# Patient Record
Sex: Male | Born: 1945 | Race: White | Hispanic: No | Marital: Single | Smoking: Former smoker
Health system: Southern US, Community
[De-identification: ages and names within clinical notes are randomized; demographics above are authoritative.]

## PROBLEM LIST (undated history)

## (undated) DIAGNOSIS — A498 Other bacterial infections of unspecified site: Secondary | ICD-10-CM

## (undated) DIAGNOSIS — I5030 Unspecified diastolic (congestive) heart failure: Secondary | ICD-10-CM

## (undated) DIAGNOSIS — F32A Depression, unspecified: Secondary | ICD-10-CM

## (undated) DIAGNOSIS — E785 Hyperlipidemia, unspecified: Secondary | ICD-10-CM

## (undated) DIAGNOSIS — E118 Type 2 diabetes mellitus with unspecified complications: Secondary | ICD-10-CM

## (undated) DIAGNOSIS — G4730 Sleep apnea, unspecified: Secondary | ICD-10-CM

## (undated) DIAGNOSIS — I5020 Unspecified systolic (congestive) heart failure: Secondary | ICD-10-CM

## (undated) DIAGNOSIS — R609 Edema, unspecified: Secondary | ICD-10-CM

## (undated) DIAGNOSIS — K2970 Gastritis, unspecified, without bleeding: Secondary | ICD-10-CM

## (undated) DIAGNOSIS — I2510 Atherosclerotic heart disease of native coronary artery without angina pectoris: Secondary | ICD-10-CM

## (undated) DIAGNOSIS — M4722 Other spondylosis with radiculopathy, cervical region: Secondary | ICD-10-CM

## (undated) DIAGNOSIS — F988 Other specified behavioral and emotional disorders with onset usually occurring in childhood and adolescence: Secondary | ICD-10-CM

## (undated) DIAGNOSIS — I951 Orthostatic hypotension: Secondary | ICD-10-CM

## (undated) DIAGNOSIS — K649 Unspecified hemorrhoids: Secondary | ICD-10-CM

## (undated) DIAGNOSIS — R911 Solitary pulmonary nodule: Secondary | ICD-10-CM

## (undated) DIAGNOSIS — R6882 Decreased libido: Secondary | ICD-10-CM

## (undated) DIAGNOSIS — G950 Syringomyelia and syringobulbia: Secondary | ICD-10-CM

## (undated) DIAGNOSIS — I10 Essential (primary) hypertension: Secondary | ICD-10-CM

## (undated) DIAGNOSIS — R000 Tachycardia, unspecified: Secondary | ICD-10-CM

## (undated) DIAGNOSIS — R600 Localized edema: Secondary | ICD-10-CM

## (undated) DIAGNOSIS — K529 Noninfective gastroenteritis and colitis, unspecified: Secondary | ICD-10-CM

## (undated) DIAGNOSIS — I452 Bifascicular block: Secondary | ICD-10-CM

## (undated) DIAGNOSIS — IMO0002 Reserved for concepts with insufficient information to code with codable children: Secondary | ICD-10-CM

## (undated) DIAGNOSIS — E1165 Type 2 diabetes mellitus with hyperglycemia: Secondary | ICD-10-CM

## (undated) DIAGNOSIS — F329 Major depressive disorder, single episode, unspecified: Secondary | ICD-10-CM

## (undated) DIAGNOSIS — G9520 Unspecified cord compression: Secondary | ICD-10-CM

## (undated) HISTORY — DX: Decreased libido: R68.82

## (undated) HISTORY — DX: Unspecified hemorrhoids: K64.9

## (undated) HISTORY — DX: Unspecified cord compression: G95.20

## (undated) HISTORY — DX: Noninfective gastroenteritis and colitis, unspecified: K52.9

## (undated) HISTORY — DX: Hyperlipidemia, unspecified: E78.5

## (undated) HISTORY — DX: Essential (primary) hypertension: I10

## (undated) HISTORY — DX: Gastritis, unspecified, without bleeding: K29.70

## (undated) HISTORY — DX: Other specified behavioral and emotional disorders with onset usually occurring in childhood and adolescence: F98.8

## (undated) HISTORY — DX: Tachycardia, unspecified: R00.0

## (undated) HISTORY — DX: Depression, unspecified: F32.A

## (undated) HISTORY — PX: CARDIAC CATHETERIZATION: SHX172

## (undated) HISTORY — DX: Major depressive disorder, single episode, unspecified: F32.9

## (undated) HISTORY — DX: Orthostatic hypotension: I95.1

## (undated) HISTORY — DX: Syringomyelia and syringobulbia: G95.0

## (undated) HISTORY — DX: Bifascicular block: I45.2

## (undated) HISTORY — DX: Other bacterial infections of unspecified site: A49.8

## (undated) HISTORY — DX: Other spondylosis with radiculopathy, cervical region: M47.22

---

## 1998-12-08 ENCOUNTER — Emergency Department (HOSPITAL_COMMUNITY): Admission: EM | Admit: 1998-12-08 | Discharge: 1998-12-08 | Payer: Self-pay | Admitting: Emergency Medicine

## 1998-12-09 ENCOUNTER — Encounter: Payer: Self-pay | Admitting: Emergency Medicine

## 2001-04-25 ENCOUNTER — Emergency Department (HOSPITAL_COMMUNITY): Admission: EM | Admit: 2001-04-25 | Discharge: 2001-04-26 | Payer: Self-pay | Admitting: Emergency Medicine

## 2001-04-27 ENCOUNTER — Emergency Department (HOSPITAL_COMMUNITY): Admission: EM | Admit: 2001-04-27 | Discharge: 2001-04-27 | Payer: Self-pay

## 2001-04-29 ENCOUNTER — Emergency Department (HOSPITAL_COMMUNITY): Admission: EM | Admit: 2001-04-29 | Discharge: 2001-04-29 | Payer: Self-pay | Admitting: Emergency Medicine

## 2001-05-03 ENCOUNTER — Emergency Department (HOSPITAL_COMMUNITY): Admission: EM | Admit: 2001-05-03 | Discharge: 2001-05-03 | Payer: Self-pay | Admitting: Emergency Medicine

## 2001-08-04 ENCOUNTER — Encounter: Payer: Self-pay | Admitting: Emergency Medicine

## 2001-08-04 ENCOUNTER — Emergency Department (HOSPITAL_COMMUNITY): Admission: EM | Admit: 2001-08-04 | Discharge: 2001-08-04 | Payer: Self-pay | Admitting: Emergency Medicine

## 2001-10-14 ENCOUNTER — Encounter: Payer: Self-pay | Admitting: Family Medicine

## 2001-10-14 ENCOUNTER — Encounter: Admission: RE | Admit: 2001-10-14 | Discharge: 2001-10-14 | Payer: Self-pay | Admitting: Family Medicine

## 2001-12-05 ENCOUNTER — Encounter: Admission: RE | Admit: 2001-12-05 | Discharge: 2001-12-05 | Payer: Self-pay | Admitting: Family Medicine

## 2001-12-05 ENCOUNTER — Encounter: Payer: Self-pay | Admitting: Family Medicine

## 2002-03-27 ENCOUNTER — Emergency Department (HOSPITAL_COMMUNITY): Admission: EM | Admit: 2002-03-27 | Discharge: 2002-03-27 | Payer: Self-pay | Admitting: *Deleted

## 2002-04-20 ENCOUNTER — Emergency Department (HOSPITAL_COMMUNITY): Admission: EM | Admit: 2002-04-20 | Discharge: 2002-04-20 | Payer: Self-pay | Admitting: Emergency Medicine

## 2002-11-08 ENCOUNTER — Emergency Department (HOSPITAL_COMMUNITY): Admission: EM | Admit: 2002-11-08 | Discharge: 2002-11-08 | Payer: Self-pay | Admitting: Emergency Medicine

## 2002-12-08 ENCOUNTER — Encounter: Admission: RE | Admit: 2002-12-08 | Discharge: 2002-12-08 | Payer: Self-pay | Admitting: Internal Medicine

## 2003-01-11 ENCOUNTER — Encounter: Admission: RE | Admit: 2003-01-11 | Discharge: 2003-01-11 | Payer: Self-pay | Admitting: Internal Medicine

## 2003-10-29 ENCOUNTER — Emergency Department (HOSPITAL_COMMUNITY): Admission: EM | Admit: 2003-10-29 | Discharge: 2003-10-29 | Payer: Self-pay | Admitting: Emergency Medicine

## 2003-11-07 ENCOUNTER — Ambulatory Visit (HOSPITAL_COMMUNITY): Admission: RE | Admit: 2003-11-07 | Discharge: 2003-11-07 | Payer: Self-pay | Admitting: Internal Medicine

## 2003-11-07 ENCOUNTER — Encounter: Admission: RE | Admit: 2003-11-07 | Discharge: 2003-11-07 | Payer: Self-pay | Admitting: Internal Medicine

## 2003-11-12 ENCOUNTER — Encounter: Admission: RE | Admit: 2003-11-12 | Discharge: 2003-11-12 | Payer: Self-pay | Admitting: Internal Medicine

## 2003-11-29 ENCOUNTER — Encounter: Admission: RE | Admit: 2003-11-29 | Discharge: 2003-11-29 | Payer: Self-pay | Admitting: Internal Medicine

## 2003-12-13 ENCOUNTER — Encounter: Admission: RE | Admit: 2003-12-13 | Discharge: 2003-12-13 | Payer: Self-pay | Admitting: Internal Medicine

## 2004-01-15 ENCOUNTER — Encounter: Admission: RE | Admit: 2004-01-15 | Discharge: 2004-01-15 | Payer: Self-pay | Admitting: Internal Medicine

## 2004-01-29 ENCOUNTER — Ambulatory Visit (HOSPITAL_COMMUNITY): Admission: RE | Admit: 2004-01-29 | Discharge: 2004-01-29 | Payer: Self-pay | Admitting: Internal Medicine

## 2004-01-30 ENCOUNTER — Ambulatory Visit (HOSPITAL_COMMUNITY): Admission: RE | Admit: 2004-01-30 | Discharge: 2004-01-30 | Payer: Self-pay | Admitting: Internal Medicine

## 2004-02-27 ENCOUNTER — Ambulatory Visit: Payer: Self-pay | Admitting: Internal Medicine

## 2004-02-28 LAB — FECAL OCCULT BLOOD, GUAIAC: Fecal Occult Blood: NEGATIVE

## 2004-03-06 ENCOUNTER — Ambulatory Visit: Payer: Self-pay | Admitting: Internal Medicine

## 2004-03-12 ENCOUNTER — Ambulatory Visit: Payer: Self-pay | Admitting: Internal Medicine

## 2004-03-14 ENCOUNTER — Ambulatory Visit (HOSPITAL_COMMUNITY): Admission: RE | Admit: 2004-03-14 | Discharge: 2004-03-14 | Payer: Self-pay | Admitting: Internal Medicine

## 2004-03-26 ENCOUNTER — Ambulatory Visit (HOSPITAL_COMMUNITY): Admission: RE | Admit: 2004-03-26 | Discharge: 2004-03-26 | Payer: Self-pay | Admitting: Internal Medicine

## 2004-04-10 ENCOUNTER — Encounter: Admission: RE | Admit: 2004-04-10 | Discharge: 2004-04-23 | Payer: Self-pay | Admitting: Internal Medicine

## 2004-04-11 ENCOUNTER — Ambulatory Visit: Payer: Self-pay | Admitting: Internal Medicine

## 2004-04-12 ENCOUNTER — Ambulatory Visit (HOSPITAL_COMMUNITY): Admission: RE | Admit: 2004-04-12 | Discharge: 2004-04-12 | Payer: Self-pay | Admitting: Internal Medicine

## 2004-04-18 ENCOUNTER — Encounter: Admission: RE | Admit: 2004-04-18 | Discharge: 2004-04-18 | Payer: Self-pay | Admitting: Internal Medicine

## 2004-05-06 ENCOUNTER — Ambulatory Visit: Payer: Self-pay | Admitting: Internal Medicine

## 2004-05-11 ENCOUNTER — Encounter: Admission: RE | Admit: 2004-05-11 | Discharge: 2004-05-11 | Payer: Self-pay | Admitting: Internal Medicine

## 2004-06-08 DIAGNOSIS — R000 Tachycardia, unspecified: Secondary | ICD-10-CM

## 2004-06-08 HISTORY — DX: Tachycardia, unspecified: R00.0

## 2004-06-16 ENCOUNTER — Ambulatory Visit: Payer: Self-pay | Admitting: Internal Medicine

## 2004-06-24 ENCOUNTER — Ambulatory Visit: Payer: Self-pay | Admitting: Internal Medicine

## 2004-08-20 ENCOUNTER — Ambulatory Visit: Payer: Self-pay | Admitting: Internal Medicine

## 2004-10-02 ENCOUNTER — Ambulatory Visit: Payer: Self-pay | Admitting: Internal Medicine

## 2004-10-14 ENCOUNTER — Ambulatory Visit (HOSPITAL_COMMUNITY): Admission: RE | Admit: 2004-10-14 | Discharge: 2004-10-14 | Payer: Self-pay | Admitting: Internal Medicine

## 2004-10-14 ENCOUNTER — Encounter (INDEPENDENT_AMBULATORY_CARE_PROVIDER_SITE_OTHER): Payer: Self-pay | Admitting: Cardiology

## 2004-10-16 ENCOUNTER — Ambulatory Visit: Payer: Self-pay | Admitting: Internal Medicine

## 2004-10-24 ENCOUNTER — Ambulatory Visit: Payer: Self-pay | Admitting: Cardiovascular Disease

## 2004-10-28 ENCOUNTER — Ambulatory Visit: Payer: Self-pay | Admitting: Cardiovascular Disease

## 2004-11-17 ENCOUNTER — Ambulatory Visit: Payer: Self-pay | Admitting: Cardiovascular Disease

## 2004-11-17 ENCOUNTER — Inpatient Hospital Stay (HOSPITAL_BASED_OUTPATIENT_CLINIC_OR_DEPARTMENT_OTHER): Admission: RE | Admit: 2004-11-17 | Discharge: 2004-11-17 | Payer: Self-pay | Admitting: Cardiovascular Disease

## 2004-12-08 ENCOUNTER — Ambulatory Visit: Payer: Self-pay

## 2005-01-21 ENCOUNTER — Ambulatory Visit: Payer: Self-pay | Admitting: Internal Medicine

## 2005-02-28 ENCOUNTER — Encounter (INDEPENDENT_AMBULATORY_CARE_PROVIDER_SITE_OTHER): Payer: Self-pay | Admitting: Internal Medicine

## 2005-03-03 ENCOUNTER — Ambulatory Visit: Payer: Self-pay | Admitting: Internal Medicine

## 2005-09-07 ENCOUNTER — Encounter (INDEPENDENT_AMBULATORY_CARE_PROVIDER_SITE_OTHER): Payer: Self-pay | Admitting: Internal Medicine

## 2005-09-07 ENCOUNTER — Ambulatory Visit: Payer: Self-pay | Admitting: Hospitalist

## 2005-10-15 ENCOUNTER — Ambulatory Visit: Payer: Self-pay | Admitting: Internal Medicine

## 2005-11-04 ENCOUNTER — Ambulatory Visit: Payer: Self-pay | Admitting: Internal Medicine

## 2005-12-24 ENCOUNTER — Ambulatory Visit (HOSPITAL_COMMUNITY): Admission: RE | Admit: 2005-12-24 | Discharge: 2005-12-24 | Payer: Self-pay | Admitting: Internal Medicine

## 2005-12-24 ENCOUNTER — Ambulatory Visit: Payer: Self-pay | Admitting: Internal Medicine

## 2006-03-01 ENCOUNTER — Ambulatory Visit: Payer: Self-pay | Admitting: Internal Medicine

## 2006-04-20 DIAGNOSIS — F329 Major depressive disorder, single episode, unspecified: Secondary | ICD-10-CM

## 2006-04-20 DIAGNOSIS — E785 Hyperlipidemia, unspecified: Secondary | ICD-10-CM

## 2006-04-20 DIAGNOSIS — E1165 Type 2 diabetes mellitus with hyperglycemia: Secondary | ICD-10-CM

## 2006-04-20 DIAGNOSIS — I10 Essential (primary) hypertension: Secondary | ICD-10-CM | POA: Insufficient documentation

## 2006-06-09 DIAGNOSIS — M5412 Radiculopathy, cervical region: Secondary | ICD-10-CM | POA: Insufficient documentation

## 2006-06-09 DIAGNOSIS — I452 Bifascicular block: Secondary | ICD-10-CM | POA: Insufficient documentation

## 2006-10-06 ENCOUNTER — Telehealth: Payer: Self-pay | Admitting: *Deleted

## 2006-10-06 ENCOUNTER — Encounter (INDEPENDENT_AMBULATORY_CARE_PROVIDER_SITE_OTHER): Payer: Self-pay | Admitting: Internal Medicine

## 2006-10-06 ENCOUNTER — Telehealth (INDEPENDENT_AMBULATORY_CARE_PROVIDER_SITE_OTHER): Payer: Self-pay | Admitting: *Deleted

## 2006-10-07 ENCOUNTER — Telehealth (INDEPENDENT_AMBULATORY_CARE_PROVIDER_SITE_OTHER): Payer: Self-pay | Admitting: *Deleted

## 2006-10-08 ENCOUNTER — Encounter (INDEPENDENT_AMBULATORY_CARE_PROVIDER_SITE_OTHER): Payer: Self-pay | Admitting: Internal Medicine

## 2006-10-08 ENCOUNTER — Ambulatory Visit: Payer: Self-pay | Admitting: *Deleted

## 2006-10-11 ENCOUNTER — Encounter (INDEPENDENT_AMBULATORY_CARE_PROVIDER_SITE_OTHER): Payer: Self-pay | Admitting: Internal Medicine

## 2006-10-14 DIAGNOSIS — R17 Unspecified jaundice: Secondary | ICD-10-CM | POA: Insufficient documentation

## 2006-10-14 LAB — CONVERTED CEMR LAB
ALT: 30 units/L (ref 0–53)
AST: 27 units/L (ref 0–37)
Albumin: 4.2 g/dL (ref 3.5–5.2)
Alkaline Phosphatase: 80 units/L (ref 39–117)
BUN: 16 mg/dL (ref 6–23)
Bilirubin, Direct: 0.3 mg/dL (ref 0.0–0.3)
CO2: 26 meq/L (ref 19–32)
Calcium: 9.7 mg/dL (ref 8.4–10.5)
Chloride: 101 meq/L (ref 96–112)
Cholesterol: 207 mg/dL — ABNORMAL HIGH (ref 0–200)
Creatinine, Ser: 0.82 mg/dL (ref 0.40–1.50)
Glucose, Bld: 245 mg/dL — ABNORMAL HIGH (ref 70–99)
HDL: 35 mg/dL — ABNORMAL LOW (ref >39)
Indirect Bilirubin: 1.7 mg/dL — ABNORMAL HIGH (ref 0.0–0.9)
LDL Cholesterol: 100 mg/dL — ABNORMAL HIGH (ref 0–99)
Potassium: 4.6 meq/L (ref 3.5–5.3)
Sodium: 140 meq/L (ref 135–145)
Total Bilirubin: 2 mg/dL — ABNORMAL HIGH (ref 0.3–1.2)
Total CHOL/HDL Ratio: 5.9
Total Protein: 6.9 g/dL (ref 6.0–8.3)
Triglycerides: 362 mg/dL — ABNORMAL HIGH (ref <150)
VLDL: 72 mg/dL — ABNORMAL HIGH (ref 0–40)

## 2006-10-22 ENCOUNTER — Ambulatory Visit: Payer: Self-pay | Admitting: Hospitalist

## 2006-10-25 ENCOUNTER — Telehealth (INDEPENDENT_AMBULATORY_CARE_PROVIDER_SITE_OTHER): Payer: Self-pay | Admitting: Internal Medicine

## 2006-11-03 ENCOUNTER — Telehealth: Payer: Self-pay | Admitting: *Deleted

## 2006-11-26 ENCOUNTER — Ambulatory Visit: Payer: Self-pay | Admitting: Internal Medicine

## 2006-11-26 ENCOUNTER — Encounter (INDEPENDENT_AMBULATORY_CARE_PROVIDER_SITE_OTHER): Payer: Self-pay | Admitting: Internal Medicine

## 2006-11-26 DIAGNOSIS — R6882 Decreased libido: Secondary | ICD-10-CM

## 2006-11-26 LAB — CONVERTED CEMR LAB
Blood Glucose, Fingerstick: 364
FSH: 5 milliintl units/mL (ref 1.4–18.1)
Hgb A1c MFr Bld: 10.4 %
LH: 8.5 milliintl units/mL (ref 1.5–9.3)
Testosterone: 354.55 ng/dL (ref 350–890)

## 2006-11-29 ENCOUNTER — Telehealth: Payer: Self-pay | Admitting: *Deleted

## 2006-12-20 ENCOUNTER — Ambulatory Visit: Payer: Self-pay | Admitting: *Deleted

## 2006-12-20 LAB — CONVERTED CEMR LAB: Blood Glucose, Fingerstick: 365

## 2007-02-15 ENCOUNTER — Ambulatory Visit: Payer: Self-pay | Admitting: Internal Medicine

## 2007-02-15 ENCOUNTER — Encounter (INDEPENDENT_AMBULATORY_CARE_PROVIDER_SITE_OTHER): Payer: Self-pay | Admitting: *Deleted

## 2007-02-15 LAB — CONVERTED CEMR LAB
Blood Glucose, Fingerstick: 367
Hgb A1c MFr Bld: 11.3 %

## 2007-02-16 LAB — CONVERTED CEMR LAB
BUN: 16 mg/dL (ref 6–23)
CO2: 24 meq/L (ref 19–32)
Calcium: 9.7 mg/dL (ref 8.4–10.5)
Chloride: 99 meq/L (ref 96–112)
Creatinine, Ser: 0.89 mg/dL (ref 0.40–1.50)
Creatinine, Urine: 166 mg/dL
Glucose, Bld: 322 mg/dL — ABNORMAL HIGH (ref 70–99)
Microalb Creat Ratio: 37.2 mg/g — ABNORMAL HIGH (ref 0.0–30.0)
Microalb, Ur: 6.17 mg/dL — ABNORMAL HIGH (ref 0.00–1.89)
Potassium: 4.5 meq/L (ref 3.5–5.3)
Sodium: 137 meq/L (ref 135–145)

## 2007-03-08 ENCOUNTER — Encounter (INDEPENDENT_AMBULATORY_CARE_PROVIDER_SITE_OTHER): Payer: Self-pay | Admitting: *Deleted

## 2007-03-08 ENCOUNTER — Ambulatory Visit: Payer: Self-pay | Admitting: Hospitalist

## 2007-03-08 LAB — CONVERTED CEMR LAB: Blood Glucose, Fingerstick: 294

## 2007-03-23 ENCOUNTER — Telehealth: Payer: Self-pay | Admitting: Internal Medicine

## 2007-03-30 ENCOUNTER — Telehealth (INDEPENDENT_AMBULATORY_CARE_PROVIDER_SITE_OTHER): Payer: Self-pay | Admitting: *Deleted

## 2007-06-23 ENCOUNTER — Ambulatory Visit: Payer: Self-pay | Admitting: Internal Medicine

## 2007-06-23 ENCOUNTER — Encounter (INDEPENDENT_AMBULATORY_CARE_PROVIDER_SITE_OTHER): Payer: Self-pay | Admitting: Internal Medicine

## 2007-06-23 LAB — CONVERTED CEMR LAB: Hgb A1c MFr Bld: 10.5 %

## 2007-06-27 ENCOUNTER — Telehealth: Payer: Self-pay | Admitting: *Deleted

## 2007-06-28 LAB — CONVERTED CEMR LAB
ALT: 32 units/L (ref 0–53)
AST: 21 units/L (ref 0–37)
Albumin: 4.3 g/dL (ref 3.5–5.2)
Alkaline Phosphatase: 95 units/L (ref 39–117)
BUN: 12 mg/dL (ref 6–23)
Basophils Absolute: 0 10*3/uL (ref 0.0–0.1)
Basophils Relative: 0 % (ref 0–1)
CO2: 27 meq/L (ref 19–32)
Calcium: 9.7 mg/dL (ref 8.4–10.5)
Chloride: 99 meq/L (ref 96–112)
Creatinine, Ser: 0.88 mg/dL (ref 0.40–1.50)
Eosinophils Absolute: 0.2 10*3/uL (ref 0.0–0.7)
Eosinophils Relative: 3 % (ref 0–5)
Glucose, Bld: 355 mg/dL — ABNORMAL HIGH (ref 70–99)
HCT: 49.7 % (ref 39.0–52.0)
Hemoglobin: 17.5 g/dL — ABNORMAL HIGH (ref 13.0–17.0)
Lymphocytes Relative: 30 % (ref 12–46)
Lymphs Abs: 2.1 10*3/uL (ref 0.7–4.0)
MCHC: 35.2 g/dL (ref 30.0–36.0)
MCV: 85.1 fL (ref 78.0–100.0)
Monocytes Absolute: 0.7 10*3/uL (ref 0.1–1.0)
Monocytes Relative: 10 % (ref 3–12)
Neutro Abs: 3.9 10*3/uL (ref 1.7–7.7)
Neutrophils Relative %: 57 % (ref 43–77)
PSA: 1.21 ng/mL (ref 0.10–4.00)
Platelets: 209 10*3/uL (ref 150–400)
Potassium: 4.8 meq/L (ref 3.5–5.3)
RBC: 5.84 M/uL — ABNORMAL HIGH (ref 4.22–5.81)
RDW: 13.7 % (ref 11.5–15.5)
Sodium: 137 meq/L (ref 135–145)
Total Bilirubin: 1.5 mg/dL — ABNORMAL HIGH (ref 0.3–1.2)
Total Protein: 7.1 g/dL (ref 6.0–8.3)
WBC: 6.9 10*3/uL (ref 4.0–10.5)

## 2007-07-06 ENCOUNTER — Telehealth (INDEPENDENT_AMBULATORY_CARE_PROVIDER_SITE_OTHER): Payer: Self-pay | Admitting: Internal Medicine

## 2007-09-12 ENCOUNTER — Telehealth: Payer: Self-pay | Admitting: *Deleted

## 2007-12-06 ENCOUNTER — Telehealth: Payer: Self-pay | Admitting: *Deleted

## 2008-01-27 ENCOUNTER — Encounter (INDEPENDENT_AMBULATORY_CARE_PROVIDER_SITE_OTHER): Payer: Self-pay | Admitting: Internal Medicine

## 2008-01-27 ENCOUNTER — Ambulatory Visit: Payer: Self-pay | Admitting: Internal Medicine

## 2008-01-27 LAB — CONVERTED CEMR LAB
Blood Glucose, Fingerstick: 412
Hgb A1c MFr Bld: 11.1 %

## 2008-02-06 ENCOUNTER — Telehealth (INDEPENDENT_AMBULATORY_CARE_PROVIDER_SITE_OTHER): Payer: Self-pay | Admitting: Internal Medicine

## 2008-02-07 ENCOUNTER — Telehealth (INDEPENDENT_AMBULATORY_CARE_PROVIDER_SITE_OTHER): Payer: Self-pay | Admitting: Internal Medicine

## 2008-02-08 ENCOUNTER — Ambulatory Visit: Payer: Self-pay | Admitting: Internal Medicine

## 2008-02-21 ENCOUNTER — Telehealth (INDEPENDENT_AMBULATORY_CARE_PROVIDER_SITE_OTHER): Payer: Self-pay | Admitting: Internal Medicine

## 2008-03-28 ENCOUNTER — Ambulatory Visit: Payer: Self-pay | Admitting: Internal Medicine

## 2008-03-28 ENCOUNTER — Encounter (INDEPENDENT_AMBULATORY_CARE_PROVIDER_SITE_OTHER): Payer: Self-pay | Admitting: Internal Medicine

## 2008-03-28 DIAGNOSIS — F988 Other specified behavioral and emotional disorders with onset usually occurring in childhood and adolescence: Secondary | ICD-10-CM | POA: Insufficient documentation

## 2008-03-28 LAB — CONVERTED CEMR LAB: Hgb A1c MFr Bld: 9.9 %

## 2008-03-30 LAB — CONVERTED CEMR LAB
ALT: 25 units/L (ref 0–53)
AST: 18 units/L (ref 0–37)
Albumin: 4 g/dL (ref 3.5–5.2)
Alkaline Phosphatase: 72 units/L (ref 39–117)
BUN: 15 mg/dL (ref 6–23)
CO2: 24 meq/L (ref 19–32)
Calcium: 9.1 mg/dL (ref 8.4–10.5)
Chloride: 104 meq/L (ref 96–112)
Creatinine, Ser: 0.74 mg/dL (ref 0.40–1.50)
Glucose, Bld: 254 mg/dL — ABNORMAL HIGH (ref 70–99)
HCT: 45.2 % (ref 39.0–52.0)
Hemoglobin: 15.8 g/dL (ref 13.0–17.0)
MCHC: 35 g/dL (ref 30.0–36.0)
MCV: 85.1 fL (ref 78.0–100.0)
Platelets: 188 10*3/uL (ref 150–400)
Potassium: 4.1 meq/L (ref 3.5–5.3)
RBC: 5.31 M/uL (ref 4.22–5.81)
RDW: 13.5 % (ref 11.5–15.5)
Sodium: 138 meq/L (ref 135–145)
Total Bilirubin: 1.5 mg/dL — ABNORMAL HIGH (ref 0.3–1.2)
Total Protein: 6.4 g/dL (ref 6.0–8.3)
Vit D, 1,25-Dihydroxy: 20 — ABNORMAL LOW (ref 30–89)
WBC: 6.7 10*3/uL (ref 4.0–10.5)

## 2008-04-09 ENCOUNTER — Encounter (INDEPENDENT_AMBULATORY_CARE_PROVIDER_SITE_OTHER): Payer: Self-pay | Admitting: Internal Medicine

## 2008-08-08 ENCOUNTER — Ambulatory Visit: Payer: Self-pay | Admitting: Internal Medicine

## 2008-08-10 ENCOUNTER — Encounter (INDEPENDENT_AMBULATORY_CARE_PROVIDER_SITE_OTHER): Payer: Self-pay | Admitting: Internal Medicine

## 2008-08-10 ENCOUNTER — Ambulatory Visit: Payer: Self-pay | Admitting: Internal Medicine

## 2008-08-10 DIAGNOSIS — J029 Acute pharyngitis, unspecified: Secondary | ICD-10-CM

## 2008-08-10 LAB — CONVERTED CEMR LAB
BUN: 16 mg/dL (ref 6–23)
CO2: 25 meq/L (ref 19–32)
Calcium: 9.7 mg/dL (ref 8.4–10.5)
Chloride: 103 meq/L (ref 96–112)
Creatinine, Ser: 0.74 mg/dL (ref 0.40–1.50)
Glucose, Bld: 297 mg/dL — ABNORMAL HIGH (ref 70–99)
Potassium: 4.6 meq/L (ref 3.5–5.3)
Sodium: 142 meq/L (ref 135–145)
Streptococcus, Group A Screen (Direct): NEGATIVE

## 2008-09-28 ENCOUNTER — Telehealth: Payer: Self-pay | Admitting: *Deleted

## 2008-10-03 ENCOUNTER — Encounter (INDEPENDENT_AMBULATORY_CARE_PROVIDER_SITE_OTHER): Payer: Self-pay | Admitting: Internal Medicine

## 2008-10-03 LAB — HM DIABETES EYE EXAM

## 2009-05-13 ENCOUNTER — Telehealth (INDEPENDENT_AMBULATORY_CARE_PROVIDER_SITE_OTHER): Payer: Self-pay | Admitting: *Deleted

## 2009-06-21 DIAGNOSIS — I951 Orthostatic hypotension: Secondary | ICD-10-CM

## 2009-06-21 DIAGNOSIS — I498 Other specified cardiac arrhythmias: Secondary | ICD-10-CM | POA: Insufficient documentation

## 2009-06-26 ENCOUNTER — Ambulatory Visit: Payer: Self-pay | Admitting: Internal Medicine

## 2009-07-09 ENCOUNTER — Ambulatory Visit: Payer: Self-pay | Admitting: Internal Medicine

## 2009-07-09 ENCOUNTER — Encounter: Payer: Self-pay | Admitting: Internal Medicine

## 2009-07-09 ENCOUNTER — Ambulatory Visit: Payer: Self-pay

## 2009-07-09 ENCOUNTER — Ambulatory Visit (HOSPITAL_COMMUNITY): Admission: RE | Admit: 2009-07-09 | Discharge: 2009-07-09 | Payer: Self-pay | Admitting: Internal Medicine

## 2009-07-11 LAB — CONVERTED CEMR LAB
Basophils Absolute: 0 10*3/uL (ref 0.0–0.1)
Basophils Relative: 0.7 % (ref 0.0–3.0)
Cholesterol: 190 mg/dL (ref 0–200)
Direct LDL: 96.5 mg/dL
Eosinophils Absolute: 0.2 10*3/uL (ref 0.0–0.7)
Eosinophils Relative: 4.8 % (ref 0.0–5.0)
HCT: 47.4 % (ref 39.0–52.0)
HDL: 39.7 mg/dL (ref >39.00)
Hemoglobin: 16.2 g/dL (ref 13.0–17.0)
Hgb A1c MFr Bld: 10.2 % — ABNORMAL HIGH (ref 4.6–6.5)
Lymphocytes Relative: 26.8 % (ref 12.0–46.0)
Lymphs Abs: 1.4 10*3/uL (ref 0.7–4.0)
MCHC: 34.2 g/dL (ref 30.0–36.0)
MCV: 87.3 fL (ref 78.0–100.0)
Monocytes Absolute: 0.4 10*3/uL (ref 0.1–1.0)
Monocytes Relative: 8.5 % (ref 3.0–12.0)
Neutro Abs: 3.2 10*3/uL (ref 1.4–7.7)
Neutrophils Relative %: 59.2 % (ref 43.0–77.0)
Platelets: 169 10*3/uL (ref 150.0–400.0)
RBC: 5.44 M/uL (ref 4.22–5.81)
RDW: 12.7 % (ref 11.5–14.6)
TSH: 1.35 microintl units/mL (ref 0.35–5.50)
Total CHOL/HDL Ratio: 5
Triglycerides: 355 mg/dL — ABNORMAL HIGH (ref 0.0–149.0)
VLDL: 71 mg/dL — ABNORMAL HIGH (ref 0.0–40.0)
WBC: 5.2 10*3/uL (ref 4.5–10.5)

## 2009-08-22 ENCOUNTER — Telehealth (INDEPENDENT_AMBULATORY_CARE_PROVIDER_SITE_OTHER): Payer: Self-pay | Admitting: *Deleted

## 2009-09-18 ENCOUNTER — Telehealth: Payer: Self-pay | Admitting: Internal Medicine

## 2009-09-23 ENCOUNTER — Ambulatory Visit: Payer: Self-pay | Admitting: Internal Medicine

## 2009-09-23 LAB — CONVERTED CEMR LAB

## 2009-10-29 ENCOUNTER — Ambulatory Visit: Payer: Self-pay | Admitting: Infectious Disease

## 2009-10-29 DIAGNOSIS — G4700 Insomnia, unspecified: Secondary | ICD-10-CM

## 2009-10-29 LAB — CONVERTED CEMR LAB
ALT: 17 U/L
AST: 16 U/L
Albumin: 4.1 g/dL
Alkaline Phosphatase: 75 U/L
BUN: 19 mg/dL
Bilirubin, Direct: 0.2 mg/dL
Blood Glucose, Fingerstick: 434
CO2: 25 meq/L
Calcium: 10.3 mg/dL
Chloride: 97 meq/L
Creatinine, Ser: 1.02 mg/dL
Creatinine, Urine: 76.9 mg/dL
Glucose, Bld: 353 mg/dL — ABNORMAL HIGH
Hgb A1c MFr Bld: 10.4 %
Indirect Bilirubin: 1.8 mg/dL — ABNORMAL HIGH
Microalb Creat Ratio: 105.2 mg/g — ABNORMAL HIGH
Microalb, Ur: 8.09 mg/dL — ABNORMAL HIGH
PSA: 1.66 ng/mL
Potassium: 4.5 meq/L
Sodium: 136 meq/L
Total Bilirubin: 2 mg/dL — ABNORMAL HIGH
Total Protein: 6.7 g/dL

## 2009-10-31 ENCOUNTER — Telehealth: Payer: Self-pay | Admitting: Internal Medicine

## 2009-11-27 ENCOUNTER — Ambulatory Visit: Payer: Self-pay | Admitting: Internal Medicine

## 2009-11-27 LAB — CONVERTED CEMR LAB: Blood Glucose, Fingerstick: 455

## 2009-12-03 ENCOUNTER — Telehealth: Payer: Self-pay | Admitting: Internal Medicine

## 2009-12-19 ENCOUNTER — Telehealth: Payer: Self-pay | Admitting: Internal Medicine

## 2009-12-23 ENCOUNTER — Telehealth: Payer: Self-pay | Admitting: Internal Medicine

## 2009-12-23 ENCOUNTER — Ambulatory Visit: Payer: Self-pay | Admitting: Internal Medicine

## 2009-12-23 LAB — CONVERTED CEMR LAB: Blood Glucose, Fingerstick: 409

## 2009-12-24 ENCOUNTER — Telehealth (INDEPENDENT_AMBULATORY_CARE_PROVIDER_SITE_OTHER): Payer: Self-pay | Admitting: *Deleted

## 2009-12-26 ENCOUNTER — Telehealth (INDEPENDENT_AMBULATORY_CARE_PROVIDER_SITE_OTHER): Payer: Self-pay | Admitting: *Deleted

## 2009-12-26 ENCOUNTER — Telehealth: Payer: Self-pay | Admitting: Internal Medicine

## 2009-12-30 ENCOUNTER — Ambulatory Visit: Payer: Self-pay | Admitting: Internal Medicine

## 2010-01-06 ENCOUNTER — Encounter: Payer: Self-pay | Admitting: Internal Medicine

## 2010-01-10 ENCOUNTER — Telehealth: Payer: Self-pay | Admitting: Internal Medicine

## 2010-01-15 ENCOUNTER — Encounter (INDEPENDENT_AMBULATORY_CARE_PROVIDER_SITE_OTHER): Payer: Self-pay | Admitting: *Deleted

## 2010-01-15 ENCOUNTER — Ambulatory Visit: Payer: Self-pay | Admitting: Internal Medicine

## 2010-01-22 ENCOUNTER — Telehealth (INDEPENDENT_AMBULATORY_CARE_PROVIDER_SITE_OTHER): Payer: Self-pay | Admitting: *Deleted

## 2010-02-04 ENCOUNTER — Telehealth (INDEPENDENT_AMBULATORY_CARE_PROVIDER_SITE_OTHER): Payer: Self-pay | Admitting: *Deleted

## 2010-02-11 ENCOUNTER — Telehealth: Payer: Self-pay | Admitting: Internal Medicine

## 2010-02-11 DIAGNOSIS — Z87891 Personal history of nicotine dependence: Secondary | ICD-10-CM

## 2010-03-26 ENCOUNTER — Encounter: Payer: Self-pay | Admitting: Internal Medicine

## 2010-03-26 ENCOUNTER — Ambulatory Visit (HOSPITAL_COMMUNITY)
Admission: RE | Admit: 2010-03-26 | Discharge: 2010-03-26 | Payer: Self-pay | Source: Home / Self Care | Admitting: Internal Medicine

## 2010-04-09 ENCOUNTER — Ambulatory Visit: Payer: Self-pay | Admitting: Critical Care Medicine

## 2010-04-09 ENCOUNTER — Encounter: Payer: Self-pay | Admitting: Critical Care Medicine

## 2010-04-28 ENCOUNTER — Ambulatory Visit: Payer: Self-pay | Admitting: Internal Medicine

## 2010-04-28 ENCOUNTER — Telehealth (INDEPENDENT_AMBULATORY_CARE_PROVIDER_SITE_OTHER): Payer: Self-pay | Admitting: *Deleted

## 2010-04-28 LAB — CONVERTED CEMR LAB: Hgb A1c MFr Bld: 10.3 %

## 2010-05-27 ENCOUNTER — Encounter: Payer: Self-pay | Admitting: Internal Medicine

## 2010-05-27 ENCOUNTER — Emergency Department (HOSPITAL_COMMUNITY)
Admission: EM | Admit: 2010-05-27 | Discharge: 2010-05-27 | Payer: Self-pay | Source: Home / Self Care | Admitting: Emergency Medicine

## 2010-05-28 ENCOUNTER — Telehealth: Payer: Self-pay | Admitting: Internal Medicine

## 2010-05-29 ENCOUNTER — Observation Stay (HOSPITAL_COMMUNITY)
Admission: EM | Admit: 2010-05-29 | Discharge: 2010-05-30 | Payer: Self-pay | Source: Home / Self Care | Attending: Internal Medicine | Admitting: Internal Medicine

## 2010-05-29 ENCOUNTER — Telehealth: Payer: Self-pay | Admitting: *Deleted

## 2010-05-29 ENCOUNTER — Encounter: Payer: Self-pay | Admitting: Internal Medicine

## 2010-05-30 ENCOUNTER — Encounter: Payer: Self-pay | Admitting: Internal Medicine

## 2010-05-30 DIAGNOSIS — G7000 Myasthenia gravis without (acute) exacerbation: Secondary | ICD-10-CM

## 2010-06-05 ENCOUNTER — Ambulatory Visit: Admission: RE | Admit: 2010-06-05 | Discharge: 2010-06-05 | Payer: Self-pay | Source: Home / Self Care

## 2010-06-05 ENCOUNTER — Encounter: Payer: Self-pay | Admitting: Internal Medicine

## 2010-06-05 ENCOUNTER — Ambulatory Visit: Admit: 2010-06-05 | Payer: Self-pay

## 2010-06-05 LAB — CONVERTED CEMR LAB
CO2: 25 meq/L (ref 19–32)
Calcium: 9.9 mg/dL (ref 8.4–10.5)
Chloride: 101 meq/L (ref 96–112)
Glucose, Bld: 278 mg/dL — ABNORMAL HIGH (ref 70–99)
Sodium: 139 meq/L (ref 135–145)
Total Bilirubin: 1.6 mg/dL — ABNORMAL HIGH (ref 0.3–1.2)
Total Protein: 6.8 g/dL (ref 6.0–8.3)

## 2010-06-05 LAB — HM DIABETES FOOT EXAM

## 2010-06-12 ENCOUNTER — Encounter: Payer: Self-pay | Admitting: Internal Medicine

## 2010-06-12 ENCOUNTER — Ambulatory Visit
Admission: RE | Admit: 2010-06-12 | Discharge: 2010-06-12 | Payer: Self-pay | Source: Home / Self Care | Attending: Internal Medicine | Admitting: Internal Medicine

## 2010-06-13 ENCOUNTER — Telehealth: Payer: Self-pay | Admitting: Internal Medicine

## 2010-06-16 ENCOUNTER — Telehealth (INDEPENDENT_AMBULATORY_CARE_PROVIDER_SITE_OTHER): Payer: Self-pay | Admitting: *Deleted

## 2010-06-18 ENCOUNTER — Ambulatory Visit
Admission: RE | Admit: 2010-06-18 | Discharge: 2010-06-18 | Payer: Self-pay | Source: Home / Self Care | Attending: Critical Care Medicine | Admitting: Critical Care Medicine

## 2010-06-18 DIAGNOSIS — J449 Chronic obstructive pulmonary disease, unspecified: Secondary | ICD-10-CM | POA: Insufficient documentation

## 2010-06-18 DIAGNOSIS — J4489 Other specified chronic obstructive pulmonary disease: Secondary | ICD-10-CM | POA: Insufficient documentation

## 2010-06-18 DIAGNOSIS — J984 Other disorders of lung: Secondary | ICD-10-CM | POA: Insufficient documentation

## 2010-06-20 ENCOUNTER — Ambulatory Visit
Admission: RE | Admit: 2010-06-20 | Discharge: 2010-06-20 | Payer: Self-pay | Source: Home / Self Care | Attending: Cardiothoracic Surgery | Admitting: Cardiothoracic Surgery

## 2010-06-28 ENCOUNTER — Encounter: Payer: Self-pay | Admitting: Internal Medicine

## 2010-06-29 ENCOUNTER — Encounter: Payer: Self-pay | Admitting: Internal Medicine

## 2010-07-08 NOTE — Progress Notes (Signed)
Summary: Testing  Phone Note Call from Patient   Caller: Patient Call For: Danny Maffucci MD Summary of Call: Call from [facility].  Pt said that he is having problems getting blood from his fingers with the present sysytem that he has.  Pt would like to do testing on his forearm instead.   Pt is testing twice daily.  Pt has an appointment this afternoon with D. Victory Dakin.  Will need teaching on how to get a specimen.  Pt tried at the store to get a glucose with the machine -1 Touch Ultra.  Wants to try something that he will be able to use his arm instead of his fingers.  Return call to pharmacy they will hold the prescriptin for the new device and pt plans to come to meet with Roosvelt Maser this PM. Angelina Ok RN  December 23, 2009 11:59 AM  Initial call taken by: Angelina Ok RN,  December 23, 2009 11:53 AM  Follow-up for Phone Call        Agree with plan. Follow-up by: Julaine Fusi  DO,  December 23, 2009 1:22 PM

## 2010-07-08 NOTE — Progress Notes (Signed)
Summary: wants [facility] as dr.  Phone Note Call from Patient Call back at Home Phone [phone]   Caller: Patient Call For: wright Reason for Call: Talk to Nurse Summary of Call: this is [name]'s son and wants to be your patient.  He doesn't have any pulmonary problems but the doctor he saw at [facility] refused to let him have a cxr when he had a physical.  He wants to be your patient, in case he needs you.  [facility] has come back and offered him the cxr, but he prefers to deal with you instead.  I told him I would send you a message for him, but that you were a pulmonary doctor and he had no pulmonary problems.  Also he says he smoked for 30 years before stopping 20 years ago. Initial call taken by: Eugene Gavia,  January 22, 2010 11:28 AM  Follow-up for Phone Call        Spoke with pt-understands that PW will not see for Primary issues but if pt is having pulmonary issues then he can call and schedule an appt with PW. Reynaldo Minium CMA  January 22, 2010 11:46 AM

## 2010-07-08 NOTE — Progress Notes (Signed)
Summary: medchange/gp  Phone Note Refill Request Message from:  Fax from Pharmacy on December 26, 2009 12:25 PM  Refills Requested: Medication #1:  LANCETS ULTRA THIN 30G  MISC use to check blood sugar up to 1x a day (250.00). Insurance will not pay fort Generic Lancets;  pt. would like Freestyle Lancets Qty #100.   Method Requested: Electronic Initial call taken by: Chinita Pester RN,  December 26, 2009 12:25 PM  Follow-up for Phone Call        Refill approved Follow-up by: Darnelle Maffucci MD,  December 30, 2009 11:57 AM    New/Updated Medications: FREESTYLE LANCETS  MISC (LANCETS) as instructed Prescriptions: FREESTYLE LANCETS  MISC (LANCETS) as instructed  #100 x 6   Entered and Authorized by:   Darnelle Maffucci MD   Signed by:   Darnelle Maffucci MD on 12/30/2009   Method used:   Electronically to        [facility] # [address] (retail)       [address]       [place], [place]  [address]       Ph: [phone]       Fax: [phone]   RxID:   [id number]

## 2010-07-08 NOTE — Letter (Signed)
Summary: Generic Letter  [facility]  [address]   [place], [place] [address]   Phone: [phone]  Fax: [phone]    03/26/2010  [name] [address] [place], [place]  [address]  Dear Mr. Lepak,   I have reviewed your chest X ray results. Other than some mild peribronchial thickening that is likely chronic, it was normal.     Sincerely,      Blanch Media MD

## 2010-07-08 NOTE — Assessment & Plan Note (Signed)
Summary: rov/amber  Medications Added CIALIS 5 MG TABS (TADALAFIL) take one pill as needed for planned sexual function within the next 12 hours/HOLD METFORMIN HCL 1000 MG TABS (METFORMIN HCL) take one tablet two times a day CARDIZEM CD 120 MG XR24H-CAP (DILTIAZEM HCL COATED BEADS) once daily      Allergies Added: NKDA  Primary Provider:  Dr. Joaquin Courts   CC:  rov/ pt reports that his heart is fine.  However pt got new medication to control rate.  Then for the ED he had a problem with Cialis.  He now has insurance.Marland Kitchen  History of Present Illness:   [name] is seen in followup for sinus tachycardia and orthostatic intolerance in the context of diabetes and hypertension;  He also has chest pain with an angiogram in 2006 which demonstrated normal coronary arteries .  he previously had a normal TSH.  We used low-dose beta blockade which had a beneficial impact on slowing his heart rate. He has stopped this because of erectile dysfunction.  He has seen a primary physician who has given him cialis  He denies chest pain or dyspnea on exertion or edema.      Current Medications (verified): 1)  Glipizide 10 Mg Tabs (Glipizide) .... Take One Tablet By Mouth Two Times A Day For Diabetes 2)  Benazepril Hcl 40 Mg Tabs (Benazepril Hcl) .... Take 1 Tablet By Mouth Once A Day For Blood Pressure 3)  Mega Multi Men  Tbcr (Multiple Vitamins-Minerals) .... Take 1 Tablet By Mouth Once A Day 4)  Cialis 5 Mg Tabs (Tadalafil) .... Take One Pill As Needed For Planned Sexual Function Within The Next 12 Hours/hold 5)  Metformin Hcl 1000 Mg Tabs (Metformin Hcl) .... 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 Prefers to see only male physicians. Widowed   Vital Signs:  Patient profile:   65 year old male Height:      67 inches Weight:      205 pounds BMI:     32.22 Pulse rate:   110 / minute Pulse rhythm:   regular BP sitting:   126 / 80  (right arm) Cuff size:   regular  Vitals Entered By: Judithe Modest CMA (June 26, 2009 9:52 AM)  Physical Exam  General:  The patient was alert and oriented in no acute distress. HEENT Normal.  Neck veins were flat, carotids were brisk.  Lungs were clear.  Heart sounds were regular but raqpid without murmurs or gallops. pmi NON displaced Abdomen was soft with active bowel sounds. There is no clubbing cyanosis or edema. Skin Warm and dry    Impression & Recommendations:  Problem # 1:  SINUS TACHYCARDIA (ICD-427.89) Assessment Improved we will try cardiazem and check an echo looking for lv dysfunction  His updated medication list for this problem includes:    Benazepril Hcl 40 Mg  Tabs (Benazepril hcl) .Marland Kitchen... Take 1 tablet by mouth once a day for blood pressure    Cardizem Cd 120 Mg Xr24h-cap (Diltiazem hcl coated beads) ..... Once daily  Orders: Echocardiogram (Echo)  Problem # 2:  HYPERTENSION (ICD-401.9)  continue current meds His updated medication list for this problem includes:    Benazepril Hcl 40 Mg Tabs (Benazepril hcl) .Marland Kitchen...  Take 1 tablet by mouth once a day for blood pressure    Cardizem Cd 120 Mg Xr24h-cap (Diltiazem hcl coated beads) ..... Once daily  His updated medication list for this problem includes:    Benazepril Hcl 40 Mg Tabs (Benazepril hcl) .Marland Kitchen... Take 1 tablet by mouth once a day for blood pressure    Cardizem Cd 120 Mg Xr24h-cap (Diltiazem hcl coated beads) ..... 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 Take 1 tablet by mouth once a day for blood pressure    Cardizem Cd 120 Mg Xr24h-cap (Diltiazem hcl coated beads) ..... Once daily  His updated medication list for this problem includes:    Benazepril Hcl 40 Mg Tabs (Benazepril hcl) .Marland Kitchen... Take 1 tablet by mouth once a day for blood pressure    Cardizem Cd 120 Mg Xr24h-cap (Diltiazem hcl coated beads) ..... Once daily  Other Orders: EKG w/ Interpretation (93000) Holter (Holter)  Patient Instructions: 1)  Your physician recommends that you return for lab WHEN YOU HAVE YOUR [place]: CBC, TSH, FASTING LIPIDS, HGB A1-C 2)  Your physician has requested that you have an echocardiogram.  Echocardiography is a painless test that uses sound waves to create images of your heart. It provides your doctor with information about the size and shape of your heart and how well your heart's chambers and valves are working.  This procedure takes approximately one hour. There are no restrictions for this procedure. 3)  Your physician has recommended that you wear a holter monitor IN 2 WEEKS.  Holter monitors are medical devices that record the heart's electrical activity. Doctors most often use these monitors to diagnose arrhythmias. Arrhythmias are problems with the speed or rhythm of the heartbeat. The monitor is a small, portable device. You can wear one while you do your normal daily activities.  This is usually used to diagnose what is causing palpitations/syncope (passing out). 4)  Your physician has recommended you make the following change in your medication: START CARDIZEM 120MG  DAILY Prescriptions: CARDIZEM CD 120 MG XR24H-CAP (DILTIAZEM HCL COATED BEADS) once daily  #30 x 11   Entered by:   Duncan Dull, RN, BSN   Authorized by:   Nathen May, MD, [facility]   Signed by:   Duncan Dull, RN, BSN on 06/26/2009   Method used:   Electronically to        [facility] # [address] (retail)       [address]       [place], [place]  [address]       Ph: [phone]       Fax: [phone]   RxID:   [id number]

## 2010-07-08 NOTE — Progress Notes (Signed)
Summary: diabetes testing supply prescriptions/dmr  Phone Note Outgoing Call   Call placed by: Jamison Neighbor RD,CDE,  December 23, 2009 4:41 PM Summary of Call: needs new prescription for new meter- he did not like the one touch for alternative site testing.    Prescriptions: LANCETS ULTRA THIN 30G  MISC (LANCETS) use to check blood sugar up to 1x a day (250.00)  #100 x 11   Entered by:   Jamison Neighbor RD,CDE   Authorized by:   Darnelle Maffucci MD   Signed by:   Darnelle Maffucci MD on 12/23/2009   Method used:   Electronically to        [facility] # [id number] (retail)       [address]       [place], [place]  [address]       Ph: [phone]       Fax: [phone]   RxID:   [id number] FREESTYLE LITE TEST  STRP (GLUCOSE BLOOD) use to test blood sugar up to 3x a day for the next week, then decrease to up to 1x/day (250.00)  #100 x 11   Entered by:   Jamison Neighbor RD,CDE   Authorized by:   Darnelle Maffucci MD   Signed by:   Darnelle Maffucci MD on 12/23/2009   Method used:   Electronically to        [facility] # [id number] (retail)       [address]       [place], [place]  [address]       Ph: [phone]       Fax: [phone]   RxID:   [id number]

## 2010-07-08 NOTE — Letter (Signed)
Summary: TWO WEEK GLUCOSE   TWO WEEK GLUCOSE   Imported By: Margie Billet 02/05/2010 15:49:47  _____________________________________________________________________  External Attachment:    Type:   Image     Comment:   External Document

## 2010-07-08 NOTE — Progress Notes (Signed)
Summary: PREVENTIVE COLONOSCOPY  Phone Note Outgoing Call   Summary of Call: Patient was recently ref/to Dr. Donavan Burnet office for a Colonoscopy screening.  He has some Insurance questions pending with this office.  Not sure waht is going on.  Can not find any info on his last colonoscopy. Patient should be eligible for medicare starting next month.  Please update any information regarding his last colonoscopy screening. Initial call taken by: Shon Hough,  April 28, 2010 9:21 AM

## 2010-07-08 NOTE — Progress Notes (Signed)
  Phone Note Outgoing Call   Call placed by: Blanch Media MD,  January 10, 2010 9:47 AM Call placed to: Patient Summary of Call: I tried to call patient 01/09/10 at about 4PM and got answering machine - left message. Called 01/10/10 at 9:45 AM and got machine - left message. Will cont to try. Tried 1PM - got answering machine. Didn't leave another message.     Appended Document:   I meet with Mr Mohrmann after his appt with Lupita Leash. He expressed concern over current provider and didn't feel the arrangement was a good fit. He requested a male physician and a "supervisor". He no longer desires a CXR. He does however wish for a longer refill on his zolpidem. I talked to [facility] and will ask Dr Meredith Pel if he will take on Mr Arp - Dr Meredith Pel will review his chart and see if the change is appropriate.  Clinical Lists Changes  Medications: Rx of ZOLPIDEM TARTRATE 10 MG TABS (ZOLPIDEM TARTRATE) Take 1 tablet by mouth once a day at bedtime as needed for insomnia.;  #30 x 5;  Signed;  Entered by: Blanch Media MD;  Authorized by: Blanch Media MD;  Method used: Telephoned to [facility] # [address] [place], [place]  [address], Ph: [phone], Fax: [phone]    Prescriptions: ZOLPIDEM TARTRATE 10 MG TABS (ZOLPIDEM TARTRATE) Take 1 tablet by mouth once a day at bedtime as needed for insomnia.  #30 x 5   Entered and Authorized by:   Blanch Media MD   Signed by:   Blanch Media MD on 01/15/2010   Method used:   Telephoned to ...       [facility] # [address]* (retail)       [address]       [place], [place]  [address]       Ph: [phone]       Fax: [phone]   RxID:   [id number]   [facility] Merrie Roof RN  January 16, 2010 10:23 AM

## 2010-07-08 NOTE — Progress Notes (Signed)
Summary: diabetes support/dmr  Phone Note Outgoing Call   Call placed by: Jamison Neighbor RD,CDE,  December 26, 2009 2:21 PM Summary of Call: called to follow up on self monitoring- patient reports he is doing well with alternatice site self monitoring and is changing his diet as a result: CBGs 2 hours after eating meals: day 1: [phone] Day 2: 301.425 (says he  learned he cannot eat 2 english muffins), 273/259 has not tested  today or taken any meds. He says he is writing down everything he eats. we discussed identifying how a food will affect blood sugar by readings labels for carbs. WE also discussed impact of physical activity on lowering CBgs.  Patient verbalized understanding.

## 2010-07-08 NOTE — Assessment & Plan Note (Signed)
Summary: Pulmonary Consultation   Copy to:  self referral Primary Provider/Referring Provider:  [facility]  CC:  Pulmonary Consutl.  History of Present Illness: Pulmonary Consultation 65 yo WM referred for abn CXR.       This is a 65 year old male who presents with an abnormal radiology finding.  The patient complains of cough, but denies history of diagnosed COPD, shortness of breath, chest tightness, chest pain worse with breathing and coughing, wheezing, mucous production, nocturnal awakening, exercise induced symptoms, and congestion.    This pt has a hx of mild throat clearing and 30ys of smoking and quit smoking  1993.  Pt desired a CXR per PCP and was obtained .This revealed peribronchial thickening and  pt referred to pulm for eval. Pt denies any dyspnea or chest pain.  There is not any mucus or excess wheeze.  The pt has no specific pulm symptoms at this time.  Pt denies any significant sore throat, nasal congestion or excess secretions, fever, chills, sweats, unintended weight loss, pleurtic or exertional chest pain, orthopnea PND, or leg swelling Pt denies any increase in rescue therapy over baseline, denies waking up needing it or having any early am or nocturnal exacerbations of coughing/wheezing/or dyspnea. Pt also denies any obvious fluctuation in symptoms wiht weather or environmental change or other alleviating or aggravating factors  Spirometry obtained today and is normal.  Preventive Screening-Counseling & Management  Alcohol-Tobacco     Year Started: 1962     Year Quit: 1993     Pack years: 80  Current Medications (verified): 1)  Glipizide 10 Mg Tabs (Glipizide) .... Take One Tablet By Mouth Two Times A Day For Diabetes 2)  Benazepril Hcl 40 Mg Tabs (Benazepril Hcl) .... Take 1 Tablet By Mouth Once A Day For Blood Pressure 3)  Mega Multi Men  Tbcr (Multiple Vitamins-Minerals) .... Take 1 Tablet By Mouth Once A Day 4)  Cialis 5 Mg Tabs (Tadalafil) .... Take One  Pill As Needed For Planned Sexual Function Within The Next 12 Hours/hold 5)  Metformin Hcl 1000 Mg Tabs (Metformin Hcl) .... Take One Tablet Two Times A Day 6)  Cardizem Cd 120 Mg Xr24h-Cap (Diltiazem Hcl Coated Beads) .... Once Daily 7)  Zolpidem Tartrate 10 Mg Tabs (Zolpidem Tartrate) .... Take 1 Tablet By Mouth Once A Day At Bedtime For Insomnia. 8)  Freestyle Lite Test  Strp (Glucose Blood) .... Use To Test Blood Sugar Up To 3x A Day For The Next Week, Then Decrease To Up To 1x/day (250.00) 9)  Freestyle Lancets  Misc (Lancets) .... As Instructed 10)  Pen Needles 31g X 6 Mm Misc (Insulin Pen Needle) .... Use As Instructed 11)  Saw Palmetto .... Two Times A Day  Allergies (verified): No Known Drug Allergies  Past History:  Past medical, surgical, family and social histories (including risk factors) reviewed, and no changes noted (except as noted below).  Past Medical History: Reviewed history from 06/21/2009 and no changes required. 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 mother - hx of breast ca father deceased from beck's  sarcoid  Social History: Reviewed history from 06/21/2009 and no changes required. Former Smoker.  Quit in 1993.  2 - 2 1/2 x 30 yrs Has a single man's lifestyle and will remind anyone of it when  asked. Prefers to see only male physicians. 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  The patient denies shortness of breath with activity, shortness of breath at rest, productive cough, non-productive cough, coughing up blood, chest pain, irregular heartbeats, indigestion, loss of appetite, weight change, abdominal pain, tooth/dental problems, headaches, sneezing, itching, ear ache, anxiety, depression, hand/feet swelling, rash, change in color of mucus, and fever.        See HPI for Pulmonary, Cardiac, General, and ENT review of systems.  Vital Signs:  Patient profile:   65 year old male Height:      67 inches Weight:      205 pounds BMI:     32.22 O2 Sat:      97 % on Room air Temp:     98.1 degrees F oral Pulse rate:   106 / minute BP sitting:   180 / 92  (left arm) Cuff size:   large  Vitals Entered By: Gweneth Dimitri RN (April 09, 2010 10:16 AM)  Nutrition Counseling: Patient's BMI is greater than 25 and therefore counseled on weight management options.  O2 Flow:  Room air  Physical Exam  Additional Exam:  Gen: Pleasant, well-nourished, in no distress,  normal affect ENT: No lesions,  mouth clear,  oropharynx clear, no postnasal drip Neck: No JVD, no TMG, no carotid bruits Lungs: No use of accessory muscles, no dullness to percussion, clear without rales or rhonchi Cardiovascular: RRR, heart sounds normal, no murmur or gallops, no peripheral edema Abdomen: soft and NT, no HSM,  BS normal Musculoskeletal: No deformities, no cyanosis or clubbing Neuro: alert, non focal Skin: Warm, no lesions or rashes    CXR  Procedure date:  03/26/2010  Findings:      IMPRESSION: Mild peribronchial thickening - likely chronic.  Impression & Recommendations:  Problem # 1:  ABNORMAL CHEST XRAY  (ICD-793.1) Assessment Unchanged Chest xray demonstrates very mild peribronchial thickening but the patient is asymptomatic.  I suspect this is from smoking history but no specific treatment is indicated  plan  no further pulmonary f/u needed spirometry was normal no medication changes needed Orders: New Patient Level V ([address])  Medications Added to Medication List This Visit: 1)  Zolpidem Tartrate 10 Mg Tabs (Zolpidem tartrate) .... Take 1 tablet by mouth once a day at bedtime for insomnia. 2)  Saw Palmetto  .... Two times a day  Complete Medication List: 1)  Glipizide 10 Mg Tabs (Glipizide) .... Take one tablet by mouth two times a day for diabetes 2)  Benazepril Hcl 40 Mg Tabs (Benazepril hcl) .... Take 1 tablet by mouth once a day for blood pressure 3)  Mega Multi Men Tbcr (Multiple vitamins-minerals) .... Take 1 tablet by mouth once a day 4)  Cialis 5 Mg Tabs (Tadalafil) .... Take one pill as needed for planned sexual function within the next 12 hours/hold 5)  Metformin Hcl 1000 Mg Tabs (Metformin hcl) .... Take one tablet two times a day 6)  Cardizem Cd 120 Mg Xr24h-cap (Diltiazem hcl coated beads) .... Once daily 7)  Zolpidem Tartrate 10 Mg  Tabs (Zolpidem tartrate) .... Take 1 tablet by mouth once a day at bedtime for insomnia. 8)  Freestyle Lite Test Strp (Glucose blood) .... Use to test blood sugar up to 3x a day for the next week, then decrease to up to 1x/day (250.00) 9)  Freestyle Lancets Misc (Lancets) .... As instructed 10)  Pen Needles 31g X 6 Mm Misc (Insulin pen needle) .... Use as instructed 11)  Saw Palmetto  .... Two times a day  Other Orders: Spirometry w/Graph (94[address])  Patient Instructions: 1)  No change in medications recommended 2)  Return to pulmonary as needed

## 2010-07-08 NOTE — Assessment & Plan Note (Signed)
Summary: DM TEACHING/DS  Is Patient Diabetic? Yes Did you bring your meter with you today? No CBG Result 409 CBG Device ID Freestyle meter on arm Comments patient ate on way to clinic pastrami sandwich and oatmeal raisin cookie with diet soda   Allergies: No Known Drug Allergies   Complete Medication List: 1)  Glipizide 10 Mg Tabs (Glipizide) .... Take one tablet by mouth two times a day for diabetes 2)  Benazepril Hcl 40 Mg Tabs (Benazepril hcl) .... Take 1 tablet by mouth once a day for blood pressure 3)  Mega Multi Men Tbcr (Multiple vitamins-minerals) .... Take 1 tablet by mouth once a day 4)  Cialis 5 Mg Tabs (Tadalafil) .... Take one pill as needed for planned sexual function within the next 12 hours/hold 5)  Metformin Hcl 1000 Mg Tabs (Metformin hcl) .... Take one tablet two times a day 6)  Cardizem Cd 120 Mg Xr24h-cap (Diltiazem hcl coated beads) .... Once daily 7)  Zolpidem Tartrate 10 Mg Tabs (Zolpidem tartrate) .... Take 1 tablet by mouth once a day at bedtime as needed for insomnia. 8)  Freestyle Lite Test Strp (Glucose blood) .... Use to test blood sugar up to 3x a day for the next week, then decrease to up to 1x/day (250.00) 9)  Lancets Ultra Thin 30g Misc (Lancets) .... 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, retail of Support: need to explore this with patien Special needs or Barriers: patient says he has ADHD, has trouble focusing and assimilating too much information  Potential Barriers  Economic/Supplies  Cognitive  Coping Skills  Diabetes Medications:  Comments: Patient inssists on self testing on  arm- did not like One Touch Ultra 2 meter    Monitoring Name of Meter  Freestyle meter on arm     Estimated /Usual Carb Intake Breakfast # of Carbs/Grams oatmeal with raisins & apples Midmorning # of Carbs/Grams uses a mineralrich dietary supplement once daily with 100 mg magnesium, zinc selenium, very high level of Vitmain B12, and a few other vitamins & minerals Lunch # of Carbs/Grams pastrami sandwich with oatmeal cooke Midafternoon # of Carbs/Grams was having "coke freezes", packs of oreas nad sugar babies before stoppping sugar recently Bedtime # of Carbs/Grams regular cheerwine and ice cream or cookies  Activity Limitations  Inadequate physical activity Diabetes Disease Process  Discussed today Define diabetes in simple terms: No knowledge   State own type of diabetes: Demonstrates competency Medications State name-action-dose-duration-side effects-and time to take medication: No knowledge   State appropriate timing of food related to medication: Needs review/assistance Nutritional Management Identify what foods most often affect blood glucose: Needs review/assistance    Verbalize importance of controlling food portions: No knowledge   State importance of spacing and not omitting meals and snacks: No knowledge   State changes planned for home meals/snacks: No knowledge    Monitoring State purpose and frequency of monitoring BG-ketones-HgbA1C  : No knowledge   Perform glucose monitoring/ketone testing and record results correctly: No knowledge    State target blood glucose and HgbA1C goals: No knowledge    Complications State the causes-signs and symptoms and prevention of Hyperglycemia: No knowledge   Explain  proper treatment of hyperglycemia: No knowledge   State the causes- signs and symptoms and prevention of hypoglycemia: No knowledge   Explain proper treatment of hypoglycemia: No knowledge    Exercise States importance of exercise: No knowledge   States  effect of exercise on blood glucose: No knowledge    Psychosocial Adjustment Identify two methods to cope with these feelings: Coping SkillsDiabetes Management Education Done: 12/16/2009    BEHAVIORAL GOALS INITIAL Monitoring blood glucose levels daily: bring meter to visits to be downloaded   Specific goal set today: no formal goals set except to bring meter to all visits      Patient educated about action of insulin in discussion of what is diabetes- he is very against using it- says he feels he can lower his blood sugar by limiting carbohydrates nad exercise- was not ready for label reading and we did not complete conversation about how to determine what foods and what portions of foods will increase blood sugar- i believe patient will want to use self monitoirng to learn this, Diabetes Self Management Support: clinic staff and CDE Follow-up:as needed per patient recommned 2-4 weeks with CBGs

## 2010-07-08 NOTE — Assessment & Plan Note (Signed)
Summary: EST-CPE/CH   Vital Signs:  Patient profile:   65 year old male Height:      67 inches (170.18 cm) Weight:      195.4 pounds (88.82 kg) BMI:     30.71 Temp:     98.5 degrees F oral Pulse rate:   120 / minute BP sitting:   149 / 92  (right arm)  Vitals Entered By: Chinita Pester RN (Oct 29, 2009 2:39 PM) CC: Check-up. Is Patient Diabetic? Yes Did you bring your meter with you today? No Pain Assessment Patient in pain? no      Nutritional Status BMI of > 30 = obese CBG Result 434  Have you ever been in a relationship where you felt threatened, hurt or afraid?No   Does patient need assistance? Functional Status Self care Ambulation Normal   Primary Care Provider:  Dr. 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   1) DM - patients A1c is > 10, patient refused to address this issue, after further discussion he said he will think of insulin usage on next visit.   2) Insomnia- Patient stated that he has taken ambien in the past which has worked for him, and would like a script  Patient also said he didnt take his cardizem or antigyperglycemic.   Patient currently denies SOB, Denies CP, Denies fever, chills, nausea, vomiting, diarrhea, constipation and otherwise doing well and denies any other  complaints.     Depression History:      The patient denies a depressed mood most of the day and a diminished interest in his usual daily activities.  The patient denies significant weight loss, significant weight gain, insomnia, hypersomnia, psychomotor agitation, psychomotor retardation, fatigue (loss of energy), feelings of worthlessness (guilt), impaired concentration (indecisiveness), and recurrent thoughts of death or suicide.         Preventive Screening-Counseling & Management  Alcohol-Tobacco     Alcohol drinks/day: rarely     Smoking Status: quit     Year Quit: 1993  Caffeine-Diet-Exercise     Does Patient Exercise: no  Current Medications (verified): 1)  Glipizide 10 Mg Tabs (Glipizide) .... Take One Tablet By Mouth Two Times A Day For Diabetes 2)  Benazepril Hcl 40 Mg Tabs (Benazepril Hcl) .... Take 1 Tablet By Mouth Once A Day For Blood Pressure 3)  Mega Multi Men  Tbcr (Multiple Vitamins-Minerals) .... Take 1 Tablet By Mouth Once A Day 4)  Cialis 5 Mg Tabs (Tadalafil) .... Take One Pill As Needed For Planned Sexual Function Within The Next 12 Hours/hold 5)  Metformin Hcl 1000 Mg Tabs (Metformin Hcl) .... Take One Tablet Two Times A Day 6)  Cardizem Cd 120 Mg Xr24h-Cap (Diltiazem Hcl Coated Beads) .... Once Daily 7)  Ambien Cr 6.25 Mg Cr-Tabs (Zolpidem Tartrate) .... Take 1 Tablet By Mouth Once A Day  Allergies (verified): No Known Drug Allergies  Social History: Does Patient Exercise:  no  Review of Systems       Per HPI   Physical  Exam  General:  The patient was alert and oriented in no acute distress. HEENT Normal.  Neck veins were flat, carotids were brisk.  Lungs were clear.  Heart sounds were regular but raqpid without murmurs or gallops. pmi NON displaced Abdomen was soft with active bowel sounds. There is no clubbing cyanosis or edema. Skin Warm and dry    Impression & Recommendations:  Problem # 1:  DM (ICD-250.00) Assessment Comment Only DM -  patients last A1c is > 10, today will recheck. after further discussion he said he will think of insulin usage on next visit.  will also check miroalbumin/cr ratio. on next visit may start lantus pen starting with 15-15 units at bedtime.   His updated medication list for this problem includes:    Glipizide 10 Mg Tabs (Glipizide) .[name]... Take one tablet by mouth two times a day for diabetes    Benazepril Hcl 40 Mg Tabs (Benazepril hcl) .[name]... Take 1 tablet by mouth once a day for blood pressure    Metformin Hcl 1000 Mg Tabs (Metformin hcl) .[name]... Take one tablet two times a day  Orders: T- Capillary Blood Glucose (82948) T-Hgb A1C (in-house) (16109UE)  Problem # 2:  HYPERTENSION (ICD-401.9) Assessment: Comment Only Patient also said he didnt take his cardizem there fore BP is elevated, will recheck on next visit. no changes made today.  His updated medication list for this problem includes:    Benazepril Hcl 40 Mg Tabs (Benazepril hcl) .[name]... Take 1 tablet by mouth once a day for blood pressure    Cardizem Cd 120 Mg Xr24h-cap (Diltiazem hcl coated beads) ..... Once daily  Orders: T-Basic Metabolic Panel [phone])  Problem # 3:  SINUS TACHYCARDIA (ICD-427.89) Assessment: Comment Only  Patient said he didnt take his cardizem therefore BP and HR are elevated, will recheck on next visit. no changes made today.  Labs Reviewed: Na: 142 (08/10/2008)   K+: 4.6 (08/10/2008)   CL: 103 (08/10/2008)   HCO3: 25 (08/10/2008) Ca: 9.7 (08/10/2008)   TSH: 1.35 (07/09/2009)   HCO3: 25 (08/10/2008)  Problem # 4:  HYPERLIPIDEMIA (ICD-272.4) Assessment: Comment Only will recheck FLP today.   Labs Reviewed: SGOT: 18 (03/28/2008)   SGPT: 25 (03/28/2008)   HDL:39.70 (07/09/2009), 35 (10/11/2006)  LDL:100 (10/11/2006)  Chol:190 (07/09/2009), 207 (10/11/2006)  Trig:355.0 (07/09/2009), 362 (10/11/2006)  Problem # 5:  INSOMNIA (ICD-780.52) Assessment: Comment Only Insomnia- Patient stated that he has  taken ambien in the past which has worked for him, and would like a script  His updated medication list for this problem includes:    Ambien Cr 6.25 Mg Cr-tabs (Zolpidem tartrate) .[name]... Take 1 tablet by mouth once a day  Complete Medication List: 1)  Glipizide 10 Mg Tabs (Glipizide) .... Take one tablet by mouth two times a day for diabetes 2)  Benazepril Hcl 40 Mg Tabs (Benazepril hcl) .... Take 1 tablet by mouth once a day for blood pressure 3)  Mega Multi Men Tbcr (Multiple vitamins-minerals) .... Take 1 tablet by mouth once a day 4)  Cialis 5 Mg Tabs (Tadalafil) .... Take one pill as needed for planned sexual function within the next 12 hours/hold 5)  Metformin Hcl 1000 Mg Tabs (Metformin hcl) .... Take one tablet two times a day 6)  Cardizem Cd 120 Mg Xr24h-cap (Diltiazem hcl coated beads) .... Once daily 7)  Ambien Cr 6.25 Mg Cr-tabs (Zolpidem tartrate) .... Take 1 tablet by mouth once a day  Other Orders: Gastroenterology Referral (GI) T-PSA Total ([id number])  T-Urine Microalbumin w/creat. ratio [phone]) T-Hepatic Function [phone])  Patient Instructions: 1)  Please schedule a follow-up appointment in 1 month. Prescriptions: AMBIEN CR 6.25 MG CR-TABS (ZOLPIDEM TARTRATE) Take 1 tablet by mouth once a day  #30 x 0   Entered and Authorized by:   Darnelle Maffucci MD   Signed by:   Darnelle Maffucci MD on 10/29/2009   Method used:   Print then Give to Patient   RxID:   [id number]   Prevention & Chronic Care Immunizations   Influenza vaccine: Not documented   Influenza vaccine deferral: Refused  (09/23/2009)    Tetanus booster: Not documented   Td booster deferral: Refused  (09/23/2009)    Pneumococcal vaccine: Not documented   Pneumococcal vaccine deferral: Refused  (09/23/2009)    H. zoster vaccine: Not documented   H. zoster vaccine deferral: Refused  (09/23/2009)  Colorectal Screening   Hemoccult: Negative  (02/28/2004)   Hemoccult action/deferral:  Ordered  (09/23/2009)    Colonoscopy: Not documented   Colonoscopy action/deferral: GI referral  (10/29/2009)  Other Screening   PSA: 1.21  (06/23/2007)   PSA ordered.   PSA action/deferral: Discussed-PSA requested  (10/29/2009)   Smoking status: quit  (10/29/2009)  Diabetes Mellitus   HgbA1C: 10.4  (10/29/2009)    Eye exam: Mild non-proliferative diabetic retinopathy.  OU Visual acuity OD:     20/30 Visual acuity OS:20/20      Intraocular pressure OD:  14    Intraocular pressure OS:     16 exam by Dr. Liliane Bade   (10/03/2008)   Eye exam due: 10/2009    Foot exam: Not documented   Foot exam action/deferral: Refused   High risk foot: Not documented   Foot care education: Not documented    Urine microalbumin/creatinine ratio: 37.2  (02/15/2007)   Urine microalbumin action/deferral: Ordered    Diabetes flowsheet reviewed?: Yes   Progress toward A1C goal: At goal  Lipids   Total Cholesterol: 190  (07/09/2009)   LDL: 100  (10/11/2006)   LDL Direct: 96.5  (07/09/2009)   HDL: 39.70  (07/09/2009)   Triglycerides: 355.0  (07/09/2009)    SGOT (AST): 18  (03/28/2008)   BMP action: Ordered   SGPT (ALT): 25  (03/28/2008)   Alkaline phosphatase: 72  (03/28/2008)   Total bilirubin: 1.5  (03/28/2008)    Lipid flowsheet reviewed?: Yes   Progress toward LDL goal: Unchanged  Hypertension   Last Blood Pressure: 149 / 92  (10/29/2009)   Serum creatinine: 0.74  (08/10/2008)   BMP action: Ordered   Serum potassium 4.6  (08/10/2008)    Hypertension flowsheet reviewed?: Yes   Progress toward BP goal: Unchanged  Self-Management Support :   Personal Goals (by the next clinic visit) :     Personal A1C goal: 7  (09/23/2009)     Personal blood pressure goal: 130/80  (09/23/2009)     Personal LDL goal: 100  (09/23/2009)    Patient will work on the following items until the next clinic visit to reach self-care goals:     Medications and monitoring: bring all of my  medications to every visit, examine my feet every day  (10/29/2009)     Eating: drink diet soda or water instead of juice or soda, eat more vegetables, use fresh or frozen vegetables, eat foods that are low in salt, eat baked foods instead of fried foods, eat fruit for snacks and desserts  (10/29/2009)     Activity: take a 30 minute walk  every day  (10/29/2009)    Diabetes self-management support: Written self-care plan  (10/29/2009)   Diabetes care plan printed    Hypertension self-management support: Written self-care plan  (10/29/2009)   Hypertension self-care plan printed.    Lipid self-management support: Written self-care plan  (10/29/2009)   Lipid self-care plan printed.   Nursing Instructions: GI referral for screening colonoscopy (see order)   Process Orders Check Orders Results:     [facility]: Order checked:     [id number] -- T-PSA Total -- ABN required due to diagnosis (CPT: [phone]) Tests Sent for requisitioning (Oct 30, 2009 10:20 AM):     10/29/2009: [facility] -- T-PSA Total [[id number]] (signed)     10/29/2009: [facility] -- T-Urine Microalbumin w/creat. ratio [[id number]] (signed)     10/29/2009: [facility] -- T-Hepatic Function [id number] (signed)     10/29/2009: [facility] -- T-Basic Metabolic Panel [phone] (signed)    Laboratory Results   Blood Tests   Date/Time Received: Oct 29, 2009 3:12 PM  Date/Time Reported: Burke Keels  Oct 29, 2009 3:12 PM   HGBA1C: 10.4%   (Normal Range: Non-Diabetic - 3-6%   Control Diabetic - 6-8%) CBG Random:: 434mg /dL

## 2010-07-08 NOTE — Progress Notes (Signed)
Summary: change ambien/ hla  Phone Note From Pharmacy   Caller: [id number]* Summary of Call: pt's insurance will not pay for ambien cr, they will pay for ambien 10mg , can this be changed? Initial call taken by: Marin Roberts RN,  Oct 31, 2009 3:30 PM  Follow-up for Phone Call        approved, please call it in, thanks.  Follow-up by: Darnelle Maffucci MD,  Oct 31, 2009 3:59 PM  Additional Follow-up for Phone Call Additional follow up Details #1::        Zolpidem Rx called to [facility]. Additional Follow-up by: Chinita Pester RN,  Oct 31, 2009 4:21 PM    New/Updated Medications: ZOLPIDEM TARTRATE 10 MG TABS (ZOLPIDEM TARTRATE) Take 1 tablet by mouth once a day at bedtime as needed for insomnia. Prescriptions: ZOLPIDEM TARTRATE 10 MG TABS (ZOLPIDEM TARTRATE) Take 1 tablet by mouth once a day at bedtime as needed for insomnia.  #30 x 1   Entered and Authorized by:   Darnelle Maffucci MD   Signed by:   Darnelle Maffucci MD on 10/31/2009   Method used:   Telephoned to ...       [facility] [address] (retail)       [address]       [place], [place]  [address]       Ph: [phone]       Fax: [phone]   RxID:   [id number]

## 2010-07-08 NOTE — Assessment & Plan Note (Signed)
Summary: EST-1 MONTH F/U VISIT/CH   Vital Signs:  Patient profile:   65 year old male Height:      67 inches (170.18 cm) Weight:      197.8 pounds (89.91 kg) BMI:     31.09 Temp:     98.2 degrees F (36.78 degrees C) oral Pulse rate:   102 / minute BP sitting:   117 / 72  (right arm)  Vitals Entered By: Stanton Kidney Ditzler RN (December 30, 2009 1:58 PM) Is Patient Diabetic? Yes Did you bring your meter with you today? No Pain Assessment Patient in pain? no      Nutritional Status BMI of > 30 = obese Nutritional Status Detail appetite good CBG Result 320  Have you ever been in a relationship where you felt threatened, hurt or afraid?denies   Does patient need assistance? Functional Status Self care Ambulation Normal Comments FU   Primary Care Provider:  Dr. 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   1) DM - patients A1c is > 10, he is willing to try the shot using the pen today. and finally agrees to have insulin pen prescribed  to him.   2) Insomnia- Ambien has worked well for the patient and he would like another script.   Patient also said he didnt take his cardizem or antigyperglycemic.   Patient currently denies SOB, Denies CP, Denies fever, chills, nausea, vomiting, diarrhea, constipation and otherwise doing well  and denies any other complaints.     Depression History:      The patient denies a depressed mood most of the day and a diminished interest in his usual daily activities.         Preventive Screening-Counseling & Management  Alcohol-Tobacco     Alcohol drinks/day: rarely     Smoking Status: quit     Year Quit: 1993  Caffeine-Diet-Exercise     Does Patient Exercise: no  Current Medications (verified): 1)  Glipizide 10 Mg Tabs (Glipizide) .... Take One Tablet By Mouth Two Times A Day For Diabetes 2)  Benazepril Hcl 40 Mg Tabs (Benazepril Hcl) .... Take 1 Tablet By Mouth Once A Day For Blood Pressure 3)  Mega Multi Men  Tbcr (Multiple Vitamins-Minerals) .... Take 1 Tablet By Mouth Once A Day 4)  Cialis 5 Mg Tabs (Tadalafil) .... Take One Pill As Needed For Planned Sexual Function Within The Next 12 Hours/hold 5)  Metformin Hcl 1000 Mg Tabs (Metformin Hcl) .... Take One Tablet Two Times A Day 6)  Cardizem Cd 120 Mg Xr24h-Cap (Diltiazem Hcl Coated Beads) .... Once Daily 7)  Zolpidem Tartrate 10 Mg Tabs (Zolpidem Tartrate) .... Take 1 Tablet By Mouth Once A Day At Bedtime As Needed For Insomnia. 8)  Freestyle Lite Test  Strp (Glucose Blood) .... Use To Test Blood Sugar Up To 3x A Day For The Next Week, Then Decrease To Up To 1x/day (250.00) 9)  Freestyle Lancets  Misc (Lancets) .... As Instructed 10)  Novolog Flexpen 100 Unit/ml Soln (Insulin Aspart) 11)  Lantus Solostar  100 Unit/ml Soln (Insulin Glargine) .... Inject 10 Units Subcutaneously At Bedtime 12)  Pen Needles 31g X 6 Mm Misc (Insulin Pen Needle) .... Use As Instructed  Allergies (verified): No Known Drug Allergies  Review of Systems       Per HPI  Physical Exam  General:  The patient was alert and oriented in no acute distress. HEENT Normal.  Neck veins were flat, carotids were brisk.  Lungs were clear.  Heart sounds were regular but raqpid without murmurs or gallops. pmi NON displaced Abdomen was soft with active bowel  sounds. There is no clubbing cyanosis or edema. Skin Warm and dry    Impression & Recommendations:  Problem # 1:  DIABETES MELLITUS, TYPE II (ICD-250.00)  patients A1c is > 10, patient refused to start insulin at this point despite his sugars being in the 300's, but he is willing to try how an insulin injection feels like, we gave 3 units of novolog,as it did not hurt, he finally agreed to start lantus therapy.  will bring back in one month for a1c check  On next visit if he agrees to starting lantus, d/c glipizide keep metformin, and start lantus at 20-25 units at bedtime only.  His updated medication list for this problem includes:    Glipizide 10 Mg Tabs (Glipizide) .Marland Kitchen... Take one tablet by mouth two times a day for diabetes    Benazepril Hcl 40 Mg Tabs (Benazepril hcl) .Marland Kitchen... Take 1 tablet by mouth once a day for blood pressure    Metformin Hcl 1000 Mg Tabs (Metformin hcl) .Marland Kitchen... Take one tablet two times a day    Novolog Flexpen 100 Unit/ml Soln (Insulin aspart)    Lantus Solostar 100 Unit/ml Soln (Insulin glargine) ..... Inject 10 units subcutaneously at bedtime  Orders: Capillary Blood Glucose/CBG (16109)  Problem # 2:  HYPERTENSION (ICD-401.9) Well controlled on current treatment, No new changes made today, Will continue to monitor.   His updated medication list for this problem includes:    Benazepril Hcl 40 Mg Tabs (Benazepril hcl) .Marland Kitchen... Take 1 tablet by mouth once a day for blood pressure    Cardizem Cd 120 Mg Xr24h-cap (Diltiazem hcl coated beads) ..... Once daily  Problem # 3:  HYPERLIPIDEMIA (ICD-272.4) Well controlled on current treatment, No new changes made today, Will continue to monitor.   Labs Reviewed: SGOT: 16 (10/29/2009)   SGPT: 17 (10/29/2009)   HDL:39.70 (07/09/2009), 35 (10/11/2006)  LDL:100 (10/11/2006)  Chol:190 (07/09/2009), 207 (10/11/2006)  Trig:355.0 (07/09/2009), 362 (10/11/2006)  Problem # 4:  INSOMNIA (ICD-780.52) stable, contine to monitor.    His updated medication list for this problem includes:    Zolpidem Tartrate 10 Mg Tabs (Zolpidem tartrate) .Marland Kitchen... Take 1 tablet by mouth once a day at bedtime as needed for insomnia.  Complete Medication List: 1)  Glipizide 10 Mg Tabs (Glipizide) .... Take one tablet by mouth two times a day for diabetes 2)  Benazepril Hcl 40 Mg Tabs (Benazepril hcl) .... Take 1 tablet by mouth once a day for blood pressure 3)  Mega Multi Men Tbcr (Multiple vitamins-minerals) .... Take 1 tablet by mouth once a day 4)  Cialis 5 Mg Tabs (Tadalafil) .... Take one pill as needed for planned sexual function within the next 12 hours/hold 5)  Metformin Hcl 1000 Mg Tabs (Metformin hcl) .... Take one tablet two times a day 6)  Cardizem Cd 120 Mg Xr24h-cap (Diltiazem hcl coated beads) .... Once daily 7)  Zolpidem Tartrate 10 Mg Tabs (Zolpidem  tartrate) .... Take 1 tablet by mouth once a day at bedtime as needed for insomnia. 8)  Freestyle Lite Test Strp (Glucose blood) .... Use to test blood sugar up to 3x a day for the next week, then decrease to up to 1x/day (250.00) 9)  Freestyle Lancets Misc (Lancets) .... As instructed 10)  Novolog Flexpen 100 Unit/ml Soln (Insulin aspart) 11)  Lantus Solostar 100 Unit/ml Soln (Insulin glargine) .... Inject 10 units subcutaneously at bedtime 12)  Pen Needles 31g X 6 Mm Misc (Insulin pen needle) .... Use as instructed  Other Orders: Admin of Therapeutic Inj  intramuscular or subcutaneous (16109) Insulin 5 units (U0454)  Patient Instructions: 1)  Please schedule a follow-up appointment in 2 weeks with me.  2)  Make a follow up with Michail Jewels.  Prescriptions: PEN NEEDLES 31G X 6 MM MISC (INSULIN PEN NEEDLE) use as instructed  #90 x 0   Entered and Authorized by:   Darnelle Maffucci MD   Signed by:   Darnelle Maffucci MD on 12/30/2009   Method used:   Print then Give to Patient   RxID:   [id number] LANTUS SOLOSTAR 100 UNIT/ML SOLN (INSULIN GLARGINE) inject 10 units  subcutaneously at bedtime  #1 month x 0   Entered and Authorized by:   Darnelle Maffucci MD   Signed by:   Darnelle Maffucci MD on 12/30/2009   Method used:   Print then Give to Patient   RxID:   [id number]    Prevention & Chronic Care Immunizations   Influenza vaccine: Not documented   Influenza vaccine deferral: Refused  (09/23/2009)    Tetanus booster: Not documented   Td booster deferral: Refused  (09/23/2009)    Pneumococcal vaccine: Not documented   Pneumococcal vaccine deferral: Refused  (09/23/2009)    H. zoster vaccine: Not documented   H. zoster vaccine deferral: Refused  (09/23/2009)  Colorectal Screening   Hemoccult: Negative  (02/28/2004)   Hemoccult action/deferral: Ordered  (09/23/2009)    Colonoscopy: Not documented   Colonoscopy action/deferral: GI referral  (10/29/2009)  Other Screening   PSA: 1.66  (10/29/2009)   PSA action/deferral: Discussed-PSA requested  (10/29/2009)   Smoking status: quit  (12/30/2009)  Diabetes Mellitus   HgbA1C: 10.4  (10/29/2009)    Eye exam: Mild non-proliferative diabetic retinopathy.  OU Visual acuity OD:     20/30 Visual acuity OS:20/20      Intraocular pressure OD:  14    Intraocular pressure OS:     16 exam by Dr. Liliane Bade   (10/03/2008)   Eye exam due: 10/2009    Foot exam: Not documented   Foot exam action/deferral: Refused   High risk foot: Not documented   Foot care education: Not documented    Urine microalbumin/creatinine ratio: 105.2  (10/29/2009)   Urine microalbumin action/deferral: Ordered    Diabetes flowsheet reviewed?: Yes   Progress toward A1C goal: Unchanged  Lipids   Total Cholesterol: 190  (07/09/2009)   LDL: 100  (10/11/2006)   LDL Direct: 96.5  (07/09/2009)   HDL: 39.70  (07/09/2009)   Triglycerides: 355.0  (07/09/2009)    SGOT (AST): 16  (10/29/2009)   BMP action: Ordered   SGPT (ALT): 17  (10/29/2009)   Alkaline phosphatase: 75  (10/29/2009)   Total bilirubin: 2.0   (10/29/2009)    Lipid flowsheet reviewed?: Yes   Progress toward LDL goal: At goal  Hypertension   Last Blood Pressure: 117 / 72  (12/30/2009)   Serum creatinine: 1.02  (  10/29/2009)   BMP action: Ordered   Serum potassium 4.5  (10/29/2009)    Hypertension flowsheet reviewed?: Yes   Progress toward BP goal: At goal  Self-Management Support :   Personal Goals (by the next clinic visit) :     Personal A1C goal: 7  (09/23/2009)     Personal blood pressure goal: 130/80  (09/23/2009)     Personal LDL goal: 100  (09/23/2009)    Patient will work on the following items until the next clinic visit to reach self-care goals:     Medications and monitoring: take my medicines every day, check my blood sugar, bring all of my medications to every visit  (12/30/2009)     Eating: drink diet soda or water instead of juice or soda, eat more vegetables, use fresh or frozen vegetables, eat fruit for snacks and desserts, limit or avoid alcohol  (12/30/2009)     Activity: take a 30 minute walk every day  (12/30/2009)    Diabetes self-management support: Written self-care plan, Education handout, Resources for patients handout  (12/30/2009)   Diabetes care plan printed   Diabetes education handout printed   Last diabetes self-management training by diabetes educator: 12/16/2009    Hypertension self-management support: Written self-care plan, Education handout, Resources for patients handout  (12/30/2009)   Hypertension self-care plan printed.   Hypertension education handout printed    Lipid self-management support: Written self-care plan, Education handout, Resources for patients handout  (12/30/2009)   Lipid self-care plan printed.   Lipid education handout printed      Resource handout printed.   Nursing Instructions: Please give 3 units on Novolog subcutaneously using insulin pen.     Medication Administration  Injection # 1:    Medication: Insulin 5 units    Diagnosis: DM  (ICD-250.00)    Route: IM    Comments: Novalog 3 units IM  2:30PM via pen by Jamison Neighbor and Dr Gilford Rile.    Patient tolerated injection without complications    Given by: Stanton Kidney Ditzler RN (December 30, 2009 2:33 PM)  Orders Added: 1)  Capillary Blood Glucose/CBG [82948] 2)  Est. Patient Level IV [16109] 3)  Admin of Therapeutic Inj  intramuscular or subcutaneous [96372] 4)  Insulin 5 units [J1815]

## 2010-07-08 NOTE — Progress Notes (Signed)
Summary: Meter prescription/dmr  Phone Note Refill Request Message from:  Patient on December 19, 2009 4:11 PM  Call from pt would like to get a glucose meter, Lancets , and strips.  Pt was unsure as to what meter he wants.  Pt was told that I would consult. Roosvelt Maser the Diabetes Educator to find out a meter that she recommends.   Method Requested: Electronic Initial call taken by: Angelina Ok RN,  December 19, 2009 4:12 PM  Follow-up for Phone Call        called patient to discuss meter request- left message Follow-up by: Jamison Neighbor RD,CDE,  December 19, 2009 5:18 PM  Additional Follow-up for Phone Call Additional follow up Details #1::        spoke wiht patient this morning- he wants a meter because he stooped eating all foods that have sugar or turn to sugar nad now he keeps having spells where he breaks out into a cold sweat and doesn;t feel good, he doesn;t understand this and wants a meter to help him know what is happening. He made an appointment with CDE for Monday. He would like a prescription sent to [facility] for a One Touch ultra 2 meter, strips and lancets. He is to test 3x a day until visist on monday at 1:30. Did discuss with him that [facility] may give him a hard time with billing Medicaid the 20%, and oif they do he could insist they bill  them or have prescription transfered to someon who does. Additional Follow-up by: Jamison Neighbor RD,CDE,  December 20, 2009 11:16 AM    Additional Follow-up for Phone Call Additional follow up Details #2::    Script signed. Follow-up by: Julaine Fusi  DO,  December 20, 2009 2:10 PM  New/Updated Medications: ONETOUCH ULTRA 2 W/DEVICE KIT (BLOOD GLUCOSE MONITORING SUPPL) use to test blood sugar daily- 3x a day for the next two days only (250.00) ONETOUCH ULTRA TEST  STRP (GLUCOSE BLOOD) Use to check blood sugar daily (250.00) ONETOUCH DELICA LANCETS  MISC (LANCETS) use to check blood sugar daily (3x only this weekend) (250.00) Prescriptions: ONETOUCH  DELICA LANCETS  MISC (LANCETS) use to check blood sugar daily (3x only this weekend) (250.00)  #100 x 11   Entered by:   Jamison Neighbor RD,CDE   Authorized by:   Julaine Fusi  DO   Signed by:   Julaine Fusi  DO on 12/20/2009   Method used:   Electronically to        [id number]* (retail)       [address]       [place], [place]  [address]       Ph: [phone]       Fax: [phone]   RxID:   [id number] ONETOUCH ULTRA TEST  STRP (GLUCOSE BLOOD) Use to check blood sugar daily (250.00)  #50 x 11   Entered by:   Jamison Neighbor RD,CDE   Authorized by:   Julaine Fusi  DO   Signed by:   Julaine Fusi  DO on 12/20/2009   Method used:   Electronically to        [id number]* (retail)       [address]       [place], [place]  [address]       Ph: [phone]       Fax: [phone]   RxID:   [id number] ONETOUCH ULTRA 2 W/DEVICE KIT (BLOOD GLUCOSE MONITORING SUPPL)  use to test blood sugar daily- 3x a day for the next two days only (250.00)  #1 x 0   Entered by:   Jamison Neighbor RD,CDE   Authorized by:   Julaine Fusi  DO   Signed by:   Julaine Fusi  DO on 12/20/2009   Method used:   Electronically to        [facility] [address] (retail)       [address]       [place], [place]  [address]       Ph: [phone]       Fax: [phone]   RxID:   [id number]

## 2010-07-08 NOTE — Progress Notes (Signed)
Summary: diabetes support/dmr  Phone Note Call from Patient Call back at Home Phone [phone]   Caller: Patient Summary of Call: "I ate oatmeal and fruit and got dizzy agan his morning- I think I need to learn the numbers" patient has not tested his blood sugar again, but is refering to learning more about carb content of foods. Encouraged his to test blood sugar, call with concerns. He said he'd call and make another appointment in the next week or two. Initial call taken by: Jamison Neighbor RD,CDE,  December 24, 2009 10:02 AM

## 2010-07-08 NOTE — Assessment & Plan Note (Signed)
Summary: DM TEACHING/DS  Is Patient Diabetic? Yes Did you bring your meter with you today? No Pain Assessment Patient in pain? no        Allergies: No Known Drug Allergies  was informed by nurse that patient did not want to stay to see RD, CDE.

## 2010-07-08 NOTE — Progress Notes (Signed)
Summary: Holter Results   Phone Note Outgoing Call   Call placed by: Bernita Raisin, RN, BSN,  September 18, 2009 4:30 PM Call placed to: Patient Details for Reason: Holter Results Summary of Call: RN Left Message To Call Back. Bernita Raisin, RN, BSN  September 18, 2009 4:30 PM  RN s/w Pt re: results. Bernita Raisin, RN, BSN  September 18, 2009 5:06 PM

## 2010-07-08 NOTE — Assessment & Plan Note (Addendum)
Summary: RA/DM FU VISIT/DS   Vital Signs:  Patient profile:   65 year old male Height:      67 inches (170.18 cm) Weight:      206.5 pounds (93.86 kg) BMI:     32.46 Temp:     97.6 degrees F (36.44 degrees C) oral Pulse rate:   112 / minute BP sitting:   160 / 97  (left arm) Cuff size:   regular  Vitals Entered By: Theotis Barrio NT II (April 28, 2010 2:11 PM) CC: DM  /CBG-A1C DONE / FOOT EXAM / REFUSE FLU SHOT / WANTS RESULTS OF X-RAY AND A COPY./ Is Patient Diabetic? Yes Did you bring your meter with you today? No Pain Assessment Patient in pain? no      Nutritional Status BMI of > 30 = obese CBG Result 338  Have you ever been in a relationship where you felt threatened, hurt or afraid?No   Does patient need assistance? Functional Status Self care Ambulation Normal   Primary Care Provider:  Blondell Reveal MD  CC:  DM  /CBG-A1C DONE / FOOT EXAM / REFUSE FLU SHOT / WANTS RESULTS OF X-RAY AND A COPY./.  History of Present Illness: 65 yo m with PMH comes to the office for a regular office visit.  He denies any problems currently at this office visit.   He reports that he is taking too many pills and is requesting if he can stop any of his medications.  DM : Diagnosed around 2006. Reports that he hasn't checked his blood sugars in the past one month. He reports compliancy to his medications except occasionally.      Depression History:      The patient denies a depressed mood most of the day and a diminished interest in his usual daily activities.         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 Take One Tablet By Mouth Two Times A Day For Diabetes 2)  Benazepril Hcl 40 Mg Tabs (Benazepril Hcl) .... Take 1 Tablet By Mouth Once A Day For Blood Pressure 3)  Mega Multi Men  Tbcr (Multiple Vitamins-Minerals) .... Take 1 Tablet By Mouth Once A Day 4)  Cialis 5 Mg Tabs (Tadalafil) .... Take One Pill As Needed For Planned Sexual Function Within The Next 12 Hours/hold 5)  Metformin Hcl 1000 Mg Tabs (Metformin Hcl) .... Take One Tablet Two Times A Day 6)  Cardizem Cd 120 Mg  Xr24h-Cap (Diltiazem Hcl Coated Beads) .... Once Daily 7)  Zolpidem Tartrate 10 Mg Tabs (Zolpidem Tartrate) .... Take 1 Tablet By Mouth Once A Day At Bedtime For Insomnia. 8)  Freestyle Lite Test  Strp (Glucose Blood) .... Use To Test Blood Sugar Up To 3x A Day For The Next Week, Then Decrease To Up To 1x/day (250.00) 9)  Freestyle Lancets  Misc (Lancets) .... As Instructed 10)  Pen Needles 31g X 6 Mm Misc (Insulin Pen Needle) .... Use As Instructed 11)  Saw Palmetto .... Two Times A Day  Current Medications (verified): 1)  Glipizide 10 Mg Tabs (Glipizide) .... Take One Tablet By Mouth Two Times A Day For Diabetes 2)  Benazepril Hcl 40 Mg Tabs (Benazepril Hcl) .... Take 1 Tablet By Mouth Once A Day For Blood Pressure 3)  Mega Multi Men  Tbcr (Multiple Vitamins-Minerals) .... Take 1 Tablet By Mouth Once A Day 4)   Cialis 5 Mg Tabs (Tadalafil) .... Take One Pill As Needed For Planned Sexual Function Within The Next 12 Hours/hold 5)  Metformin Hcl 1000 Mg Tabs (Metformin Hcl) .... Take One Tablet Two Times A Day 6)  Cardizem Cd 120 Mg Xr24h-Cap (Diltiazem Hcl Coated Beads) .... Once Daily 7)  Zolpidem Tartrate 10 Mg Tabs (Zolpidem Tartrate) .... Take 1 Tablet By Mouth Once A Day At Bedtime For Insomnia. 8)  Freestyle Lite Test  Strp (Glucose Blood) .... Use To Test Blood Sugar Up To 3x A Day For The Next Week, Then Decrease To Up To 1x/day (250.00) 9)  Freestyle Lancets  Misc (Lancets) .... As Instructed 10)  Pen Needles 31g X 6 Mm Misc (Insulin Pen Needle) .... Use As Instructed 11)  Saw Palmetto .... Two Times A Day  Allergies (verified): No Known Drug Allergies  Past History:  Family History: Last updated: 04/09/2010 mother - hx of breast ca father deceased from beck's sarcoid  Social History: Last updated: 04/09/2010 Former Smoker.  Quit in 1993.  2 - 2 1/2 x 30 yrs Has a single man's lifestyle and will remind anyone of it when asked. Prefers to see only male physicians. Widowed  retired Surveyor, minerals  Risk Factors: Alcohol Use: rarely (04/28/2010) Exercise: no (04/28/2010)  Risk Factors: Smoking Status: quit (04/28/2010)  Past Medical History: Reviewed history from 06/21/2009 and no changes required. 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 none  Family History: Reviewed history from 04/09/2010 and no changes required. mother - hx of breast ca father deceased from beck's sarcoid  Social History: Reviewed history from 04/09/2010 and no changes required. Former Smoker.  Quit in 1993.  2 - 2 1/2 x 30 yrs Has a single man's lifestyle and will remind anyone of it when asked. Prefers to see only male physicians. Widowed  retired Surveyor, minerals  Review of Systems      See HPI  Physical Exam  General:  alert, well-developed, well-nourished, and well-hydrated.   Head:  normocephalic and atraumatic.   Mouth:  good dentition.   Neck:  supple.   Lungs:  normal respiratory effort, no intercostal retractions, no accessory muscle use, and  normal breath sounds.   Heart:  normal rate, regular rhythm, no murmur, and no gallop.   Abdomen:  soft, non-tender, and normal bowel sounds.   Pulses:  R radial normal.   Extremities:  NO PEDAL EDEMA Neurologic:  alert & oriented X3, cranial nerves II-XII intact, strength normal in all extremities, and gait normal.    Diabetes Management Exam:    Foot Exam (with socks and/or shoes not present):       Sensory-Monofilament:          Left foot: abnormal          Right foot: normal   Impression & Recommendations:  Problem # 1:  ABNORMAL CHEST XRAY (ICD-793.1) Has seen pulmonologist and his CXR findings are chronic and of no clinical significance.   Problem # 2:  SINUS TACHYCARDIA (ICD-427.89) Currently appears sinus and rate controlled. Continue cardizem. CCB was started by [employer].   Problem # 3:  HYPERTENSION (ICD-401.9) Repeat Manual BP is 118/ 70. Continue curret regimen.  His updated medication list for this problem includes:    Benazepril Hcl 40 Mg Tabs (Benazepril hcl) .Marland Kitchen... Take 1 tablet by mouth once a day for blood pressure    Cardizem Cd 120 Mg Xr24h-cap (Diltiazem hcl coated beads)  ..... Once daily  BP today: 160/97 Prior BP: 180/92 (04/09/2010)  Labs Reviewed: K+: 4.5 (10/29/2009) Creat: : 1.02 (10/29/2009)   Chol: 190 (07/09/2009)   HDL: 39.70 (07/09/2009)   LDL: 100 (10/11/2006)   TG: 355.0 (07/09/2009)  Problem # 4:  DM (ICD-250.00) Uncontrolled. Patient declines any changes to his medications. Patient reports that he is eating lot of sugar, candies, and calorie drinks. He wants to control his dietary habis first and then change his medications if necessary.   His updated medication list for this problem includes:    Glipizide 10 Mg Tabs (Glipizide) .Marland Kitchen... Take one tablet by mouth two times a day for diabetes    Benazepril Hcl 40 Mg Tabs (Benazepril hcl) .Marland Kitchen... Take 1 tablet by mouth once a day for blood pressure    Metformin Hcl 1000 Mg Tabs (Metformin hcl) .Marland Kitchen... Take one tablet two times a day  Orders: T- Capillary Blood Glucose (47425) T-Hgb A1C (in-house) (95638VF)  Labs Reviewed: Creat: 1.02 (10/29/2009)     Last Eye Exam: Mild non-proliferative diabetic retinopathy.  OU Visual acuity OD:     20/30 Visual acuity OS:20/20      Intraocular pressure OD:  14    Intraocular pressure OS:     16 exam by Dr. Ernst Breach. Bulakowski  (10/03/2008) Reviewed HgBA1c results: 10.3 (04/28/2010)  10.4 (10/29/2009)  Problem # 5:  HYPERLIPIDEMIA (ICD-272.4) Patient wants to control with diet and exercise.   Complete Medication List: 1)  Glipizide 10 Mg Tabs (Glipizide) .... Take one tablet by mouth two times a day for diabetes 2)  Benazepril Hcl 40 Mg Tabs (Benazepril hcl) .... Take 1 tablet by mouth once a day for blood pressure 3)  Mega Multi Men Tbcr (Multiple vitamins-minerals) .... Take 1 tablet by mouth once a day 4)  Cialis 5 Mg Tabs (Tadalafil) .... Take one pill as needed for planned sexual function within the next 12 hours/hold 5)  Metformin Hcl 1000 Mg Tabs (Metformin hcl) .... Take one tablet two times a day 6)  Cardizem Cd 120 Mg Xr24h-cap (Diltiazem  hcl coated beads) .... Once daily 7)  Zolpidem Tartrate 10 Mg Tabs (Zolpidem tartrate) .... Take 1 tablet by mouth once a day at  bedtime for insomnia. 8)  Freestyle Lite Test Strp (Glucose blood) .... Use to test blood sugar up to 3x a day for the next week, then decrease to up to 1x/day (250.00) 9)  Freestyle Lancets Misc (Lancets) .... As instructed 10)  Pen Needles 31g X 6 Mm Misc (Insulin pen needle) .... Use as instructed 11)  Saw Palmetto  .... Two times a day  Patient Instructions: 1)  Please schedule a follow-up appointment in 1 month. 2)  It is important that you exercise regularly at least 20 minutes 5 times a week. If you develop chest pain, have severe difficulty breathing, or feel very tired , stop exercising immediately and seek medical attention. 3)  You need to lose weight. Consider a lower calorie diet and regular exercise.  4)  Check your blood sugars regularly. If your readings are usually above : 300 or below 70 you should contact our office.   Orders Added: 1)  T- Capillary Blood Glucose [82948] 2)  T-Hgb A1C (in-house) [83036QW] 3)  Est. Patient Level IV [16109]     Prevention & Chronic Care Immunizations   Influenza vaccine: Not documented   Influenza vaccine deferral: Refused  (09/23/2009)    Tetanus booster: Not documented   Td booster deferral: Refused  (09/23/2009)    Pneumococcal vaccine: Not documented   Pneumococcal vaccine deferral: Refused  (09/23/2009)    H. zoster vaccine: Not documented   H. zoster vaccine deferral: Refused  (09/23/2009)  Colorectal Screening   Hemoccult: Negative  (02/28/2004)   Hemoccult action/deferral: Ordered  (09/23/2009)    Colonoscopy: Not documented   Colonoscopy action/deferral: GI referral  (10/29/2009)  Other Screening   PSA: 1.66  (10/29/2009)   PSA action/deferral: Discussed-PSA requested  (10/29/2009)   Smoking status: quit  (04/28/2010)  Diabetes Mellitus   HgbA1C: 10.3  (04/28/2010)    Eye exam:  Mild non-proliferative diabetic retinopathy.  OU Visual acuity OD:     20/30 Visual acuity OS:20/20      Intraocular pressure OD:  14    Intraocular pressure OS:     16 exam by Dr. Liliane Bade   (10/03/2008)   Eye exam due: 10/2009    Foot exam: yes  (04/28/2010)   Foot exam action/deferral: Refused   High risk foot: Not documented   Foot care education: Not documented    Urine microalbumin/creatinine ratio: 105.2  (10/29/2009)   Urine microalbumin action/deferral: Ordered  Lipids   Total Cholesterol: 190  (07/09/2009)   LDL: 100  (10/11/2006)   LDL Direct: 96.5  (07/09/2009)   HDL: 39.70  (07/09/2009)   Triglycerides: 355.0  (07/09/2009)    SGOT (AST): 16  (10/29/2009)   BMP action: Ordered   SGPT (ALT): 17  (10/29/2009)   Alkaline phosphatase: 75  (10/29/2009)   Total bilirubin: 2.0  (10/29/2009)  Hypertension   Last Blood Pressure: 160 / 97  (04/28/2010)   Serum creatinine: 1.02  (10/29/2009)   BMP action: Ordered   Serum potassium 4.5  (10/29/2009)  Self-Management Support :   Personal Goals (by the next clinic visit) :     Personal A1C goal: 7  (09/23/2009)     Personal blood pressure goal: 130/80  (09/23/2009)     Personal LDL goal: 100  (09/23/2009)    Patient will work on the following items until the next clinic visit to reach self-care goals:     Medications and monitoring: take my medicines every day, check my blood sugar, examine my feet every  day  (04/28/2010)     Eating: drink diet soda or water instead of juice or soda, use fresh or frozen vegetables, eat fruit for snacks and desserts, limit or avoid alcohol  (04/28/2010)     Activity: take a 30 minute walk every day  (12/30/2009)    Diabetes self-management support: Written self-care plan, Education handout, Resources for patients handout  (12/30/2009)   Last diabetes self-management training by diabetes educator: 01/15/2010    Hypertension self-management support: Written self-care plan,  Education handout, Resources for patients handout  (12/30/2009)    Lipid self-management support: Written self-care plan, Education handout, Resources for patients handout  (12/30/2009)     Last LDL:                                                 100 (10/11/2006 12:56:00 PM)        Diabetic Foot Exam Foot Inspection Is there a history of a foot ulcer?              No Is there a foot ulcer now?              No Can the patient see the bottom of their feet?          Yes Are the shoes appropriate in style and fit?          Yes Is there swelling or an abnormal foot shape?          No Are the toenails long?                No Are the toenails thick?                No Are the toenails ingrown?              No Is there heavy callous build-up?              No Is there a claw toe deformity?                          No Is there elevated skin temperature?            No Is there limited ankle dorsiflexion?            No Is there foot or ankle muscle weakness?            No Do you have pain in calf while walking?           No         10-g (5.07) Semmes-Weinstein Monofilament Test Performed by: Theotis Barrio NT II          Right Foot          Left Foot Visual Inspection     normal           abnormal Test Control      abnormal         abnormal Site 1         normal         abnormal Site 2         normal         abnormal Site 3         normal         abnormal Site 4  normal         abnormal Site 5         normal         abnormal Site 6         normal         abnormal Site 7         normal         abnormal Site 8         normal         abnormal Site 9         normal         abnormal Site 10         normal         abnormal  Impression      normal         abnormal   Laboratory Results   Blood Tests   Date/Time Received: April 28, 2010 2:46 PM  Date/Time Reported: Alric Quan  April 28, 2010 2:46 PM   HGBA1C: 10.3%   (Normal Range: Non-Diabetic - 3-6%    Control Diabetic - 6-8%) CBG Random:: 338mg /dL     Appended Document: RA/DM FU VISIT/DS    Clinical Lists Changes  Orders: Added new Test order of T-Hemoccult Card-Multiple (take home) (94854) - Signed  CARDS MAILED TO THE PATIENT. Lela Sturdivant NT II  June 17, 2010 8:42 AM     Laboratory Results

## 2010-07-08 NOTE — Assessment & Plan Note (Signed)
Summary: est-ck/fu/meds/cfb   Vital Signs:  Patient profile:   65 year old male Height:      67 inches (170.18 cm) Weight:      198.0 pounds (90.00 kg) BMI:     31.12 Temp:     98.6 degrees F (37.00 degrees C) oral Pulse rate:   115 / minute BP sitting:   144 / 85  (right arm)  Vitals Entered By: Cynda Familia Duncan Dull) (September 23, 2009 1:59 PM) CC: pt c/o pain in right leg, the veins in is legs are coming to the surface, low back pain, losing muscle mass in legs, Depression Is Patient Diabetic? Yes Did you bring your meter with you today? No Pain Assessment Patient in pain? yes     Location: lower back Intensity: 10 Onset of pain   off and on  Nutritional Status BMI of > 30 = obese  Have you ever been in a relationship where you felt threatened, hurt or afraid?No   Does patient need assistance? Functional Status Self care Ambulation Normal   Primary Care Provider:  Dr. Joaquin Courts   CC:  pt c/o pain in right leg, the veins in is legs are coming to the surface, low back pain, losing muscle mass in legs, and Depression.  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 Has multiple complaints including:  1) ED - he is seeing urologist and is saying that cialis is not working, was talking about getting a pump however i explained to him  that his is 2/2 to his DM, however he does not like this idea.   2) Varicose veins - worsening over the past few months, when discussed with the patient the possibility of surgery, the patient said that he will think about it.   3) DM - patients A1c is > 10, patient refused to address this issue, refuses insulin injection and refuses addition of further dm2 agents such as actos. after further discussion he said he will think of insulin usage and he will go to our diabetic educator for diet advice and further DM information.   Patient currently denies SOB, Denies CP, Denies fever, chills, nausea, vomiting, diarrhea, constipation and otherwise doing well and denies any other complaints.     Depression History:      The patient is having a depressed mood most of the day and has a diminished interest in his usual daily activities.  The patient denies significant weight loss, significant weight gain, insomnia, hypersomnia, psychomotor agitation, psychomotor retardation, fatigue (loss of energy), feelings of worthlessness (guilt), impaired concentration (indecisiveness), and recurrent thoughts of death or suicide.        The patient denies that he has thought about ending his life.         Preventive Screening-Counseling & Management  Alcohol-Tobacco     Smoking Status: quit     Year Quit: 1993  Current Medications (verified): 1)  Glipizide 10 Mg Tabs (Glipizide) .... Take One Tablet By Mouth Two Times A Day For Diabetes 2)  Benazepril Hcl 40 Mg Tabs (Benazepril Hcl) .Marland KitchenMarland KitchenMarland Kitchen  Take 1 Tablet By Mouth Once A Day For Blood Pressure 3)  Mega Multi Men  Tbcr (Multiple Vitamins-Minerals) .... Take 1 Tablet By Mouth Once A Day 4)  Cialis 5 Mg Tabs (Tadalafil) .... Take One Pill As Needed For Planned Sexual Function Within The Next 12 Hours/hold 5)  Metformin Hcl 1000 Mg Tabs (Metformin Hcl) .... Take One Tablet Two Times A Day 6)  Cardizem Cd 120 Mg Xr24h-Cap (Diltiazem Hcl Coated Beads) .... Once  Daily  Allergies (verified): No Known Drug Allergies  Review of Systems       Per HPI  Physical Exam  General:  The patient was alert and oriented in no acute distress. HEENT Normal.  Neck veins were flat, carotids were brisk.  Lungs were clear.  Heart sounds were regular but raqpid without murmurs or gallops. pmi NON displaced Abdomen was soft with active bowel sounds. There is no clubbing cyanosis or edema. Skin Warm and dry  Mouth:  good dentition and pharynx pink and moist.  Minimal errythema.  No exudates or lesions.   Neck:  supple, no masses, no thyromegaly, and no cervical lymphadenopathy.   Lungs:  normal respiratory effort, normal breath sounds, no dullness, no crackles, and no wheezes.   Heart:  normal rate, regular rhythm, no murmur, no gallop, and no rub.   Abdomen:  soft, non-tender, normal bowel sounds, no distention, and no masses.   Msk:  normal ROM, no joint tenderness, no joint swelling, and no joint warmth.   Extremities:  No clubbing, cyanosis, edema, or deformity noted with normal full range of motion of all joints.   Neurologic:  alert & oriented X3 and cranial nerves II-XII intact.  Otherwise grossly intact Skin:  color normal.  He has a large skin tag on the lateral aspect of the left side of his neck, with some reddening of the tip of it.   Psych:  Easily distracted, and tangential.   Impression & Recommendations:  Problem # 1:  DIABETES MELLITUS, TYPE II (ICD-250.00) DM - patients A1c is > 10, patient refused to address this issue, refuses insulin injection and refuses addition of further dm2 agents such as actos. after further discussion he said he will think of insulin usage and he will go to our diabetic educator for diet advice and further DM information.   On next visit if he agrees to starting lantus, d/c glipizide keep metformin, and start lantus at 20-25 units at bedtime only.    His updated medication list for this problem includes:     Glipizide 10 Mg Tabs (Glipizide) .Marland Kitchen... Take one tablet by mouth two times a day for diabetes    Benazepril Hcl 40 Mg Tabs (Benazepril hcl) .Marland Kitchen... Take 1 tablet by mouth once a day for blood pressure    Metformin Hcl 1000 Mg Tabs (Metformin hcl) .Marland Kitchen... Take one tablet two times a day  Orders: T-Urine Microalbumin w/creat. ratio [phone]) Diabetic Clinic Referral (Diabetic)  Problem # 2:  HYPERTENSION (ICD-401.9)  BP slightly elevated, however patient refuses further adjustment to his medication regiement.  Patient understands the risks and benefits of not changing BP meds at this time.   His updated medication list for this problem includes:    Benazepril Hcl 40 Mg Tabs (Benazepril hcl) .Marland Kitchen... Take 1 tablet by mouth once a day for blood pressure    Cardizem Cd 120 Mg Xr24h-cap (Diltiazem hcl coated beads) ..... Once daily  BP today: 144/85 Prior BP: 126/80 (06/26/2009)  Labs Reviewed: K+:  4.6 (08/10/2008) Creat: : 0.74 (08/10/2008)   Chol: 190 (07/09/2009)   HDL: 39.70 (07/09/2009)   LDL: 100 (10/11/2006)   TG: 355.0 (07/09/2009)  Problem # 3:  HYPERLIPIDEMIA (ICD-272.4)  Multiple patient handouts were given to Mr. Whitmoyer today regarding hyperlipidemia and therapy. currently he is not open to the idea of starting any new medication and refuses new labs. his ldl goal is <70.    Labs Reviewed: Chol: 207 (10/11/2006)   HDL: 35 (10/11/2006)   LDL: 100 (10/11/2006)   TG: 362 (10/11/2006) SGOT: 27 (10/11/2006)   SGPT: 30 (10/11/2006)  Labs Reviewed: SGOT: 18 (03/28/2008)   SGPT: 25 (03/28/2008)   HDL:39.70 (07/09/2009), 35 (10/11/2006)  LDL:100 (10/11/2006)  Chol:190 (07/09/2009), 207 (10/11/2006)  Trig:355.0 (07/09/2009), 362 (10/11/2006)  Problem # 4:  DECREASED LIBIDO (ICD-799.81) he is seeing urologist and is saying that cialis is not working, was talking about getting a pump however i explained to him that his is 2/2 to his DM, however he does not like this idea.   Complete  Medication List: 1)  Glipizide 10 Mg Tabs (Glipizide) .... Take one tablet by mouth two times a day for diabetes 2)  Benazepril Hcl 40 Mg Tabs (Benazepril hcl) .... Take 1 tablet by mouth once a day for blood pressure 3)  Mega Multi Men Tbcr (Multiple vitamins-minerals) .... Take 1 tablet by mouth once a day 4)  Cialis 5 Mg Tabs (Tadalafil) .... Take one pill as needed for planned sexual function within the next 12 hours/hold 5)  Metformin Hcl 1000 Mg Tabs (Metformin hcl) .... Take one tablet two times a day 6)  Cardizem Cd 120 Mg Xr24h-cap (Diltiazem hcl coated beads) .... Once daily  Other Orders: T-Hgb A1C (in-house) [phone]) T- Capillary Blood Glucose ([id number]) T-PSA Total ([id number]) T-Comprehensive Metabolic Panel ([id number])  Patient Instructions: 1)  Please schedule a follow-up appointment in 1 month. Prescriptions: BENAZEPRIL HCL 40 MG TABS (BENAZEPRIL HCL) Take 1 tablet by mouth once a day for blood pressure  #30 x 11   Entered and Authorized by:   Darnelle Maffucci MD   Signed by:   Darnelle Maffucci MD on 09/23/2009   Method used:   Electronically to        [facility] # [id number]* (retail)       [address]       [place], [place]  [address]       Ph: [phone]       Fax: [phone]   RxID:   [id number] GLIPIZIDE 10 MG TABS (GLIPIZIDE) Take one tablet by mouth two times a day for diabetes  #60 x 11   Entered and Authorized by:   Darnelle Maffucci MD   Signed by:   Darnelle Maffucci MD on 09/23/2009   Method used:   Electronically to        [facility] # [id number]* (retail)       [address]       [place], [place]  [address]       Ph: [phone]       Fax: [phone]   RxID:   [id number] METFORMIN HCL 1000 MG TABS (METFORMIN HCL) take one tablet two times a day  #60 x 11   Entered and Authorized by:   Darnelle Maffucci MD   Signed by:   Darnelle Maffucci MD on 09/23/2009   Method used:   Electronically to        [id number]* (retail)  [address] [place], [place]  [address]       Ph: [phone]       Fax: [phone]   RxID:   [id number]   Prevention & Chronic Care Immunizations   Influenza vaccine: Not documented   Influenza vaccine deferral: Refused  (09/23/2009)    Tetanus booster: Not documented   Td booster deferral: Refused  (09/23/2009)    Pneumococcal vaccine: Not documented   Pneumococcal vaccine deferral: Refused  (09/23/2009)    H. zoster vaccine: Not documented   H. zoster vaccine deferral: Refused  (09/23/2009)  Colorectal Screening   Hemoccult: Negative  (02/28/2004)   Hemoccult action/deferral: Ordered  (09/23/2009)    Colonoscopy: Not documented   Colonoscopy action/deferral: Refused  (09/23/2009)  Other Screening   PSA: 1.21  (06/23/2007)   PSA ordered.   PSA action/deferral: Discussed-PSA requested  (09/23/2009)   Smoking status: quit  (09/23/2009)  Diabetes Mellitus   HgbA1C: 10.2  (07/09/2009)    Eye exam: Mild non-proliferative diabetic retinopathy.  OU Visual acuity OD:     20/30 Visual acuity OS:20/20      Intraocular pressure OD:  14    Intraocular pressure OS:     16 exam by Dr. Liliane Bade   (10/03/2008)   Eye exam due: 10/2009    Foot exam: Not documented   Foot exam action/deferral: Refused   High risk foot: Not documented   Foot care education: Not documented    Urine microalbumin/creatinine ratio: 37.2  (02/15/2007)   Urine microalbumin action/deferral: Ordered    Diabetes flowsheet reviewed?: Yes   Progress toward A1C goal: Unchanged  Lipids   Total Cholesterol: 190  (07/09/2009)   LDL: 100  (10/11/2006)   LDL Direct: 96.5  (07/09/2009)   HDL: 39.70  (07/09/2009)   Triglycerides: 355.0  (07/09/2009)    SGOT (AST): 18  (03/28/2008)   BMP action: Ordered   SGPT (ALT): 25  (03/28/2008) CMP ordered    Alkaline phosphatase: 72  (03/28/2008)   Total bilirubin: 1.5  (03/28/2008)    Lipid  flowsheet reviewed?: Yes   Progress toward LDL goal: At goal  Hypertension   Last Blood Pressure: 144 / 85  (09/23/2009)   Serum creatinine: 0.74  (08/10/2008)   BMP action: Ordered   Serum potassium 4.6  (08/10/2008) CMP ordered     Hypertension flowsheet reviewed?: Yes   Progress toward BP goal: Unchanged  Self-Management Support :   Personal Goals (by the next clinic visit) :     Personal A1C goal: 7  (09/23/2009)     Personal blood pressure goal: 130/80  (09/23/2009)     Personal LDL goal: 100  (09/23/2009)    Patient will work on the following items until the next clinic visit to reach self-care goals:     Medications and monitoring: take my medicines every day  (09/23/2009)     Eating: eat foods that are low in salt, eat baked foods instead of fried foods  (09/23/2009)    Diabetes self-management support: Pre-printed educational material, Resources for patients handout, Written self-care plan, Referred for DM self-management training  (09/23/2009)   Diabetes care plan printed   Referred for diabetes self-mgmt training.    Hypertension self-management support: Pre-printed educational material, Resources for patients handout, Written self-care plan  (09/23/2009)   Hypertension self-care plan printed.    Lipid self-management support: Pre-printed educational material, Resources for patients handout, Written self-care plan  (09/23/2009)   Lipid self-care plan  printed.      Resource handout printed.   Nursing Instructions: Refer for diabetes self-management training (see order)   Process Orders Check Orders Results:     [facility]: Order checked:     [id number] -- T-PSA Total -- ABN required due to diagnosis (CPT: [phone]) Tests Sent for requisitioning (September 23, 2009 4:23 PM):     09/23/2009: [facility] -- T-Urine Microalbumin w/creat. 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

## 2010-07-08 NOTE — Progress Notes (Signed)
Summary: Chest Xray.  Phone Note Call from Patient   Caller: Patient Call For: Margarito Liner MD Reason for Call: Insurance Question Summary of Call: Wanted to let Dr. Rogelia Boga know that he would like to if he can go ahead with Chest Xray.   Pt also wanted to know if the origin of the article was legitimate.  Said that he would check on this later.. Initial call taken by: Angelina Ok RN,  February 11, 2010 4:38 PM  Follow-up for Phone Call        I will go ahead and order CXR to put patient's mind at ease - I know this is not a great reason but this is the "art" of medicine. Follow-up by: Blanch Media MD,  February 11, 2010 4:45 PM  Additional Follow-up for Phone Call Additional follow up Details #1::        Unable to reach pt.  Message left for him to call clinic and come in for chest xray. Will wait to hear from pt Additional Follow-up by: Merrie Roof RN,  March 07, 2010 10:13 AM  New Problems: Hx of TOBACCO ABUSE, HX OF (ICD-V15.82)   New Problems: Hx of TOBACCO ABUSE, HX OF (ICD-V15.82)

## 2010-07-08 NOTE — Progress Notes (Signed)
Summary: diabetes support/dmr  Phone Note Outgoing Call   Call placed by: Jamison Neighbor RD,CDE,  February 04, 2010 2:25 PM Summary of Call: left message that Hgb A1C is due. Encouraged him to have it done soon to monitor the effects of his self monitoring and dietary changes.

## 2010-07-08 NOTE — Progress Notes (Signed)
Summary: Refill/gh  Phone Note Refill Request Message from:  Fax from Pharmacy on December 03, 2009 9:22 AM  Refills Requested: Medication #1:  ZOLPIDEM TARTRATE 10 MG TABS Take 1 tablet by mouth once a day at bedtime as needed for insomnia..   Last Refilled: 11/27/2009 Pt would like to get 5 refills if possible.   Method Requested: Electronic Initial call taken by: Angelina Ok RN,  December 03, 2009 9:23 AM  Follow-up for Phone Call        Refill approved-nurse to complete Follow-up by: Darnelle Maffucci MD,  December 04, 2009 5:48 AM  Additional Follow-up for Phone Call Additional follow up Details #1::        Rx faxed to pharmacy Additional Follow-up by: Angelina Ok RN,  December 04, 2009 11:05 AM    Prescriptions: ZOLPIDEM TARTRATE 10 MG TABS (ZOLPIDEM TARTRATE) Take 1 tablet by mouth once a day at bedtime as needed for insomnia.  #30 x 1   Entered and Authorized by:   Darnelle Maffucci MD   Signed by:   Darnelle Maffucci MD on 12/04/2009   Method used:   Telephoned to ...       [facility] [address] (retail)       [address]       [place], [place]  [address]       Ph: [phone]       Fax: [phone]   RxID:   [id number]

## 2010-07-08 NOTE — Assessment & Plan Note (Signed)
Summary: EST-1 MONTH F/U VISIT/CH   Vital Signs:  Patient profile:   65 year old male Height:      67 inches (170.18 cm) Weight:      195.7 pounds (88.95 kg) BMI:     30.76 Temp:     97.2 degrees F (36.22 degrees C) oral Pulse rate:   102 / minute BP sitting:   161 / 90  (right arm)  Vitals Entered By: Chinita Pester RN (November 27, 2009 9:31 AM) CC: F/u month.. Is Patient Diabetic? Yes Did you bring your meter with you today? No Pain Assessment Patient in pain? no      Nutritional Status BMI of > 30 = obese CBG Result 455  Have you ever been in a relationship where you felt threatened, hurt or afraid?No   Does patient need assistance? Functional Status Self care Ambulation Normal   Primary Care Provider:  Dr. Joaquin Courts   CC:  F/u month...  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   1) DM - patients A1c is > 10, patient refused to address this issue, after further discussion he said he will think of insulin usage on next visit, again.....   2) Insomnia- Ambien has worked well for the patient and he would like another script.   Patient also said he didnt take his cardizem or antigyperglycemic.   Patient currently denies SOB, Denies CP, Denies fever, chills, nausea, vomiting, diarrhea, constipation and otherwise doing well and  denies any other complaints.     Depression History:      The patient denies a depressed mood most of the day and a diminished interest in his usual daily activities.  The patient denies significant weight loss, significant weight gain, insomnia, hypersomnia, psychomotor agitation, psychomotor retardation, fatigue (loss of energy), feelings of worthlessness (guilt), impaired concentration (indecisiveness), and recurrent thoughts of death or suicide.         Preventive Screening-Counseling & Management  Alcohol-Tobacco     Alcohol drinks/day: rarely     Smoking Status: quit     Year Quit: 1993  Caffeine-Diet-Exercise     Does Patient Exercise: no  Current Medications (verified): 1)  Glipizide 10 Mg Tabs (Glipizide) .... Take One Tablet By Mouth Two Times A Day For Diabetes 2)  Benazepril Hcl 40 Mg Tabs (Benazepril Hcl) .... Take 1 Tablet By Mouth Once A Day For Blood Pressure 3)  Mega Multi Men  Tbcr (Multiple Vitamins-Minerals) .... Take 1 Tablet By Mouth Once A Day 4)  Cialis 5 Mg Tabs (Tadalafil) .... Take One Pill As Needed For Planned Sexual Function Within The Next 12 Hours/hold 5)  Metformin Hcl 1000 Mg Tabs (Metformin Hcl) .... Take One Tablet Two Times A Day 6)  Cardizem Cd 120 Mg Xr24h-Cap (Diltiazem Hcl Coated Beads) .... Once Daily 7)  Zolpidem Tartrate 10 Mg Tabs (Zolpidem Tartrate) .... Take 1 Tablet By Mouth Once A Day At Bedtime As Needed For Insomnia.  Allergies (verified): No Known Drug Allergies  Review of Systems       Per HPI  Physical  Exam  General:  The patient was alert and oriented in no acute distress. HEENT Normal.  Neck veins were flat, carotids were brisk.  Lungs were clear.  Heart sounds were regular but raqpid without murmurs or gallops. pmi NON displaced Abdomen was soft with active bowel sounds. There is no clubbing cyanosis or edema. Skin Warm and dry    Impression & Recommendations:  Problem # 1:  DM (ICD-250.00) Assessment Comment  Only DM - patients last A1c is > 10. after further discussion he said he will think of insulin usage on next visit.  -miroalbumin/cr ratio >100. on next visit may start lantus pen starting with 15-20 units at bedtime.   His updated medication list for this problem includes:    Glipizide 10 Mg Tabs (Glipizide) .Marland Kitchen... Take one tablet by mouth two times a day for diabetes    Benazepril Hcl 40 Mg Tabs (Benazepril hcl) .Marland Kitchen... Take 1 tablet by mouth once a day for blood pressure    Metformin Hcl 1000 Mg Tabs (Metformin hcl) .Marland Kitchen... Take one tablet two times a day  Orders: T- Capillary Blood Glucose (82948) T-Hgb A1C (in-house) ([address]109UE)  Problem # 2:  INSOMNIA (ICD-780.52) Assessment: Comment Only Well controlled on current treatment, No new changes made today, Will continue to monitor.   His updated medication list for this problem includes:    Zolpidem Tartrate 10 Mg Tabs (Zolpidem tartrate) .Marland Kitchen... Take 1 tablet by mouth once a day at bedtime as needed for insomnia.  Problem # 3:  SINUS TACHYCARDIA (ICD-427.89) Assessment: Comment Only  Patient said he didnt take his cardizem therefore BP and HR are elevated, will recheck on next visit. no changes made today.  Labs Reviewed: Na: 142 (08/10/2008)   K+: 4.6 (08/10/2008)   CL: 103 (08/10/2008)   HCO3: 25 (08/10/2008) Ca: 9.7 (08/10/2008)   TSH: 1.35 (07/09/2009)   HCO3: 25 (08/10/2008)  Problem # 4:  HYPERTENSION (ICD-401.9) Assessment: Comment Only Well controlled on current treatment, No new changes made today, Will continue to monitor.   His updated medication list for this problem includes:    Benazepril Hcl 40 Mg Tabs (Benazepril hcl) .Marland Kitchen... Take 1 tablet by mouth once a day for blood pressure    Cardizem Cd 120 Mg Xr24h-cap (Diltiazem hcl coated beads) ..... Once daily  Problem # 5:  HYPERLIPIDEMIA (ICD-272.4) Assessment: Comment Only cholesterol high, pt will consider starting statins.  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   Complete Medication List: 1)  Glipizide 10 Mg Tabs (Glipizide) .... Take one tablet by mouth two times a day for diabetes 2)  Benazepril Hcl 40 Mg Tabs (Benazepril hcl) .... Take 1 tablet by mouth once a day for blood pressure 3)  Mega Multi Men Tbcr (Multiple vitamins-minerals) .... Take 1 tablet by mouth once a day 4)  Cialis 5 Mg Tabs (Tadalafil) .... Take one pill as needed for planned sexual function within the next 12 hours/hold 5)  Metformin Hcl 1000 Mg Tabs (Metformin hcl) .... Take one tablet two times a day 6)  Cardizem Cd 120 Mg Xr24h-cap (Diltiazem hcl coated beads) .... Once daily 7)  Zolpidem Tartrate 10 Mg Tabs (Zolpidem tartrate) .... Take 1 tablet by mouth once a day at bedtime as needed for insomnia.  Other Orders: Capillary  Blood Glucose/CBG [phone])  Patient Instructions: 1)  Please schedule a follow-up appointment in 2 weeks to 1 month with Dr. Gilford Rile    Prevention & Chronic Care Immunizations   Influenza vaccine: Not documented   Influenza vaccine deferral: Refused  (09/23/2009)    Tetanus booster: Not documented   Td booster deferral: Refused  (09/23/2009)    Pneumococcal vaccine: Not documented   Pneumococcal vaccine deferral: Refused  (09/23/2009)    H. zoster vaccine: Not documented   H. zoster vaccine deferral: Refused  (09/23/2009)  Colorectal Screening   Hemoccult: Negative  (02/28/2004)   Hemoccult action/deferral: Ordered  (09/23/2009)    Colonoscopy: Not documented   Colonoscopy action/deferral: GI referral  (10/29/2009)  Other Screening   PSA: 1.66  (10/29/2009)   PSA action/deferral: Discussed-PSA requested  (10/29/2009)   Smoking status: quit  (11/27/2009)  Diabetes Mellitus   HgbA1C:  10.4  (10/29/2009)    Eye exam: Mild non-proliferative diabetic retinopathy.  OU Visual acuity OD:     20/30 Visual acuity OS:20/20      Intraocular pressure OD:  14    Intraocular pressure OS:     16 exam by Dr. Liliane Bade   (10/03/2008)   Eye exam due: 10/2009    Foot exam: Not documented   Foot exam action/deferral: Refused   High risk foot: Not documented   Foot care education: Not documented    Urine microalbumin/creatinine ratio: 105.2  (10/29/2009)   Urine microalbumin action/deferral: Ordered  Lipids   Total Cholesterol: 190  (07/09/2009)   LDL: 100  (10/11/2006)   LDL Direct: 96.5  (07/09/2009)   HDL: 39.70  (07/09/2009)   Triglycerides: 355.0  (07/09/2009)    SGOT (AST): 16  (10/29/2009)   BMP action: Ordered   SGPT (ALT): 17  (10/29/2009)   Alkaline phosphatase: 75  (10/29/2009)   Total bilirubin: 2.0  (10/29/2009)  Hypertension   Last Blood Pressure: 161 / 90  (11/27/2009)   Serum creatinine: 1.02  (10/29/2009)   BMP action: Ordered   Serum potassium 4.5  (10/29/2009)  Self-Management Support :   Personal Goals (by the next clinic visit) :     Personal A1C goal: 7  (09/23/2009)     Personal blood pressure goal: 130/80  (09/23/2009)     Personal LDL goal: 100  (09/23/2009)    Patient will work on the following items until the next clinic visit to reach self-care goals:     Medications and monitoring: take my medicines every day, bring all of my medications to every visit  (11/27/2009)     Eating: eat more vegetables, use fresh or frozen vegetables, eat foods that are low in salt, eat baked foods instead of fried foods  (11/27/2009)     Activity: take a 30 minute walk every day  (11/27/2009)    Diabetes self-management support: Written self-care plan, Referred for medical nutrition therapy  (11/27/2009)   Diabetes care plan printed    Hypertension self-management support: Written self-care plan, Referred for medical nutrition therapy   (11/27/2009)   Hypertension self-care plan printed.    Lipid self-management support: Written self-care plan, Referred for medical nutrition therapy  (11/27/2009)   Lipid self-care plan printed.   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

## 2010-07-08 NOTE — Miscellaneous (Signed)
Summary: PATIENT REQUEST  PATIENT REQUEST   Imported By: Shon Hough 01/07/2010 15:18:14  _____________________________________________________________________  External Attachment:    Type:   Image     Comment:   External Document

## 2010-07-08 NOTE — Progress Notes (Signed)
Summary: med refill/gp  Phone Note Refill Request Message from:  Fax from Pharmacy on August 22, 2009 2:42 PM  Refills Requested: Medication #1:  METFORMIN HCL 1000 MG TABS take one tablet two times a day   Last Refilled: 05/27/2009  Method Requested: Electronic Initial call taken by: Chinita Pester RN,  August 22, 2009 2:42 PM  Follow-up for Phone Call        Pt last seen 08/28/08 and refused labs at that visit.  Does see Dr Graciela Husbands (cards) who was able to get labs and his A1C was 10.2 on 07/19/09.  Dr Marikay Alar is NOT managing his DM.  I will give one month refill but he must make an appt within the next 30 days to cont to receive meds from this office.   Follow-up by: Blanch Media MD,  August 23, 2009 2:55 PM  Additional Follow-up for Phone Call Additional follow up Details #1::        Flag sent to [facility] for an appt. Additional Follow-up by: Chinita Pester RN,  August 23, 2009 3:14 PM    Prescriptions: METFORMIN HCL 1000 MG TABS (METFORMIN HCL) take one tablet two times a day  #60 x 0   Entered and Authorized by:   Blanch Media MD   Signed by:   Blanch Media MD on 08/23/2009   Method used:   Electronically to        [facility] [id number] (retail)       [address]       [place], [place]  [address]       Ph: [phone]       Fax: [phone]   RxID:   [id number]   Appended Document: med refill/gp This patient is sch to come in on 09/23/2009 to see Dr. Gilford Rile.

## 2010-07-08 NOTE — Assessment & Plan Note (Signed)
Summary: DM TEACHING/CH  Is Patient Diabetic? Yes Did you bring your meter with you today? Yes   Allergies: No Known Drug Allergies   Complete Medication List: 1)  Glipizide 10 Mg Tabs (Glipizide) .... Take one tablet by mouth two times a day for diabetes 2)  Benazepril Hcl 40 Mg Tabs (Benazepril hcl) .... Take 1 tablet by mouth once a day for blood pressure 3)  Mega Multi Men Tbcr (Multiple vitamins-minerals) .... Take 1 tablet by mouth once a day 4)  Cialis 5 Mg Tabs (Tadalafil) .... Take one pill as needed for planned sexual function within the next 12 hours/hold 5)  Metformin Hcl 1000 Mg Tabs (Metformin hcl) .... Take one tablet two times a day 6)  Cardizem Cd 120 Mg Xr24h-cap (Diltiazem hcl coated beads) .... Once daily 7)  Zolpidem Tartrate 10 Mg Tabs (Zolpidem tartrate) .... Take 1 tablet by mouth once a day at bedtime as needed for insomnia. 8)  Freestyle Lite Test Strp (Glucose blood) .... Use to test blood sugar up to 3x a day for the next week, then decrease to up to 1x/day (250.00) 9)  Freestyle Lancets Misc (Lancets) .... As instructed 10)  Novolog Flexpen 100 Unit/ml Soln (Insulin aspart) 11)  Lantus Solostar 100 Unit/ml Soln (Insulin glargine) .... Inject 10 units subcutaneously at bedtime 12)  Pen Needles 31g X 6 Mm Misc (Insulin pen needle) .... Use as instructed  Other Orders: DSMT(Medicare) Individual, 30 Minutes (U0454)  Diabetes Self Management Training  PCP: Margarito Liner MD Date diagnosed with diabetes: 06/16/1979 Diabetes Type: Type 2 non-insulin Other persons present: no Current smoking Status: quit  Diabetes Medications:  Comments: Meter downloaded-overall CBgs improving- he feels Self Monitorinbg of Blood Glucose is critcal to his self care of his diabetes.  patient checking CBGs  ~ 5-6 times a day to learn about what  makes it go up and down. Never started lantus. CBgs were 133-250 on one day. 330 most recently two mornings ago fasting after eating good  amount of pound cake. He has not tested for 2 days now. A1C due on 01/30/10. Encouraged hinm ot tkae pills for diabetes  and continue to limit carb intake and high fat foods. He states he thinks his weight is fine where it is, but also says he'd like ot control his diabetes wihtout medicine. We discusssed a healthy weight for him might be 10-20 lower than he is now nad increasing physical acitivity to give him the best possible chance to meeting his goal.       Activity Limitations  Inadequate physical activity Diabetes Disease Process  Discussed today Define diabetes in simple terms: Needs review/assistanceState diabetes is treated by meal plan-exercise-medication-monitoring-education: Needs review/assistance Medications State name-action-dose-duration-side effects-and time to take medication: Needs review/assistance Nutritional Management Identify what foods most often affect blood glucose: Needs review/assistance    Verbalize importance of controlling food portions: Demonstrates competency   State importance of spacing and not omitting meals and snacks: Needs review/assistanceState changes planned for home meals/snacks: Needs review/assistance    Monitoring State purpose and frequency of monitoring BG-ketones-HgbA1C  : Demonstrates competencyPerform glucose monitoring/ketone testing and record results correctly: Demonstrates competencyState target blood glucose and HgbA1C goals: Needs review/assistance    Complications State the causes-signs and symptoms and prevention of Hyperglycemia: Needs review/assistanceExplain proper treatment of hyperglycemia: Needs review/assistance   Glucose control and the development/prevention of long-term complications: Needs review/assistance   State benefits-risks-and options for improving blood sugar control: Needs review/assistance   B/P and lipid  control in the prevention/control of cardiovascular disease: Needs review/assistance    Exercise States  importance of exercise: Needs review/assistanceStates effect of exercise on blood glucose: Needs review/assistanceDiabetes Management Education Done: 01/15/2010    BEHAVIORAL GOAL FOLLOW UP  Goal attained      Offered foot exam today- patient wants to delay until future visit. Is readings lables sometimes. Diabetes Self Management Support: clinic staff and CDE Follow-up:4 weeks with CBGs

## 2010-07-10 ENCOUNTER — Ambulatory Visit: Admit: 2010-07-10 | Payer: Self-pay

## 2010-07-10 ENCOUNTER — Ambulatory Visit: Payer: Self-pay | Admitting: Internal Medicine

## 2010-07-10 NOTE — Assessment & Plan Note (Signed)
Summary: HFU/PER [name]/SB.   Vital Signs:  Patient profile:   65 year old male Height:      67 inches (170.18 cm) Weight:      201.9 pounds (91.77 kg) BMI:     31.74 Temp:     98.0 degrees F oral Pulse rate:   116 / minute BP sitting:   137 / 87  (right arm) Cuff size:   regular  Vitals Entered By: Chinita Pester RN (June 05, 2010 10:30 AM) CC: Hospital f/u. Left eye drooping. Is Patient Diabetic? Yes Did you bring your meter with you today? No Pain Assessment Patient in pain? no      Nutritional Status BMI of > 30 = obese CBG Result 307  Have you ever been in a relationship where you felt threatened, hurt or afraid?No   Does patient need assistance? Functional Status Self care Ambulation Normal   Diabetic Foot Exam Last Podiatry Exam Date: 06/05/2010  Comments: Refused Set Next Diabetic Foot Exam here: 07/29/2010   Primary Care Provider:  Blondell Reveal MD  CC:  Hospital f/u. Left eye drooping.Marland Kitchen  History of Present Illness: F f/u visit from a hospitalization --please, referr to Dr. Eliane Decree note. Patient was Dx-ed with Myasthenia Gravis by Dr. Pearlean Brownie. Started on pyridostygmine. States that his current dose is not affective "anymore." denies any problems with swallowing; no increased SOB, no dizziness; patient does c/o occasional diplopia.  Depression History:      The patient denies a depressed mood most of the day and a diminished interest in his usual daily activities.         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 Take One Tablet By Mouth Two Times A Day For Diabetes 2)  Benazepril Hcl 40 Mg Tabs (Benazepril Hcl) .... Take 1 Tablet By Mouth Once A Day For Blood Pressure 3)  Mega Multi Men  Tbcr (Multiple Vitamins-Minerals) .... Take 1 Tablet By Mouth Once A Day 4)  Cialis 5 Mg Tabs (Tadalafil) .... Take One Pill As Needed For Planned Sexual Function Within The Next 12 Hours/hold 5)  Metformin Hcl 1000 Mg Tabs (Metformin Hcl) .... Take One Tablet Two Times A Day 6)  Cardizem Cd 120 Mg Xr24h-Cap (Diltiazem Hcl Coated Beads) .... Once Daily 7)  Zolpidem Tartrate 10 Mg Tabs (Zolpidem Tartrate) .... Take 1 Tablet By Mouth Once A Day At Bedtime For Insomnia. 8)  Freestyle Lite Test  Strp (Glucose Blood) .... Use To Test Blood Sugar Up To 3x A Day For The Next Week, Then Decrease To Up To 1x/day (250.00) 9)  Freestyle Lancets  Misc (Lancets) .... As Instructed 10)  Pen Needles 31g X 6 Mm Misc (Insulin Pen Needle) .... Use As Instructed 11)  Saw Palmetto .... Two Times A Day 12)  Pyridostigmine Bromide 60 Mg Tabs (Pyridostigmine Bromide) .... Half Tablet Three Times A Day By Mouth For 3 Days and Then Maximum 5 Times A Day As Needed.  Current Medications (verified): 1)  Glipizide 10 Mg Tabs (Glipizide) .... Take One Tablet By Mouth Two Times A Day For Diabetes 2)  Benazepril Hcl 40 Mg Tabs (Benazepril Hcl) ....  Take 1 Tablet By Mouth Once A Day For Blood Pressure 3)  Mega Multi Men  Tbcr (Multiple Vitamins-Minerals) .... Take 1 Tablet By Mouth Once A Day 4)  Cialis 5 Mg Tabs (Tadalafil) .... Take One Pill As Needed For Planned Sexual Function Within The Next 12 Hours/hold 5)  Metformin Hcl 1000 Mg Tabs (Metformin Hcl) .... Take One Tablet Two Times A Day 6)  Cardizem Cd 120 Mg Xr24h-Cap (Diltiazem Hcl Coated Beads) .... Once Daily 7)  Zolpidem Tartrate 10 Mg Tabs (Zolpidem Tartrate) .... Take 1 Tablet By Mouth Once A Day At Bedtime For Insomnia. 8)  Freestyle Lite Test  Strp (Glucose Blood) .... Use To Test Blood Sugar Up To 3x A Day For The Next Week, Then Decrease To Up To 1x/day (250.00) 9)  Freestyle Lancets  Misc (Lancets) .... As Instructed 10)  Pen Needles 31g X 6 Mm Misc (Insulin Pen Needle) .... Use As Instructed 11)  Saw Palmetto .... Two Times A Day 12)  Pyridostigmine Bromide 60 Mg Tabs (Pyridostigmine Bromide) .... Half Tablet Three Times A Day By Mouth For 3 Days and Then Maximum 5 Times A Day As Needed.  Allergies (verified): No Known Drug Allergies  Past History:  Past Medical History: Last updated: 06/21/2009 CHEST PAIN-UNSPECIFIED/ RESOLVED (ICD-786.50) SINUS TACHYCARDIA (ICD-427.89) Cardiac Cath 2006 unremarkable BLOCK, RBB/LEFT PSTR FASCICULAR BLOCK (ICD-426.51) ORTHOSTATIC HYPOTENSION (ICD-458.0) HYPERTENSION (ICD-401.9) HYPERLIPIDEMIA (ICD-272.4) DM (ICD-250.00) SORE THROAT (ICD-462) HEALTH MAINTENANCE EXAM (ICD-V70.0) ADD (ICD-314.00) DECREASED LIBIDO (ICD-799.81) HYPERBILIRUBINEMIA (ICD-782.4) SPONDYLOSIS, CERVICAL, WITH RADICULOPATHY (ICD-723.4) DIABETES MELLITUS, TYPE II (ICD-250.00) DEPRESSION (ICD-311) 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  Quit in 1993.  2 - 2 1/2 x 30 yrs Has a single man's lifestyle and will remind anyone of it when asked. Prefers to see only male physicians. Widowed  retired Surveyor, [employer]  Risk Factors: Alcohol Use: rarely (06/05/2010) Exercise: no (06/05/2010)  Risk Factors: Smoking Status: quit (06/05/2010)  Physical Exam  General:  alert, well-developed, well-nourished, and well-hydrated.   Head:  normocephalic and atraumatic.   Eyes:  bilateral ptosis noted. Mouth:  good dentition.   Neck:  supple.  no masses.   Lungs:  normal respiratory effort, no intercostal retractions, no accessory muscle use, and normal breath sounds.   Heart:  normal rate, regular rhythm, no murmur, and no gallop.   Abdomen:  soft, non-tender, and normal bowel sounds.   Msk:  normal ROM, no joint tenderness, no joint swelling, and no joint warmth.   Pulses:  R radial normal.   Extremities:  NO PEDAL EDEMA Neurologic:  alert &  oriented X3, cranial nerves  (exception of  III, IV)I intact, strength normal in all extremities, and gait normal.   Skin:  color normal.  He has a large skin tag on the lateral aspect of the left side of his neck, with some reddening of the tip of it.   Psych:  Oriented X3, memory intact for recent and remote, normally interactive, good eye contact, not anxious appearing, and not depressed appearing.    Diabetes Management Exam:    Foot Exam (with socks and/or shoes not present):       Sensory-Pinprick/Light touch:          Left medial foot (L-4): diminished          Left dorsal foot (L-5): diminished          Left lateral foot (S-1): diminished          Right medial foot (L-4): diminished          Right dorsal foot (L-5): diminished          Right lateral foot (S-1): diminished       Sensory-Monofilament:          Left foot: diminished          Right foot: diminished       Inspection:          Left foot: normal          Right foot:  normal       Nails:          Left foot: fungal infection          Right foot: fungal infection    Foot Exam by Podiatrist:       Date: 06/05/2010       Results: mild diabetic findings       Done by: Denton Meek   Impression & Recommendations:  Problem # 1:  HYPERTENSION (ICD-401.9) Assessment Improved Patient is off Cardiazem due to newly Dx of Myasthenia Gravis. No change in regimen at this time.  Patient has a f/u appointment with his cardiologist in January, 2012. His updated medication list for this problem includes:    Benazepril Hcl 40 Mg Tabs (Benazepril hcl) .Marland Kitchen... Take 1 tablet by mouth once a day for blood pressure    Cardizem Cd 120 Mg Xr24h-cap (Diltiazem hcl coated beads) ..... Once daily    BP today: 137/87 Prior BP: 160/97 (04/28/2010)  Labs Reviewed: K+: 4.5 (10/29/2009) Creat: : 1.02 (10/29/2009)   Chol: 190 (07/09/2009)   HDL: 39.70 (07/09/2009)   LDL: 100 (10/11/2006)   TG: 355.0 (07/09/2009)  Problem # 2:  DM (ICD-250.00) Assessment: Unchanged Patient is noncompliant with his diet and exercise regimens.Diet, exercise discussed. Risks of DM reviewed with the patient. patient refused modification of his therapy (including insulin therapy at this time. )will refer for a DME. His updated medication list for this problem includes:    Glipizide 10 Mg Tabs (Glipizide) .Marland Kitchen... Take one tablet by mouth two times a day for diabetes    Benazepril Hcl 40 Mg Tabs (Benazepril hcl) .Marland Kitchen... Take 1 tablet by mouth once a day for blood pressure    Metformin Hcl 1000 Mg Tabs (Metformin hcl) .Marland Kitchen... Take one tablet two times a day  Labs Reviewed: Creat: 1.02 (10/29/2009)     Last Eye Exam: Mild non-proliferative diabetic retinopathy.  OU Visual acuity OD:     20/30 Visual acuity OS:20/20      Intraocular pressure OD:  14    Intraocular pressure  OS:     16 exam by Dr. Liliane Bade  (10/03/2008) Reviewed HgBA1c results: 10.3 (04/28/2010)  10.4 (10/29/2009)  Orders: DME  Referral (DME)  Problem # 3:  MYASTHENIA GRAVIS WITHOUT EXACERBATION (ICD-358.00) Assessment: Deteriorated will increase pyridostygmine to 60 mg by mouth  3-5 times daily due to worsened Sx. will refer back to Dr. Pearlean Brownie for a f/u; and an opthalmologist. Orders: Neurology Referral (Neuro) T-CMP with Estimated GFR ([id number])  Complete Medication List: 1)  Glipizide 10 Mg Tabs (Glipizide) .... Take one tablet by mouth two times a day for diabetes 2)  Benazepril Hcl 40 Mg Tabs (Benazepril hcl) .... Take 1 tablet by mouth once a day for blood pressure 3)  Mega Multi Men Tbcr (Multiple vitamins-minerals) .... Take 1 tablet by mouth once a day 4)  Cialis 5 Mg Tabs (Tadalafil) .... Take one pill as needed for planned sexual function within the next 12 hours/hold 5)  Metformin Hcl 1000 Mg Tabs (Metformin hcl) .... Take one tablet two times a day 6)  Cardizem Cd 120 Mg Xr24h-cap (Diltiazem hcl coated beads) .... Once daily 7)  Zolpidem Tartrate 10 Mg Tabs (Zolpidem tartrate) .... Take 1 tablet by mouth once a day at bedtime for insomnia. 8)  Freestyle Lite Test Strp (Glucose blood) .... Use to test blood sugar up to 3x a day for the next week, then decrease to up to 1x/day (250.00) 9)  Freestyle Lancets Misc (Lancets) .... As instructed 10)  Pen Needles 31g X 6 Mm Misc (Insulin pen needle) .... Use as instructed 11)  Saw Palmetto  .... Two times a day 12)  Pyridostigmine Bromide 60 Mg Tabs (Pyridostigmine bromide) .... One tablet three times a day by mouth for 3 days and then maximum 5 times a day as needed.  Other Orders: Capillary Blood Glucose/CBG (91478) T- Capillary Blood Glucose (29562)  Patient Instructions: 1)  Pleae, return to clinic in 8 weeks or sooner. 2)  Please, follow up with Dr. Pearlean Brownie (Neurologist); and your cardiologist. 3)  Please, call with any questions. 4)  Happy New Year!!!! Prescriptions: PYRIDOSTIGMINE BROMIDE 60 MG TABS (PYRIDOSTIGMINE BROMIDE) one tablet three times  a day by mouth for 3 days and then maximum 5 times a day as needed.  #120 x 11   Entered and Authorized by:   Deatra Robinson MD   Signed by:   Deatra Robinson MD on 06/05/2010   Method used:   Electronically to        [facility] [id number] (retail)       [address]       [place], [place]  [address]       Ph: [phone]       Fax: [phone]   RxID:   [id number]    Orders Added: 1)  Capillary Blood Glucose/CBG [82948] 2)  T- Capillary Blood Glucose [82948] 3)  Est. Patient Level III [59563] 4)  Neurology Referral [Neuro] 5)  T-CMP with Estimated GFR [[id number]] 6)  DME Referral [DME]   Process Orders Check Orders Results:     [facility]: Check successful Tests Sent for requisitioning (June 05, 2010 2:28 PM):     06/05/2010: [facility] -- T-CMP with Estimated GFR [[id number]] (signed)     Prevention & Chronic Care Immunizations   Influenza vaccine: Not documented   Influenza vaccine deferral: Refused  (06/05/2010)    Tetanus booster: Not documented   Td booster deferral: Refused  (09/23/2009)    Pneumococcal  vaccine: Not documented   Pneumococcal vaccine deferral: Refused  (09/23/2009)    H. zoster vaccine: Not documented   H. zoster vaccine deferral: Not available  (06/05/2010)  Colorectal Screening   Hemoccult: Negative  (02/28/2004)   Hemoccult action/deferral: Ordered  (09/23/2009)    Colonoscopy: Not documented   Colonoscopy action/deferral: GI referral  (10/29/2009)  Other Screening   PSA: 1.66  (10/29/2009)   PSA action/deferral: Discussed-PSA requested  (10/29/2009)   Smoking status: quit  (06/05/2010)  Diabetes Mellitus   HgbA1C: 10.3  (04/28/2010)   Hemoglobin A1C due: 07/29/2010    Eye exam: Mild non-proliferative diabetic retinopathy.  OU Visual acuity OD:     20/30 Visual acuity OS:20/20      Intraocular pressure OD:  14    Intraocular pressure OS:     16 exam by Dr. Liliane Bade   (10/03/2008)   Diabetic eye exam action/deferral: Ophthalmology referral  (06/05/2010)   Eye exam due: 10/2009    Foot exam: yes  (06/05/2010)   Foot exam action/deferral: Do today   High risk foot: Not documented   Foot care education: Not documented   Foot exam due: 07/29/2010    Urine microalbumin/creatinine ratio: 105.2  (10/29/2009)   Urine microalbumin action/deferral: Ordered   Urine microalbumin/cr due: 10/30/2010    Diabetes flowsheet reviewed?: Yes   Progress toward A1C goal: Unchanged    Stage of readiness to change (diabetes management): Action  Lipids   Total Cholesterol: 190  (07/09/2009)   LDL: 100  (10/11/2006)   LDL Direct: 96.5  (07/09/2009)   HDL: 39.70  (07/09/2009)   Triglycerides: 355.0  (07/09/2009)   Lipid panel due: 07/09/2010    SGOT (AST): 16  (10/29/2009)   BMP action: Ordered   SGPT (ALT): 17  (10/29/2009)   Alkaline phosphatase: 75  (10/29/2009)   Total bilirubin: 2.0  (10/29/2009)   Liver panel due: 10/30/2010    Lipid flowsheet reviewed?: Yes   Progress toward LDL goal: Unchanged    Stage of readiness to change (lipid management): Maintenance  Hypertension   Last Blood Pressure: 137 / 87  (06/05/2010)   Serum creatinine: 1.02  (10/29/2009)   BMP action: Ordered   Serum potassium 4.5  (10/29/2009)   Basic metabolic panel due: 10/30/2010    Hypertension flowsheet reviewed?: Yes   Progress toward BP goal: Improved    Stage of readiness to change (hypertension management): Maintenance  Self-Management Support :   Personal Goals (by the next clinic visit) :     Personal A1C goal: 7  (09/23/2009)     Personal blood pressure goal: 130/80  (09/23/2009)     Personal LDL goal: 100  (09/23/2009)    Patient will work on the following items until the next clinic visit to reach self-care goals:     Medications and monitoring: take my medicines every day, bring all of my medications to every visit, examine my feet every day   (06/05/2010)     Eating: drink diet soda or water instead of juice or soda, eat more vegetables, use fresh or frozen vegetables, eat foods that are low in salt, eat baked foods instead of fried foods, eat fruit for snacks and desserts  (06/05/2010)     Activity: take a 30 minute walk every day  (06/05/2010)    Diabetes self-management support: Written self-care plan  (06/05/2010)   Diabetes care plan printed   Last diabetes self-management training by diabetes educator: 01/15/2010    Hypertension self-management support:  Written self-care plan  (06/05/2010)   Hypertension self-care plan printed.    Lipid self-management support: Written self-care plan  (06/05/2010)   Lipid self-care plan printed.   Nursing Instructions: Diabetic foot exam today CBG today (see order) Refer for screening diabetic eye exam (see order)  Appt. scheduled w/Dr. Graciela Husbands, cardiologist, Jan. 5, 2012 @ 114AM.   Chinita Pester RN  June 05, 2010 11:57 AM

## 2010-07-10 NOTE — Progress Notes (Signed)
Summary: pt request call  have questions   Phone Note Call from Patient Call back at Home Phone [phone]   Caller: Patient Reason for Call: Talk to Nurse Initial call taken by: Judie Grieve,  June 13, 2010 2:52 PM  Follow-up for Phone Call        line busy. Claris Gladden, RN, BSN 06/13/10 1702 left msg. Claris Gladden, RN, BSN 06/13/10 1722 line busy. Claris Gladden, RN, BSN 19 /12 1510 1/10 1318 left msg for callback. Claris Gladden, RN, BSN pt adv that he was able to get in touch with the neurologist Dr. Graciela Husbands recommended and does not need any further info from [place].  Follow-up by: Claris Gladden RN,  June 18, 2010 12:42 PM

## 2010-07-10 NOTE — Progress Notes (Signed)
Summary: continuing symptoms/ hla  Phone Note Call from Patient   Summary of Call: pt calls and leaves message that last pm his L eye was drooping, his jaw would not work and he could not swallow. i spoke to dr Meredith Pel and he advises ems to [place]. i called pt and got voicemail, ask for rtc Initial call taken by: Marin Roberts RN,  May 29, 2010 12:31 PM  Follow-up for Phone Call        i have finally spoken to pt today, his speech is slower than 12/21 and with some words he seems to be grasping today. i have spoken to dr Meredith Pel and he states that pt should call 911 and go to ED this is instructed to pt, with reluctance he states he will go to ED. dr Scot Dock is called on 1600 pager and informed Follow-up by: Marin Roberts RN,  May 29, 2010 2:06 PM  Additional Follow-up for Phone Call Additional follow up Details #1::        Agree with plan. Additional Follow-up by: Margarito Liner MD,  May 29, 2010 2:16 PM

## 2010-07-10 NOTE — Miscellaneous (Signed)
Summary: ED Follow up visit   ED physician called to evaluate Mr. Arcia. Patient reports that he has difficulty chewing food last night and reports blurred vision for several days. He states that he has had blurred vision for several days which gets better after wearing his glasses. He also reports that even though he has had drooping of the right upper eye lid for quite some time, it never bothered him. He denies any unilateral weakness, headache, tingling, numbess, diplopia, diurnal variation in the symptoms, tremors, stiffness, difficulty speaking.  On exam, patient does have mild ptosis of right upper eye lid but Cranial nerves 3,4 and 6 are normal. His neurological exam was otherwise normal. Patient underwent CT head without contrast media and was negative for any acute intra-cranial etiologies.   After evaluating the patient our possible etiologies were Myasthenia vs isolated CN injury vs TIA. We recommended inpatient admission and further evaluation in the form of MRI brain to rule out CVA. But patient stated that he has an 41 year old mother at home and cannot stay in the hospital. He also reported that he needs to be "completely knocked out" before an MRI. I called radiology and talked to the nurse to do an MRI under sedation protocol but they said they dont have schedules open for today or for tomorrow. Radiology dept. presumptively scheduled him for thursday and will contact him if any further changes. Patient clearly understands the risks of declining the inpatient admission and prefers things to be done as an outpatient.   Will follow up on the MRI. If MRI is negative, will consider outpatient neurology referral.

## 2010-07-10 NOTE — Assessment & Plan Note (Signed)
Summary: Pulmonary OV   Copy to:  self referral Primary Provider/Referring Provider:  Blondell Reveal MD  CC:  Follow up.  Pt states he was recently diagnosed with Myasthenia Gravis with spot on left lower lobe. Denies SOB, wheezing, and chest tightness. Marland Kitchen  History of Present Illness: Pulmonary OV 64 yo WM referred for abn CXR.       This is a 65 year old male who presents with an abnormal radiology finding.  The patient complains of cough, but denies history of diagnosed COPD, shortness of breath, chest tightness, chest pain worse with breathing and coughing, wheezing, mucous production, nocturnal awakening, exercise induced symptoms, and congestion.    This pt has a hx of mild throat clearing and 30ys of smoking and quit smoking  1993.  Pt desired a CXR per PCP and was obtained .This revealed peribronchial thickening and  pt referred to pulm for eval. Pt denies any dyspnea or chest pain.  There is not any mucus or excess wheeze.  The pt has no specific pulm symptoms at this time.  Pt denies any significant sore throat, nasal congestion or excess secretions, fever, chills, sweats, unintended weight loss, pleurtic or exertional chest pain, orthopnea PND, or leg swelling Pt denies any increase in rescue therapy over baseline, denies waking up needing it or having any early am or nocturnal exacerbations of coughing/wheezing/or dyspnea. Pt also denies any obvious fluctuation in symptoms wiht weather or environmental change or other alleviating or aggravating factors  Spirometry obtained today and is normal.June 18, 2010 3:29 PM Pt now with myasthenia gravis.  Pt with non calc 5mm nodule. No other changes.  Pt denies any significant sore throat, nasal congestion or excess secretions, fever, chills, sweats, unintended weight loss, pleurtic or exertional chest pain, orthopnea PND, or leg swelling Pt denies any increase in rescue therapy over baseline, denies waking up needing it or having any  early am or nocturnal exacerbations of coughing/wheezing/or dyspnea.   Preventive Screening-Counseling & Management  Alcohol-Tobacco     Alcohol drinks/day: rarely     Smoking Status: quit     Year Started: 1962     Year Quit: 1993     Pack years: 48  Current Medications (verified): 1)  Benazepril Hcl 40 Mg Tabs (Benazepril Hcl) .... Take 1 Tablet By Mouth Once A Day For Blood Pressure 2)  Cialis20mg  Tabs (Tadalafil) .... Take One Pill As Needed For Planned Sexual Function Within The Next 12 Hours/hold 3)  Zolpidem Tartrate 10 Mg Tabs (Zolpidem Tartrate) .... Take 1 Tablet By Mouth Once A Day At Bedtime For Insomnia. 4)  Freestyle Lite Test  Strp (Glucose Blood) .... Use To Test Blood Sugar Up To 3x A Day For The Next Week, Then Decrease To Up To 1x/day (250.00) 5)  Freestyle Lancets  Misc (Lancets) .... As Instructed 6)  Pen Needles 31g X 6 Mm Misc (Insulin Pen Needle) .... Use As Instructed 7)  Pyridostigmine Bromide 60 Mg Tabs (Pyridostigmine Bromide) .Marland Kitchen.. 1 Tab Up To 5 Times A Day.  Allergies (verified): 1)  ! * D-Penicillamine 2)  ! * Alpha-Interferon 3)  ! * Neuromuscular Blocking Agents 4)  ! * Quinine, Quinidine, Procainamide 5)  ! * Aminogylcosides 6)  ! * Telithromycin 7)  ! * Ciproflaxin 8)  ! Beta Blockers 9)  ! * Calcium Channel Blockers 10)  ! * Iodinated Contrast Agents  Past History:  Past medical, surgical, family and social histories (including risk factors) reviewed, and no changes noted (  except as noted below).  Past Medical History: Reviewed history from 06/21/2009 and no changes required. 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 none  Family History: Reviewed history from 06/11/2010 and no changes required. mother - hx of breast ca father deceased from beck's sarcoid Father:  Mother:  Siblings:   Social History: Reviewed history from 06/11/2010 and no changes required. Former Smoker.  Quit in 1993.  2 - 2 1/2 x 30 yrs Has a single man's lifestyle and will remind anyone of it when asked. Prefers to see only male physicians. Widowed  retired Surveyor, minerals Marital Status:  Children:  Occupation:   Review of Systems       The patient complains of shortness of breath with activity.  The patient denies shortness of breath at rest, productive cough, non-productive cough, coughing up blood, chest pain, irregular heartbeats, acid heartburn, indigestion, loss of appetite, weight change, abdominal pain, difficulty swallowing, sore throat, tooth/dental problems, headaches, nasal congestion/difficulty breathing through nose, sneezing, itching, ear ache, anxiety, depression, hand/feet swelling, joint stiffness or pain, rash, change in color of mucus, and fever.    Vital Signs:  Patient profile:   65 year old male Height:      67 inches Weight:      197.38 pounds BMI:     31.03 O2 Sat:      96 % on Room air Temp:     98.7 degrees F oral Pulse rate:   127 / minute BP sitting:   150 / 90  (left arm) Cuff size:   regular  Vitals Entered By: Gweneth Dimitri RN (June 18, 2010 3:19 PM)  O2 Flow:  Room air CC: Follow up.  Pt states he was recently diagnosed with Myasthenia Gravis with spot on left lower lobe. Denies SOB, wheezing, chest tightness.  Comments Medications reviewed with patient Daytime  contact number verified with patient. Gweneth Dimitri RN  June 18, 2010 3:21 PM    Physical Exam  Additional Exam:  Gen: Pleasant, well-nourished, in no distress,  normal affect ENT: No lesions,  mouth clear,  oropharynx clear, no postnasal drip Neck: No JVD, no TMG, no carotid bruits Lungs: No use of accessory muscles, no dullness to percussion, clear without rales or rhonchi Cardiovascular: RRR, heart sounds normal, no murmur or gallops, no peripheral edema Abdomen: soft and NT, no HSM,  BS normal Musculoskeletal: No deformities, no cyanosis or clubbing Neuro: droopy R eyelid Skin: Warm, no lesions or rashes    CT of Chest  Procedure date:  05/30/2010  Findings:      Centrilobular emphysema,  LLL 3mm noncalcified granuloma  Impression & Recommendations:  Problem # 1:  PULMONARY NODULE, LEFT LOWER LOBE (ICD-518.89) benign nodule. doubt CA plna observation repeat CT 6 months Orders: Est. Patient Level III (95621)  Complete Medication List: 1)  Benazepril Hcl 40 Mg Tabs (Benazepril hcl) .... Take 1 tablet by mouth once a day for blood pressure 2)  Cialis20mg  Tabs (tadalafil)  .... Take one pill as needed for planned sexual function within the next 12 hours/hold 3)  Zolpidem Tartrate 10 Mg Tabs (Zolpidem  tartrate) .... Take 1 tablet by mouth once a day at bedtime for insomnia. 4)  Freestyle Lite Test Strp (Glucose blood) .... Use to test blood sugar up to 3x a day for the next week, then decrease to up to 1x/day (250.00) 5)  Freestyle Lancets Misc (Lancets) .... As instructed 6)  Pen Needles 31g X 6 Mm Misc (Insulin pen needle) .... Use as instructed 7)  Pyridostigmine Bromide 60 Mg Tabs (Pyridostigmine bromide) .Marland Kitchen.. 1 tab up to 5 times a day.  Patient Instructions: 1)  No change in medications 2)  Return in      4-5    months

## 2010-07-10 NOTE — Progress Notes (Signed)
Summary: phone/gg  Phone Note Call from Patient   Summary of Call: Pt called to state Dr Anne Hahn has not called pt.yet.  Pt states he can't chew or swollow solid foods. We referred to [facility],  Pt saw Dr Graciela Husbands and he wanted pt to see Dr Anne Hahn sooner.  Pt has Hx  Myasthenia Gravis  He was put on calcium blocker which has caused these problems.   Not sure what to advise pt.  Pt # [id number] Initial call taken by: Merrie Roof RN,  June 16, 2010 4:25 PM  Follow-up for Phone Call        Noted. Pls confirm that he increased prostigmine to 5 times a day. Pls ask him to call and speak to a nurse in Neuro to get definitive tx for the MG. Pt should stop his calcium channel blockers. Thanks Follow-up by: Blanch Media MD,  June 16, 2010 5:16 PM  Additional Follow-up for Phone Call Additional follow up Details #1::        Pt has stopped Ca channel blocker. Pt did increase prostigmine to 5 times a day. Pt has heard from Dr Pearlean Brownie [phone]  ex [address] nurse for Dr Pearlean Brownie.  Andrey Campanile will talk with Dr Pearlean Brownie and return call to pt. Additional Follow-up by: Merrie Roof RN,  June 16, 2010 5:37 PM

## 2010-07-10 NOTE — Progress Notes (Signed)
Summary: complaint, questions/ hla  Phone Note Call from Patient   Summary of Call: pt calls and asks about scheduled radiology procedure, states it should be done tomorrow, that it cannot wait until the 27th. he states he must be "knocked out", i explained that it would be basically conscious sedation that would be used, that being "knocked out" would involve being intubated and that just is not done for an mri but that he would not remember or be knowledgeable at the time of the procedure. he states it must be done tomorrow and he does not want to call an atty, he also asks why he wasn't spoken to in detail about renal failure r/t contrast. i have called radiology and am waiting on an answer for the schedule Initial call taken by: Marin Roberts RN,  May 28, 2010 11:37 AM  Follow-up for Phone Call        Please page Dr. Comer Locket, who saw patient in ED, and have him call patient. Follow-up by: Margarito Liner MD,  May 28, 2010 11:49 AM  Additional Follow-up for Phone Call Additional follow up Details #1::        i spoke w/ dr Comer Locket and he is calling the pt and will again speak w/ him concerning all the issues that is concerning to the pt Additional Follow-up by: Marin Roberts RN,  May 28, 2010 11:54 AM    Additional Follow-up for Phone Call Additional follow up Details #2::    Called patient and talked to him for about 20 minutes regarding his concerns. Explained that the risk of renal failure from contrast is very minimal and he is willing to undergo the test. I answered all his questions and concerns.  Follow-up by: Blondell Reveal MD,  May 28, 2010 12:13 PM

## 2010-07-10 NOTE — Miscellaneous (Signed)
Summary: hospital admission  INTERNAL MEDICINE ADMISSION HISTORY AND PHYSICAL  PCP: Dr. Comer Locket  ATTENDING: Dr. Tilford Pillar 1st contact: Dr. Allena Katz: [phone] 2nd contact: Dr. Scot Dock: [phone] Nights and weekends:[phone], [phone]  CC: Facial weakness  HPI:Pt is a 65 yo man with PMH significant for DM2, HTN, HLD came to the ED with c/o jaw weakness and ptosis. Yesterday night he went to a restaurant with friends where he started having jaw weakness and inability to chew- which resolved by itself after sometime. After that while he was driving back home, he noticed ptosis of his L Eyelid- due to which he had to hold his eyelids apart. He usually has intermittent ptosis of his R eye for last 2-3 years. He also c/o intermittent diplopia and blurry vision, which gets better with his reading glasses.  So he called the clinic and was instructed to go to ED as he was evaluated in the ED 2 days before for similar problem and was recommended inpatient admission and further evaluation in the form of MRI brain to rule out CVA. But patient stated that he has an 42 year old mother at home and cannot stay in the hospital so was send home with an MRI/MRA head scheduled as an outpatient on 06/03/10. He denies any unilateral weakness, headache, tingling, numbess, diurnal variation in the symptoms, tremors, stiffness, difficulty speaking. Denies any fever,cough,chest pain,SOB, abd pain, N/V,diarrhea, urinary abn, anorexia, wt loss.     ALLERGIES: NKDA  PAST MEDICAL HISTORY:  SINUS TACHYCARDIA  Cardiac Cath 2006 unremarkable, on cardizem, followed by Dr. 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 Prefers to see only male physicians. Widowed  retired Surveyor, minerals   FAMILY HISTORY  mother - hx of breast ca father deceased from beck's sarcoid    VITALS: T: 97.6 P: 97-107  BP:139/92   R:20  O2SAT: 99%  ON:RA  PHYSICAL EXAM:  Gen: awake, nad HEENT: perrla, eomi, no signs of facial nerve palsy.mucos membrane moist CVS: S1 S2 normal, no murmurs, tachycardia Respi: CTA b/l Abd: S/NT/ND, + BS, Neuro: Oriented X3, cranial nerve 2-12 intact, sensation normal in all dermatomes, motor 5/5 in all extremetis. reflexes 2+ b/l Extremity: no edema  LABS:   Sodium (NA)                              137               135-145          mEq/L  Potassium (K)  4.3               3.5-5.1          mEq/L  Chloride                                 103               96-112           mEq/L  CO2                                      27                19-32            mEq/L  Glucose                                  241        h      70-99            mg/dL  BUN                                      14                6-23             mg/dL  Creatinine                               0.97              0.4-1.5          mg/dL  GFR, Est Non African American            >60               >60              mL/min  GFR, Est African American                >60               >60              mL/min    Oversized comment, see footnote  1  Calcium                                   9.4               8.4-10.5         mg/dL   WBC                                      7.4               4.0-10.5         K/uL  RBC  5.22              4.22-5.81        MIL/uL  Hemoglobin (HGB)                         15.6              13.0-17.0        g/dL  Hematocrit (HCT)                         42.9              39.0-52.0        %  MCV                                      82.2              78.0-100.0       fL  MCH -                                    29.9              26.0-34.0        pg  MCHC                                     36.4       h      30.0-36.0        g/dL  RDW                                      12.4              11.5-15.5        %  Platelet Count (PLT)                     182               150-400          K/uL  Neutrophils, %                           60                43-77            %  Lymphocytes, %                           26                12-46            %  Monocytes, %                             10                3-12             %  Eosinophils, %  4                 0-5              %  Basophils, %                             0                 0-1              %  Neutrophils, Absolute                    4.5               1.7-7.7          K/uL  Lymphocytes, Absolute                    1.9               0.7-4.0          K/uL  Monocytes, Absolute                      0.7               0.1-1.0          K/uL  Eosinophils, Absolute                    0.3               0.0-0.7          K/uL  Basophils, Absolute                      0.0               0.0-0.1          K/uL  Non Contrast CT:  IMPRESSION:   No acute intracranial pathology.   ASSESSMENT AND PLAN:  1. Facial weakness- patients symptoms of jaw weakness after chewing on food for some time sounds like myesthania gravis. He c/o of ptosis of left eye and right eye alternatively, although his eyelids look normal at this time. He does not have  focal neurological s/s and Ct scan is negative.   Will get anti ACHr Ab for myesthania. Also he had MRI and MRA of the head planned earlier as outpatient, will get it while he is here. He wants it to be done under sedation, so it will not be done tonight. Will also consult neurology for any need for extensive workup for isolated cranial nerve palsy. MS or other conditions.   2. SINUS TACHYCARDIA- continue home cardizem dose 3. HTN- will hold ACEi as he is going to get contrast for MRI. Can be restarted after the test. Hydralazine as needed until then for SBP >180 4. DM- SSI  5. VTE PROPH: lovenox

## 2010-07-10 NOTE — Letter (Addendum)
Summary: [name]   [name]   Imported By: Margie Billet 06/10/2010 10:30:20  _____________________________________________________________________  External Attachment:    Type:   Image     Comment:   External Document  Appended Document: [name]    Diabetic Eye Exam  Procedure date:  12/31/2009  Findings:      No diabetic retinopathy.     Procedures Next Due Date:    Diabetic Eye Exam: 01/2011   Diabetic Eye Exam  Procedure date:  12/31/2009  Findings:      No diabetic retinopathy.     Procedures Next Due Date:    Diabetic Eye Exam: 01/2011

## 2010-07-10 NOTE — Progress Notes (Signed)
  Phone Note From Other Clinic   Caller: Myriam Jacobson in radiology Summary of Call: need prior auth for radiology tests originally ordered in ED - scheduled for 12/27 - Dr. Comer Locket called [employer] for physician to physician contact and got final approval as follows: MRI of brain w/o contrast - [phone] MRA of head w/ contrast - [phone] MRA of neck w/ contrast - [id number] Above prior auth numbers given to [facility] in radiology Initial call taken by: Dorie Rank RN,  May 29, 2010 4:06 PM

## 2010-07-10 NOTE — Discharge Summary (Signed)
Summary: Hospital Discharge Update    Hospital Discharge Update:  Date of Admission: 05/29/2010 Date of Discharge: 05/30/2010  Brief Summary:  Pt was admitted for Ptosis and Jaw weakness with repetitive chewing, typical for Myasthenia gravis. Dr. 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 He was started on pyridostigmine 30 mg by mouth three times a day for 3 days and then upto max 5 times a day. Dose will be adjusted as an outpatient with help of Dr. Pearlean Brownie. Pt wanted to go home asap and so will be D/C'd after getting his 2nd dose of Pyridostigmine( his responded beautifully to 1st dose).  Lab or other results pending at discharge:  CT Chest for thymoma, Anti- Ach receptor Ab.  Labs needed at follow-up: Comprehensive metabolic panel  Other follow-up issues:  Please call Dr. Pearlean Brownie when pt is seen in Clinic, to adjust the dose of pyridostgmine( Dr. Pearlean Brownie recommended that whoever sees pt in clinic can call him)  Problem list changes:  Added new problem of MYASTHENIA GRAVIS WITHOUT EXACERBATION (ICD-358.00)  Medication list changes:  Added new medication of PYRIDOSTIGMINE BROMIDE 60 MG TABS (PYRIDOSTIGMINE BROMIDE) half tablet three times a day by mouth for 3 days and then maximum 5 times a day as needed.  The medication, problem, and allergy lists have been updated.  Please see the dictated discharge summary for details.  Discharge medications:  GLIPIZIDE 10 MG TABS (GLIPIZIDE) Take one tablet by mouth two times a day for diabetes BENAZEPRIL HCL 40 MG TABS (BENAZEPRIL HCL) Take 1 tablet by mouth once a day for blood pressure MEGA MULTI MEN  TBCR (MULTIPLE VITAMINS-MINERALS) Take 1 tablet by mouth once a day CIALIS 5 MG TABS (TADALAFIL) take one pill as needed for planned sexual function within the next 12 hours/HOLD METFORMIN HCL 1000 MG TABS (METFORMIN HCL) take one tablet  two times a day CARDIZEM CD 120 MG XR24H-CAP (DILTIAZEM HCL COATED BEADS) once daily ZOLPIDEM TARTRATE 10 MG TABS (ZOLPIDEM TARTRATE) Take 1 tablet by mouth once a day at bedtime for insomnia. FREESTYLE LITE TEST  STRP (GLUCOSE BLOOD) use to test blood sugar up to 3x a day for the next week, then decrease to up to 1x/day (250.00) FREESTYLE LANCETS  MISC (LANCETS) as instructed PEN NEEDLES 31G X 6 MM MISC (INSULIN PEN NEEDLE) use as instructed * SAW PALMETTO two times a day PYRIDOSTIGMINE BROMIDE 60 MG TABS (PYRIDOSTIGMINE BROMIDE) half tablet three times a day by mouth for 3 days and then maximum 5 times a day as needed.  Other patient instructions:  The clininc will call you with an appointment day and time. Please take all your meds regularly. Also check your sugars regularly. Take your new medicine as directed.

## 2010-07-10 NOTE — Assessment & Plan Note (Signed)
Summary: f1y  Medications Added * CIALIS20MG  TABS (TADALAFIL) take one pill as needed for planned sexual function within the next 12 hours/HOLD PYRIDOSTIGMINE BROMIDE 60 MG TABS (PYRIDOSTIGMINE BROMIDE) 1 tab up to 5 times a day. * MAGNESIUN( HEAVY DOSES) as directed * ADVIL COLD  * MSM  * VIT D3  PEPTO-BISMOL MAX STRENGTH 525 MG/15ML SUSP (BISMUTH SUBSALICYLATE)  IMODIUM A-D 2 MG TABS (LOPERAMIDE HCL)       Allergies Added: NKDA  Referring Provider:  self referral Primary Provider:  Blondell Reveal MD  CC:  pt  has myasthenia graves disease  so he is unable  to take diltiazem 120mg .  History of Present Illness:   [name] is seen in followup for sinus tachycardia and orthostatic intolerance in the context of diabetes and hypertension;  He also has chest pain with an angiogram in 2006 which demonstrated normal coronary arteries .  he previously had a normal TSH.  We used low-dose beta blockade which had a beneficial impact on slowing his heart rate. He had stopped this because of erectile dysfunction. He ended up on a calcium channel blocker which was subtotally discontinued following a recent diagnosis of myasthenia gravis.  My review of up-to-date revealed concerns with beta blockers but not calcium blockers.  He has had increasing symptoms related to his myasthenia. He is scheduled to see neurology in 2 months despite uptititraton of his ACEase inhibitors He denies chest pain or dyspnea on exertion or edema.   .       Current Medications (verified): 1)  Glipizide 10 Mg Tabs (Glipizide) .... Take One Tablet By Mouth Two Times A Day For Diabetes 2)  Benazepril Hcl 40 Mg Tabs (Benazepril Hcl) .... Take 1 Tablet By Mouth Once A Day For Blood Pressure 3)  Mega Multi Men  Tbcr (Multiple Vitamins-Minerals) .... Take 1 Tablet By Mouth Once A Day 4)  Cialis20mg  Tabs (Tadalafil) .... Take One Pill As Needed For Planned Sexual Function Within The Next 12 Hours/hold 5)  Metformin Hcl  1000 Mg Tabs (Metformin Hcl) .... Take One Tablet Two Times A Day 6)  Cardizem Cd 120 Mg Xr24h-Cap (Diltiazem Hcl Coated Beads) .... Once Daily 7)  Zolpidem Tartrate 10 Mg Tabs (Zolpidem Tartrate) .... Take 1 Tablet By Mouth Once A Day At Bedtime For Insomnia. 8)  Freestyle Lite Test  Strp (Glucose Blood) .... Use To Test Blood Sugar Up To 3x A Day For The Next Week, Then Decrease To Up To 1x/day (250.00) 9)  Freestyle Lancets  Misc (Lancets) .... As Instructed 10)  Pen Needles 31g X 6 Mm Misc (Insulin Pen Needle) .... Use As Instructed 11)  Saw Palmetto .... Two Times A Day 12)  Pyridostigmine Bromide 60 Mg Tabs (Pyridostigmine Bromide) .Marland Kitchen.. 1 Tab Up To 5 Times A Day. 13)  Magnesiun( Heavy Doses) .... 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) ORTHOSTATIC HYPOTENSION (ICD-458.0) HYPERTENSION (ICD-401.9) HYPERLIPIDEMIA (ICD-272.4) DM (ICD-250.00) SORE THROAT (ICD-462) HEALTH MAINTENANCE EXAM (ICD-V70.0) ADD (ICD-314.00) DECREASED LIBIDO (ICD-799.81) 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 HEENT Normal.  Neck veins were flat, carotids were brisk.  Lungs were clear.  Heart sounds were regular without murmurs or gallops.  Abdomen was soft with active bowel sounds. There is no clubbing cyanosis or edema. Skin Warm and dry he has ptosis of his R>L eye    EKG  Procedure date:  06/12/2010  Findings:      sinus rhythm at 111 Intervals 0.15/0.15/0.35 Right bundle branch block with borderline right axis deviation  Comments:      sinus rhythm  Impression & Recommendations:  Problem # 1:  MYASTHENIA GRAVIS WITHOUT EXACERBATION (ICD-358.00) his myasthenia is worsening. He does not have any cholinergic symptoms to suggest Mestinon overdosing. I have called Dr. Anne Hahn. They will arrange earlier neurological followup that had been previously scheduled.  Problem # 2:  CHEST PAIN-UNSPECIFIED/ RESOLVED (ICD-786.50) stable; calcium blockers have been discontinued because of the above;  catheterization was normal at 2006 His updated medication list for this problem includes:    Benazepril Hcl 40 Mg Tabs (Benazepril hcl) .Marland Kitchen... Take 1 tablet by mouth once a day for blood pressure    Cardizem Cd 120 Mg Xr24h-cap (Diltiazem hcl coated beads) ..... Once daily  Problem # 3:  ORTHOSTATIC HYPOTENSION (ICD-458.0) stable  Other Orders: EKG w/ Interpretation (93000)  Patient Instructions: 1)  Your physician recommends that you continue on your current medications as directed. Please refer to the Current Medication list given to you today. 2)  Your physician wants you to follow-up in: 6 months  You will receive a reminder letter in the mail two months in advance. If you don't receive a letter, please call our office to schedule the follow-up appointment.

## 2010-07-21 ENCOUNTER — Other Ambulatory Visit: Payer: Self-pay | Admitting: *Deleted

## 2010-07-21 MED ORDER — ZOLPIDEM TARTRATE 10 MG PO TABS: 10.0000 mg | ORAL_TABLET | Freq: Every evening | ORAL | Status: DC | PRN

## 2010-07-21 NOTE — Telephone Encounter (Signed)
Rx called in 

## 2010-08-01 ENCOUNTER — Telehealth: Payer: Self-pay | Admitting: *Deleted

## 2010-08-01 NOTE — Telephone Encounter (Signed)
Will need an appointment to discuss his concerns and follow up with me.

## 2010-08-01 NOTE — Telephone Encounter (Signed)
Since I am not [name]' PCP, I will defer this question to his PCP. [name] has not yet seen [name], so an appt might need to be made to address these questions.

## 2010-08-01 NOTE — Telephone Encounter (Signed)
Call from pt would like to talk with Dr. Rogelia Boga.  Has some concerns.  Would like to get a referral to [facility].  Wants to find out what levels of toxins are in his blood stream.  Heavy metals for instance.  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   Said that taking Manganese would help.  Wanted to know the types of labs that can be done here so that he could begin testing.  Said that he really trusts Dr. Rogelia Boga.  Appointment can be scheduled at anytime.

## 2010-08-04 ENCOUNTER — Telehealth: Payer: Self-pay | Admitting: *Deleted

## 2010-08-04 NOTE — Telephone Encounter (Signed)
Pt called and wants to have blood work done for toxins/heavy metals and food allergies. He wants results of scans of head and chest.  He needs help on getting referral to  MD at [facility]. He is asking to talk with Dr Rogelia Boga about changing his PCP.  I explained that Lowell Bouton, director can only change doctor. He still is asking to talk with Dr Rogelia Boga.   Please call pt at [phone]

## 2010-08-05 NOTE — Telephone Encounter (Signed)
I will not be calling Mr Wailes. Mr Basnett has a PCP (Dr Tonny Branch) and he needs to communicate with him. Having multiple doctors offering different opinions will just muddy up the situation. Mr Preis needs to make an appt with Dr Tonny Branch to discuss his concerns and requests. Sorry!

## 2010-08-05 NOTE — Telephone Encounter (Signed)
Pt called and message left for him to return call

## 2010-08-05 NOTE — Telephone Encounter (Signed)
Talked to pt and informed of Dr Robyn Haber note.

## 2010-08-07 ENCOUNTER — Encounter (INDEPENDENT_AMBULATORY_CARE_PROVIDER_SITE_OTHER): Payer: Medicare Other | Admitting: Cardiothoracic Surgery

## 2010-08-07 DIAGNOSIS — G7000 Myasthenia gravis without (acute) exacerbation: Secondary | ICD-10-CM

## 2010-08-08 NOTE — Assessment & Plan Note (Signed)
OFFICE VISIT  ROCHELL, MABIE DOB:  May 28, 1946                                        August 08, 2010 CHART #:  [id number]  Mr. Kleinpeter returns to the office today in followup from his original new patient consultation done on June 20, 2010.  At that time I have seen the patient at the request of Dr. Pearlean Brownie, because of the new diagnosis of myasthenia gravis and with significantly elevated acetylcholine binding antibody elevated to 26.8, he did not have a thymoma but because of symptoms and very high elevated antibody, Dr. Pearlean Brownie, had recommended to him proceeding with thymectomy.  I had seen the patient and discussed the procedure with him but at that time he wished to proceed with homeopathic treatments including manganese.  He now returns about a month later with further questions about surgery, the process, and is now admitting that his current therapy is not working though he has had some improvement with, but notes that he cannot keep one likely he is going.  He still has some difficulty chewing, continues on prednisone 20 mg p.o. twice a day.  He is also on Pyridostigmine 60 mg up to five times a day.  On exam, the patient's blood pressure 150/95, heart rate is 120, respiratory rate 16, and O2 sats 96%.  He still has tape on his eyelids which he says it helps when he is reading his computer screen.  His lungs are clear.  He has no hemoptysis.  He has no respiratory distress. Abdominal exam is benign without palpable masses or tenderness.  He has no lower extremity edema.  As noted above, I further discussed with the patient, partial upper sternotomy with thymectomy and I again reviewed with him that his imaging studies do not demonstrate a thymoma but that we could expect with thymectomy been able to control symptoms with much lower medication doses.  I have again discussed the case with Dr. Pearlean Brownie and if the patient is willing to proceed, we will admit  him and place a dialysis catheter.  The neurologist will proceed with plasmapheresis for 3-4 days prior to surgery and then proceed with  surgery during the same admission.  At the completion of the discussion with the patient, he notes that in 3-4 more days he will think over the situation and call the office back and let us know if he would like to proceed.  We will then coordinate this with Dr. Marlis Edelson office.  Sheliah Plane, MD Electronically Signed  EG/MEDQ  D:  08/08/2010  T:  08/08/2010  Job:  [id number]  cc:   Pramod P. Pearlean Brownie, MD Duke Salvia, MD, [facility]

## 2010-08-12 ENCOUNTER — Telehealth: Payer: Self-pay | Admitting: *Deleted

## 2010-08-12 NOTE — Telephone Encounter (Signed)
[name] called me today and I confirmed Dr. Tonny Branch is his doctor and can take exceptional care of him.  He agreed.  The rest is my understanding of the conversation:  Wanted tests for heavy metals, food poisoning, food allergies, and manganese because they effected his diagnosis of myasthenia gravis which also has an impact on the removal of his thymus (unscheduled at this time) which Dr. Tyrone Sage can't remove until he gets the results of these tests?  He just called back again.  Wants to see if another doctor would order the heavy metals and manganese.  I suggested he see Dr. Tonny Branch as scheduled. [name] Dir [facility]

## 2010-08-13 ENCOUNTER — Other Ambulatory Visit: Payer: Medicare Other

## 2010-08-17 ENCOUNTER — Emergency Department (HOSPITAL_COMMUNITY): Payer: Medicare Other

## 2010-08-17 ENCOUNTER — Emergency Department (HOSPITAL_COMMUNITY)
Admission: EM | Admit: 2010-08-17 | Discharge: 2010-08-18 | Discharge status: Against Medical Advice | Payer: Medicare Other | Attending: Emergency Medicine | Admitting: Emergency Medicine

## 2010-08-17 DIAGNOSIS — Y92009 Unspecified place in unspecified non-institutional (private) residence as the place of occurrence of the external cause: Secondary | ICD-10-CM | POA: Insufficient documentation

## 2010-08-17 DIAGNOSIS — Z91199 Patient's noncompliance with other medical treatment and regimen due to unspecified reason: Secondary | ICD-10-CM | POA: Insufficient documentation

## 2010-08-17 DIAGNOSIS — E119 Type 2 diabetes mellitus without complications: Secondary | ICD-10-CM | POA: Insufficient documentation

## 2010-08-17 DIAGNOSIS — R109 Unspecified abdominal pain: Secondary | ICD-10-CM | POA: Insufficient documentation

## 2010-08-17 DIAGNOSIS — R079 Chest pain, unspecified: Secondary | ICD-10-CM | POA: Insufficient documentation

## 2010-08-17 DIAGNOSIS — W010XXA Fall on same level from slipping, tripping and stumbling without subsequent striking against object, initial encounter: Secondary | ICD-10-CM | POA: Insufficient documentation

## 2010-08-17 DIAGNOSIS — Z9119 Patient's noncompliance with other medical treatment and regimen: Secondary | ICD-10-CM | POA: Insufficient documentation

## 2010-08-17 DIAGNOSIS — S20219A Contusion of unspecified front wall of thorax, initial encounter: Secondary | ICD-10-CM | POA: Insufficient documentation

## 2010-08-17 DIAGNOSIS — G7000 Myasthenia gravis without (acute) exacerbation: Secondary | ICD-10-CM | POA: Insufficient documentation

## 2010-08-17 DIAGNOSIS — I10 Essential (primary) hypertension: Secondary | ICD-10-CM | POA: Insufficient documentation

## 2010-08-17 LAB — BLOOD GAS, VENOUS
Acid-Base Excess: 1.2 mmol/L (ref 0.0–2.0)
Bicarbonate: 24.9 mEq/L — ABNORMAL HIGH (ref 20.0–24.0)
FIO2: 0.21 %
O2 Saturation: 76.8 %
Patient temperature: 98.6
TCO2: 21.5 mmol/L (ref 0–100)
pCO2, Ven: 38.4 mmHg — ABNORMAL LOW (ref 45.0–50.0)
pH, Ven: 7.428 — ABNORMAL HIGH (ref 7.250–7.300)
pO2, Ven: 40.2 mmHg (ref 30.0–45.0)

## 2010-08-17 LAB — DIFFERENTIAL
Lymphs Abs: 1 10*3/uL (ref 0.7–4.0)
Monocytes Absolute: 0.7 10*3/uL (ref 0.1–1.0)
Monocytes Relative: 6 % (ref 3–12)
Neutro Abs: 9.4 10*3/uL — ABNORMAL HIGH (ref 1.7–7.7)
Neutrophils Relative %: 85 % — ABNORMAL HIGH (ref 43–77)

## 2010-08-17 LAB — URINALYSIS, ROUTINE W REFLEX MICROSCOPIC
Bilirubin Urine: NEGATIVE
Glucose, UA: >1000 mg/dL — AB
Hgb urine dipstick: NEGATIVE
Ketones, ur: NEGATIVE mg/dL
Protein, ur: NEGATIVE mg/dL

## 2010-08-17 LAB — BASIC METABOLIC PANEL
BUN: 24 mg/dL — ABNORMAL HIGH (ref 6–23)
CO2: 24 mEq/L (ref 19–32)
Chloride: 89 mEq/L — ABNORMAL LOW (ref 96–112)
Creatinine, Ser: 1.53 mg/dL — ABNORMAL HIGH (ref 0.4–1.5)
Glucose, Bld: 801 mg/dL (ref 70–99)

## 2010-08-17 LAB — CBC
HCT: 43.7 % (ref 39.0–52.0)
Hemoglobin: 15.2 g/dL (ref 13.0–17.0)
MCH: 29.6 pg (ref 26.0–34.0)
MCV: 85.2 fL (ref 78.0–100.0)
RBC: 5.13 MIL/uL (ref 4.22–5.81)

## 2010-08-17 LAB — SAMPLE TO BLOOD BANK

## 2010-08-17 LAB — URINE MICROSCOPIC-ADD ON

## 2010-08-18 LAB — CBC
HCT: 42.9 % (ref 39.0–52.0)
HCT: 44.6 % (ref 39.0–52.0)
HCT: 45.3 % (ref 39.0–52.0)
Hemoglobin: 15.6 g/dL (ref 13.0–17.0)
Hemoglobin: 16.1 g/dL (ref 13.0–17.0)
MCH: 29.9 pg (ref 26.0–34.0)
MCH: 29.9 pg (ref 26.0–34.0)
MCHC: 36.1 g/dL — ABNORMAL HIGH (ref 30.0–36.0)
MCHC: 36.4 g/dL — ABNORMAL HIGH (ref 30.0–36.0)
MCHC: 36.6 g/dL — ABNORMAL HIGH (ref 30.0–36.0)
MCV: 81.9 fL (ref 78.0–100.0)
MCV: 82.2 fL (ref 78.0–100.0)
MCV: 82.9 fL (ref 78.0–100.0)
Platelets: 176 K/uL (ref 150–400)
Platelets: 182 K/uL (ref 150–400)
Platelets: 205 10*3/uL (ref 150–400)
RBC: 5.22 MIL/uL (ref 4.22–5.81)
RBC: 5.38 MIL/uL (ref 4.22–5.81)
RDW: 12.4 % (ref 11.5–15.5)
RDW: 12.4 % (ref 11.5–15.5)
RDW: 12.5 % (ref 11.5–15.5)
WBC: 6.8 K/uL (ref 4.0–10.5)
WBC: 7.4 K/uL (ref 4.0–10.5)
WBC: 7.9 10*3/uL (ref 4.0–10.5)

## 2010-08-18 LAB — BASIC METABOLIC PANEL WITH GFR
BUN: 12 mg/dL (ref 6–23)
CO2: 24 meq/L (ref 19–32)
Calcium: 9.5 mg/dL (ref 8.4–10.5)
Chloride: 107 meq/L (ref 96–112)
Creatinine, Ser: 0.9 mg/dL (ref 0.4–1.5)
GFR calc non Af Amer: >60 mL/min
Glucose, Bld: 264 mg/dL — ABNORMAL HIGH (ref 70–99)
Potassium: 4.3 meq/L (ref 3.5–5.1)
Sodium: 138 meq/L (ref 135–145)

## 2010-08-18 LAB — BASIC METABOLIC PANEL
Calcium: 10.1 mg/dL (ref 8.4–10.5)
Calcium: 9.4 mg/dL (ref 8.4–10.5)
Chloride: 101 mEq/L (ref 96–112)
Creatinine, Ser: 1.01 mg/dL (ref 0.4–1.5)
GFR calc Af Amer: >60 mL/min (ref >60)
GFR calc Af Amer: >60 mL/min (ref >60)
GFR calc non Af Amer: >60 mL/min (ref >60)
Sodium: 137 mEq/L (ref 135–145)

## 2010-08-18 LAB — DIFFERENTIAL
Basophils Relative: 0 % (ref 0–1)
Lymphocytes Relative: 24 % (ref 12–46)
Lymphs Abs: 1.6 10*3/uL (ref 0.7–4.0)
Lymphs Abs: 1.9 10*3/uL (ref 0.7–4.0)
Monocytes Relative: 10 % (ref 3–12)
Neutro Abs: 4.5 10*3/uL (ref 1.7–7.7)
Neutrophils Relative %: 60 % (ref 43–77)
Neutrophils Relative %: 61 % (ref 43–77)

## 2010-08-18 LAB — PROTIME-INR
INR: 0.88 (ref 0.00–1.49)
Prothrombin Time: 12.1 seconds (ref 11.6–15.2)

## 2010-08-18 LAB — ACETYLCHOLINE RECEPTOR, BINDING: Acetylcholine Receptor Ab: 26.8 nmol/L — ABNORMAL HIGH

## 2010-08-18 LAB — GLUCOSE, CAPILLARY
Glucose-Capillary: 307 mg/dL — ABNORMAL HIGH (ref 70–99)
Glucose-Capillary: 339 mg/dL — ABNORMAL HIGH (ref 70–99)

## 2010-08-18 LAB — APTT: aPTT: 26 s (ref 24–37)

## 2010-08-19 LAB — GLUCOSE, CAPILLARY: Glucose-Capillary: 338 mg/dL — ABNORMAL HIGH (ref 70–99)

## 2010-08-22 ENCOUNTER — Telehealth: Payer: Self-pay | Admitting: *Deleted

## 2010-08-22 NOTE — Telephone Encounter (Signed)
Pt informed Dr Tonny Branch will look into getting labs ordered and will talk with him at next visit.

## 2010-08-22 NOTE — Telephone Encounter (Signed)
Pt called and asked for blood work to be done for manganese which effects myasthenia gravis  Do you want to order this?  He can have it done here or I can call it in to place of pt request.

## 2010-08-23 LAB — GLUCOSE, CAPILLARY: Glucose-Capillary: 320 mg/dL — ABNORMAL HIGH (ref 70–99)

## 2010-08-23 NOTE — Telephone Encounter (Signed)
Will research utility of manganese level in relation to the patient's medical problems and discuss at follow up appointment in April.

## 2010-08-24 LAB — GLUCOSE, CAPILLARY: Glucose-Capillary: 455 mg/dL — ABNORMAL HIGH (ref 70–99)

## 2010-08-26 ENCOUNTER — Telehealth: Payer: Self-pay | Admitting: *Deleted

## 2010-08-26 NOTE — Telephone Encounter (Signed)
Pt called and is wanting a Rx for custom made vitamins. He is taking a liquid mineral substitute and vitamin pill. [facility] is having [facility] a vitamin without the magnesium but he needs an order. Phone # [phone]  For pharmacy   Fax # [phone]  "Liquid compounded Vitamin Pills"

## 2010-08-27 NOTE — Telephone Encounter (Signed)
  I talked to Ed in the pharmacy and he will make a vitamin/mineral liquid for pt.  He needs a physician order to do this. The Rx should read  " make compound:  multi vitamin & mineral liquid or capsule "  "no calcium or magnesium " Please call pharmacist, Ed at [phone] if you have any questions.   The bottom line is Emari wants vitamins without calcium and magnesium and can't find them, so will have them made.

## 2010-08-29 NOTE — Telephone Encounter (Signed)
Patient has an appointment with me on 09/26/10.  Will discuss this along with all his other concerns at that appointment.  Low magnesium can contribute to muscular weakness and Calcium is an essential mineral for muscle and bone function so we need to discuss the rational for removing these essential nutrients from his diet before doing it.

## 2010-09-03 ENCOUNTER — Telehealth: Payer: Self-pay | Admitting: *Deleted

## 2010-09-03 ENCOUNTER — Other Ambulatory Visit: Payer: Medicare Other

## 2010-09-03 DIAGNOSIS — G7000 Myasthenia gravis without (acute) exacerbation: Secondary | ICD-10-CM

## 2010-09-03 NOTE — Telephone Encounter (Signed)
Manganese is used as a homeopathic remedy for MG but the surgery would be his thymectomy and pyridostigmine would be the standard medical therapy for this for him.  I will check it but I think he should continue to have his surgery because the there is a possibility that the removal of this thymus can significantly help his MG.

## 2010-09-03 NOTE — Telephone Encounter (Signed)
Pt had all request done by another doctor. He only would like a  Manganese level drawn here. He wants it ASAP because if it's low he will not have  Surgery done next week. Will you order a  Manganese level?? Pt # [phone]

## 2010-09-03 NOTE — Telephone Encounter (Signed)
Pt informed

## 2010-09-07 HISTORY — PX: THYMECTOMY: SHX1063

## 2010-09-11 LAB — MANGANESE

## 2010-09-15 ENCOUNTER — Telehealth: Payer: Self-pay | Admitting: *Deleted

## 2010-09-15 NOTE — Telephone Encounter (Signed)
Pt calls and leaves message that he will be going to lab this day and getting results and bringing them to the clinic, he states he has waited long enough, as i was reading results which came to clinic late 4/6, traci (lab) came in with printed results, i paged dr Tonny Branch and he states to inform pt of results and that he still suggest surgery.

## 2010-09-18 NOTE — Telephone Encounter (Signed)
Relayed message from dr Tonny Branch, [name] states he is scheduled for surgery soon, he just does not have a date yet, i see he is coming in soon for preop eval

## 2010-09-22 NOTE — Telephone Encounter (Signed)
Addended by: Alric Quan on: 09/22/2010 02:15 PM   Modules accepted: Orders

## 2010-09-24 ENCOUNTER — Other Ambulatory Visit: Payer: Self-pay | Admitting: *Deleted

## 2010-09-24 MED ORDER — GLIPIZIDE 10 MG PO TABS: 10.0000 mg | ORAL_TABLET | Freq: Two times a day (BID) | ORAL | Status: DC

## 2010-09-25 ENCOUNTER — Encounter (HOSPITAL_COMMUNITY)
Admission: RE | Admit: 2010-09-25 | Discharge: 2010-09-25 | Disposition: A | Discharge status: Home / Self Care | Payer: Medicare Other | Source: Ambulatory Visit | Attending: Cardiothoracic Surgery | Admitting: Cardiothoracic Surgery

## 2010-09-25 LAB — COMPREHENSIVE METABOLIC PANEL
ALT: 24 U/L (ref 0–53)
AST: 15 U/L (ref 0–37)
Albumin: 3 g/dL — ABNORMAL LOW (ref 3.5–5.2)
Alkaline Phosphatase: 106 U/L (ref 39–117)
BUN: 16 mg/dL (ref 6–23)
CO2: 31 mEq/L (ref 19–32)
Calcium: 9.7 mg/dL (ref 8.4–10.5)
Chloride: 99 mEq/L (ref 96–112)
Creatinine, Ser: 0.85 mg/dL (ref 0.4–1.5)
GFR calc Af Amer: >60 mL/min (ref >60)
GFR calc non Af Amer: >60 mL/min (ref >60)
Glucose, Bld: 461 mg/dL — ABNORMAL HIGH (ref 70–99)
Potassium: 4.9 mEq/L (ref 3.5–5.1)
Sodium: 136 mEq/L (ref 135–145)
Total Bilirubin: 1 mg/dL (ref 0.3–1.2)
Total Protein: 5.5 g/dL — ABNORMAL LOW (ref 6.0–8.3)

## 2010-09-25 LAB — SURGICAL PCR SCREEN
MRSA, PCR: NEGATIVE
Staphylococcus aureus: POSITIVE — AB

## 2010-09-25 LAB — CBC
HCT: 44.4 % (ref 39.0–52.0)
Hemoglobin: 15.6 g/dL (ref 13.0–17.0)
MCH: 30 pg (ref 26.0–34.0)
MCHC: 35.1 g/dL (ref 30.0–36.0)
MCV: 85.4 fL (ref 78.0–100.0)
Platelets: 205 10*3/uL (ref 150–400)
RBC: 5.2 MIL/uL (ref 4.22–5.81)
RDW: 14.3 % (ref 11.5–15.5)
WBC: 10.6 10*3/uL — ABNORMAL HIGH (ref 4.0–10.5)

## 2010-09-26 ENCOUNTER — Encounter: Payer: Self-pay | Admitting: Internal Medicine

## 2010-09-29 ENCOUNTER — Other Ambulatory Visit: Payer: Self-pay | Admitting: Cardiothoracic Surgery

## 2010-09-29 ENCOUNTER — Ambulatory Visit (HOSPITAL_COMMUNITY)
Admission: RE | Admit: 2010-09-29 | Discharge: 2010-09-29 | Disposition: A | Discharge status: Home / Self Care | Payer: Medicare Other | Source: Ambulatory Visit | Attending: Cardiothoracic Surgery | Admitting: Cardiothoracic Surgery

## 2010-09-29 ENCOUNTER — Inpatient Hospital Stay (HOSPITAL_COMMUNITY): Payer: Medicare Other

## 2010-09-29 ENCOUNTER — Inpatient Hospital Stay (HOSPITAL_COMMUNITY)
Admission: RE | Admit: 2010-09-29 | Discharge: 2010-10-06 | DRG: 042 | Disposition: A | Discharge status: Home Health | Payer: Medicare Other | Source: Ambulatory Visit | Attending: Cardiothoracic Surgery | Admitting: Cardiothoracic Surgery

## 2010-09-29 DIAGNOSIS — IMO0002 Reserved for concepts with insufficient information to code with codable children: Secondary | ICD-10-CM

## 2010-09-29 DIAGNOSIS — R000 Tachycardia, unspecified: Secondary | ICD-10-CM | POA: Diagnosis not present

## 2010-09-29 DIAGNOSIS — E785 Hyperlipidemia, unspecified: Secondary | ICD-10-CM | POA: Diagnosis present

## 2010-09-29 DIAGNOSIS — G7000 Myasthenia gravis without (acute) exacerbation: Secondary | ICD-10-CM

## 2010-09-29 DIAGNOSIS — E119 Type 2 diabetes mellitus without complications: Secondary | ICD-10-CM | POA: Diagnosis present

## 2010-09-29 DIAGNOSIS — Z91041 Radiographic dye allergy status: Secondary | ICD-10-CM

## 2010-09-29 DIAGNOSIS — I10 Essential (primary) hypertension: Secondary | ICD-10-CM | POA: Diagnosis present

## 2010-09-29 LAB — ABO/RH: ABO/RH(D): O POS

## 2010-09-29 LAB — COMPREHENSIVE METABOLIC PANEL
AST: 18 U/L (ref 0–37)
Albumin: 2.5 g/dL — ABNORMAL LOW (ref 3.5–5.2)
Alkaline Phosphatase: 58 U/L (ref 39–117)
CO2: 27 mEq/L (ref 19–32)
Chloride: 103 mEq/L (ref 96–112)
GFR calc Af Amer: >60 mL/min (ref >60)
GFR calc non Af Amer: >60 mL/min (ref >60)
Potassium: 3.6 mEq/L (ref 3.5–5.1)
Total Bilirubin: 0.8 mg/dL (ref 0.3–1.2)

## 2010-09-29 LAB — PROTIME-INR: INR: 0.86 (ref 0.00–1.49)

## 2010-09-29 LAB — HEPATITIS B SURFACE ANTIGEN: Hepatitis B Surface Ag: NEGATIVE

## 2010-09-29 LAB — CBC
HCT: 37.3 % — ABNORMAL LOW (ref 39.0–52.0)
MCH: 30.2 pg (ref 26.0–34.0)
MCV: 85.4 fL (ref 78.0–100.0)
RDW: 14.2 % (ref 11.5–15.5)
WBC: 11.6 10*3/uL — ABNORMAL HIGH (ref 4.0–10.5)

## 2010-09-29 LAB — APTT: aPTT: 24 seconds (ref 24–37)

## 2010-09-29 LAB — GLUCOSE, CAPILLARY
Glucose-Capillary: 446 mg/dL — ABNORMAL HIGH (ref 70–99)
Glucose-Capillary: 325 mg/dL — ABNORMAL HIGH (ref 70–99)

## 2010-09-29 LAB — RETICULOCYTES: Retic Count, Absolute: 87.4 10*3/uL (ref 19.0–186.0)

## 2010-09-29 LAB — HEPATITIS C ANTIBODY: HCV Ab: NEGATIVE

## 2010-09-30 DIAGNOSIS — IMO0001 Reserved for inherently not codable concepts without codable children: Secondary | ICD-10-CM

## 2010-09-30 DIAGNOSIS — E1165 Type 2 diabetes mellitus with hyperglycemia: Secondary | ICD-10-CM

## 2010-09-30 LAB — BASIC METABOLIC PANEL
BUN: 10 mg/dL (ref 6–23)
CO2: 26 mEq/L (ref 19–32)
Calcium: 8.4 mg/dL (ref 8.4–10.5)
Chloride: 101 mEq/L (ref 96–112)
Creatinine, Ser: 0.87 mg/dL (ref 0.4–1.5)
GFR calc Af Amer: >60 mL/min (ref >60)
GFR calc non Af Amer: >60 mL/min (ref >60)
Glucose, Bld: 393 mg/dL — ABNORMAL HIGH (ref 70–99)
Potassium: 4 mEq/L (ref 3.5–5.1)
Sodium: 134 mEq/L — ABNORMAL LOW (ref 135–145)

## 2010-09-30 LAB — CBC
HCT: 39.6 % (ref 39.0–52.0)
Hemoglobin: 14.1 g/dL (ref 13.0–17.0)
MCH: 30.2 pg (ref 26.0–34.0)
MCHC: 35.6 g/dL (ref 30.0–36.0)
MCV: 84.8 fL (ref 78.0–100.0)
Platelets: 167 10*3/uL (ref 150–400)
RBC: 4.67 MIL/uL (ref 4.22–5.81)
RDW: 14.2 % (ref 11.5–15.5)
WBC: 10.5 10*3/uL (ref 4.0–10.5)

## 2010-09-30 LAB — GLUCOSE, CAPILLARY
Glucose-Capillary: 247 mg/dL — ABNORMAL HIGH (ref 70–99)
Glucose-Capillary: 299 mg/dL — ABNORMAL HIGH (ref 70–99)
Glucose-Capillary: 406 mg/dL — ABNORMAL HIGH (ref 70–99)

## 2010-10-01 ENCOUNTER — Inpatient Hospital Stay (HOSPITAL_COMMUNITY): Payer: Medicare Other

## 2010-10-01 DIAGNOSIS — E1165 Type 2 diabetes mellitus with hyperglycemia: Secondary | ICD-10-CM

## 2010-10-01 DIAGNOSIS — IMO0001 Reserved for inherently not codable concepts without codable children: Secondary | ICD-10-CM

## 2010-10-01 LAB — GLUCOSE, CAPILLARY
Glucose-Capillary: 162 mg/dL — ABNORMAL HIGH (ref 70–99)
Glucose-Capillary: 201 mg/dL — ABNORMAL HIGH (ref 70–99)
Glucose-Capillary: 288 mg/dL — ABNORMAL HIGH (ref 70–99)
Glucose-Capillary: 363 mg/dL — ABNORMAL HIGH (ref 70–99)
Glucose-Capillary: 415 mg/dL — ABNORMAL HIGH (ref 70–99)

## 2010-10-01 LAB — BASIC METABOLIC PANEL
BUN: 10 mg/dL (ref 6–23)
CO2: 26 mEq/L (ref 19–32)
Calcium: 8.5 mg/dL (ref 8.4–10.5)
Chloride: 109 mEq/L (ref 96–112)
Creatinine, Ser: 0.73 mg/dL (ref 0.4–1.5)
GFR calc Af Amer: >60 mL/min (ref >60)
GFR calc non Af Amer: >60 mL/min (ref >60)
Glucose, Bld: 238 mg/dL — ABNORMAL HIGH (ref 70–99)
Potassium: 4 mEq/L (ref 3.5–5.1)
Sodium: 141 mEq/L (ref 135–145)

## 2010-10-01 LAB — CBC
HCT: 36.4 % — ABNORMAL LOW (ref 39.0–52.0)
Hemoglobin: 12.5 g/dL — ABNORMAL LOW (ref 13.0–17.0)
MCH: 29.2 pg (ref 26.0–34.0)
MCHC: 34.3 g/dL (ref 30.0–36.0)
MCV: 85 fL (ref 78.0–100.0)
Platelets: 141 10*3/uL — ABNORMAL LOW (ref 150–400)
RBC: 4.28 MIL/uL (ref 4.22–5.81)
RDW: 14.3 % (ref 11.5–15.5)
WBC: 9.4 10*3/uL (ref 4.0–10.5)

## 2010-10-02 DIAGNOSIS — Z01810 Encounter for preprocedural cardiovascular examination: Secondary | ICD-10-CM

## 2010-10-02 DIAGNOSIS — I517 Cardiomegaly: Secondary | ICD-10-CM

## 2010-10-02 LAB — CBC
HCT: 39 % (ref 39.0–52.0)
Hemoglobin: 13.6 g/dL (ref 13.0–17.0)
MCH: 30.3 pg (ref 26.0–34.0)
MCHC: 34.9 g/dL (ref 30.0–36.0)
MCV: 86.9 fL (ref 78.0–100.0)
Platelets: 161 10*3/uL (ref 150–400)
RBC: 4.49 MIL/uL (ref 4.22–5.81)
RDW: 14.7 % (ref 11.5–15.5)
WBC: 10.3 10*3/uL (ref 4.0–10.5)

## 2010-10-02 LAB — TYPE AND SCREEN
ABO/RH(D): O POS
Antibody Screen: NEGATIVE

## 2010-10-02 LAB — BASIC METABOLIC PANEL
BUN: 7 mg/dL (ref 6–23)
CO2: 27 mEq/L (ref 19–32)
Calcium: 9.6 mg/dL (ref 8.4–10.5)
Chloride: 106 mEq/L (ref 96–112)
Creatinine, Ser: 0.66 mg/dL (ref 0.4–1.5)
GFR calc Af Amer: >60 mL/min (ref >60)
GFR calc non Af Amer: >60 mL/min (ref >60)
Glucose, Bld: 256 mg/dL — ABNORMAL HIGH (ref 70–99)
Potassium: 4.2 mEq/L (ref 3.5–5.1)
Sodium: 144 mEq/L (ref 135–145)

## 2010-10-02 LAB — GLUCOSE, CAPILLARY
Glucose-Capillary: 209 mg/dL — ABNORMAL HIGH (ref 70–99)
Glucose-Capillary: 420 mg/dL — ABNORMAL HIGH (ref 70–99)
Glucose-Capillary: 112 mg/dL — ABNORMAL HIGH (ref 70–99)
Glucose-Capillary: 386 mg/dL — ABNORMAL HIGH (ref 70–99)

## 2010-10-03 ENCOUNTER — Other Ambulatory Visit: Payer: Self-pay | Admitting: Cardiothoracic Surgery

## 2010-10-03 ENCOUNTER — Inpatient Hospital Stay (HOSPITAL_COMMUNITY): Payer: Medicare Other

## 2010-10-03 DIAGNOSIS — G7000 Myasthenia gravis without (acute) exacerbation: Secondary | ICD-10-CM

## 2010-10-03 LAB — GLUCOSE, CAPILLARY
Glucose-Capillary: 375 mg/dL — ABNORMAL HIGH (ref 70–99)
Glucose-Capillary: 200 mg/dL — ABNORMAL HIGH (ref 70–99)
Glucose-Capillary: 182 mg/dL — ABNORMAL HIGH (ref 70–99)
Glucose-Capillary: 362 mg/dL — ABNORMAL HIGH (ref 70–99)
Glucose-Capillary: 170 mg/dL — ABNORMAL HIGH (ref 70–99)
Glucose-Capillary: 129 mg/dL — ABNORMAL HIGH (ref 70–99)
Glucose-Capillary: 131 mg/dL — ABNORMAL HIGH (ref 70–99)
Glucose-Capillary: 138 mg/dL — ABNORMAL HIGH (ref 70–99)
Glucose-Capillary: 146 mg/dL — ABNORMAL HIGH (ref 70–99)
Glucose-Capillary: 149 mg/dL — ABNORMAL HIGH (ref 70–99)
Glucose-Capillary: 150 mg/dL — ABNORMAL HIGH (ref 70–99)
Glucose-Capillary: 140 mg/dL — ABNORMAL HIGH (ref 70–99)
Glucose-Capillary: 154 mg/dL — ABNORMAL HIGH (ref 70–99)
Glucose-Capillary: 140 mg/dL — ABNORMAL HIGH (ref 70–99)
Glucose-Capillary: 127 mg/dL — ABNORMAL HIGH (ref 70–99)

## 2010-10-03 NOTE — Op Note (Addendum)
  NAME:  [name], TESTA NO.:  [id number]  MEDICAL RECORD NO.:  [id number]           PATIENT TYPE:  O  LOCATION:  XRAY                         FACILITY:  [facility]  PHYSICIAN:  Sheliah Plane, MD    DATE OF BIRTH:  09-Feb-1946  DATE OF PROCEDURE:  09/29/2010 DATE OF DISCHARGE:                              OPERATIVE REPORT   PREOPERATIVE DIAGNOSIS:  Myasthenia gravis.  POSTOPERATIVE DIAGNOSIS:  Myasthenia gravis with need for plasmapheresis.  PROCEDURE PERFORMED:  Left subclavian vein Diatek catheter placement for plasmapheresis.  SURGEON:  Sheliah Plane, MD  BRIEF HISTORY:  The patient is a 65 year old male who has been followed by Dr. Pearlean Brownie for myasthenia.  Imaging did not reveal any thymoma, but with the patient's symptoms high levels of antibodies, consideration for thymectomy was discussed with the patient and recommended.  The patient has wanted to wait on this for a while, but continues with needing significant levels of medication to control symptoms, and in consultation with Dr. Pearlean Brownie was decided to place a dialysis catheter proceed with plasmapheresis for 3-4 days prior to partial sternotomy and thymectomy.  The patient's after numerous consultations in office, is agreeable with proceeding.  DESCRIPTION OF PROCEDURE:  With light intravenous sedation, the skin of the neck was prepped with Betadine and draped in usual sterile manner. Ultrasound was used to image the right subclavian vein.  A 16-gauge needle was introduced into the vein.  However, after several attempts we were unable to satisfactorily pass a guide wire into the superior vena cava from the right.  The vein was easily cannulated, but guidewire were not seen.  We abandoned this and moved to the left side and 1% lidocaine was infiltrated.  A 16-gauge needle was introduced into the left subclavian vein without difficulty.  A guidewire was positioned in the superior vena cava under  fluoroscopic guidance.  Small counter incision to pass the tunnel of the dialysis catheter was done.  Serial dilatation over the guidewire was carried out and then the Diatek catheter was positioned through the peel-away sheath.  The peel-away sheath was removed.  The dialysis catheter had been flushed with heparinized saline, was trimmed to the appropriate length, and the connector was placed.  With the catheter in place, this was confirmed fluoroscopically.  There were no evidence of pneumo.  This catheter was secured.  The appropriate levels of concentrated 100 units/mL heparin were left in each of the venous and arterial ports of the Diatek.  The small incision was closed with interrupted 4-0 Vicryl.  Dermabond was applied.  Dressing was applied.  The patient was transferred to recovery room, having tolerated the procedure without obvious complication with minimal blood loss.  Sponge and needle count was reported as correct.  At the start of the procedure, appropriate time-out had been performed and recorded.     Sheliah Plane, MD    EG/MEDQ  D:  09/29/2010  T:  09/29/2010  Job:  [id number]  cc:   Pramod P. Pearlean Brownie, MD  Electronically Signed by Sheliah Plane MD on 10/13/2010 12:24:45 PM

## 2010-10-03 NOTE — Consult Note (Signed)
NAME:  [name], [name] NO.:  [id number]  MEDICAL RECORD NO.:  [id number]           PATIENT TYPE:  O  LOCATION:  XRAY                         FACILITY:  [facility]  PHYSICIAN:  Levie Heritage, MD       DATE OF BIRTH:  08-12-1945  DATE OF CONSULTATION:  09/29/2010 DATE OF DISCHARGE:                                CONSULTATION   REASON FOR CONSULTATION:  Consultation for the patient who was about to obtain plasmapheresis prior to thymectomy.  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  The patient sees Dr. Pearlean Brownie as an outpatient to which he has been receiving his prednisone and Mestinon.  Per office note that was dated on August 27, 2010, the patient had noted a lot of improvement in his swallowing secondary to prednisone and at that time did not need to hold his jaw shut and was able to chew food.  The patient in addition no longer needed to tape his eyelids open and his ptosis has improved.  The patient had seen Dr. Tyrone Sage and decided to undergo a thymectomy.  Plan to do this are on October 03, 2010.  We were asked to see the patient prior to his plasma exchanges.  PAST MEDICAL HISTORY: 1. Hypertension. 2. Diabetes. 3. Depression. 4. Spinal degenerative disk disease. 5. Oculobulbar myasthenia gravis diagnosed in 2011.  MEDICATIONS:  The patient's medications include Mestinon 60 mg tabs one and half tablets 5 times daily, benazepril, hydrochlorothiazide, metformin, glipizide, diltiazem, Advil, magnesium, vitamin D and prednisone 20 mg tabs 2 tablets daily.  ALLERGIES:  The patient is allergic to CALCIUM, MAGNESIUM CONTRAST MEDIA.  FAMILY HISTORY:  Father is deceased at age 72, mother is still living.  FAMILY HISTORY:  Notable for diabetes, tachycardia and hypertension.  SOCIAL  HISTORY:  The patient is a 65 year old male who lives at home with his mother.  He has a college level education.  He is divorced and has 1 child.  He drinks 1-2 servings of caffeine daily.  Drinks beer twice a year, quit using tobacco in 1993.  REVIEW OF SYSTEMS:  Negative with the exception above.  PHYSICAL EXAMINATION:  VITAL SIGNS:  Blood pressure is 120/76, pulse 85, respiration 12, temperature 98.4. GENERAL:  The patient is alert and oriented x3.  Carries out 2-3 step commands without any difficulty. HEENT:  Pupils are equal, round, and reactive to light and conjugate gaze.  Extraocular movements are grossly intact.  He does show a mild right ptosis.  His visual fields grossly intact.  He has no nystagmus.  Face is symmetrical.  Tongue is midline.  Uvula is midline. NEURO:  The patient has no dysarthria or aphasia.  Facial sensation is grossly intact.  Coordination is smooth, finger-to-nose is smooth, heel- to-shin is smooth.  Gait was not tested and deferred.  Motor shows 5/5 strength throughout.  Deep tendon reflexes were 1+ throughout and downgoing toes.  The patient showed no drift in the  upper or lower extremities.  Sensation is grossly intact. PULMONARY:  Clear to auscultation. CARDIOVASCULAR:  S1-S2. NECK:  Negative for bruits.  LABORATORY DATA:  Sodium 136, potassium 4.9, chloride 99, CO2 31, BUN 16, creatinine 0.85, glucose is 461.  The patient states that he stays between 250 and 300 on a normal basis.  White blood cell count 10.6, platelets 205, hemoglobin 15.6, hematocrit 44.4.  ASSESSMENT:  This is a 65 year old male with myasthenia gravis with suboptimal relief with Mestinon but has improved with prednisone.  The patient is admitted under the guidance of his neurologist (Dr Pearlean Brownie) for thymectomy.The patient is presently in the hospital and neurology is consult while his inpatient stay for advice on plasma exchange.  RECOMMENDATIONS:  At this time would  recommend: 1. Continuing the patient's Mestinon 60 mg tabs one and half tabs five     times daily. 2. Continue prednisone 20 mg 2 tablets daily. 3. Recommend plasma exchange total of three times before elective thymectomy surgery on friday.    Felicie Morn, PA-C  I have examined the patient and agree with above clinical findings. ______________________________ Levie Heritage, MD    DS/MEDQ  D:  09/29/2010  T:  09/30/2010  Job:  [id number]  Electronically Signed by Felicie Morn PA-C on 10/03/2010 12:35:31 PM Electronically Signed by Levie Heritage MD on 10/03/2010 08:57:56 PM

## 2010-10-04 ENCOUNTER — Inpatient Hospital Stay (HOSPITAL_COMMUNITY): Payer: Medicare Other

## 2010-10-04 DIAGNOSIS — I10 Essential (primary) hypertension: Secondary | ICD-10-CM

## 2010-10-04 LAB — POCT I-STAT GLUCOSE
Glucose, Bld: 237 mg/dL — ABNORMAL HIGH (ref 70–99)
Operator id: 139621

## 2010-10-04 LAB — BASIC METABOLIC PANEL
BUN: 10 mg/dL (ref 6–23)
CO2: 28 mEq/L (ref 19–32)
Calcium: 8.6 mg/dL (ref 8.4–10.5)
Chloride: 105 mEq/L (ref 96–112)
Creatinine, Ser: 0.76 mg/dL (ref 0.4–1.5)
GFR calc Af Amer: >60 mL/min (ref >60)
GFR calc non Af Amer: >60 mL/min (ref >60)
Glucose, Bld: 140 mg/dL — ABNORMAL HIGH (ref 70–99)
Potassium: 3.4 mEq/L — ABNORMAL LOW (ref 3.5–5.1)
Sodium: 141 mEq/L (ref 135–145)

## 2010-10-04 LAB — GLUCOSE, CAPILLARY
Glucose-Capillary: 101 mg/dL — ABNORMAL HIGH (ref 70–99)
Glucose-Capillary: 104 mg/dL — ABNORMAL HIGH (ref 70–99)
Glucose-Capillary: 106 mg/dL — ABNORMAL HIGH (ref 70–99)
Glucose-Capillary: 110 mg/dL — ABNORMAL HIGH (ref 70–99)
Glucose-Capillary: 123 mg/dL — ABNORMAL HIGH (ref 70–99)
Glucose-Capillary: 126 mg/dL — ABNORMAL HIGH (ref 70–99)
Glucose-Capillary: 135 mg/dL — ABNORMAL HIGH (ref 70–99)
Glucose-Capillary: 159 mg/dL — ABNORMAL HIGH (ref 70–99)
Glucose-Capillary: 231 mg/dL — ABNORMAL HIGH (ref 70–99)
Glucose-Capillary: 274 mg/dL — ABNORMAL HIGH (ref 70–99)
Glucose-Capillary: 295 mg/dL — ABNORMAL HIGH (ref 70–99)
Glucose-Capillary: 89 mg/dL (ref 70–99)

## 2010-10-04 LAB — CBC
HCT: 35.4 % — ABNORMAL LOW (ref 39.0–52.0)
Hemoglobin: 11.9 g/dL — ABNORMAL LOW (ref 13.0–17.0)
MCH: 29.4 pg (ref 26.0–34.0)
MCHC: 33.6 g/dL (ref 30.0–36.0)
MCV: 87.4 fL (ref 78.0–100.0)
Platelets: 150 10*3/uL (ref 150–400)
RBC: 4.05 MIL/uL — ABNORMAL LOW (ref 4.22–5.81)
RDW: 14.9 % (ref 11.5–15.5)
WBC: 10.2 10*3/uL (ref 4.0–10.5)

## 2010-10-04 LAB — POCT I-STAT 3, ART BLOOD GAS (G3+)
Acid-Base Excess: 3 mmol/L — ABNORMAL HIGH (ref 0.0–2.0)
Bicarbonate: 27.6 mEq/L — ABNORMAL HIGH (ref 20.0–24.0)
O2 Saturation: 98 %
Patient temperature: 97.8
TCO2: 29 mmol/L (ref 0–100)
pCO2 arterial: 41 mmHg (ref 35.0–45.0)
pH, Arterial: 7.435 (ref 7.350–7.450)
pO2, Arterial: 93 mmHg (ref 80.0–100.0)

## 2010-10-05 ENCOUNTER — Inpatient Hospital Stay (HOSPITAL_COMMUNITY): Payer: Medicare Other

## 2010-10-05 DIAGNOSIS — I4949 Other premature depolarization: Secondary | ICD-10-CM

## 2010-10-05 LAB — COMPREHENSIVE METABOLIC PANEL
ALT: 18 U/L (ref 0–53)
AST: 16 U/L (ref 0–37)
Albumin: 3 g/dL — ABNORMAL LOW (ref 3.5–5.2)
Alkaline Phosphatase: 51 U/L (ref 39–117)
BUN: 11 mg/dL (ref 6–23)
CO2: 29 mEq/L (ref 19–32)
Calcium: 9.2 mg/dL (ref 8.4–10.5)
Chloride: 101 mEq/L (ref 96–112)
Creatinine, Ser: 0.67 mg/dL (ref 0.4–1.5)
GFR calc Af Amer: >60 mL/min (ref >60)
GFR calc non Af Amer: >60 mL/min (ref >60)
Glucose, Bld: 208 mg/dL — ABNORMAL HIGH (ref 70–99)
Potassium: 4.2 mEq/L (ref 3.5–5.1)
Sodium: 137 mEq/L (ref 135–145)
Total Bilirubin: 1 mg/dL (ref 0.3–1.2)
Total Protein: 4.7 g/dL — ABNORMAL LOW (ref 6.0–8.3)

## 2010-10-05 LAB — GLUCOSE, CAPILLARY
Glucose-Capillary: 192 mg/dL — ABNORMAL HIGH (ref 70–99)
Glucose-Capillary: 174 mg/dL — ABNORMAL HIGH (ref 70–99)
Glucose-Capillary: 193 mg/dL — ABNORMAL HIGH (ref 70–99)
Glucose-Capillary: 257 mg/dL — ABNORMAL HIGH (ref 70–99)
Glucose-Capillary: 145 mg/dL — ABNORMAL HIGH (ref 70–99)
Glucose-Capillary: 302 mg/dL — ABNORMAL HIGH (ref 70–99)

## 2010-10-05 LAB — CBC
HCT: 36 % — ABNORMAL LOW (ref 39.0–52.0)
Hemoglobin: 12 g/dL — ABNORMAL LOW (ref 13.0–17.0)
MCH: 29.3 pg (ref 26.0–34.0)
MCHC: 33.3 g/dL (ref 30.0–36.0)
MCV: 87.8 fL (ref 78.0–100.0)
Platelets: 165 10*3/uL (ref 150–400)
RBC: 4.1 MIL/uL — ABNORMAL LOW (ref 4.22–5.81)
RDW: 14.8 % (ref 11.5–15.5)
WBC: 10.6 10*3/uL — ABNORMAL HIGH (ref 4.0–10.5)

## 2010-10-06 LAB — GLUCOSE, CAPILLARY
Glucose-Capillary: 195 mg/dL — ABNORMAL HIGH (ref 70–99)
Glucose-Capillary: 78 mg/dL (ref 70–99)

## 2010-10-07 LAB — GLUCOSE, CAPILLARY: Glucose-Capillary: 290 mg/dL — ABNORMAL HIGH (ref 70–99)

## 2010-10-10 ENCOUNTER — Other Ambulatory Visit: Payer: Self-pay | Admitting: *Deleted

## 2010-10-10 MED ORDER — ZOLPIDEM TARTRATE 10 MG PO TABS: 10.0000 mg | ORAL_TABLET | Freq: Every evening | ORAL | Status: DC | PRN

## 2010-10-10 NOTE — Telephone Encounter (Signed)
Last seen 12/11. Sent request to front desk to sch appt.

## 2010-10-10 NOTE — Telephone Encounter (Signed)
Pt would like to get 5 refills.

## 2010-10-13 ENCOUNTER — Telehealth: Payer: Self-pay | Admitting: *Deleted

## 2010-10-13 ENCOUNTER — Telehealth: Payer: Self-pay | Admitting: Internal Medicine

## 2010-10-13 NOTE — Telephone Encounter (Addendum)
RTC to pt said he wants to get 5 refills of his Ambien.  Said that he only wants to get the refills available when he neeeds the refills.  Has been a delay per pt in getting the refills sent to the pharmacy.  York Spaniel that he has a lot of appointments with doctors since his surgery.  May not keep appointment with Dr. Tonny Branch on 10/31/2010.  Pt said that he has already seen Dr. Tonny Branch.  Explained to pt reason for the visit and the 1 refill authorization.  Pt talked about his past surgery and that he just did not feel the need to come in.  Said that he would try to come in next month instead.  Pt was informed that  Dr. Tonny Branch would be notified of what was discussed about the refills.

## 2010-10-13 NOTE — Telephone Encounter (Signed)
Pt states that he was in the hospital and saw Dr Graciela Husbands while in the hospital. He thinks that Dr Graciela Husbands wants to see him no and not wait to July for the recall. Pt would like to know if that is what Graciela Husbands wants

## 2010-10-13 NOTE — Op Note (Signed)
  NAME:  [name], [name] NO.:  [id number]  MEDICAL RECORD NO.:  [id number]           PATIENT TYPE:  LOCATION:                                 FACILITY:  PHYSICIAN:  Sheliah Plane, MD    DATE OF BIRTH:  [birth year]  DATE OF PROCEDURE:  10/03/2010 DATE OF DISCHARGE:                              OPERATIVE REPORT   PREOPERATIVE DIAGNOSIS:  Myasthenia gravis.  POSTOPERATIVE DIAGNOSIS:  Myasthenia gravis.  SURGICAL PROCEDURES:  Total thymectomy via partial sternal split.  SURGEON:  Sheliah Plane, MD  FIRST ASSISTANT:  Gershon Crane, PA  BRIEF HISTORY:  The patient is a 65 year old male followed by Dr. Pearlean Brownie for symptomatic myasthenia gravis.  The patient has been maintained on Mestinon and prednisone 20 mg twice a day with some resolution of symptoms.  Imaging did not reveal any evidence of thymoma but with his symptoms, very high antibody levels, thymectomy was recommended after several months of consideration on part of the patient.  He ultimately decided to agreed to proceed with thymectomy.  DESCRIPTION OF PROCEDURE:  With adequate monitoring lines in place, a time-out completed.  The patient underwent general endotracheal anesthesia without incident.  Anesthesia was aware of his myasthenia and no muscle paralysis was used.  The chest was prepped with Betadine and draped in usual sterile manner.  Incision was made over the upper portion of the sternum just at the level of the manubrium.  A partial splitting incision in the sternum was made and used a lamina spreader and the upper sternum was separated.  This gave exposure to the anterior mediastinum.  The innominate vein was thus dissected out and identified and thymic fat was carefully dissected from the innominate vein inferiorly and deep to the pericardium.  Both pleural spaces were entered as the lateral aspects of the thymus resected.  Consistent with the imaging, no definite thymoma or mass in the  thymus was appreciated. The thymus in its entirety was submitted to pathology for examination. A separate site in the upper portion of the chest, a Blake drain was placed in anterior mediastinal space and brought out.  The sternum was then reapproximated with four #6 stainless steel wires.  Fascia was closed with interrupted 0 Vicryl and 3-0 Vicryl subcutaneous tissue and 4-0 subcuticular stitch and in the skin edges dry dressings were applied.  Sponge and needle count was reported as correct.  At completion of the procedure, the patient tolerated procedure without obvious complication and was extubated in operating room and transferred to the recovery room for postoperative care.  Blood loss was minimal.     Sheliah Plane, MD     EG/MEDQ  D:  10/07/2010  T:  10/07/2010  Job:  [id number]  cc:   Pramod P. Pearlean Brownie, MD  Electronically Signed by Sheliah Plane MD on 10/13/2010 12:24:47 PM

## 2010-10-13 NOTE — Telephone Encounter (Signed)
lmtcb ./cy 

## 2010-10-13 NOTE — Discharge Summary (Signed)
NAME:  [name], [name] NO.:  [id number]  MEDICAL RECORD NO.:  [id number]           PATIENT TYPE:  I  LOCATION:  2001                         FACILITY:  [facility]  PHYSICIAN:  Sheliah Plane, MD    DATE OF BIRTH:  08-Apr-1946  DATE OF ADMISSION:  09/29/2010 DATE OF DISCHARGE:  10/06/2010                              DISCHARGE SUMMARY   HISTORY:  The patient is a 65 year old male who has had 3-4 months history of difficulty in swallowing, thought to be perhaps related to esophageal difficulty.  He underwent thorough evaluation including Neurology and was diagnosed with myasthenia gravis.  He was referred to Dr. Sheliah Plane for consideration of thymectomy and admitted this hospitalization for preoperative plasmapheresis as well as surgical thymectomy.  PAST MEDICAL HISTORY:  Includes hypertension, hyperlipidemia, diabetes mellitus type 2 poorly controlled, history of former smoker, quit in 1993, history of cervical disk disease.  No previous surgery.  MEDICATIONS PRIOR TO ADMISSION: 1. Benazepril 40 mg daily. 2. Glipizide 10 mg twice daily. 3. Metformin 1000 mg twice daily. 4. Prednisone 20 mg twice daily. 5. Pyridostigmine 60 mg 1.5 tablets 5 times daily. 6. Vitamin A 10,000 units daily. 7. Vitamin B12 2 tablets twice daily. 8. Vitamin D 3 2000 units 5 capsules q.a.m. 9. Vitamin E 1 tablet twice daily 200 units. 10.Ambien p.r.n.  Family history, social history, review of symptoms, and physical exam, please see the history and physical done at the time of admission.  HOSPITAL COURSE:  The patient was admitted electively and a Diatek catheter was placed by Dr. Tyrone Sage.  On September 29, 2010, he subsequently underwent plasmapheresis as managed by the neurologist.  He was deemed to be acceptable proceeding with surgery and on October 03, 2010, he underwent the following procedure, complete thymectomy via partial sternotomy.  He tolerated the procedure well and  was taken to the surgical intensive care unit in stable condition.  POSTOPERATIVE HOSPITAL COURSE:  The patient has done well.  He has maintained stable hemodynamics although has been somewhat hypertensive at times.  He is also having some borderline tachycardia with premature atrial contractions and has been started on beta-blocker with improvement in this.  His Diatek catheter was removed on October 06, 2010. He has been weaned from oxygen, maintains good saturations on room air. He is tolerating gradually increasing activity using standard protocols. Incision is healing without evidence of infection.  He is deemed to be acceptable for discharge on today's date, October 06, 2010.  MEDICATIONS ON DISCHARGE:  As preoperatively.  Additionally, he will be given a prescription for: 1. Metoprolol 25 mg p.o. b.i.d. 2. Oxycodone 5 mg 1 every 4-6 hours as needed for pain.  FOLLOWUP:  Include Dr. Tyrone Sage on Oct 23, 2010, at 12 noon with a chest x-ray.  He is also to see Dr. Pearlean Brownie for Neurology in 2 weeks.  He understands this plan.  FINAL DIAGNOSIS:  Myasthenia gravis status post thymectomy.  OTHER DIAGNOSES: 1. Hypertension. 2. Diabetes mellitus type 2. 3. Hyperlipidemia. 4. History of degenerative cervical spine disease.     Rowe Clack, P.A.-C.  ______________________________ Sheliah Plane, MD    WEG/MEDQ  D:  10/06/2010  T:  10/07/2010  Job:  [id number]  cc:   Pramod P. Pearlean Brownie, MD Duke Salvia, MD, [facility]  Electronically Signed by Gershon Crane P.A.-C. on 10/07/2010 01:44:09 PM Electronically Signed by Sheliah Plane MD on 10/13/2010 12:24:42 PM

## 2010-10-13 NOTE — Telephone Encounter (Signed)
Pharmacy called with refill.  Pt needs to come in prior to further refills on the Ambien.  Pt has scheduled an appointment for 10/2010.

## 2010-10-13 NOTE — Telephone Encounter (Signed)
SPOKE WITH PT  UNABLE TO TOLERATE METOPROLOL 25 MG BID  C/O COLD SWEAT, CLAMMY, AND NAUSEATED  HAS NOT TAKEN AT ALL TODAY WILL TRY AND TAKE 1/2 PILL LATER TODAY AND WILL HOLD AM DOSE UNTIL  HE HEARS FROM [place] TOM .PT AWARE WILL DISCUSS WITH DR Graciela Husbands.

## 2010-10-14 ENCOUNTER — Other Ambulatory Visit: Payer: Self-pay | Admitting: *Deleted

## 2010-10-14 NOTE — Telephone Encounter (Signed)
Tell him to stop beta blocker;  Needs followup in 10-12 weeks

## 2010-10-15 ENCOUNTER — Telehealth: Payer: Self-pay | Admitting: Internal Medicine

## 2010-10-15 MED ORDER — BENAZEPRIL HCL 40 MG PO TABS: 40.0000 mg | ORAL_TABLET | Freq: Every day | ORAL | Status: DC

## 2010-10-15 NOTE — Telephone Encounter (Signed)
I actually have never seen this patient.  He no showed to his last appointment with me.  He was given a refill by phone but will need to keep his appointment with me to get additional refills.  We can discuss this at his follow up 11/03/10.

## 2010-10-15 NOTE — Telephone Encounter (Signed)
Pt rtn your call/lg °

## 2010-10-15 NOTE — Telephone Encounter (Signed)
I have left a message for the pt to call. Sherri Rad, RN, BSN

## 2010-10-15 NOTE — Telephone Encounter (Signed)
Pt needs call re having problem with metoprolol 25mg , stopped yesterday but tried to take  half a pill last night an still having problem  With that and other meds can change ?uses [facility]-pls call [phone]

## 2010-10-15 NOTE — Telephone Encounter (Signed)
LMTC. See 5/7 phone note. Sherri Rad, RN, BSN

## 2010-10-15 NOTE — Telephone Encounter (Signed)
I called the pt and made him aware of Dr. Odessa Fleming recommendations to d/c beta blocker (metoprolol). The pt is a little concerned by not replacing this with something because he had HR's in the 130's during his most recent hospitalization. He was given metoprolol 25mg  to take and states he felt clammy and lightheaded with this. He took metoprolol 12.5mg  the day before yesterday and tolerated it ok, but still felt like he may become clammy and lightheaded. He does not have a bp monitor at home. I have mailed a prescription to him for this so he can obtain a monitor for home use. I will review with Dr. Graciela Husbands again to see if he still wants to d/c beta blocker or does he want to replace it due to elevated HR's in the hospital. The pt is aware I will contact him as soon as I discuss with Dr. Graciela Husbands. He is agreeable. Sherri Rad, RN, BSN

## 2010-10-20 ENCOUNTER — Encounter: Payer: Self-pay | Admitting: *Deleted

## 2010-10-20 NOTE — Telephone Encounter (Signed)
The pt was started  Beta blockers with carefollowing his thymectomy. I would suggest we resume metoprolol 12.5 twice daily. If this is not tolerated I would be inclined as he gets further out from his thymectomy to try alternative beta blockers. Please arrange followup in the next 3 or 4 weeks.

## 2010-10-21 NOTE — Telephone Encounter (Signed)
LMTC

## 2010-10-21 NOTE — Telephone Encounter (Signed)
I spoke with the patient and made his aware that Dr. Graciela Husbands would like him to try metoprolol 12.5mg  bid. He states that he did try the metoprolol 12.5mg  and did not feel well. I explained I will talk with Dr. Graciela Husbands about an alternative and call him back tomorrow with Dr. Odessa Fleming recommendation and when we can see him back. He is agreeable.

## 2010-10-21 NOTE — Telephone Encounter (Signed)
I attempted call the patient. Line busy x 2 attempts. I will call back later.

## 2010-10-21 NOTE — Letter (Signed)
February 08, 2008    Valetta Close, MD  [address], [place] [address]   RE:  [name]  MRN:  [id number]  /  DOB:  11/17/1945   Dear Christiane Ward:   It was a pleasure to see [name] at your request regarding his  bifascicular block.  This is old and longstanding with tracings  demonstrating this going back to 2007 with incomplete right bundle-  branch block having been seen when we first saw him in our office in  2006.   He has no complaints of syncope.  He does have episodes of orthostatic  intolerance, but no other episodes of lightheadedness apart from that.   He notes chronic energy depletion, which he relates to his diabetes.  He  notes no accompanying shortness of breath.   He also describes a non-exertional chest tightness.  This is a band that  goes across and into his axilla bilaterally.  He thinks that this  predates his catheterization in 2006, which demonstrated nonobstructive  coronary artery disease and normal left ventricular function.   His past medical history is notable for his diabetes and some amount of  his hypoglycemics has been associated with chronic diarrhea.  These  include glipizide and metformin.  He also takes a benazepril for renal  protection.   He previously took a beta-blocker because of his persistent sinus  tachycardia.  He is no longer taking that.   SOCIAL HISTORY:  He is now retired from [employer], and he is working  Stage manager.  He does not use tobacco.   His review of systems in addition is notable for dyslipidemia.   He is widowed.   His medications are as noted.   He has no known drug allergies.   On examination, his weight was 206 pounds, which is down 10 pounds in  the last 2 years and his pulse is 102.  His neck veins are flat.  His  carotids are brisk and full bilaterally without bruits.  His back is  without kyphosis or scoliosis.  Lungs are clear.  Heart sounds are  regular without murmurs or  gallops.  The abdomen is soft with active  bowel sounds without midline pulsation or hepatomegaly.  Femoral pulses  are 2+, distal pulses are intact.  There is no clubbing, cyanosis, or  edema.  Neurological exam is grossly normal.  The skin is warm and dry.   Electrocardiogram dated today demonstrated sinus rhythm at 102, P-wave  vectors were consistent with this being the sinus focus as we had seen  previously.   There is right bundle-branch block and right axis deviation to 292  degrees.   IMPRESSION:  1. Bifascicular block - chronic.  2. Orthostatic intolerance.  3. Diabetes, likely contributing to orthostatic intolerance.  4. Hypertension.  5. Chest pain with an intercurrent normal coronary angiogram.   Christiane Ward, [name]' electrocardiogram is concerning, but at this point  he lacks any specific symptoms.  We have discussed the number of non-  pharmacological interventions related to his orthostatic hypotension.   I have also suggested that if his chest pain syndrome worsens, that it  maybe appropriate to consider Myoview scanning.   We will plan to see him again in 6 months' time.    Sincerely,      Danny Salvia, MD, [facility]  Electronically Signed    SCK/MedQ  DD: 02/08/2008  DT: 02/09/2008  Job #: [id number]

## 2010-10-21 NOTE — Consult Note (Signed)
NEW PATIENT CONSULTATION   [name], [name]  DOB:  Apr 11, 1946                                        June 20, 2010  CHART #:  [id number]   FOLLOWUP CARDIOLOGIST:  Duke Salvia, MD, [facility]   PRIMARY CARE PHYSICIAN:  [facility].   REASON FOR CONSULTATION:  Myasthenia gravis, consider for a thymectomy.   HISTORY OF PRESENT ILLNESS:  The patient is a 65 year old male who notes  3-4 months ago some difficulty in swallowing, thought that perhaps he  needed an esophageal dilatation but sought no attention about this.  Three weeks ago, he noted especially when eating a stromboli sandwich  having significant difficulty chewing.  This rapidly over several visits  to the emergency room resulted in evaluation for possible stroke.  Head  CT scan was normal and he was discharged home.  He returns several days  later with increasing difficulty swallowing and now with his eyelids  drooping.  He was seen by Dr. Pearlean Brownie and diagnosis of myasthenia was  strongly considered.  He was started on medication, pyridostigmine with  some improvement.  He was seen by Dr. Pearlean Brownie yesterday and was given a  prescription for prednisone which he has not started yet and was  instructed to increase his doses.  His acetylcholine binding antibody  came back significantly elevated at 26.8 with positive anything over 0.5  nM/L.  Dr. Pearlean Brownie called yesterday and requested that the patient be seen  to discuss thymectomy.  A CT scan of the chest was done.  There is no  definite thymoma present; however, it is felt that consideration of  thymectomy would benefit him.  He was at first reluctant to come to the  office but agreed to be seen today to discuss the treatment options  involved.  He notes that he has been reading the internet a lot and  complained that this was difficult with his eyelids drooping and having  to be taped open to be able to read.  He notes that he has lost weight  over the  past 3 weeks 210-194 because of increasing difficulty  swallowing and chewing.   PAST MEDICAL HISTORY:  History of hypertension.  He denies  hyperlipidemia.  He has had type 2 diabetes for years, notes his most  recent hemoglobin A1c is 10.  He has been previously a smoker, quit in  1993.  He has had no previous stroke.  No previous hospital admissions.  He does have known cervical disk disease.  He had cardiac  catheterization in 2006 by Dr. Graciela Husbands.  He has had no previous surgery.  He is single, lives with his mother.   CURRENT MEDICATIONS:  Reviewed and including pyridostigmine 60 mg 5  times a day, benazepril 6 mg a day, metformin 1000 mg twice a day,  glyburide 20 mg a day, zolpidem 10 mg a day.  He discontinued his  diltiazem.  He has been discussing use of various vitamins including  magnesium.  I have cautioned him not to take magnesium supplementation.   ALLERGIES:  Denies any allergies.   REVIEW OF SYSTEMS:  CARDIAC:  Negative for chest pain, resting shortness  of breath, exertional shortness of breath, lower extremity edema.  GENERAL:  As noted, he has had weight loss, difficulty with vision,  drooping eyelids right worse  than left.  Denies any change in bowel  habits.  Denies hematuria or nocturia.  Currently on his medications.  He has had no change in bowel habits.   PHYSICAL EXAMINATION:  His blood pressure 179/91, pulse 118, respiratory  rate 16, O2 sats 94%.  His weight is 194 pounds.  He has no carotid  bruits.  As noted, both of his eyelids are droopy.  He has pieces of  surgical tape holding them open.  He is able to ambulate without  difficulty.  He has good strength in the upper and lower extremities.  Abdominal exam is benign without palpable masses or tenderness.   IMAGING:  The patient's CT scan is reviewed.  There is no evidence of  thymoma.  As noted, his acetylcholine binding antibody is markedly  elevated.   ASSESSMENT AND PLAN:  After discussion with  Dr. Pearlean Brownie and with the  patient, I have recommended to him that we proceed with consideration of  plasmapheresis for several days and then thymectomy with partial  sternotomy to help with decreasing his symptoms and need for  medications.  The patient listened to the recommendation but noted that  he wished to try the medication that Dr. Pearlean Brownie has prescribed for him.  He had not started the prednisone yet before having any surgery.  It is  possible that we will consent to plasmapheresis and thymectomy in the  near future.  I have cautioned him about too much experimental  experimentation with mega doses of vitamins and minerals in the  treatment of his disease.  He has close follow up with Dr.  Pearlean Brownie as his treatment goes, and if he changes his mind and wishes to  proceed with the surgical assisting his treatment he will be willing to  proceed.   Sheliah Plane, MD  Electronically Signed   EG/MEDQ  D:  06/20/2010  T:  06/21/2010  Job:  [id number]   cc:   Pramod P. Pearlean Brownie, MD  [facility].

## 2010-10-21 NOTE — Assessment & Plan Note (Signed)
[facility]                         ELECTROPHYSIOLOGY OFFICE NOTE   NAME:SILOSEkin, Pilar                        MRN:          [id number]  DATE:08/08/2008                            DOB:          12-29-1945    Mr. Velasquez was seen in followup for chest pains which he found were  attributable to the way he sat in his pickup truck;  they have been gone  for some months.  He also has a little bit of orthostatic dizziness  which is not changed.  This may be important in the context of his  underlying bifascicular block.   His medications currently include:  1. Metformin 500 b.i.d.  2. Lisinopril 20.  3. Glipizide.   His blood pressure was 120/80, his pulse was 116, and looking back over  the records he has had tachycardia for many years.  His lungs were  clear.  Heart sounds were regular.  Extremities were without edema.   Electrocardiogram demonstrated today sinus rhythm at 116 with intervals  of 0.14/0.15/0.37.  The axis was 90 degrees.   IMPRESSION:  1. Sinus tachycardia.  2. Bifascicular block with right bundle-branch block/right axis      deviation.  3. Chest pain, resolved.  4. Diabetes.   Mr. Frontera' heart rate remains rapid and I am not quite sure why.  When  he sees Dr. Noel Gerold again, I will ask him to get a thyroid, but I suspect  that this has been looked at  multiple times over the intervening years.  The other concern is tachycardia-induced cardiomyopathy, and this is  unlikely with sinus rhythm as there is typically nocturnal relative  bradycardia.  We will plan though to give him a trial of beta-blockers,  atenolol 25 and nadolol 20 to see if he can tolerate either one of these  as he was not able to tolerate Toprol in the past without significant  fatigue.   We will see him again in 8 months' time and we may consider an echo at  that time.      Duke Salvia, MD, [facility]  Electronically Signed    SCK/MedQ  DD: 08/08/2008  DT:  08/09/2008  Job #: [id number]   cc:   Valetta Close, M.D.

## 2010-10-22 ENCOUNTER — Ambulatory Visit (INDEPENDENT_AMBULATORY_CARE_PROVIDER_SITE_OTHER): Payer: Medicare Other | Admitting: Critical Care Medicine

## 2010-10-22 ENCOUNTER — Ambulatory Visit
Admission: RE | Admit: 2010-10-22 | Discharge: 2010-10-22 | Disposition: A | Discharge status: Home / Self Care | Payer: Medicare Other | Source: Ambulatory Visit | Attending: Cardiothoracic Surgery | Admitting: Cardiothoracic Surgery

## 2010-10-22 ENCOUNTER — Other Ambulatory Visit: Payer: Self-pay | Admitting: Cardiothoracic Surgery

## 2010-10-22 ENCOUNTER — Encounter: Payer: Self-pay | Admitting: Critical Care Medicine

## 2010-10-22 DIAGNOSIS — J984 Other disorders of lung: Secondary | ICD-10-CM

## 2010-10-22 DIAGNOSIS — J4489 Other specified chronic obstructive pulmonary disease: Secondary | ICD-10-CM

## 2010-10-22 DIAGNOSIS — J449 Chronic obstructive pulmonary disease, unspecified: Secondary | ICD-10-CM

## 2010-10-22 NOTE — Telephone Encounter (Signed)
If you've not able to tolerate a beta blocker perhaps a low-dose calcium blood work. Let's try verapamil 120 mg daily.

## 2010-10-22 NOTE — Patient Instructions (Signed)
No change in medications. Return in       6 months I will call with timing for next CT chest if this is necessary

## 2010-10-22 NOTE — Progress Notes (Signed)
Subjective:    Patient ID: [name], male    DOB: [birth year], 65 y.o.   MRN: [id number]  HPI 65 yo WM referred for abn CXR.       This is a 65 year old male who presents with an abnormal radiology finding.  The patient complains of cough, but denies history of diagnosed COPD, shortness of breath, chest tightness, chest pain worse with breathing and coughing, wheezing, mucous production, nocturnal awakening, exercise induced symptoms, and congestion.    This pt has a hx of mild throat clearing and 30ys of smoking and quit smoking  1993.  Pt desired a CXR per PCP and was obtained .This revealed peribronchial thickening and  pt referred to pulm for eval. Pt denies any dyspnea or chest pain.  There is not any mucus or excess wheeze.  The pt has no specific pulm symptoms at this time.  Pt denies any significant sore throat, nasal congestion or excess secretions, fever, chills, sweats, unintended weight loss, pleurtic or exertional chest pain, orthopnea PND, or leg swelling Pt denies any increase in rescue therapy over baseline, denies waking up needing it or having any early am or nocturnal exacerbations of coughing/wheezing/or dyspnea. Pt also denies any obvious fluctuation in symptoms wiht weather or environmental change or other alleviating or aggravating factors  Spirometry obtained today and is normal.June 18, 2010 3:29 PM Pt now with myasthenia gravis.  Pt with non calc 5mm nodule. No other changes.  Pt denies any significant sore throat, nasal congestion or excess secretions, fever, chills, sweats, unintended weight loss, pleurtic or exertional chest pain, orthopnea PND, or leg swelling Pt denies any increase in rescue therapy over baseline, denies waking up needing it or having any early am or nocturnal exacerbations of coughing/wheezing/or dyspnea.  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  Also had plasmapheresis.  Had dysphagia. Had eye drooping.    The eye drooping and dysphagia is better.  Dyspnea is better.  Mestinon helped.    Review of Systems Constitutional:   No  weight loss, night sweats,  Fevers, chills, fatigue, lassitude. HEENT:   No headaches,  Difficulty swallowing,  Tooth/dental problems,  Sore throat,                No sneezing, itching, ear ache, nasal congestion, post nasal drip,   CV:  No chest pain,  Orthopnea, PND, swelling in lower extremities, anasarca, dizziness, palpitations  GI  No heartburn, indigestion, abdominal pain, nausea, vomiting, diarrhea, change in bowel habits, loss of appetite  Resp: No shortness of breath with exertion or at rest.  No excess mucus, no productive cough,  No non-productive cough,  No coughing up of blood.  No change in color of mucus.  No wheezing.  No chest wall deformity  Skin: no rash or lesions.  GU: no dysuria, change in color of urine, no urgency or frequency.  No flank pain.  MS:  No joint pain or swelling.  No decreased range of motion.  No back pain.  Psych:  No change in mood or affect. No depression or anxiety.  No memory loss.     Objective:   Physical Exam Filed Vitals:   10/22/10 1414  BP: 124/66  Pulse: 125  Temp: 98.8 F (37.1 C)  TempSrc: Oral  Height: 5\' 7"  (1.702 m)  Weight: 210 lb 6.4 oz (95.437 kg)  SpO2: 96%    Gen: Pleasant, well-nourished, in no distress,  normal affect  ENT: No lesions,  mouth clear,  oropharynx clear, no postnasal drip  Neck: No JVD, no TMG, no carotid bruits  Lungs: No use of accessory muscles, no dullness to percussion, clear without rales or rhonchi  Cardiovascular: RRR, heart sounds normal, no murmur or gallops, no peripheral edema  Abdomen: soft and NT, no HSM,  BS normal  Musculoskeletal: No deformities, no cyanosis or clubbing  Neuro: alert, non focal  Skin: Warm, no lesions or rashes      PFT Conversion 04/09/2010  FVC PREDICT 4.111  FVC  % Predicted 81.99  FEV1 PREDICT 3.063  FEV % Predicted 85.61   FEV1/FVC PRE 74.637  FEV1/FVC%EXP 104.239  FEF % EXPEC 93.798   CT of Chest  Procedure date:  05/30/2010  Findings:      Centrilobular emphysema,  LLL 3mm noncalcified granuloma  Assessment & Plan:

## 2010-10-23 ENCOUNTER — Encounter (INDEPENDENT_AMBULATORY_CARE_PROVIDER_SITE_OTHER): Payer: Self-pay | Admitting: Cardiothoracic Surgery

## 2010-10-23 ENCOUNTER — Telehealth: Payer: Self-pay | Admitting: Critical Care Medicine

## 2010-10-23 DIAGNOSIS — G7000 Myasthenia gravis without (acute) exacerbation: Secondary | ICD-10-CM

## 2010-10-23 NOTE — Telephone Encounter (Signed)
Call the pt and tell him he does not need further CT scans, the current nodule is no longer apparent on scans

## 2010-10-23 NOTE — Assessment & Plan Note (Addendum)
Mild Copd , prior hx of dysphagia which in retrospect was due to myasthenia gravis Plan No need for inhaled meds

## 2010-10-23 NOTE — Assessment & Plan Note (Signed)
OFFICE VISIT  [name], [name] DOB:  Oct 31, 1945                                        Oct 23, 2010 CHART #:  [id number]  The patient returns to the office today in followup after his partial sternotomy, total thymectomy on October 03, 2010, for myasthenia gravis. Since discharge, the patient continues to do well.  He notes that he has been "plane with his medicines" over the past week and temporarily had stopped his prednisone and Mestinon for 3 days without difficulty.  He did note that he ate a steak last night without any chewing difficulty. He has now resumed back on his prednisone and Mestinon until he sees Dr. Pearlean Brownie early next week who plans on tapering down his medication dose.  On exam, he has had some trouble with, originally he saw Dr. Graciela Husbands for tachycardia.  While in the hospital, we had tried to get him on low dose and beta-blocker.  However, he is not like taking this and is currently off beta-blocker.  Per Dr. Graciela Husbands on exam today, his blood pressure is 160/94, pulse is 110-111.  His sternum is stable and well healed.  His lungs are clear bilaterally.  He has no obvious lid droop as he did when I first saw him.  Followup chest x-ray shows clear lung fields bilaterally.  Overall, he continues, his medication list is reviewed and epic.  He will further discuss his medications with Dr. Pearlean Brownie early next week.  I will plan to see him back in 3 months.  Sheliah Plane, MD Electronically Signed  EG/MEDQ  D:  10/23/2010  T:  10/23/2010  Job:  [id number]  cc:   Pramod P. Pearlean Brownie, MD Duke Salvia, MD, [facility]

## 2010-10-23 NOTE — Telephone Encounter (Signed)
Pt returned call from crystal. Tivis Ringer

## 2010-10-23 NOTE — Telephone Encounter (Signed)
[id number]

## 2010-10-23 NOTE — Telephone Encounter (Signed)
Pt called back.  He was informed of below recs and results per PW.  He verbalized understanding of this.

## 2010-10-23 NOTE — Assessment & Plan Note (Signed)
LLL pulmonary nodule, now no longer present on current CT Plan No further CT images needed

## 2010-10-24 NOTE — Cardiovascular Report (Signed)
NAME:  [name], [name] NO.:  [id number]   MEDICAL RECORD NO.:  [id number]          PATIENT TYPE:  OIB   LOCATION:  6501                         FACILITY:  [facility]   PHYSICIAN:  Charlton Haws, M.D.     DATE OF BIRTH:  1946-04-06   DATE OF PROCEDURE:  11/17/2004  DATE OF DISCHARGE:                              CARDIAC CATHETERIZATION   [name] is a 65 year old patient referred to [facility] for  abnormal echocardiogram, chest pain, shortness of breath, and resting  tachycardia.  Right and left heart catheterization was done to assess for  cardiomyopathy and rule out coronary artery disease given his diabetes.   Standard catheterization was done from the right femoral artery and vein.  A  5-French arterial sheath was used.  A 7-French venous sheath was used.   Left main coronary artery was normal.   Left anterior descending artery was normal.   Circumflex coronary artery was nondominant and normal.   The right coronary artery was dominant and normal.   RAO ventriculography was normal.  There was hyperdynamic LV function.  Ejection fraction was 70%.  There was no gradient across the aortic valve,  no MR.  Right heart pressures were normal.  Mean right atrial pressure was  2.  RV pressure was 24/1.  PA pressure was 18/9.  Mean pulmonary capillary  wedge pressure was 3.  LV pressure was 110/5.  Aortic pressure was 110/82.   IMPRESSION:  The patient has a normal heart without evidence of elevated  heart pressures.  His resting tachycardia would not appear to be due to poor  cardiac output.  His chest pain would appear to be noncardiac in etiology.   Will continue him on his Toprol at 50 mg a day.  He has had a normal TSH at  [facility] in the last six months; however, we will check a repeat TSH, T4, and T3  here in the laboratory today.  He will follow up with Dr. Royetta Asal in regards  to his general medical care.  From a cardiac perspective he would also be  cleared for any neurosurgical procedure by Dr. Danielle Dess for his syrinx.       PN/MEDQ  D:  11/17/2004  T:  11/17/2004  Job:  [id number]   cc:   Will Averett, M.D.  [address] [place] [address]  Fax: [phone]   Stefani Dama, M.D.  [address]  [place]  [place] [address]  Fax: [phone]

## 2010-10-24 NOTE — Assessment & Plan Note (Signed)
[facility]                           ELECTROPHYSIOLOGY OFFICE NOTE   NAME:[name], [name]                        MRN:          [id number]  DATE:03/01/2006                            DOB:          06-29-1945    [name] has a persistent tachycardia. Is now improved on Toprol-XL 50 mg.  He is not taking his metformin currently because he did not like the 850 and  he was only taking the 500 regularly.  We have talked about the importance  of taking this regularly.   Blood pressure remains elevated and his salt intake has been poorly managed.  We reviewed his diet.  He also has significant nocturnal obstructive  breathing, daytime somnolence and poor sleep hygiene.   PHYSICAL EXAMINATION:  VITAL SIGNS:  Blood pressure elevated at 147/90,  pulse 89.  LUNGS:  Clear.  HEART:  Regular.  EXTREMITIES:  Without edema.   Electrocardiogram dated today demonstrated what appears to be sinus rhythm  by T wave morphology with borderline left posterior fascicular block.   IMPRESSION:  1. Tachycardia, probably sinus.  2. Bifascicular block.  3. Diabetes.  4. Hypertension.  5. Poor sleep hygiene.   1. We have increased his lisinopril to 40/12.5.  2. Prescription for metformin 500 mg b.i.d. which he said he will take.  3. Refer him back to the internal medicine teaching service for close      management of his BMET in a couple of weeks.  4. Recommend that he consider sleep study.  5. Will see him again in one year.                                   Duke Salvia, MD, [facility]   SCK/MedQ  DD:  03/01/2006  DT:  03/02/2006  Job #:  [id number]   cc:   Internal Medicine Teaching Service

## 2010-10-27 NOTE — Telephone Encounter (Signed)
Tried to call patient; Line was busy I will need to discuss with Dr. Pearlean Brownie how long post surgery we have to worry about beta blockers. The patient's tachycardia has been persistent for a long time. We will work on having an answer for him when we see him in June

## 2010-10-27 NOTE — Telephone Encounter (Signed)
I called and spoke with the pt to make him aware of Dr. Odessa Fleming recommendations. He asked if the verapamil was checked against the Myasthenia Gravis list. I explained I was not certain about this. He states that the metoprolol had a "caution" on it with his MG. He asked that I contact the pharmacy at the hospital to see if their was any contraindications with the verapamil and MG. I did call and was told that their was a "caution" with the verapamil. I notified the patient of this. I explained I would review this with Dr. Graciela Husbands again to see if he wanted to pursue further drugs in this class or do we investigate another class against his MG. The pt is very knowledgeable about his disease and he will get some information to me in regards to this as well as helpful websites to check on. I will be in touch with him once I review with Dr. Graciela Husbands again. The pt did also state that he saw Dr. Pearlean Brownie today and his HR was 124 bpm there. I have set the pt to f/u with Dr. Graciela Husbands on 12/04/10 @ 3:00pm.

## 2010-10-30 NOTE — Telephone Encounter (Signed)
Will forward to Dr. Graciela Husbands to see if he can try to call pt again to explain this to him.

## 2010-10-30 NOTE — Consult Note (Signed)
NAME:  [name], POTTS NO.:  [id number]  MEDICAL RECORD NO.:  [id number]           PATIENT TYPE:  LOCATION:                                 FACILITY:  PHYSICIAN:  Pricilla Riffle, MD, FACCDATE OF BIRTH:  Nov 01, 1945  DATE OF CONSULTATION:  10/02/2010 DATE OF DISCHARGE:                                CONSULTATION   IDENTIFICATION:  The patient is a 65 year old who we are asked to see in preop.  He is due for a thymectomy tomorrow.  HISTORY OF PRESENT ILLNESS:  The patient has a history of hypertension, orthostatic hypotension.  He also has history of sinus tachycardia.  He was admitted with exacerbation of his myasthenia gravis, unresponsive to increased plasmapheresis.  He is scheduled for a thymectomy tomorrow.  He notes over the past month he has had a chronic band-like sensation across his chest, it does not very with activity, not pleuritic.  He feels that the plasmapheresis actually made the symptoms worse.  ALLERGIES:  The patient notes intolerances to D-PENICILLAMINE, ALPHA INTERFERON - neuromuscular block, QUININE, QUINIDINE, PROCAINAMIDE, AMINOGLYCOSIDES, and CIPROFLOXACIN.  Also had problems with DILTIAZEM.  CURRENT MEDICATIONS:  Lovenox 40 daily, Dulcolax, insulin sliding scale, Mestinon 90, vitamin B12, prednisone 20 b.i.d., metformin 1 g b.i.d., Glucotrol 10 b.i.d., and benazepril 40.  PAST MEDICAL HISTORY: 1. Hypertension. 2. Myasthenia gravis. 3. Diabetes. 4. Orthostatic hypotension. 5. Sinus tachycardia. 6. Chest pain. 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 9. Dyslipidemia. 10.Hyperbilirubinemia. 11.Cervical spondylosis. 12.COPD.  Note, echocardiogram done in February 2011, LVEF was 55-60%, grade 2 diastolic dysfunction, moderate LVH. Catheterization in 2006 showed normal coronary arteries.  SOCIAL HISTORY:  The patient is widowed.  He is a retired Surveyor, minerals. Quit tobacco in 1993 after a 60-pack-year,  does not drink.  FAMILY HISTORY:  Significant for breast cancer in mother, father died of back sarcoid.  REVIEW OF SYSTEMS:  All systems reviewed and negative to the above problem except as noted above.  PHYSICAL EXAMINATION:  GENERAL:  The patient is in no distress at rest. VITAL SIGNS:  Blood pressure 140s to 170s over 90s, pulse is 80s to 110s, temperature is 97, O2 sat on room air is 95%.  On telemetry, pulses range from 110-150 with an average in the 115 range.  The patient denies chest pain. HEENT:  Normocephalic, atraumatic.  PERRL. NECK:  JVP is normal.  No bruits. LUNGS:  Clear to auscultation. CARDIAC:  Regular rate and rhythm.  S1 and S2.  No S3, no murmurs. ABDOMEN:  Supple, nontender.  No hepatomegaly. EXTREMITIES:  A 1-2 plus edema bilaterally. NEUROLOGIC:  Deferred.  Alert and oriented x3.  Otherwise deferred.  Chest x-ray shows no CHF.  Dialysis catheter. A 12-lead EKG, sinus tachycardia, 119 beats per minute.  Right bundle- branch block, slight ST depression in leads II, III, F, old).  LABORATORY DATA:  Significant for a hemoglobin of 13.6, BUN and creatinine of 7 and 0.66, potassium 4.2.  TSH in February 2012 was normal.  IMPRESSION:  The patient is a 65 year old with history of myasthenia gravis, scheduled for a thymectomy tomorrow,  asked to follow along during this perioperative period.  He has no history of "coronary artery disease," in fact, he had a normal left ventricular ejection fraction in February 2011.  He presented with persistent chest tightness, probably more musculoskeletal. On examination, he has pedal edema.  EKG is significant for sinus tachycardia with some ST depression inferiorly. Recommendations: The patient should tolerate surgery from a cardiac standpoint and hopefully sinus tachycardia will improve.  Date of her beta-blockers and worsening of myasthenia gravis appears mixed and would follow.  May not necessarily need treatment.   Anesthesia will need to be careful and review agents as well. We will get echocardiogram today to reconfirm LVEF. 1. Hypertension.  Continue to follow.  Not optimally controlled at     present.     Pricilla Riffle, MD, [facility]     PVR/MEDQ  D:  10/03/2010  T:  10/03/2010  Job:  [id number]  Electronically Signed by Dietrich Pates MD [facility] on 10/30/2010 12:55:36 PM

## 2010-10-31 ENCOUNTER — Encounter: Payer: Medicare Other | Admitting: Internal Medicine

## 2010-10-31 ENCOUNTER — Ambulatory Visit (INDEPENDENT_AMBULATORY_CARE_PROVIDER_SITE_OTHER): Payer: Medicare Other | Admitting: Internal Medicine

## 2010-10-31 ENCOUNTER — Encounter: Payer: Self-pay | Admitting: Internal Medicine

## 2010-10-31 VITALS — BP 141/68 | HR 116 | Temp 98.0°F | Ht 67.0 in | Wt 210.7 lb

## 2010-10-31 DIAGNOSIS — E119 Type 2 diabetes mellitus without complications: Secondary | ICD-10-CM

## 2010-10-31 DIAGNOSIS — E785 Hyperlipidemia, unspecified: Secondary | ICD-10-CM

## 2010-10-31 DIAGNOSIS — G4700 Insomnia, unspecified: Secondary | ICD-10-CM

## 2010-10-31 DIAGNOSIS — I10 Essential (primary) hypertension: Secondary | ICD-10-CM

## 2010-10-31 MED ORDER — ZOLPIDEM TARTRATE 10 MG PO TABS: 10.0000 mg | ORAL_TABLET | Freq: Every evening | ORAL | Status: DC | PRN

## 2010-10-31 NOTE — Patient Instructions (Addendum)
You can use Zyrtec (cetirizine) 10 mg daily if you think you are having allergies.  You can continue working on your diet changes and working to exercise as much as you can to help control your diabetes as well as help.  Continue taking all your other medications as prescribed.  Follow up with me in 3 months.  Make sure you bring your medicines and your meter.

## 2010-10-31 NOTE — Progress Notes (Signed)
  Subjective:    Patient ID: [name], male    DOB: 1945-06-18, 65 y.o.   MRN: [id number]  HPI  [name] is a 65 year old man who presents to clinic today for routine follow up of his chronic medical problems including diabetes, hypertension, and hyperlipidemia.  He was recently diagnosed with Myasthenia gravis and had a thymectomy and as been doing well since then.  He was seen by Dr. Pearlean Brownie and was tapered down off his prednisone and was told that if he has symptoms of his myasthenia then to take his pyridostigmine as needed.  He has been only having to take it 2-3 times daily.  He has noticed that when the medication wears off he has a problem with clear nasal drainage for about an hour.   He has been taking his blood sugar only once daily or so and states that "my body doesn't function below 250."  He has been taking his metformin and glipizide if his blood sugars are higher then 300.  He does state that his mouth is very dry and that he has to urinate often during the day and at night.  He is drinking a lot of water to keep up because of the constant dry feeling in mouth.    Review of Systems    Constitutional: Denies fever, chills, diaphoresis, appetite change and fatigue.  HEENT: Denies photophobia, eye pain, redness, hearing loss, ear pain, congestion, sore throat, rhinorrhea, sneezing, mouth sores, trouble swallowing, neck pain, neck stiffness and tinnitus.   Respiratory: Denies SOB, DOE, cough, chest tightness,  and wheezing.   Cardiovascular: Denies chest pain, palpitations and leg swelling.  Gastrointestinal: Denies nausea, vomiting, abdominal pain, diarrhea, constipation, blood in stool and abdominal distention.  Genitourinary: Denies dysuria, urgency, frequency, hematuria, flank pain and difficulty urinating.  Musculoskeletal: Denies myalgias, back pain, joint swelling, arthralgias and gait problem.  Skin: Denies pallor, rash and wound.  Neurological: Denies dizziness, seizures,  syncope, weakness, light-headedness, numbness and headaches.  Hematological: Denies adenopathy. Easy bruising, personal or family bleeding history  Psychiatric/Behavioral: Denies suicidal ideation, mood changes, confusion, nervousness, sleep disturbance and agitation  Objective:   Physical Exam    Constitutional: Vital signs reviewed.  Patient is a well-developed and well-nourished man in no acute distress and cooperative with exam. Alert and oriented x3.  Head: Normocephalic and atraumatic Ear: TM normal bilaterally Mouth: no erythema or exudates, MMM Eyes: PERRL, EOMI, conjunctivae normal, No scleral icterus.  Neck: Supple, Trachea midline normal ROM, No JVD, mass, thyromegaly, or carotid bruit present.  Cardiovascular: RRR, S1 normal, S2 normal, no MRG, pulses symmetric and intact bilaterally Pulmonary/Chest: CTAB, no wheezes, rales, or rhonchi Abdominal: Soft. Non-tender, non-distended, bowel sounds are normal, no masses, organomegaly, or guarding present.  GU: no CVA tenderness Musculoskeletal: No joint deformities, erythema, or stiffness, ROM full and no nontender Hematology: no cervical, inginal, or axillary adenopathy.  Neurological: A&O x3, Strength is normal and symmetric bilaterally, cranial nerve II-XII are grossly intact, no focal motor deficit, sensory intact to light touch bilaterally.  Skin: Warm, dry and intact. No rash, cyanosis, or clubbing.  Psychiatric: Normal mood and affect. speech and behavior is normal. Judgment and thought content normal. Cognition and memory are normal.   Assessment & Plan:

## 2010-10-31 NOTE — Telephone Encounter (Signed)
Tried to call pt but only had a beeper number steve

## 2010-11-08 NOTE — Assessment & Plan Note (Signed)
Lab Results  Component Value Date   HGBA1C 10.3 04/28/2010   HGBA1C 10.4 10/29/2009   HGBA1C 10.2* 07/09/2009   Lab Results  Component Value Date   MICROALBUR 8.09* 10/29/2009   LDLCALC 100* 10/11/2006   CREATININE 0.67 10/05/2010   His blood sugar has been poorly controlled for sometime.  He refused to let us do a HgBA1C today stating that "I know it will be high because I have been on prednisone which makes it high."  He would like to wait until he is off the prednisone and then recheck it.  He states that when he was hospitalized and they brought his blood sugars down to 78 he was dizzy, shaky, and nauseated.  We discussed the reason for good long term control of his blood sugar and he agreed to work and dropping his blood sugar slowly over time and seeing how he feels.    He will be difficult to control because of his own opinions of how his medical conditions should be managed.

## 2010-11-08 NOTE — Assessment & Plan Note (Signed)
BP Readings from Last 3 Encounters:  10/31/10 141/68  10/22/10 124/66  06/18/10 150/90   His blood pressure is mildly elevated today above his goal of <130/80.  He states that he is taking his medications as prescribed.  He was better when seen at his cardiologist office so we will monitor for now and adjust his regimen as needed.

## 2010-11-08 NOTE — Assessment & Plan Note (Signed)
Lab Results  Component Value Date   CHOL 190 07/09/2009   CHOL 207* 10/11/2006   Lab Results  Component Value Date   HDL 39.70 07/09/2009   HDL 35* 10/11/2006   Lab Results  Component Value Date   LDLCALC 100* 10/11/2006   Lab Results  Component Value Date   TRIG 355.0* 07/09/2009   TRIG 362* 10/11/2006   Lab Results  Component Value Date   CHOLHDL 5 07/09/2009   CHOLHDL 5.9 Ratio 10/11/2006   Lab Results  Component Value Date   LDLDIRECT 96.5 07/09/2009   HIs last cholesterol test was over a year ago.  He states that he has changed his diet since being on the prednisone.  He does not wish to have his blood drawn today stating "I don't want to get poked today."  We will put this off until his follow up when we recheck his A1C.

## 2010-12-04 ENCOUNTER — Ambulatory Visit (INDEPENDENT_AMBULATORY_CARE_PROVIDER_SITE_OTHER): Payer: Medicare Other | Admitting: Internal Medicine

## 2010-12-04 ENCOUNTER — Encounter: Payer: Self-pay | Admitting: Internal Medicine

## 2010-12-04 VITALS — BP 134/78 | HR 117 | Resp 12 | Ht 67.0 in | Wt 213.0 lb

## 2010-12-04 DIAGNOSIS — I10 Essential (primary) hypertension: Secondary | ICD-10-CM

## 2010-12-04 DIAGNOSIS — G7000 Myasthenia gravis without (acute) exacerbation: Secondary | ICD-10-CM

## 2010-12-04 DIAGNOSIS — R000 Tachycardia, unspecified: Secondary | ICD-10-CM

## 2010-12-04 DIAGNOSIS — I498 Other specified cardiac arrhythmias: Secondary | ICD-10-CM

## 2010-12-04 DIAGNOSIS — R609 Edema, unspecified: Secondary | ICD-10-CM

## 2010-12-04 DIAGNOSIS — R600 Localized edema: Secondary | ICD-10-CM | POA: Insufficient documentation

## 2010-12-04 DIAGNOSIS — I452 Bifascicular block: Secondary | ICD-10-CM

## 2010-12-04 MED ORDER — FUROSEMIDE 20 MG PO TABS: ORAL_TABLET | ORAL | Status: DC

## 2010-12-04 NOTE — Assessment & Plan Note (Addendum)
Likely multifactorial. He is having a salt craving for some reason. He does not have evidence of a elevated JVP. I am inclined to put him on low-dose diuretic given the 20 pounds of weight as well as having suggested a salt substitute.  We have looked at 3 lists; on none of them is diuretics noted. It is listed on the up-to-date list.  I've spoken with Dr. Meredith Pel from internal medicine to address his salt craving and how could be diagnosed potentially in the context of prednisone therapy. He suggested consultation with endocrinology.

## 2010-12-04 NOTE — Patient Instructions (Signed)
Your physician has recommended you make the following change in your medication:  1) Start furosemide (lasix) 20mg  one tablet by mouth every other day. 2) Please ask Dr. Pearlean Brownie about using verapamil in relation to your myasthenia gravis.  Your physician wants you to follow-up in: 6 months. You will receive a reminder letter in the mail two months in advance. If you don't receive a letter, please call our office to schedule the follow-up appointment.

## 2010-12-04 NOTE — Progress Notes (Signed)
HPI  [name] is a 65 y.o. male Seen in followup for sinus tachycardia in the setting of normal left ventricular function and unrevealing catheterization. No underlying diagnosis has been made.  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 This is this is due to some improvement.  He continues to have problems however with fatigue and exercise tolerance. He has had significant peripheral edema and indeed has a 15-20 pound weight gain since we saw him in January.  Past Medical History  Diagnosis Date  . Sinus tachycardia 2006    Cardiac Cath unremarkable  . Right bundle branch block and left posterior fascicular block   . Orthostatic hypotension   . Unspecified essential hypertension   . Hyperlipidemia   . DM (diabetes mellitus)   . Routine health maintenance   . ADD (attention deficit disorder)   . Decreased libido   . Hyperbilirubinemia   . Cervical spondylosis with radiculopathy   . Depression   . Syrinx     T5-T6  . Cord compression     Cord compression syndrome C5-C6, C6-C7    Past Surgical History  Procedure Date  . Thymectomy april 2012    Current Outpatient Prescriptions  Medication Sig Dispense Refill  . B Complex-C (B-COMPLEX WITH VITAMIN C) tablet Take 1 tablet by mouth daily.        . benazepril (LOTENSIN) 40 MG tablet Take 1 tablet (40 mg total) by mouth daily. For blood pressure  30 tablet  3  . Bismuth Subsalicylate (PEPTO-BISMOL MAX STRENGTH) 525 MG/15ML SUSP Take by mouth as directed.        . Cholecalciferol (VITAMIN D-3 PO) Take by mouth. 10000 units once daily      . Cyanocobalamin (VITAMIN B 12 PO) Take by mouth.        Marland Kitchen glipiZIDE (GLUCOTROL) 10 MG tablet Take 1 tablet (10 mg total) by mouth 2 (two) times daily before a meal. For diabetes.  60 tablet  1  . glucose blood (BLOOD GLUCOSE TEST STRIPS) test strip Use as instructed Freestyle lite test strips      . Insulin Pen Needle (PEN NEEDLES)  31G X 6 MM MISC Use as instructed      . Lancets (FREESTYLE) lancets Use as instructed      . metFORMIN (GLUCOPHAGE) 1000 MG tablet Take 1,000 mg by mouth 2 (two) times daily.        . predniSONE (DELTASONE) 20 MG tablet Take 20 mg by mouth daily.       Marland Kitchen pyridostigmine (MESTINON) 60 MG tablet Take 60 mg by mouth as directed. Up to 5 times daily as needed.      Marland Kitchen VITAMIN A PO Take by mouth daily.        Marland Kitchen VITAMIN E PO Take by mouth daily.        Marland Kitchen zolpidem (AMBIEN) 10 MG tablet Take 1 tablet (10 mg total) by mouth at bedtime as needed. Insomnia   30 tablet  4  . Zolpidem Tartrate (AMBIEN PO) Take by mouth.          Allergies  Allergen Reactions  . Beta Adrenergic Blockers     REACTION: pt has MG  . Calcium Channel Blockers     REACTION: pt has MG    Review of Systems negative except from HPI and PMH  Physical Exam Well developed and well nourished in no acute distress HENT normal E scleral and icterus clear Neck Supple  JVP 5-6 cm; carotids brisk and full Clear to ausculation Regular rate and rhythm, no murmurs gallops or rub Soft with active bowel sounds No clubbing cyanosisClinical 2-3+ peripheral edemaAlert and oriented, grossly normal motor and sensory function Skin Warm and Dry  ECG Sinus tachycardia at 117 Intervals 0.13/14/35 Right bundle branch block with left posterior fascicular block Axis XCV  Assessment and  Plan

## 2010-12-04 NOTE — Assessment & Plan Note (Signed)
Stable. In the absence of syncope nothing else need be done

## 2010-12-04 NOTE — Assessment & Plan Note (Signed)
He has persistent sinus tachycardia now 4-5 years duration. We will take a somewhat slow and cautious approach. He did not benefit from diltiazem and could not tolerate metoprolol. My inclination Would be to try a L-type channel calcium blocker i.e. Verapamil.  We will await approval from neurology

## 2010-12-04 NOTE — Assessment & Plan Note (Signed)
Stable

## 2010-12-04 NOTE — Assessment & Plan Note (Signed)
We have spent more than 45 minutes looking and medications and interactions with the myasthenia

## 2010-12-11 ENCOUNTER — Telehealth: Payer: Self-pay | Admitting: *Deleted

## 2010-12-11 NOTE — Telephone Encounter (Signed)
The patient called earlier today stating that he took the lasix dose for the first time today. He states he would like to know if Dr. Graciela Husbands has received any information on any testing that can be done for his salt craving. He also reports he is still dizzy and wanted to let Dr. Graciela Husbands know about that- is there any relation to the salt craving? He was also following up on the verapamil. He wanted to know if there was a caution on this against his MG. I explained to him, as before, that I did call the pharmacy at the hospital and they did have a caution with this. He wanted to know if Dr. Graciela Husbands had any other med options for him. I explained he should review the verapamil with Dr. Pearlean Brownie at his f/u appt this month and also ask him about other alternatives in relation to his MG, but I will bring this to Dr. Odessa Fleming attention. He will call me back tomorrow since his phone is not working right.

## 2010-12-12 NOTE — Telephone Encounter (Signed)
Endocrinologist says his prednisone dose was adequate to protect from the salt craving issues that we discussed.  i also spoke with an internist who had no furhter recommendatoins We will wait to hear what Dr Pearlean Brownie says about verapamil

## 2010-12-12 NOTE — Telephone Encounter (Signed)
The patient called back today. I explained Dr. Odessa Fleming recommendations to him. He is still quite concerned about the salt cravings and states the one dose of lasix did not do anything for him. I recommended daily weight checks, to continue with his current dose of lasix since he only takes it every other day (we need to be careful with his MG), and to f/u with Dr. Pearlean Brownie as scheduled. He should address verapamil and the salt cravings with Dr. Pearlean Brownie. I explained to the patient that we are in a difficult place between his MG, his salt cravings, his tachycardia, and his fluid status. I made him aware that he may notice very little difference in the fluid status as long as he craves and is eating salt. He has been instructed to call me back after he sees Dr. Pearlean Brownie. He voices understanding.

## 2010-12-16 ENCOUNTER — Telehealth: Payer: Self-pay | Admitting: *Deleted

## 2010-12-16 NOTE — Telephone Encounter (Signed)
The patient called today stating his lasix is not working for his fluid retention. He still has questions about his medications and who Dr. Graciela Husbands spoke with about his prednisone. I will ask Dr. Graciela Husbands to call this patient.

## 2010-12-23 NOTE — Telephone Encounter (Signed)
Lm  sjk

## 2010-12-25 ENCOUNTER — Telehealth: Payer: Self-pay | Admitting: Internal Medicine

## 2010-12-25 NOTE — Telephone Encounter (Signed)
Per pt call, returning call to Dr. Graciela Husbands and/or nurse. Pt said he and neurologists were going to discuss a medication. Please return pt call to advise and/or discuss.

## 2010-12-25 NOTE — Telephone Encounter (Signed)
Patient called back, he states the Lasix and prednisone is not a problem now. He called  because of the salt craving that he is having. Pt said  it is a disease that is causing it. He would like to know what kind of test he needs to have done and where,so  he can be diagnosed. Also he would like to know if Dr. Graciela Husbands has spoken with the Neurologist that treats his" Mastinus Gravis" regarding this problem. For better treatment, patient  would like to be referred to a Neurologist in [facility], Dr. Dimas Aguas.

## 2010-12-25 NOTE — Telephone Encounter (Signed)
Returning call back to nurse.  

## 2010-12-25 NOTE — Telephone Encounter (Signed)
RN attempted to call x 3: line busy.

## 2010-12-26 NOTE — Telephone Encounter (Signed)
I spoke with the patient. He will seek an appointment with Dr. Dimas Aguas at [facility]. We reviewed the fact that he has left atrial enlargement. I refer reviewed that I had spoken with the endocrinologist about his salt craving and the observation was made that his prednisone should be sufficient to exclude any kind of Mineralocorticoid deficiency. As relates to beta blockers and her calcium blockers, the prohibitive lists are not uniform in this. I told him I don't have the expertise to do to get this. He will need guidance from her neurologist with myasthenia gravis .f. We look forward to getting at from Dr. Dimas Aguas

## 2010-12-26 NOTE — Telephone Encounter (Signed)
Dr. Graciela Husbands called and spoke with the patient today.

## 2010-12-29 ENCOUNTER — Other Ambulatory Visit: Payer: Self-pay | Admitting: Internal Medicine

## 2011-01-12 ENCOUNTER — Other Ambulatory Visit: Payer: Self-pay | Admitting: *Deleted

## 2011-01-13 MED ORDER — METFORMIN HCL 1000 MG PO TABS: 1000.0000 mg | ORAL_TABLET | Freq: Two times a day (BID) | ORAL | Status: DC

## 2011-01-14 ENCOUNTER — Telehealth: Payer: Self-pay | Admitting: *Deleted

## 2011-01-14 NOTE — Telephone Encounter (Signed)
RX refill request for Freestyle test strips faxed to our office from Dr. Gilford Rile. I left a message with the pharmacy that we do not prescribe these for the patient and he will need to get these through his PCP.

## 2011-01-22 ENCOUNTER — Ambulatory Visit: Payer: Medicare Other | Admitting: Cardiothoracic Surgery

## 2011-01-22 ENCOUNTER — Ambulatory Visit (INDEPENDENT_AMBULATORY_CARE_PROVIDER_SITE_OTHER): Payer: Medicare Other | Admitting: Cardiothoracic Surgery

## 2011-01-22 ENCOUNTER — Other Ambulatory Visit: Payer: Self-pay

## 2011-01-22 DIAGNOSIS — G733 Myasthenic syndromes in other diseases classified elsewhere: Secondary | ICD-10-CM

## 2011-01-26 NOTE — Assessment & Plan Note (Addendum)
OFFICE VISIT  [name], [name] DOB:  [birth year]                                        January 26, 2011 CHART #:  [id number]  The patient returns to the office today in followup after his total thymectomy by partial sternal split done October 03, 2010.  The patient comes in today for a followup visit doing relatively well.  He has noted marked improvement in his myasthenia symptoms.  Though since he is last seen, he does note that when he manipulates his prednisone doses he has had some weakness and droop of the right eyelid and some leg weakness. Though he seems to be doing well, he denies any difficulty chewing.  On reviewing his medications with him, it is difficult to know exactly what he is taking as he appears to change the dose around especially of the prednisone.  Currently, he says he has taken vitamin B, Lotensin 40 mg a day, Pepto-Bismol p.r.n., vitamin D, vitamin B, Lasix 20 mg every other day, Glucotrol 10 mg twice a day, metformin 1000 mg twice a day and prednisone 10 mg a day.  He has a prescription for Mestinon, but it is unclear how much he is actually taking.  Also, vitamin A, vitamin E.  In addition, he notes that he takes over-the-counter magnesium.  On physical exam; he does have some droop in his right eyelid.  Blood pressure is 178/91.  He notes he did not take his blood pressure medicine today, heart rate is 115, respiratory rate is 16, and O2 sats 94%.  Overall, I am pleased with his progress, though he does appear to continue to manipulate his prednisone dosages.  I have warned him against this to stay in contact with Dr. Pearlean Brownie about his medicine doses. In addition, the patient refuses to make any effort of keeping his glucose anywhere close to 140.  He notes that he says he feels bad whenever it is less than 300.  He is seen in the [facility], but is unsure what his doctor's name is.  I have encouraged him to  continue to follow up with Dr. Pearlean Brownie and also to see his medical doctor in the [facility] to work on control of his hypertension and diabetes.  I have not made him a return appointment in the thoracic surgery office, but would be glad to see him at Dr. Pearlean Brownie or the patient's request at anytime.  Sheliah Plane, MD Electronically Signed  EG/MEDQ  D:  01/26/2011  T:  01/26/2011  Job:  [id number]  cc:   Pramod P. Pearlean Brownie, MD [facility]

## 2011-01-27 ENCOUNTER — Telehealth: Payer: Self-pay | Admitting: *Deleted

## 2011-01-27 DIAGNOSIS — E785 Hyperlipidemia, unspecified: Secondary | ICD-10-CM

## 2011-01-27 NOTE — Telephone Encounter (Signed)
Pt calls and states he would like a cbc and cmp, he states his eye is giving him trouble, arms and hands cramping, generalized edema and craving of salt. He was advised that he would need to be seen, appt set for Friday 8/24 at 1545

## 2011-01-28 NOTE — Telephone Encounter (Signed)
Pt called again today and would like lab test done BEFORE he is seen on Friday.  Will you order?

## 2011-01-28 NOTE — Telephone Encounter (Signed)
Pt is happy to come in tomorrow, fasting.  He would also like a PSA, and T3 & T4 and TSH. Will you order these, please?

## 2011-01-28 NOTE — Telephone Encounter (Signed)
He has no reason to need a TSH other thyroid testing.  He has never been hypothyroid and unless he is having symptoms consistent with hypo or hyperthyroidism there is no need.    PSA is another story.  The new recommendations recommend against screening for prostate cancer using PSA etc. Unless he has symptoms of blood in his urine, trouble urinating, or new severe low back pain.

## 2011-01-28 NOTE — Telephone Encounter (Signed)
He is in need of some follow up labs but last time in clinic didn't want to get his blood drawn.  I will do the cmet if he is also willing to come in fasting to check his cholesterol as well as his HgBA1C. Unless he has had problems with bleeding or anything like that he has no reason for a CBC since his last test was in April 2012 and his HgB was 12.0 which is fine.    So if he wants to get it done before his appointment I'm fine with that.  Have him come in fasting tomorrow and have the blood drawn which should give it enough time to get done before he sees me on Friday.

## 2011-01-29 ENCOUNTER — Other Ambulatory Visit (INDEPENDENT_AMBULATORY_CARE_PROVIDER_SITE_OTHER): Payer: Medicare Other

## 2011-01-29 DIAGNOSIS — E785 Hyperlipidemia, unspecified: Secondary | ICD-10-CM

## 2011-01-29 LAB — LIPID PANEL
HDL: 31 mg/dL — ABNORMAL LOW (ref >39)
Triglycerides: 767 mg/dL — ABNORMAL HIGH (ref <150)

## 2011-01-29 LAB — COMPREHENSIVE METABOLIC PANEL
Albumin: 3.5 g/dL (ref 3.5–5.2)
CO2: 28 mEq/L (ref 19–32)
Glucose, Bld: 406 mg/dL — ABNORMAL HIGH (ref 70–99)
Sodium: 139 mEq/L (ref 135–145)
Total Bilirubin: 1.3 mg/dL — ABNORMAL HIGH (ref 0.3–1.2)
Total Protein: 5.7 g/dL — ABNORMAL LOW (ref 6.0–8.3)

## 2011-01-30 ENCOUNTER — Encounter: Payer: Medicare Other | Admitting: Internal Medicine

## 2011-01-30 LAB — HEMOGLOBIN A1C
Hgb A1c MFr Bld: 13.9 % — ABNORMAL HIGH (ref <5.7)
Mean Plasma Glucose: 352 mg/dL — ABNORMAL HIGH (ref <117)

## 2011-01-30 NOTE — Telephone Encounter (Signed)
Okay, pt to be seen today

## 2011-02-06 ENCOUNTER — Encounter: Payer: Medicare Other | Admitting: Internal Medicine

## 2011-02-13 ENCOUNTER — Encounter: Payer: Self-pay | Admitting: Internal Medicine

## 2011-02-13 ENCOUNTER — Ambulatory Visit (INDEPENDENT_AMBULATORY_CARE_PROVIDER_SITE_OTHER): Payer: Medicare Other | Admitting: Internal Medicine

## 2011-02-13 DIAGNOSIS — E119 Type 2 diabetes mellitus without complications: Secondary | ICD-10-CM

## 2011-02-13 DIAGNOSIS — G4700 Insomnia, unspecified: Secondary | ICD-10-CM

## 2011-02-13 DIAGNOSIS — R609 Edema, unspecified: Secondary | ICD-10-CM

## 2011-02-13 DIAGNOSIS — I10 Essential (primary) hypertension: Secondary | ICD-10-CM

## 2011-02-13 DIAGNOSIS — E785 Hyperlipidemia, unspecified: Secondary | ICD-10-CM

## 2011-02-13 LAB — GLUCOSE, CAPILLARY

## 2011-02-13 MED ORDER — BENAZEPRIL HCL 40 MG PO TABS: 40.0000 mg | ORAL_TABLET | Freq: Every day | ORAL | Status: DC

## 2011-02-13 MED ORDER — GLIPIZIDE 10 MG PO TABS: 10.0000 mg | ORAL_TABLET | Freq: Two times a day (BID) | ORAL | Status: DC

## 2011-02-13 NOTE — Patient Instructions (Addendum)
Start the Metformin 500 mg with your breakfast for 3-4 days then 500 mg with breakfast and supper for 3-4 days then 1000 with breakfast and 500 with supper for 3-4 days then 1000 mg with breakfast and supper.  Keep doing the Glipizide 10 mg twice daily.    Keep drinking plenty of water for the cramping.   Follow up with me on September 28th at 1:45 pm to see how you're doing.

## 2011-02-13 NOTE — Progress Notes (Signed)
Subjective:   Patient ID: [name] male   DOB: 10-Apr-1946 65 y.o.   MRN: [id number]  HPI: [name] is a 65 y.o. man who presents to clinic today for follow up of his chronic medical conditions including HTN, HLD, DM II, and myasthenia gravis.  He states that he has been having horizontal double vision again recently and was told by his neurologist to increase his prednisone back to 20 mg daily.  He has also been having to take his Pyridostigmine 4-5 times daily.    He also states that he has bene having problems with increased swelling, weight gain, and cramping in his hands and feet for the last 2 months.  He also states that he has a constant craving for salt, always thirsty, and urinating all the time.  He states that he checks his sugars only if his feet start to tingle and will only take his metformin if his blood sugar is over 350.  He has not been taking the glipizide at all it appears.    He has several other requests today.  He has been reading on the internet how people with Myasthenia gravis have a higher rate of thyroid problems and want his thyroid checked.  He is fatigued all the time but denies any thinning of his hair or nails, depression, dry skin, or growth in his neck.  He also states that he would like to have his PSA checked because he is concerned he has prostate cancer.  We discussed the current recommendations and how they recommend against testing for prostate cancer with PSA in a person without symptoms.  He denies any blood in his urine, pain with urination, or urinary hesitancy.    Past Medical History  Diagnosis Date  . Sinus tachycardia 2006    Cardiac Cath unremarkable  . Right bundle branch block and left posterior fascicular block   . Orthostatic hypotension   . Unspecified essential hypertension   . Hyperlipidemia   . DM (diabetes mellitus)   . ADD (attention deficit disorder)   . Decreased libido   . Myasthenia gravis     Status post thymectomy  April 2012  . Cervical spondylosis with radiculopathy   . Depression   . Syrinx     T5-T6  . Cord compression     Cord compression syndrome C5-C6, C6-C7   Current Outpatient Prescriptions  Medication Sig Dispense Refill  . B Complex-C (B-COMPLEX WITH VITAMIN C) tablet Take 1 tablet by mouth daily.        . benazepril (LOTENSIN) 40 MG tablet Take 1 tablet (40 mg total) by mouth daily. For blood pressure  30 tablet  3  . Bismuth Subsalicylate (PEPTO-BISMOL MAX STRENGTH) 525 MG/15ML SUSP Take by mouth as directed.        . Cholecalciferol (VITAMIN D-3 PO) Take by mouth. 10000 units once daily      . Cyanocobalamin (VITAMIN B 12 PO) Take by mouth.        Marland Kitchen FREESTYLE LITE test strip USE TO TEST BLOOD SUGAR UP TO 3X A DAY FOR THE NEXT WEEK, THEN DECREASE TO UP TO 1X/DAY (250.00)  100 each  10  . furosemide (LASIX) 20 MG tablet Take one tablet by mouth every other day.  30 tablet  6  . glipiZIDE (GLUCOTROL) 10 MG tablet Take 1 tablet (10 mg total) by mouth 2 (two) times daily before a meal. For diabetes.  60 tablet  1  . Insulin Pen Needle (  PEN NEEDLES) 31G X 6 MM MISC Use as instructed      . Lancets (FREESTYLE) lancets Use as instructed      . metFORMIN (GLUCOPHAGE) 1000 MG tablet Take 1 tablet (1,000 mg total) by mouth 2 (two) times daily.  180 tablet  2  . predniSONE (DELTASONE) 20 MG tablet Take 20 mg by mouth daily.       Marland Kitchen pyridostigmine (MESTINON) 60 MG tablet Take 60 mg by mouth as directed. Up to 5 times daily as needed.      Marland Kitchen VITAMIN A PO Take by mouth daily.        Marland Kitchen VITAMIN E PO Take by mouth daily.        Marland Kitchen zolpidem (AMBIEN) 10 MG tablet Take 1 tablet (10 mg total) by mouth at bedtime as needed. Insomnia   30 tablet  4  . Zolpidem Tartrate (AMBIEN PO) Take by mouth.         Family History  Problem Relation Age of Onset  . Breast cancer Mother   . Other Father     Beck's Sarcoid   History   Social History  . Marital Status: Single    Spouse Name: N/A    Number of  Children: N/A  . Years of Education: N/A   Occupational History  . Retired     Surveyor, minerals   Social History Main Topics  . Smoking status: Former Smoker -- 2.5 packs/day for 30 years    Quit date: 06/09/1991  . Smokeless tobacco: Never Used  . Alcohol Use: No  . Drug Use: No  . Sexually Active: None   Other Topics Concern  . None   Social History Narrative   Has a single man's lifestyle and will remind anyone of it when asked.Prefers to see only male physicians   Review of Systems: Negative except as noted in the HPI  Objective:  Physical Exam: Filed Vitals:   02/13/11 1614  BP: 162/95  Pulse: 117  Temp: 98 F (36.7 C)  TempSrc: Oral  Height: 5\' 7"  (1.702 m)  Weight: 215 lb 1.6 oz (97.569 kg)   Constitutional: Vital signs reviewed.  Patient is a well-developed and well-nourished man in no acute distress and cooperative with exam. Alert and oriented x3.  Head: Normocephalic and atraumatic Ear: TM normal bilaterally Mouth: no erythema or exudates, dry mucus membranes. Eyes: mild strabismus in the right eye noted, PERRL, EOMI, conjunctivae normal, No scleral icterus.  Neck: Supple, Trachea midline normal ROM, No JVD, mass, thyromegaly, or carotid bruit present.  Cardiovascular: RRR, S1 normal, S2 normal, no MRG, pulses symmetric and intact bilaterally Pulmonary/Chest: CTAB, no wheezes, rales, or rhonchi Abdominal: Soft, obese, non-tender, non-distended, bowel sounds are normal, no masses, organomegaly, or guarding present.  GU: no CVA tenderness Musculoskeletal: No joint deformities, erythema, or stiffness, ROM full and no nontender Hematology: no cervical, inginal, or axillary adenopathy.  Neurological: A&O x3, Strength is normal and symmetric bilaterally, cranial nerve II-XII are grossly intact except as noted in the HEENT exam, no focal motor deficit, sensory intact to light touch bilaterally.  Skin: Warm, dry and intact. No rash, cyanosis, or clubbing.  Psychiatric:  Normal mood and affect. speech and behavior is normal. Judgment and thought content normal. Cognition and memory are normal.   Assessment & Plan:

## 2011-02-17 ENCOUNTER — Other Ambulatory Visit (INDEPENDENT_AMBULATORY_CARE_PROVIDER_SITE_OTHER): Payer: Medicare Other

## 2011-02-17 DIAGNOSIS — G4700 Insomnia, unspecified: Secondary | ICD-10-CM

## 2011-02-17 DIAGNOSIS — R609 Edema, unspecified: Secondary | ICD-10-CM

## 2011-02-19 ENCOUNTER — Telehealth: Payer: Self-pay | Admitting: Internal Medicine

## 2011-02-19 NOTE — Assessment & Plan Note (Signed)
Lab Results  Component Value Date   HGBA1C 13.9* 01/29/2011   HGBA1C 10.3 04/28/2010   HGBA1C 10.4 10/29/2009   Lab Results  Component Value Date   MICROALBUR 8.09* 10/29/2009   LDLCALC Comment:   Not calculated due to Triglyceride >400. Suggest ordering Direct LDL (Unit Code: 16109).   Total Cholesterol/HDL Ratio:CHD Risk                        Coronary Heart Disease Risk Table                                        Men       Women          1/2 Average Risk              3.4        3.3              Average Risk              5.0        4.4           2X Average Risk              9.6        7.1           3X Average Risk             23.4       11.0 Use the calculated Patient Ratio above and the CHD Risk table  to determine the patient's CHD Risk. ATP III Classification (LDL):       < 100        mg/dL         Optimal      604 - 129     mg/dL         Near or Above Optimal      130 - 159     mg/dL         Borderline High      160 - 189     mg/dL         High       > 540        mg/dL         Very High   9/81/1914   CREATININE 0.78 01/29/2011   Basic Metabolic Panel:    Component Value Date/Time   NA 139 01/29/2011 0827   K 4.1 01/29/2011 0827   CL 101 01/29/2011 0827   CO2 28 01/29/2011 0827   BUN 12 01/29/2011 0827   CREATININE 0.78 01/29/2011 0827   CREATININE 0.67 10/05/2010 0430   GLUCOSE 406* 01/29/2011 0827   CALCIUM 9.3 01/29/2011 0827    His A1C is exteremly poorly controlled likely secondary to medication non-compliance.  He is unwilling to consider insulin and will tell anyone who listens that it causes cancer.  We had a long discussion of >25 min about glucose control and the side effects of poor glycemic control that he exhibits including polyuria, polydipsia, dehydration, swelling, and fatigue.  He has the common GI side effects when he takes his metformin but because he takes it PRN his body hasn't gotten used to it.  We discussed that usually the GI side effects decrease with consistent  compliance.  We will try to have him slowly increase his  metformin again and also to take his glipizide as prescribed instead of PRN.  The goal, as I've told him several times, is consistent blood sugar control instead of PRN dosing.  He is in agreement at this time to try again.  We will see him back again in a few weeks to see how it is going.

## 2011-02-19 NOTE — Assessment & Plan Note (Addendum)
His peripheral edema is likely secondary to his autodiuresis from his poor glycemic control and salt retention to maintain volume.  He refused IV hydration today but with better control of his blood sugars should slowly help resolve that.  He wants to check his thyroid today.

## 2011-02-19 NOTE — Assessment & Plan Note (Addendum)
BP Readings from Last 3 Encounters:  02/13/11 162/95  12/04/10 134/78  10/31/10 141/68   He is hypertensive and tachycardic today which is likely due to his volume contraction from his poor glycemic control.  We talked about having to increase his water intake to keep up with the urinary losses.  He refused IV hydration today.  We will continue to monitor him and this should improve with his increased diabetic medication compliance.

## 2011-02-19 NOTE — Assessment & Plan Note (Signed)
  Lab Results  Component Value Date   CHOL 274* 01/29/2011   CHOL 190 07/09/2009   CHOL 207* 10/11/2006   Lab Results  Component Value Date   HDL 31* 01/29/2011   HDL 39.70 07/09/2009   HDL 35* 10/11/2006   Lab Results  Component Value Date   LDLCALC Comment:   Not calculated due to Triglyceride >400. Suggest ordering Direct LDL (Unit Code: 16109).   Total Cholesterol/HDL Ratio:CHD Risk                        Coronary Heart Disease Risk Table                                        Men       Women          1/2 Average Risk              3.4        3.3              Average Risk              5.0        4.4           2X Average Risk              9.6        7.1           3X Average Risk             23.4       11.0 Use the calculated Patient Ratio above and the CHD Risk table  to determine the patient's CHD Risk. ATP III Classification (LDL):       < 100        mg/dL         Optimal      604 - 129     mg/dL         Near or Above Optimal      130 - 159     mg/dL         Borderline High      160 - 189     mg/dL         High       > 540        mg/dL         Very High   9/81/1914   LDLCALC 100* 10/11/2006   Lab Results  Component Value Date   TRIG 767* 01/29/2011   TRIG 355.0* 07/09/2009   TRIG 362* 10/11/2006   Lab Results  Component Value Date   CHOLHDL 8.8 01/29/2011   CHOLHDL 5 07/09/2009   CHOLHDL 5.9 Ratio 10/11/2006   Lab Results  Component Value Date   LDLDIRECT 96.5 07/09/2009   His triglycerides preclude the LDL calculation and direct LDL was not added.  When asked he states that he was fasting.  He was either not or his triglycerides are elevated due to his dehydration.  We will continue to monitor since his last LDL was 96.5 which was below his goal of <100.

## 2011-02-19 NOTE — Assessment & Plan Note (Signed)
He continues to have problems with his insomnia but states that the [place] helps.  We will continue to monitor and refill his medication today.

## 2011-02-19 NOTE — Telephone Encounter (Signed)
Pt calling c/o not being able to stand up, feeling really dizzy, and double vision. Pt c/o symptoms for about 2 1/2 wks. Pt c/o extremely weak. Pt wanted to know if he could be seen today. I made pt aware that Dr. Graciela Husbands is out of the office all week. Pt wanted msg sent to nurse to discuss further. Please return pt call to advise.

## 2011-02-19 NOTE — Telephone Encounter (Signed)
LMTC

## 2011-02-20 NOTE — Telephone Encounter (Signed)
LMTC

## 2011-02-20 NOTE — Telephone Encounter (Signed)
I spoke with the patient. He states that Dr. Graciela Husbands was referring him to  Dr. Clarisa Kindred at [facility]- specialist for Myasthenia Gravis. He states he is getting worse as far as his tachycardia and SOB is concerned. He is uncertain as to how much is related to his Myasthenia Gravis. He states that for the last 2 weeks. He is getting very winded walking across the parking lot. He reports that neurology decreased his prednisone when this started, but they upped it again last week. He states he is getting records from [facility] and neurology to bring here so these can be sent along with his cardiology records to Dr. Dimas Aguas along with a note that he needs Dr. Graciela Husbands to do for him stating his condition and the difficulties he is experiencing now. He has given me phone #'s and a fax # for Dr. Jeannette How office. [phone]- fax, [phone]- Karen Kitchens Training and development officer). [phone]- Sharlene Dory, and another # [phone]. In talking with him, he is not certain what # is the best for me to call. I have added him on to see Dr. Graciela Husbands on 9/18 due to the complexity of his situation and per his request. He will bring his records and the best contact # for me to call. He reports being dizzy when he stands up. I asked if he felt like he needed to be evaluate in the ER even though his symptoms have been going on for 2 weeks. He states he has been on the verge of going a couple of times. I have advised if he feels worse in any way, that he should report to the ER and not wait until his f/u on Tuesday with Dr. Graciela Husbands. He voices understanding.

## 2011-02-23 ENCOUNTER — Telehealth: Payer: Self-pay | Admitting: Internal Medicine

## 2011-02-23 NOTE — Telephone Encounter (Signed)
I spoke with the patient and he is aware that his appointment is at 1:00pm tomorrow.

## 2011-02-23 NOTE — Telephone Encounter (Signed)
Pt calling to double check pt appt time tomorrow. Pt thought appt was first thing in the morning at 9am. Please return pt call to discuss further.

## 2011-02-24 ENCOUNTER — Ambulatory Visit (INDEPENDENT_AMBULATORY_CARE_PROVIDER_SITE_OTHER): Payer: Medicare Other | Admitting: Internal Medicine

## 2011-02-24 VITALS — BP 185/114 | HR 122 | Resp 16 | Wt 213.0 lb

## 2011-02-24 DIAGNOSIS — R0602 Shortness of breath: Secondary | ICD-10-CM

## 2011-02-24 MED ORDER — POTASSIUM CHLORIDE CRYS ER 20 MEQ PO TBCR: EXTENDED_RELEASE_TABLET | ORAL | Status: DC

## 2011-02-24 NOTE — Patient Instructions (Addendum)
Your physician has recommended you make the following change in your medication:  1) Increase lasix to 40mg  once daily for the next 7 days. 2) Start Potassium once daily for the next 7 days.  Your physician recommends that you schedule a follow-up appointment in: 2-3 months.

## 2011-02-24 NOTE — Assessment & Plan Note (Addendum)
[facility]                        ELECTROPHYSIOLOGY OFFICE NOTE  NAME:[name], [name]                        MRN:          [id number] DATE:02/24/2011                            DOB:          [birth year]   [name] was seen in followup for ongoing sinus tachycardia in the setting of myasthenia gravis for which he is status post thymectomy. Unfortunately, he has had worsening problems with his myasthenia with diplopia and ptosis and his Mestinon and prednisone dosing has had to be increased.  Concurrent with that has been acute increase in weight of about 20 pounds over an unspecified time.  He is short of breath and has significant amount of edema.  PHYSICAL EXAMINATION:  VITAL SIGNS:  Without having taken his blood pressure medications, his blood pressure was 185/113, his pulse was 122. His neck veins were 8-9 cm. LUNGS:  Clear.  His PMI was nondisplaced with a rapid rate without significant murmurs. ABDOMEN:  Soft. EXTREMITIES:  Had 2+ edema.  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  IMPRESSION: 1. Sinus tachycardia. 2. Congestive heart failure - acute on chronic diastolic. 3. Myasthenia gravis with a recent exacerbation.  Mr. Buda situation is very complicated and unfortunately it does not seem that we have locally the expertise to move forward in managing the myasthenia as well as his tachycardia.  He has talked to the doctors at [facility] and they have recommended that he go there to be seen through the emergency room.  I concur with that recommendation.  In the interim, I will increase his Lasix from 20 every other day to 40 mg a day.  We will add potassium 20 mEq a day.  He is to do this for maximum of 7 days.  We will plan to see him again in the future, I am going to say about 6-8 weeks hopefully but then he will have a chance to have had some input from the team at  [facility]    Duke Salvia, MD, [facility] Electronically Signed   SCK/MedQ  DD: 02/24/2011  DT: 02/24/2011  Job #: [id number]

## 2011-03-03 NOTE — Progress Notes (Signed)
dictated

## 2011-03-04 ENCOUNTER — Encounter: Payer: Medicare Other | Admitting: Internal Medicine

## 2011-03-06 ENCOUNTER — Encounter: Payer: Medicare Other | Admitting: Internal Medicine

## 2011-03-30 ENCOUNTER — Other Ambulatory Visit: Payer: Self-pay | Admitting: *Deleted

## 2011-03-30 DIAGNOSIS — G4700 Insomnia, unspecified: Secondary | ICD-10-CM

## 2011-03-30 NOTE — Telephone Encounter (Signed)
Pt is requesting #5 refills.  Thanks

## 2011-03-31 MED ORDER — ZOLPIDEM TARTRATE 10 MG PO TABS: 10.0000 mg | ORAL_TABLET | Freq: Every evening | ORAL | Status: DC | PRN

## 2011-03-31 NOTE — Telephone Encounter (Signed)
Refill for Ambien 10 mg tablets # 30 with 5 refills was called to the on [address] per order of Dr. Tonny Branch.

## 2011-04-13 ENCOUNTER — Telehealth: Payer: Self-pay | Admitting: Critical Care Medicine

## 2011-04-13 NOTE — Telephone Encounter (Signed)
Phone note from 10-2010 stated: Danny Levans, MD 10/23/2010 11:27 AM Signed  Call the pt and tell him he does not need further CT scans, the current nodule is no longer apparent on scans   [id number] to advise the pt. Carron Curie, CMA

## 2011-04-13 NOTE — Telephone Encounter (Signed)
Spoke with pt and notified according to records does not need any further scans done. Pt verbalized understanding and will keep appt with PW for tomorrow.

## 2011-04-13 NOTE — Telephone Encounter (Signed)
Pt is returning the call from Triage & can be reached at [phone].  [name]

## 2011-04-14 ENCOUNTER — Encounter: Payer: Self-pay | Admitting: Critical Care Medicine

## 2011-04-14 ENCOUNTER — Ambulatory Visit (INDEPENDENT_AMBULATORY_CARE_PROVIDER_SITE_OTHER): Payer: Medicare Other | Admitting: Critical Care Medicine

## 2011-04-14 DIAGNOSIS — J449 Chronic obstructive pulmonary disease, unspecified: Secondary | ICD-10-CM

## 2011-04-14 DIAGNOSIS — J984 Other disorders of lung: Secondary | ICD-10-CM

## 2011-04-14 NOTE — Assessment & Plan Note (Signed)
LLL nodule likely granuloma and benign, no further CT needed This is likely a benign granuloma does not require further imaging Plan no further CT scan imaging required

## 2011-04-14 NOTE — Progress Notes (Deleted)
Subjective:    Patient ID: [name], male    DOB: [birth year], 65 y.o.   MRN: [id number]  HPI    Review of Systems     Objective:   Physical Exam        Assessment & Plan:   Subjective:    Patient ID: [name], male    DOB: Jan 08, 1946, 65 y.o.   MRN: [id number]  HPI 65 yo WM referred for abn CXR.       This is a 65 year old male who presents with an abnormal radiology finding.  The patient complains of cough, but denies history of diagnosed COPD, shortness of breath, chest tightness, chest pain worse with breathing and coughing, wheezing, mucous production, nocturnal awakening, exercise induced symptoms, and congestion.    This pt has a hx of mild throat clearing and 30ys of smoking and quit smoking  1993.  Pt desired a CXR per PCP and was obtained .This revealed peribronchial thickening and  pt referred to pulm for eval. Pt denies any dyspnea or chest pain.  There is not any mucus or excess wheeze.  The pt has no specific pulm symptoms at this time.  Pt denies any significant sore throat, nasal congestion or excess secretions, fever, chills, sweats, unintended weight loss, pleurtic or exertional chest pain, orthopnea PND, or leg swelling Pt denies any increase in rescue therapy over baseline, denies waking up needing it or having any early am or nocturnal exacerbations of coughing/wheezing/or dyspnea. Pt also denies any obvious fluctuation in symptoms wiht weather or environmental change or other alleviating or aggravating factors  Spirometry obtained today and is normal.June 18, 2010 3:29 PM Pt now with myasthenia gravis.  Pt with non calc 5mm nodule. No other changes.  Pt denies any significant sore throat, nasal congestion or excess secretions, fever, chills, sweats, unintended weight loss, pleurtic or exertional chest pain, orthopnea PND, or leg swelling Pt denies any increase in rescue therapy over baseline, denies waking up needing it or having any early am or  nocturnal exacerbations of coughing/wheezing/or dyspnea.  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  Also had plasmapheresis.  Had dysphagia. Had eye drooping.  The eye drooping and dysphagia is better.  Dyspnea is better.  Mestinon helped.    04/14/2011 S/p thymectomy  4/12.  Notes some chest pain at surgery site,  When ankles swelled, saw Cardiology, Rx lasix. Already on mestinon.  Pt then quit salt intake and ok since that time. No real mucus.  Drooping helped.  MG better.  Plasmapheresis also helped.  Prednisone up to 20mg /d and this helped the weakness   Review of Systems Constitutional:   No  weight loss, night sweats,  Fevers, chills, fatigue, lassitude. HEENT:   No headaches,  Difficulty swallowing,  Tooth/dental problems,  Sore throat,                No sneezing, itching, ear ache, nasal congestion, post nasal drip,   CV:  No chest pain,  Orthopnea, PND, swelling in lower extremities, anasarca, dizziness, palpitations  GI  No heartburn, indigestion, abdominal pain, nausea, vomiting, diarrhea, change in bowel habits, loss of appetite  Resp: No shortness of breath with exertion or at rest.  No excess mucus, no productive cough,  No non-productive cough,  No coughing up of blood.  No change in color of mucus.  No wheezing.  No chest wall deformity  Skin: no rash or lesions.  GU:  no dysuria, change in color of urine, no urgency or frequency.  No flank pain.  MS:  No joint pain or swelling.  No decreased range of motion.  No back pain.  Psych:  No change in mood or affect. No depression or anxiety.  No memory loss.     Objective:   Physical Exam Filed Vitals:   10/22/10 1414  BP: 124/66  Pulse: 125  Temp: 98.8 F (37.1 C)  TempSrc: Oral  Height: 5\' 7"  (1.702 m)  Weight: 210 lb 6.4 oz (95.437 kg)  SpO2: 96%    Gen: Pleasant, well-nourished, in no distress,  normal affect  ENT: No lesions,  mouth clear,  oropharynx clear, no postnasal  drip  Neck: No JVD, no TMG, no carotid bruits  Lungs: No use of accessory muscles, no dullness to percussion, clear without rales or rhonchi  Cardiovascular: RRR, heart sounds normal, no murmur or gallops, no peripheral edema  Abdomen: soft and NT, no HSM,  BS normal  Musculoskeletal: No deformities, no cyanosis or clubbing  Neuro: alert, non focal  Skin: Warm, no lesions or rashes      PFT Conversion 04/09/2010  FVC PREDICT 4.111  FVC  % Predicted 81.99  FEV1 PREDICT 3.063  FEV % Predicted 85.61  FEV1/FVC PRE 74.637  FEV1/FVC%EXP 104.239  FEF % EXPEC 93.798   CT of Chest  Procedure date:  05/30/2010  Findings:      Centrilobular emphysema,  LLL 3mm noncalcified granuloma  Assessment & Plan:

## 2011-04-14 NOTE — Patient Instructions (Signed)
No change in medications. Return as needed 

## 2011-04-14 NOTE — Assessment & Plan Note (Signed)
Very mild chronic obstructive lung disease with centrilobular emphysema seen at the apices of the lungs on CT scanning Plan No inhaled medications as prescribed Return as needed

## 2011-04-14 NOTE — Progress Notes (Signed)
Subjective:    Patient ID: [name], male    DOB: [birth year], 65 y.o.   MRN: [id number]  HPI  65 yo WM referred for abn CXR.       This is a  65 y.o.   male who presents with an abnormal radiology finding.  The patient complains of cough, but denies history of diagnosed COPD, shortness of breath, chest tightness, chest pain worse with breathing and coughing, wheezing, mucous production, nocturnal awakening, exercise induced symptoms, and congestion.    This pt has a hx of mild throat clearing and 30ys of smoking and quit smoking  1993.  Pt desired a CXR per PCP and was obtained .This revealed peribronchial thickening and  pt referred to pulm for eval. Pt denies any dyspnea or chest pain.  There is not any mucus or excess wheeze.  The pt has no specific pulm symptoms at this time.  Pt denies any significant sore throat, nasal congestion or excess secretions, fever, chills, sweats, unintended weight loss, pleurtic or exertional chest pain, orthopnea PND, or leg swelling Pt denies any increase in rescue therapy over baseline, denies waking up needing it or having any early am or nocturnal exacerbations of coughing/wheezing/or dyspnea. Pt also denies any obvious fluctuation in symptoms wiht weather or environmental change or other alleviating or aggravating factors  Spirometry obtained today and is normal.June 18, 2010 3:29 PM Pt now with myasthenia gravis.  Pt with non calc 5mm nodule. No other changes.  Pt denies any significant sore throat, nasal congestion or excess secretions, fever, chills, sweats, unintended weight loss, pleurtic or exertional chest pain, orthopnea PND, or leg swelling Pt denies any increase in rescue therapy over baseline, denies waking up needing it or having any early am or nocturnal exacerbations of coughing/wheezing/or dyspnea.  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  Also had plasmapheresis.  Had dysphagia. Had eye drooping.    The eye drooping and dysphagia is better.  Dyspnea is better.  Mestinon helped.    04/14/2011 S/p thymectomy  4/12.  Notes some chest pain at surgery site,  When ankles swelled, saw Cardiology, Rx lasix. Already on mestinon.  Pt then quit salt intake and ok since that time. No real mucus.  Drooping helped.  MG better.  Plasmapheresis also helped.  Prednisone up to 20mg /d and this helped the weakness     Review of Systems Constitutional:   No  weight loss, night sweats,  Fevers, chills, fatigue, lassitude. HEENT:   No headaches,  Difficulty swallowing,  Tooth/dental problems,  Sore throat,                No sneezing, itching, ear ache, nasal congestion, post nasal drip,   CV:  No chest pain,  Orthopnea, PND, swelling in lower extremities, anasarca, dizziness, palpitations  GI  No heartburn, indigestion, abdominal pain, nausea, vomiting, diarrhea, change in bowel habits, loss of appetite  Resp: No shortness of breath with exertion or at rest.  No excess mucus, no productive cough,  No non-productive cough,  No coughing up of blood.  No change in color of mucus.  No wheezing.  No chest wall deformity  Skin: no rash or lesions.  GU: no dysuria, change in color of urine, no urgency or frequency.  No flank pain.  MS:  No joint pain or swelling.  No decreased range of motion.  No back pain.  Psych:  No change in mood or affect. No depression  or anxiety.  No memory loss.     Objective:   Physical Exam  Filed Vitals:   04/14/11 1043  BP: 142/90  Pulse: 112  Temp: 97.8 F (36.6 C)  TempSrc: Oral  Height: 5\' 7"  (1.702 m)  Weight: 212 lb 6.4 oz (96.344 kg)  SpO2: 98%    Gen: Pleasant, well-nourished, in no distress,  normal affect  ENT: No lesions,  mouth clear,  oropharynx clear, no postnasal drip  Neck: No JVD, no TMG, no carotid bruits  Lungs: No use of accessory muscles, no dullness to percussion, clear without rales or rhonchi  Cardiovascular: RRR, heart sounds normal,  no murmur or gallops, no peripheral edema  Abdomen: soft and NT, no HSM,  BS normal  Musculoskeletal: No deformities, no cyanosis or clubbing  Neuro: alert, non focal  Skin: Warm, no lesions or rashes       Assessment & Plan:   PULMONARY NODULE, LEFT LOWER LOBE LLL nodule likely granuloma and benign, no further CT needed This is likely a benign granuloma does not require further imaging Plan no further CT scan imaging required  COPD, 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 B Complex-C (B-COMPLEX WITH VITAMIN C) tablet Take 1 tablet by mouth daily.        . benazepril (LOTENSIN) 40 MG tablet Take 1 tablet (40 mg total) by mouth daily. For blood pressure  30 tablet  3  . Bismuth Subsalicylate (PEPTO-BISMOL MAX STRENGTH) 525 MG/15ML SUSP Take by mouth as directed.        . Cholecalciferol (VITAMIN D-3 PO) Take by mouth. 10000 units once daily      . Cyanocobalamin (VITAMIN B 12 PO) Take 1 tablet by mouth daily.       Marland Kitchen FREESTYLE LITE test strip USE TO TEST BLOOD SUGAR UP TO 3X A DAY FOR THE NEXT WEEK, THEN DECREASE TO UP TO 1X/DAY (250.00)  100 each  10  . furosemide (LASIX) 20 MG tablet Take one tablet by mouth every other day.  30 tablet  6  . glipiZIDE (GLUCOTROL) 10 MG tablet Take 1 tablet (10 mg total) by mouth 2 (two) times daily before a meal. For diabetes.  60 tablet  6  . Insulin Pen Needle (PEN NEEDLES) 31G X 6 MM MISC Use as instructed      . Lancets (FREESTYLE) lancets Use as instructed      . metFORMIN (GLUCOPHAGE) 1000 MG tablet Take 1 tablet (1,000 mg total) by mouth 2 (two) times daily.  180 tablet  2  . potassium chloride SA (K-DUR,KLOR-CON) 20 MEQ tablet Take one tablet by mouth daily as directed.  30 tablet   3  . predniSONE (DELTASONE) 20 MG tablet Take 20 mg by mouth daily.       Marland Kitchen pyridostigmine (MESTINON) 60 MG tablet 30 mg as directed. Up to 5 times daily as needed.      . zolpidem (AMBIEN) 10 MG tablet Take 1 tablet (10 mg total) by mouth at bedtime as needed. Insomnia   30 tablet  5

## 2011-04-24 ENCOUNTER — Telehealth: Payer: Self-pay | Admitting: Internal Medicine

## 2011-04-24 NOTE — Telephone Encounter (Signed)
Pt calling back to talk with heather, says it's very important and susan with [facility] will be trying to call you as well

## 2011-04-24 NOTE — Telephone Encounter (Signed)
New problem:  Per Darl Pikes from the cardiac surgeon - patient is stating he has a fast heart rate. Patient is very aggregative on the phone. Patient calling around to different section of the office trying to get an appt. Darl Pikes is  Unclear of what the patient is asking .

## 2011-04-24 NOTE — Telephone Encounter (Signed)
I left a message for [name] to call. I also spoke with the patient. He states he has been cleared to see Dr. Dimas Aguas, the Myasthenia Gravis specialist in [facility]. He states that he has to call back down there for an appointment. He states he has spoken with several people about establishing with a cardiologist there as well that specialize with MG patients. He states he spoke with [name] who is a Merchandiser, retail and now has spoken with [name] since his main contact [name]) is out of the office today. He needs his records sent to [facility] to see what kind of specialist he needs to see. I explained to him I have left a message for [name] to call. He wants me to call her back multiple times, I explained I can't do anything if she doesn't answer her phone. I will await [name]'s call to try to figure out what is going on with the patient.

## 2011-04-27 ENCOUNTER — Telehealth: Payer: Self-pay | Admitting: Internal Medicine

## 2011-04-27 NOTE — Telephone Encounter (Signed)
I spoke with the patient. I made him aware I spoke with Darl Pikes and that this is not who he needs to be speaking with. I also let him know I spoke with Dr. Graciela Husbands and he recommends getting in with Dr. Dimas Aguas to drive his care with his MG. The patient is worried about an episode of extreme SOB that he had recently. He wants to know if this was cardiac related. I explained I am not certain if that was due to his heart, lungs, or strain on his body due to sleep apnea that he says that he has. I recommended that he keep his appointment with Dr. Graciela Husbands tomorrow and that he write down and bring all his questions with him. He is discouraged by extreme fatigue. I again explained I cannot say for sure that this is related to any one thing, but is most likely a combination of things. He will followup with Dr. Graciela Husbands tomorrow.

## 2011-04-27 NOTE — Telephone Encounter (Signed)
I did speak with Darl Pikes today. I explained to her that I am not certain what is going on with the patient and establishing cardiology at [facility]. I was aware that he was trying to get with Dr. Dimas Aguas. Per Darl Pikes, she is in cardiac surgery scheduling. She states that she thought the patient might be needing convergent ablation. I explained this is not the case. I will review with Dr. Graciela Husbands to see where the patient might need to be directed to and call the patient back.

## 2011-04-27 NOTE — Telephone Encounter (Signed)
Routing to Dr. Klein

## 2011-04-27 NOTE — Telephone Encounter (Addendum)
Pt calling back to see if you have spoken with susan? [phone]

## 2011-04-27 NOTE — Telephone Encounter (Signed)
I left a message for Darl Pikes to call.

## 2011-04-28 ENCOUNTER — Ambulatory Visit (INDEPENDENT_AMBULATORY_CARE_PROVIDER_SITE_OTHER): Payer: Medicare Other | Admitting: Internal Medicine

## 2011-04-28 ENCOUNTER — Encounter: Payer: Self-pay | Admitting: Internal Medicine

## 2011-04-28 DIAGNOSIS — I498 Other specified cardiac arrhythmias: Secondary | ICD-10-CM

## 2011-04-28 DIAGNOSIS — R000 Tachycardia, unspecified: Secondary | ICD-10-CM

## 2011-04-28 DIAGNOSIS — G4733 Obstructive sleep apnea (adult) (pediatric): Secondary | ICD-10-CM

## 2011-04-28 DIAGNOSIS — R609 Edema, unspecified: Secondary | ICD-10-CM

## 2011-04-28 NOTE — Patient Instructions (Signed)
Your physician has recommended you make the following change in your medication:  1) Increase lasix to 20mg  once daily. 2) Increase potassium to once daily.  Please call and let Herbert Seta know when you are scheduled to see Dr. Dimas Aguas. We will have you see Dr. Graciela Husbands after that.

## 2011-04-28 NOTE — Assessment & Plan Note (Signed)
This is just been diagnosed. It may well be beneficial impact on blood pressure and heart rate related to therapy of this as well as a profound fatigue which he suffered

## 2011-04-28 NOTE — Progress Notes (Signed)
HPI  [name] is a 65 y.o. male Seen in followup for tachycardia in the context of myasthenia gravis which has had a huge impact on oxygen for drug therapy. He is currently awaiting evaluation at [facility] had problems peripheral edema. He is on prednisone. He thinks that he has significant urination with his Mestinon. He has been diagnosed with sleep apnea and will start on cpap  Otherwise he is quite stable  Past Medical History  Diagnosis Date  . Sinus tachycardia 2006    Cardiac Cath unremarkable  . Right bundle branch block and left posterior fascicular block   . Orthostatic hypotension   . Unspecified essential hypertension   . Hyperlipidemia   . DM (diabetes mellitus)   . ADD (attention deficit disorder)   . Decreased libido   . 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 Cervical spondylosis with radiculopathy   . Depression   . Syrinx     T5-T6  . Cord compression     Cord compression syndrome C5-C6, C6-C7    Past Surgical History  Procedure Date  . Thymectomy april 2012    Current Outpatient Prescriptions  Medication Sig Dispense Refill  . B Complex-C (B-COMPLEX WITH VITAMIN C) tablet Take 1 tablet by mouth daily.        . benazepril (LOTENSIN) 40 MG tablet Take 1 tablet (40 mg total) by mouth daily. For blood pressure  30 tablet  3  . Bismuth Subsalicylate (PEPTO-BISMOL MAX STRENGTH) 525 MG/15ML SUSP Take by mouth as directed.        . Cholecalciferol (VITAMIN D-3 PO) Take by mouth. 10000 units once daily      . Cyanocobalamin (VITAMIN B 12 PO) Take 1 tablet by mouth daily.       Marland Kitchen FREESTYLE LITE test strip USE TO TEST BLOOD SUGAR UP TO 3X A DAY FOR THE NEXT WEEK, THEN DECREASE TO UP TO 1X/DAY (250.00)  100 each  10  . glipiZIDE (GLUCOTROL) 10 MG tablet Take 1 tablet (10 mg total) by mouth 2 (two) times daily before a meal. For diabetes.  60 tablet  6  . Insulin Pen Needle (PEN NEEDLES) 31G X 6 MM MISC Use as instructed      .  Lancets (FREESTYLE) lancets Use as instructed      . metFORMIN (GLUCOPHAGE) 1000 MG tablet Take 1 tablet (1,000 mg total) by mouth 2 (two) times daily.  180 tablet  2  . predniSONE (DELTASONE) 20 MG tablet Take 20 mg by mouth daily.       Marland Kitchen pyridostigmine (MESTINON) 60 MG tablet 30 mg as directed. Up to 5 times daily as needed.      . zolpidem (AMBIEN) 10 MG tablet Take 1 tablet (10 mg total) by mouth at bedtime as needed. Insomnia   30 tablet  5  . furosemide (LASIX) 20 MG tablet Take one tablet by mouth every other day.  30 tablet  6  . potassium chloride SA (K-DUR,KLOR-CON) 20 MEQ tablet Take one tablet by mouth daily as directed.  30 tablet  3    Allergies  Allergen Reactions  . Aminoglycosides   . Beta Adrenergic Blockers     REACTION: pt has MG  . Calcium Channel Blockers     REACTION: pt has MG  . Iodinated Diagnostic Agents   . Neuromuscular Blocking Agents     Review of Systems negative except from HPI and PMH  Physical  Exam Well developed and well nourished in no acute distress HENT normal E scleral and icterus clear Neck Supple JVP flat; carotids brisk and full Clear to ausculation Regular rate and rhythm, no murmurs gallops or rub Soft with active bowel sounds No clubbing cyanosis 3+ edema Alert and oriented, grossly normal motor and sensory function Skin Warm and Dry    Assessment and  Plan

## 2011-04-28 NOTE — Assessment & Plan Note (Signed)
Will increase his Lasix to daily. He'll also take potassium.

## 2011-04-28 NOTE — Assessment & Plan Note (Addendum)
Remains an issue. We'll await input from the myahsthenia gravis clinic

## 2011-05-01 ENCOUNTER — Telehealth: Payer: Self-pay | Admitting: *Deleted

## 2011-05-01 DIAGNOSIS — R000 Tachycardia, unspecified: Secondary | ICD-10-CM

## 2011-05-01 NOTE — Telephone Encounter (Signed)
I spoke with the patient. He will come around 05/19/11 for repeat labs.

## 2011-05-01 NOTE — Telephone Encounter (Signed)
I left a message for the patient to call. I forgot to schedule him to come back in 3 weeks for a BMP to be done since we increased his lasix at his most recent office visit. Order placed. He just needs an appointment.

## 2011-05-14 ENCOUNTER — Telehealth: Payer: Self-pay | Admitting: Internal Medicine

## 2011-05-14 DIAGNOSIS — R0602 Shortness of breath: Secondary | ICD-10-CM

## 2011-05-14 NOTE — Telephone Encounter (Signed)
Attempted to call the patient. Busy x 2 tries. Will call back.

## 2011-05-14 NOTE — Telephone Encounter (Signed)
New msg Pt wants to talk to you please call him back

## 2011-05-15 NOTE — Telephone Encounter (Signed)
I spoke with the patient. He states he is having a terrible time with dizziness when he bends over and stands up. He is also having increased SOB. He is weighing daily and his weight is down 5 lbs from when he was in our office last. He feels these symptoms are not related to his Myasthenia Gravis, but he is concerned that there is cardiac involvement. I explained to the patient that the symptoms he is having may or may not be cardiac related. He would like to pursue further cardiac testing. We had a lengthy conversation about nuclear stress testing and echo. I explained this is usually our first line of testing. He states that he is concerned about a blockage in the brain that is causing his dizziness. I explained this may be due to a shift in his BP as he stands up. I also advised that we may potentially look at a carotid US, but I am not certain if he indeed has plaque involvement. He states he had heard about a test that involves dye. I explained that cardiac cath and CT scanning use dye. I advised that cardiac cath is considered invasive testing and therefore has more risk involved, including strain on the kidneys. I made him aware that non-invasive testing would be a safer alternative- including possibly a myoview, echo, and CBC to evaluate for anemia. We would need to be cautious with the use of contrast as the patient states there is one kind that can be used with MG. I will review with Dr. Graciela Husbands and call the patient back with his recommendations. He is very anxious to pursue testing.

## 2011-05-19 ENCOUNTER — Other Ambulatory Visit (INDEPENDENT_AMBULATORY_CARE_PROVIDER_SITE_OTHER): Payer: Medicare Other | Admitting: *Deleted

## 2011-05-19 DIAGNOSIS — I498 Other specified cardiac arrhythmias: Secondary | ICD-10-CM

## 2011-05-19 DIAGNOSIS — R000 Tachycardia, unspecified: Secondary | ICD-10-CM

## 2011-05-19 LAB — BASIC METABOLIC PANEL
Chloride: 99 mEq/L (ref 96–112)
Creatinine, Ser: 1.1 mg/dL (ref 0.4–1.5)
Potassium: 4.2 mEq/L (ref 3.5–5.1)
Sodium: 137 mEq/L (ref 135–145)

## 2011-05-20 NOTE — Telephone Encounter (Signed)
I spoke with the patient and made him aware that Dr. Graciela Husbands would not be able to call him back this evening because he is now out of the office. I advised I was not certain if he would any testing or not, but may refer him back to his PCP. He stated at first that he did not have a PCP, but just had specialist for everything. I inquired who was treating his DM. He states he has seen [facility], but does really follow with them. I advised he obtain a PCP that he feels good about, otherwise, he will not be able to get his meds. I made him aware I would leave word for Dr. Graciela Husbands to try to reach him between 1-2 pm tomorrow. He verbalizes understanding.

## 2011-05-20 NOTE — Telephone Encounter (Signed)
Pt calling back , wants to talk to heather today and have dr Graciela Husbands call him between 6 and 630p tonight, [phone]

## 2011-05-21 NOTE — Telephone Encounter (Signed)
I spoke with the patient. We reviewed his blood sugar is over 400. I've encouraged him to followup with his PCP regarding this. He is also complaining of dyspnea on exertion. Echo a year ago was normal. Catheterization 2006 was normal. Given his diabetes however, we'll undertake a Myoview lexiscan. I've asked him to keep [place] informed about his evaluation at [facility] for his myasthenia gravis. In the event that his Myoview is unlike pain, follow up with pulmonary and/or neurology would be appropriate

## 2011-05-22 ENCOUNTER — Ambulatory Visit (INDEPENDENT_AMBULATORY_CARE_PROVIDER_SITE_OTHER): Payer: Medicare Other | Admitting: *Deleted

## 2011-05-22 ENCOUNTER — Telehealth: Payer: Self-pay | Admitting: *Deleted

## 2011-05-22 DIAGNOSIS — Z23 Encounter for immunization: Secondary | ICD-10-CM

## 2011-05-22 NOTE — Telephone Encounter (Signed)
Addended by: Sherri Rad C on: 05/22/2011 02:50 PM   Modules accepted: Orders

## 2011-05-22 NOTE — Telephone Encounter (Signed)
Per Dr. Graciela Husbands- lexiscan myoview. The patient is aware this is being ordered and our PCC's will be calling him about this.

## 2011-05-22 NOTE — Telephone Encounter (Signed)
Left message for patient to call ans schedule myoview.

## 2011-05-28 ENCOUNTER — Encounter: Payer: Self-pay | Admitting: *Deleted

## 2011-06-04 ENCOUNTER — Ambulatory Visit (HOSPITAL_COMMUNITY): Payer: Medicare Other | Attending: Internal Medicine | Admitting: Radiology

## 2011-06-04 VITALS — BP 169/98 | Ht 67.0 in | Wt 213.0 lb

## 2011-06-04 DIAGNOSIS — R42 Dizziness and giddiness: Secondary | ICD-10-CM | POA: Insufficient documentation

## 2011-06-04 DIAGNOSIS — I10 Essential (primary) hypertension: Secondary | ICD-10-CM | POA: Insufficient documentation

## 2011-06-04 DIAGNOSIS — R5381 Other malaise: Secondary | ICD-10-CM | POA: Insufficient documentation

## 2011-06-04 DIAGNOSIS — R079 Chest pain, unspecified: Secondary | ICD-10-CM

## 2011-06-04 DIAGNOSIS — E119 Type 2 diabetes mellitus without complications: Secondary | ICD-10-CM | POA: Insufficient documentation

## 2011-06-04 DIAGNOSIS — Z87891 Personal history of nicotine dependence: Secondary | ICD-10-CM | POA: Insufficient documentation

## 2011-06-04 DIAGNOSIS — R0602 Shortness of breath: Secondary | ICD-10-CM | POA: Insufficient documentation

## 2011-06-04 DIAGNOSIS — J449 Chronic obstructive pulmonary disease, unspecified: Secondary | ICD-10-CM | POA: Insufficient documentation

## 2011-06-04 DIAGNOSIS — E785 Hyperlipidemia, unspecified: Secondary | ICD-10-CM | POA: Insufficient documentation

## 2011-06-04 DIAGNOSIS — R002 Palpitations: Secondary | ICD-10-CM | POA: Insufficient documentation

## 2011-06-04 DIAGNOSIS — J4489 Other specified chronic obstructive pulmonary disease: Secondary | ICD-10-CM | POA: Insufficient documentation

## 2011-06-04 DIAGNOSIS — I4510 Unspecified right bundle-branch block: Secondary | ICD-10-CM | POA: Insufficient documentation

## 2011-06-04 DIAGNOSIS — R000 Tachycardia, unspecified: Secondary | ICD-10-CM | POA: Insufficient documentation

## 2011-06-04 MED ORDER — TECHNETIUM TC 99M TETROFOSMIN IV KIT
10.0000 | PACK | Freq: Once | INTRAVENOUS | Status: AC | PRN
  Administered 2011-06-04: 10 via INTRAVENOUS

## 2011-06-04 MED ORDER — REGADENOSON 0.4 MG/5ML IV SOLN
0.4000 mg | Freq: Once | INTRAVENOUS | Status: AC
  Administered 2011-06-04: 0.4 mg via INTRAVENOUS

## 2011-06-04 MED ORDER — TECHNETIUM TC 99M TETROFOSMIN IV KIT
30.0000 | PACK | Freq: Once | INTRAVENOUS | Status: AC | PRN
  Administered 2011-06-04: 30 via INTRAVENOUS

## 2011-06-04 NOTE — Progress Notes (Signed)
[facility] SITE 3 NUCLEAR MED [address] [place] [address] [phone]  Cardiology Nuclear Med Study  [name] is a 65 y.o. male [id number] September 30, 1945   Nuclear Med Background Indication for Stress Test:  Evaluation for Ischemia History:  COPD,2011 Echo-EF 55-60% and 2006 Heart Catheterization- EF 70%, NML Cardiac Risk Factors: History of Smoking, Hypertension, Lipids, NIDDM and RBBB  Symptoms:  Chest Pain, Dizziness, DOE, Fatigue, Palpitations, Rapid HR and SOB   Nuclear Pre-Procedure Caffeine/Decaff Intake:  10:00pm NPO After: milk and oatmeal at 1115 ok'd per Brett Canales   Lungs:  Clear IV 0.9% NS with Angio Cath:  20g  IV Site: R Hand  IV Started by:  Cathlyn Parsons, RN  Chest Size (in):  48 Cup Size: n/a  Height: 5\' 7"  (1.702 m)  Weight:  213 lb (96.616 kg)  BMI:  Body mass index is 33.36 kg/(m^2). Tech Comments:  n/a    Nuclear Med Study 1 or 2 day study: 1 day  Stress Test Type:  Lexiscan  Reading MD: Arvilla Meres, MD  Order Authorizing Provider:  Curlene Dolphin  Resting Radionuclide: Technetium 1m Tetrofosmin  Resting Radionuclide Dose: 10.7 mCi   Stress Radionuclide:  Technetium 58m Tetrofosmin  Stress Radionuclide Dose: 33.0 mCi           Stress Protocol Rest HR: 117 Stress HR: 121  Rest BP: 169/98 Stress BP: 161/90  Exercise Time (min): n/a METS: n/a   Predicted Max HR: 153 bpm % Max HR: 79.08 bpm Rate Pressure Product: 21308   Dose of Adenosine (mg):  n/a Dose of Lexiscan: 0.4 mg  Dose of Atropine (mg): n/a Dose of Dobutamine: n/a mcg/kg/min (at max HR)  Stress Test Technologist: Bonnita Levan, RN  Nuclear Technologist:  Domenic Polite, CNMT     Rest Procedure:  Myocardial perfusion imaging was performed at rest 45 minutes following the intravenous administration of Technetium 3m Tetrofosmin. Rest ECG: Sinus tachycardia, RBBB, occ. PAC's, PVC's  Stress Procedure:  The patient received IV Lexiscan 0.4 mg over  15-seconds.  Technetium 83m Tetrofosmin injected at 30-seconds.  There were no significant changes with Lexiscan.  Quantitative spect images were obtained after a 45 minute delay. Stress ECG: No significant change from baseline ECG  QPS Raw Data Images:  Normal; no motion artifact; normal heart/lung ratio. Stress Images:  Normal homogeneous uptake in all areas of the myocardium. Rest Images:  Normal homogeneous uptake in all areas of the myocardium. Subtraction (SDS):  No evidence of ischemia. Transient Ischemic Dilatation (Normal <1.22):  1.02 Lung/Heart Ratio (Normal <0.45):  0.36  Quantitative Gated Spect Images QGS EDV:  119 ml QGS ESV:  69 ml QGS cine images:  LV is mildly dilated. There is mild global HK QGS EF: 42%  Impression Exercise Capacity:  Lexiscan with no exercise. BP Response:  n/a Clinical Symptoms:  n/a ECG Impression:  Baseline RBBB. No significant ST changes with Lexiscan.  Comparison with Prior Nuclear Study: No images to compare  Overall Impression:  Abnormal stress nuclear study. No evidence of ischemia or scar, however LV function appears mildly depressed but visually appears better than 42%. Consider 2-D echo to correlate.    [name] 6:22 PM

## 2011-06-06 IMAGING — CR DG CHEST 2V
2 series · 2 of 2 positions shown · non-contrast
Comparison: Chest radiograph and CT of the chest performed
08/17/2010

CLINICAL DATA: Preoperative chest radiograph for dialysis catheter
placement.

CHEST - 2 VIEW

[view not recorded (1 of 2)]
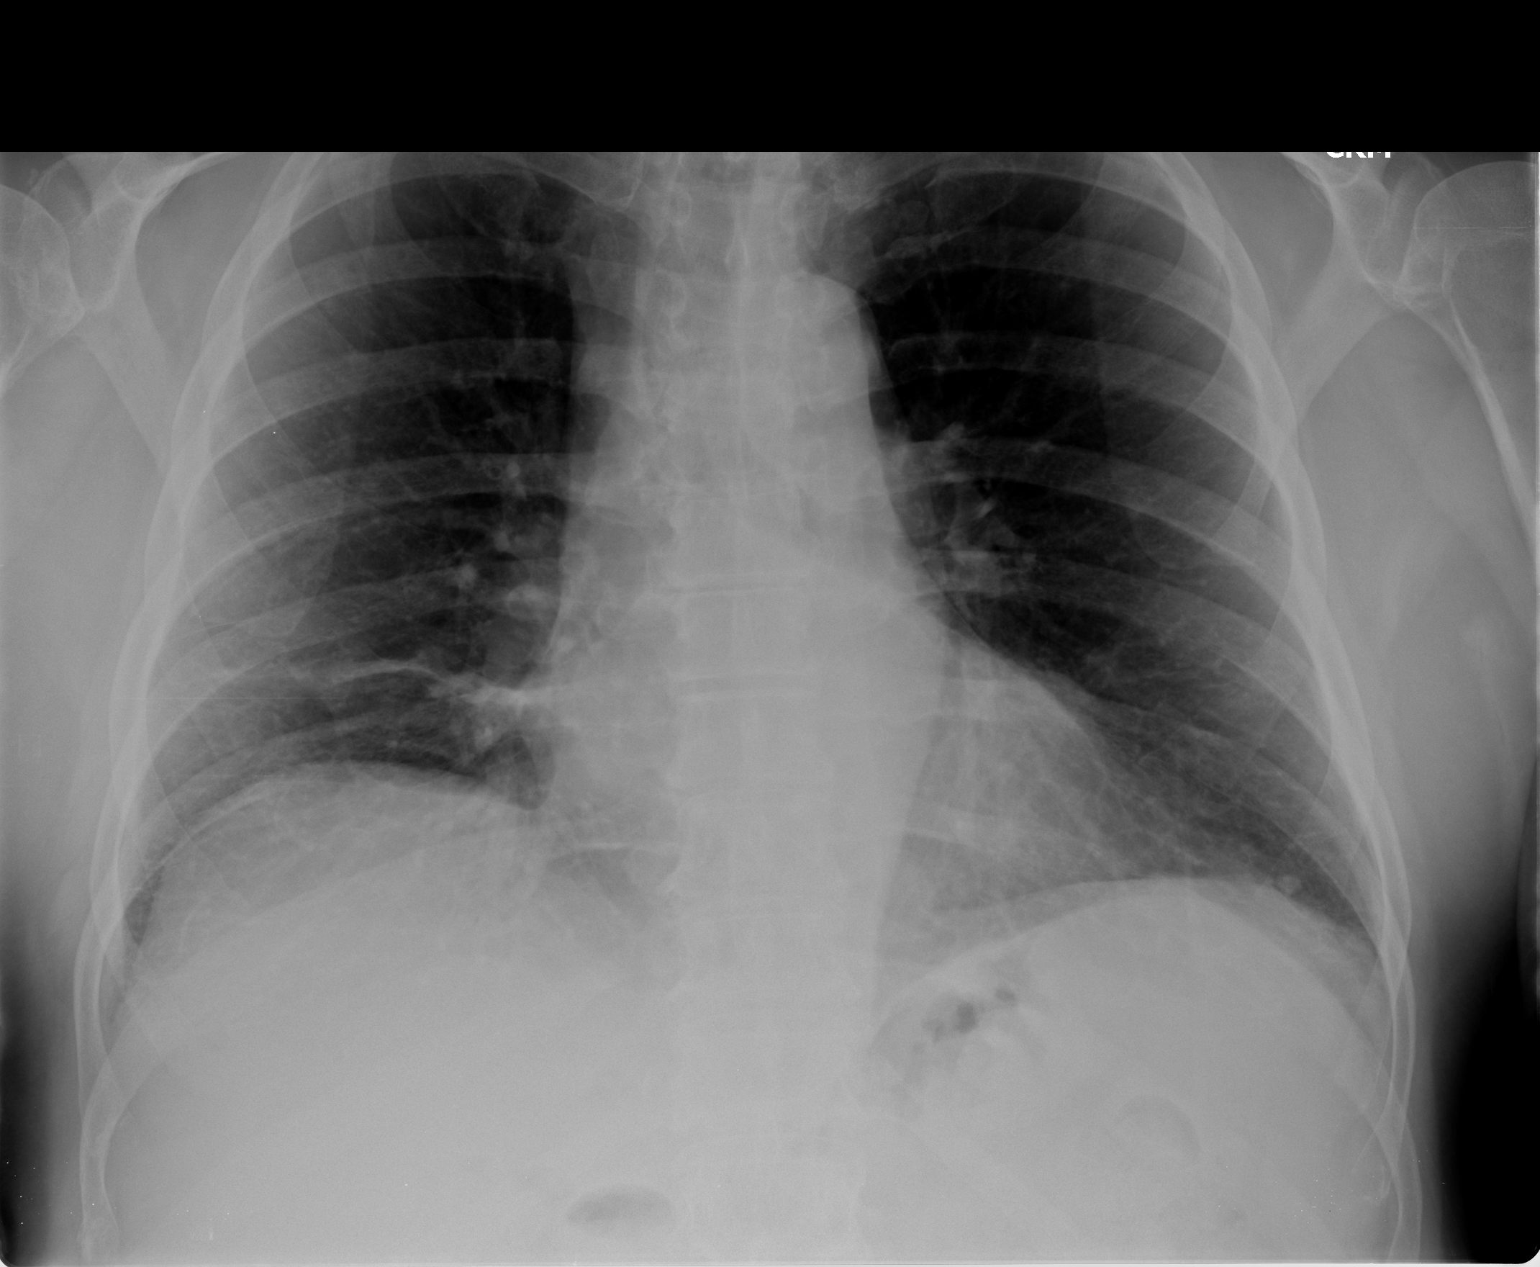

[view not recorded (2 of 2)]
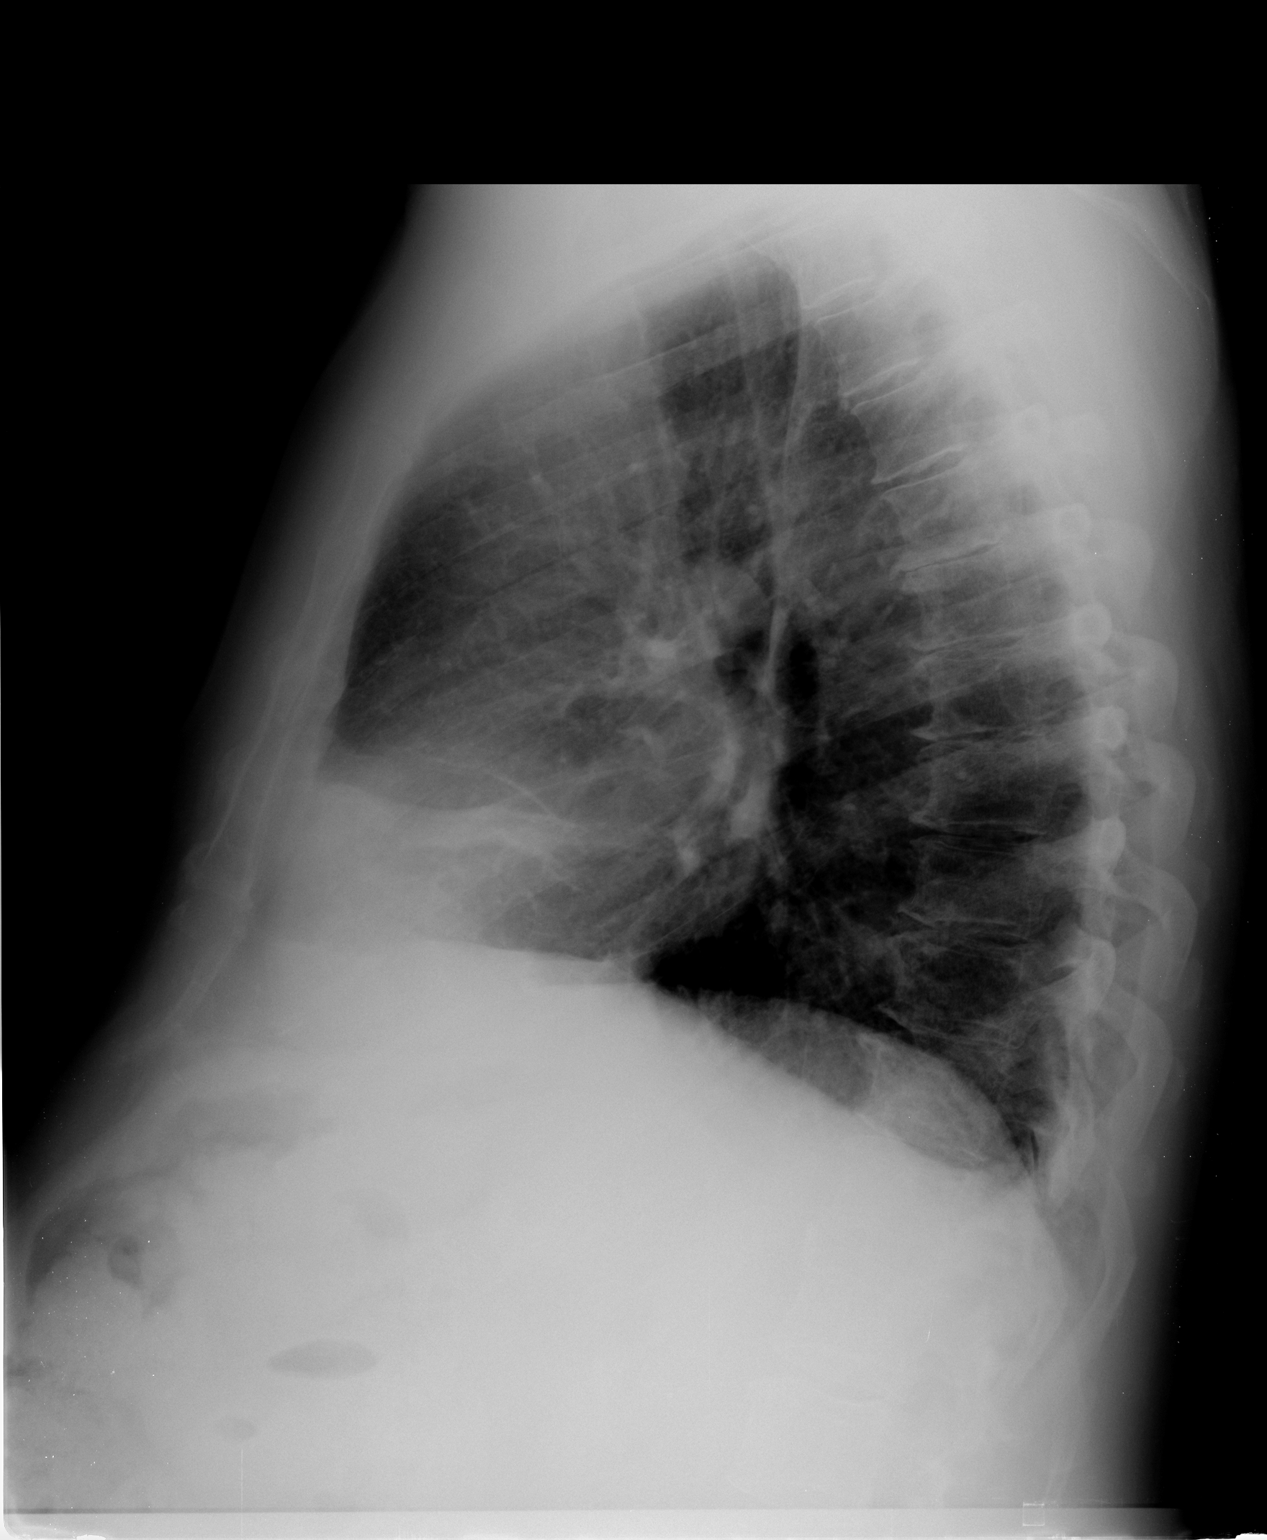

[2 of 2 positions shown; findings below may reference images not displayed]

FINDINGS: The lungs are well-aerated.  Right middle lobe linear
atelectasis is noted.  There is no evidence of pleural effusion or
pneumothorax.

The heart is normal in size; the mediastinal contour is within
normal limits.  No acute osseous abnormalities are seen.
IMPRESSION: Right middle lobe linear atelectasis noted; lungs otherwise clear.

## 2011-06-10 ENCOUNTER — Telehealth: Payer: Self-pay | Admitting: *Deleted

## 2011-06-10 NOTE — Telephone Encounter (Signed)
I'm okay with that.  I looked in [place] and we have no history of pneumovax.

## 2011-06-10 NOTE — Telephone Encounter (Signed)
Pt calls and would like to get pneumonia shot, will you approve this order?

## 2011-06-12 ENCOUNTER — Ambulatory Visit: Payer: Medicare Other | Admitting: *Deleted

## 2011-06-12 DIAGNOSIS — Z23 Encounter for immunization: Secondary | ICD-10-CM

## 2011-06-12 MED ORDER — PNEUMOCOCCAL VAC POLYVALENT 25 MCG/0.5ML IJ INJ: 0.5000 mL | INJECTION | INTRAMUSCULAR | Status: AC

## 2011-06-22 ENCOUNTER — Telehealth: Payer: Self-pay | Admitting: Internal Medicine

## 2011-06-22 DIAGNOSIS — I498 Other specified cardiac arrhythmias: Secondary | ICD-10-CM

## 2011-06-22 DIAGNOSIS — R000 Tachycardia, unspecified: Secondary | ICD-10-CM

## 2011-06-22 DIAGNOSIS — G4733 Obstructive sleep apnea (adult) (pediatric): Secondary | ICD-10-CM

## 2011-06-22 DIAGNOSIS — R609 Edema, unspecified: Secondary | ICD-10-CM

## 2011-06-22 NOTE — Telephone Encounter (Signed)
Fu call °Patient returning your call about lab results  °

## 2011-06-22 NOTE — Telephone Encounter (Signed)
Patient aware of Lexiscan Myoview results. He would like to know when will be the Follow up of this result. Pt. Said if you need to make an appointment just do any date and time , leave him a message about it , and he will be here. Patient also said he had spoken with Herbert Seta  about  taking 10 mg lasix daily which it did not help, then Lasix was  increased to 20 mg daily which help to bring the swelling down slightly. He would like to know if he can increased the Lasix 20 mg twice a day instead of once a day.

## 2011-06-23 MED ORDER — FUROSEMIDE 20 MG PO TABS: ORAL_TABLET | ORAL | Status: DC

## 2011-06-23 NOTE — Telephone Encounter (Signed)
Will forward to Dr. Klein to review. 

## 2011-06-23 NOTE — Telephone Encounter (Signed)
Fu call again Pt said he has been taking 40 mg in lasix in morning they are 20 mg tablets. Do you want him to take 40 mg at night. Let him know

## 2011-06-23 NOTE — Telephone Encounter (Signed)
He conservatively increase his Lasix to 20 twice a day. We should check a metabolic profile in about 2 weeks.  As relates to his left ventricular dysfunction, we need work from [facility] neurology as to whether beta blockers are acceptable. Otherwise he is on appropriate medications.

## 2011-06-23 NOTE — Telephone Encounter (Signed)
Addended by: Sherri Rad C on: 06/23/2011 02:57 PM   Modules accepted: Orders

## 2011-06-23 NOTE — Telephone Encounter (Signed)
I spoke with the patient and made him aware of Dr. Odessa Fleming recommendations to increase lasix to 20mg  BID with a repeat BMP in 2 weeks. He will come on 07/07/11 for this. He is concerned about his weight gain. He states he has eliminated sodium from his diet with the canned foods he eats. I advised this should not be related to his EF as it is about 42% right now. He denies worsening SOB. He states this is better with C-PAP and the change of the water filter in his shower. I advised that he needs to follow up with Dr. Dimas Aguas for his MG so we can know how to proceed with beta blocker therapy for him. I will fax a copy of his myoview to Dr. Jeannette How office per the patient's request.

## 2011-06-24 ENCOUNTER — Telehealth: Payer: Self-pay | Admitting: Internal Medicine

## 2011-06-24 NOTE — Telephone Encounter (Signed)
I will forward to Dr. Graciela Husbands to see if he has any recommendations for a cardiologist in [facility]. I will call Liborio Nixon tomorrow at Dr. Jeannette How office [phone] after I get this.

## 2011-06-24 NOTE — Telephone Encounter (Signed)
New msg He is seeing dr Dimas Aguas in [place].  He said he needs a referral for cardiologist there.   He also needs his ekgs sent there to dr Dimas Aguas. Please call back

## 2011-06-29 NOTE — Telephone Encounter (Signed)
i dont have recommendation for cardiologist at [facility]  I would suggest that Dr Celso Sickle refer him to whomever he feels most comfortable and we will look forward to hearing from their concerted opinion

## 2011-07-01 NOTE — Telephone Encounter (Signed)
I left a message for [name] to call.

## 2011-07-02 ENCOUNTER — Telehealth: Payer: Self-pay | Admitting: Internal Medicine

## 2011-07-02 NOTE — Telephone Encounter (Signed)
Error

## 2011-07-02 NOTE — Telephone Encounter (Signed)
I received a call from Vina at Dr. Jeannette How office. She stated that I should call cardiology at [phone]- this is the [facility] at [facility]. Sherri Rad, RN, BSN  Call placed to cardiology at the number [place] gave me. I spoke with Ivin Booty there, he gave me a fax # of [phone] to fax records & an order for the referral. He also stated that the referral line phone # if we need it is [phone]. I did fax the patient's last 3 office notes with Dr. Graciela Husbands, his last 2 EKG's, recent myoview, last echo report, copy of his demographics and an order to the above fax #. Per Ivin Booty, the records will be reviewed and the patient will be contacted by phone with an appointment. If he cannot be reached by phone, they will mail the appointment to him. I called the patient and left a message for him that this has been done. Sherri Rad, RN, BSN

## 2011-07-07 ENCOUNTER — Other Ambulatory Visit (INDEPENDENT_AMBULATORY_CARE_PROVIDER_SITE_OTHER): Payer: Medicare Other | Admitting: *Deleted

## 2011-07-07 DIAGNOSIS — G4733 Obstructive sleep apnea (adult) (pediatric): Secondary | ICD-10-CM

## 2011-07-07 DIAGNOSIS — I498 Other specified cardiac arrhythmias: Secondary | ICD-10-CM

## 2011-07-07 DIAGNOSIS — R000 Tachycardia, unspecified: Secondary | ICD-10-CM

## 2011-07-07 DIAGNOSIS — R609 Edema, unspecified: Secondary | ICD-10-CM

## 2011-07-07 LAB — BASIC METABOLIC PANEL
CO2: 31 mEq/L (ref 19–32)
Calcium: 8.7 mg/dL (ref 8.4–10.5)
Creatinine, Ser: 0.9 mg/dL (ref 0.4–1.5)
Glucose, Bld: 175 mg/dL — ABNORMAL HIGH (ref 70–99)
Sodium: 139 mEq/L (ref 135–145)

## 2011-07-10 ENCOUNTER — Other Ambulatory Visit: Payer: Self-pay | Admitting: Internal Medicine

## 2011-07-10 ENCOUNTER — Telehealth: Payer: Self-pay | Admitting: *Deleted

## 2011-07-10 DIAGNOSIS — R609 Edema, unspecified: Secondary | ICD-10-CM

## 2011-07-10 DIAGNOSIS — G4733 Obstructive sleep apnea (adult) (pediatric): Secondary | ICD-10-CM

## 2011-07-10 DIAGNOSIS — R000 Tachycardia, unspecified: Secondary | ICD-10-CM

## 2011-07-10 DIAGNOSIS — I498 Other specified cardiac arrhythmias: Secondary | ICD-10-CM

## 2011-07-10 DIAGNOSIS — R600 Localized edema: Secondary | ICD-10-CM

## 2011-07-10 NOTE — Telephone Encounter (Signed)
The patient called this morning with complaints of continued weight gain, lower extremity pitting edema, fullness around the abdomen, and SOB. He feels his urine output is the same. He has been weighing, but not daily. His weight today was 220 lbs. He weighed yesterday in the late afternoon and he was 228 lbs. He was 218 lbs at his last office visit with Dr. Graciela Husbands in November 2012. He is currently on lasix 20 mg two tablets twice daily and potassium 20 meq one tablet daily. He is watching his sodium intake very closely. He is to follow up with Dr. Dimas Aguas on Monday 2/4 and see [facility] cardiology on 2/12. I explained I would review with Dr. Graciela Husbands and call him back. He is agreeable. Sherri Rad, RN, BSN  I spoke with Dr. Graciela Husbands. He recommends lasix 80 mg in the morning and 40 mg in the afternoon. We will add an additional K+ 20 meq tablet as well (total 40 meq daily). Sherri Rad, RN, BSN

## 2011-07-10 NOTE — Telephone Encounter (Signed)
Pt wants furosemide called in today if possible

## 2011-07-10 NOTE — Telephone Encounter (Signed)
Patient didn't answer when i called to verify dosage. Will refill monday

## 2011-07-10 NOTE — Telephone Encounter (Signed)
F/U  Patient returning nurse call concerning his meds, he is very angry!!  He is leaving the house shortly and would like a call on his hm#.  Patient states his answering machine is full, so the message will not record. Patient state its Friday and he cant breath and she has some answers.

## 2011-07-10 NOTE — Telephone Encounter (Signed)
I left a message of Dr. Odessa Fleming recommendations on the patient's voice mail.

## 2011-07-14 ENCOUNTER — Other Ambulatory Visit: Payer: Self-pay | Admitting: *Deleted

## 2011-07-14 DIAGNOSIS — R000 Tachycardia, unspecified: Secondary | ICD-10-CM

## 2011-07-14 DIAGNOSIS — R609 Edema, unspecified: Secondary | ICD-10-CM

## 2011-07-14 DIAGNOSIS — I498 Other specified cardiac arrhythmias: Secondary | ICD-10-CM

## 2011-07-14 DIAGNOSIS — G4733 Obstructive sleep apnea (adult) (pediatric): Secondary | ICD-10-CM

## 2011-07-14 MED ORDER — FUROSEMIDE 40 MG PO TABS: ORAL_TABLET | ORAL | Status: DC

## 2011-07-20 NOTE — Telephone Encounter (Signed)
I spoke with the patient. He saw Dr. Dimas Aguas at [facility] last week and will have further testing for him tomorrow. He will also see [facility] cardiology tomorrow. He will call me back on Wednesday and let me know what was discussed at that visit. Per Dr. Dimas Aguas, the patient states he can go back on the cardiac med he took in the hospital, which I believe was metoprolol, and let Dr. Graciela Husbands dose this. I explained that we will need to set him up for visit with Dr. Graciela Husbands after her has his testing and sees cardiology at [facility]  He is agreeable.

## 2011-07-20 NOTE — Telephone Encounter (Signed)
New Msg: Pt calling wanting to speak with Heather. Please return pt call

## 2011-07-21 ENCOUNTER — Encounter: Payer: Self-pay | Admitting: Internal Medicine

## 2011-07-22 ENCOUNTER — Telehealth: Payer: Self-pay | Admitting: Internal Medicine

## 2011-07-22 NOTE — Telephone Encounter (Signed)
Pt calling per your request to set up an appt with Dr. Graciela Husbands

## 2011-07-22 NOTE — Telephone Encounter (Signed)
I left a message for the patient I will be out until next Tuesday, but to please call me back then.

## 2011-07-28 NOTE — Telephone Encounter (Signed)
I left a message for the patient I will call him back tomorrow. 

## 2011-07-29 NOTE — Telephone Encounter (Signed)
I spoke with the patient. He is aware that Dr. Graciela Husbands received office notes from [facility] cardiology. He is also aware that Dr. Graciela Husbands has reviewed these and is agreeable with the plan of care they have recommended. He is scheduled for an echo on 08/11/11. He will proceed with starting toprol 12.5 mg once daily. The plan will be for [facility] to follow him for the time being.

## 2011-07-29 NOTE — Telephone Encounter (Signed)
F/U  Patient returning nurse HM#, he has already seen Dr. Dimas Aguas. He can be reached at hm# [phone]

## 2011-08-06 ENCOUNTER — Telehealth: Payer: Self-pay | Admitting: *Deleted

## 2011-08-06 ENCOUNTER — Telehealth: Payer: Self-pay | Admitting: Internal Medicine

## 2011-08-06 DIAGNOSIS — R000 Tachycardia, unspecified: Secondary | ICD-10-CM

## 2011-08-06 NOTE — Telephone Encounter (Signed)
Error

## 2011-08-06 NOTE — Telephone Encounter (Signed)
The patient called today stating that Toprol 12.5 mg daily is not controlling his tachycardia. His HR's are still 98-110 bpm. He would like to switch to short acting metoprolol. I advised that he follow up with [facility] cardiology about this because they initially prescribed the toprol. He states she doesn't know who he saw and doesn't know how to get in touch with them. I explained I would review with Dr. Graciela Husbands and call him back. Sherri Rad, RN, BSN   Per Dr. Graciela Husbands, he will switch the patient over to metoprolol 25 mg one tablet twice daily, but wants the patient to have [facility] cardiology follow him right now due to his Myasthenia Gravis that he is being followed for there as well. I have attempted to call the patient and his number is ringing busy. I will call him back tomorrow. Sherri Rad, RN, BSN

## 2011-08-07 MED ORDER — METOPROLOL TARTRATE 25 MG PO TABS: 25.0000 mg | ORAL_TABLET | Freq: Two times a day (BID) | ORAL | Status: DC

## 2011-08-07 NOTE — Telephone Encounter (Signed)
I left a message for the patient I would be sending in a RX for metoprolol tartrate 25 mg BID per Dr. Graciela Husbands. I advised if he feels this dose is too strong, he could start out taking 1/2 tablet BID. I advised that he keep regular follow up with [facility] cardiology at this time per Dr. Graciela Husbands due to his MG and that they can communicate with Dr. Dimas Aguas easier than we can.

## 2011-09-12 ENCOUNTER — Emergency Department (HOSPITAL_COMMUNITY)
Admission: EM | Admit: 2011-09-12 | Discharge: 2011-09-12 | Disposition: A | Discharge status: Home / Self Care | Payer: Medicare Other | Attending: Emergency Medicine | Admitting: Emergency Medicine

## 2011-09-12 ENCOUNTER — Encounter (HOSPITAL_COMMUNITY): Payer: Self-pay | Admitting: *Deleted

## 2011-09-12 DIAGNOSIS — L03119 Cellulitis of unspecified part of limb: Secondary | ICD-10-CM

## 2011-09-12 DIAGNOSIS — F988 Other specified behavioral and emotional disorders with onset usually occurring in childhood and adolescence: Secondary | ICD-10-CM | POA: Insufficient documentation

## 2011-09-12 DIAGNOSIS — E785 Hyperlipidemia, unspecified: Secondary | ICD-10-CM | POA: Insufficient documentation

## 2011-09-12 DIAGNOSIS — IMO0002 Reserved for concepts with insufficient information to code with codable children: Secondary | ICD-10-CM | POA: Insufficient documentation

## 2011-09-12 DIAGNOSIS — E119 Type 2 diabetes mellitus without complications: Secondary | ICD-10-CM | POA: Insufficient documentation

## 2011-09-12 DIAGNOSIS — Z87891 Personal history of nicotine dependence: Secondary | ICD-10-CM | POA: Insufficient documentation

## 2011-09-12 MED ORDER — CLINDAMYCIN HCL 300 MG PO CAPS: 450.0000 mg | ORAL_CAPSULE | Freq: Once | ORAL | Status: DC

## 2011-09-12 MED ORDER — CLINDAMYCIN HCL 300 MG PO CAPS
450.0000 mg | ORAL_CAPSULE | ORAL | Status: AC
  Administered 2011-09-12: 450 mg via ORAL
  Filled 2011-09-12: qty 1

## 2011-09-12 MED ORDER — CLINDAMYCIN HCL 150 MG PO CAPS: 450.0000 mg | ORAL_CAPSULE | Freq: Three times a day (TID) | ORAL | Status: AC

## 2011-09-12 NOTE — ED Notes (Signed)
To ed for eval of left elbow redness and swelling. States he had a sore there that he has been picking at. ROM wnl. Pt is a heart pt and having an ablation next week at [facility]

## 2011-09-12 NOTE — Discharge Instructions (Signed)

## 2011-09-12 NOTE — ED Provider Notes (Addendum)
History     CSN: [id number]  Arrival date & time 09/12/11  1045   First MD Initiated Contact with Patient 09/12/11 1141      Chief Complaint  Patient presents with  . Abscess    (Consider location/radiation/quality/duration/timing/severity/associated sxs/prior treatment) HPI Comments: Pt presents with eczema to both elbows.  He had been picking at the skin to his L elbow and it became scabbed.  He was trying to treat it with antibiotic ointment but has noticed increasing redness, swelling to the site.  No fevers.  No drainage.  Scab is present now.    Patient is a 66 y.o. male presenting with abscess. The history is provided by the patient. No language interpreter was used.  Abscess  This is a new problem. The onset was gradual. The problem has been gradually worsening. The abscess is present on the left arm. The problem is moderate. The abscess is characterized by redness and swelling. The abscess first occurred at home. Pertinent negatives include no fever, no vomiting and no cough.    Past Medical History  Diagnosis Date  . Sinus tachycardia 2006    Cardiac Cath unremarkable  . Right bundle branch block and left posterior fascicular block   . Orthostatic hypotension   . Unspecified essential hypertension   . Hyperlipidemia   . DM (diabetes mellitus)   . ADD (attention deficit disorder)   . Decreased libido   . 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 Cervical spondylosis with radiculopathy   . Depression   . Syrinx     T5-T6  . Cord compression     Cord compression syndrome C5-C6, C6-C7    Past Surgical History  Procedure Date  . Thymectomy april 2012    Family History  Problem Relation Age of Onset  . Breast cancer Mother   . Other Father     Beck's Sarcoid    History  Substance Use Topics  . Smoking status: Former Smoker -- 2.5 packs/day for 30 years    Quit date: 06/09/1991  . Smokeless tobacco: Never Used  . Alcohol Use: No       Review of Systems  Constitutional: Negative.  Negative for fever and chills.  HENT: Negative.   Eyes: Negative.  Negative for discharge and redness.  Respiratory: Negative.  Negative for cough and shortness of breath.   Cardiovascular: Negative.  Negative for chest pain.  Gastrointestinal: Negative.  Negative for nausea, vomiting and abdominal pain.  Genitourinary: Negative.  Negative for hematuria.  Musculoskeletal: Positive for joint swelling. Negative for back pain.  Skin: Positive for color change. Negative for rash.  Neurological: Negative for syncope and headaches.  Hematological: Negative.  Negative for adenopathy.  Psychiatric/Behavioral: Negative.  Negative for confusion.  All other systems reviewed and are negative.    Allergies  Aminoglycosides; Beta adrenergic blockers; Calcium channel blockers; Iodinated diagnostic agents; Metoprolol; Neuromuscular blocking agents; Other; Penicillins; and Quinine derivatives  Home Medications   Current Outpatient Rx  Name Route Sig Dispense Refill  . B COMPLEX-C PO TABS Oral Take 1 tablet by mouth daily.     Marland Kitchen BENAZEPRIL HCL 40 MG PO TABS Oral Take 40 mg by mouth daily. For blood pressure    . FUROSEMIDE 40 MG PO TABS Oral Take 80 mg by mouth 2 (two) times daily. Take 80 mg in the morning and 40 mg in the afternoon.    Marland Kitchen GLIPIZIDE 10 MG PO TABS Oral Take 10 mg  by mouth 2 (two) times daily before a meal. For diabetes.    Marland Kitchen METFORMIN HCL 1000 MG PO TABS Oral Take 1,000 mg by mouth 2 (two) times daily.    Marland Kitchen POTASSIUM CHLORIDE CRYS ER 20 MEQ PO TBCR Oral Take 1 tablet (20 mEq total) by mouth 2 (two) times daily.    Marland Kitchen PREDNISONE 5 MG PO TABS Oral Take 15 mg by mouth daily.    Marland Kitchen PYRIDOSTIGMINE BROMIDE 60 MG PO TABS  30 mg as directed. Up to 5 times daily as needed.    Marland Kitchen ZOLPIDEM TARTRATE 10 MG PO TABS Oral Take 10 mg by mouth at bedtime.       BP 153/90  Pulse 105  Temp(Src) 98.1 F (36.7 C) (Oral)  Resp 20  SpO2  97%  Physical Exam  Nursing note and vitals reviewed. Constitutional: He is oriented to person, place, and time. He appears well-developed and well-nourished.  Non-toxic appearance. He does not have a sickly appearance.  HENT:  Head: Normocephalic and atraumatic.  Eyes: Conjunctivae, EOM and lids are normal. Pupils are equal, round, and reactive to light.  Neck: Trachea normal, normal range of motion and full passive range of motion without pain. Neck supple.  Cardiovascular: Normal rate.   Pulmonary/Chest: Effort normal.  Abdominal: Normal appearance. There is no CVA tenderness.  Musculoskeletal: Normal range of motion.  Neurological: He is alert and oriented to person, place, and time. He has normal strength.  Skin: Skin is warm, dry and intact. No rash noted. There is erythema.       L elbow swelling, redness, mild tenderness to area, scab at central site, no crepitus, can flex and extend elbow with ease.  No fluctuance to elbow.    Psychiatric: He has a normal mood and affect. His behavior is normal. Judgment and thought content normal.    ED Course  Procedures (including critical care time)  Labs Reviewed - No data to display No results found.   No diagnosis found.    MDM  Patient with cellulitis to his left elbow.  He can flex and extend his joint with ease I do not believe he is septic arthritis at this point in time.  I did do a ultrasound of the patient's elbow to look for any fluid collections and did not see any site he not play the patient has an underlying abscess that requires drainage at this time.  I will place the patient on antibiotics as an outpatient and have him followup with his primary care physician.        Nat Christen, MD 09/12/11 1200  I did discuss antibiotics with the pharmacist John.  Clindamycin does not appear to have any cross-reactivity with the patient's Mestinon or cause problems with myasthenia gravis.    Nat Christen,  MD 09/12/11 [phone]

## 2011-09-17 ENCOUNTER — Encounter: Payer: Self-pay | Admitting: Cardiology

## 2011-09-18 ENCOUNTER — Telehealth: Payer: Self-pay | Admitting: Internal Medicine

## 2011-09-18 NOTE — Telephone Encounter (Signed)
New msg Pt wants to talk to you  And didn't want a call back . He will call you back

## 2011-09-18 NOTE — Telephone Encounter (Signed)
I spoke with the patient. He wanted to update Dr. Graciela Husbands on what has been going on with him. He reports having a negative heart cath recently and that he had an ablation yesterday at [facility]. He states his HR's have been in the 80's today. I will forward to Dr. Graciela Husbands as an FYI only.

## 2011-09-18 NOTE — Telephone Encounter (Signed)
I attempted to call the patient. His number is ringing busy x 2 tries.

## 2011-09-18 NOTE — Telephone Encounter (Signed)
Follow-up:     Patient called in wanting to speak with you. Declined to list a reason. Please call back.

## 2011-09-22 ENCOUNTER — Other Ambulatory Visit: Payer: Self-pay | Admitting: *Deleted

## 2011-09-22 MED ORDER — ZOLPIDEM TARTRATE 10 MG PO TABS: 10.0000 mg | ORAL_TABLET | Freq: Every day | ORAL | Status: DC

## 2011-09-22 NOTE — Telephone Encounter (Signed)
Pt requesting 5 refills per [facility].

## 2011-09-22 NOTE — Telephone Encounter (Signed)
[name] states we are still his primary care. He does not want to make an appt until Sept of this year.  He is requesting 5 refills on the Ambien. He states he was recently in the ED and seen at [facility] and we can "pull up the information" and this is why he does not need an appt any time  Soon.

## 2011-09-22 NOTE — Telephone Encounter (Signed)
Can you please call [name] and see if he still intends to see [place] as his primary care because there was some question if he was gonna start going to see the doctors at [facility] for his primary care.  I will refill it for 1 refill for now.  If he is going to continue to come to [place] please see if we can get him an appointment sometime soon since he hasn't been seen by [place] since September.

## 2011-09-23 NOTE — Telephone Encounter (Signed)
We can't pull up the [facility] records and he has several follow up issues from his ER stay as well as his last appointment that need to be followed.  I will not refill for more then what I did until he makes a follow up appointment.

## 2011-09-24 NOTE — Telephone Encounter (Signed)
Ambien rx called to [facility]. Pt was called again; no answer- left message to call the clinic to schedule f/u appt before Sept. Also left message Ambien was called to his pharmacy w/1 refill. And if he has any questions to the clinic.

## 2011-09-28 ENCOUNTER — Telehealth: Payer: Self-pay | Admitting: Internal Medicine

## 2011-09-28 DIAGNOSIS — R609 Edema, unspecified: Secondary | ICD-10-CM

## 2011-09-28 NOTE — Telephone Encounter (Signed)
Pt requesting lab work due to his lasix, pls call [phone]

## 2011-09-28 NOTE — Telephone Encounter (Signed)
Mr Coolman states his Lasix was increased to 120mg  bid.  He is requesting a bmet be checked.

## 2011-09-28 NOTE — Telephone Encounter (Signed)
THAT WOULD BE FINE

## 2011-09-29 NOTE — Telephone Encounter (Signed)
Addended by: Sherri Rad C on: 09/29/2011 04:05 PM   Modules accepted: Orders

## 2011-09-29 NOTE — Telephone Encounter (Signed)
The patient is aware we can check his bmp. He will come in tomorrow.

## 2011-09-30 ENCOUNTER — Other Ambulatory Visit (INDEPENDENT_AMBULATORY_CARE_PROVIDER_SITE_OTHER): Payer: Medicare Other

## 2011-09-30 DIAGNOSIS — R609 Edema, unspecified: Secondary | ICD-10-CM

## 2011-09-30 LAB — BASIC METABOLIC PANEL
BUN: 14 mg/dL (ref 6–23)
CO2: 31 mEq/L (ref 19–32)
Calcium: 8.8 mg/dL (ref 8.4–10.5)
Glucose, Bld: 487 mg/dL — ABNORMAL HIGH (ref 70–99)
Sodium: 137 mEq/L (ref 135–145)

## 2011-10-30 ENCOUNTER — Ambulatory Visit (INDEPENDENT_AMBULATORY_CARE_PROVIDER_SITE_OTHER): Payer: Medicare Other | Admitting: Internal Medicine

## 2011-10-30 VITALS — BP 164/84 | HR 68 | Temp 97.5°F | Ht 67.0 in | Wt 225.3 lb

## 2011-10-30 DIAGNOSIS — H6122 Impacted cerumen, left ear: Secondary | ICD-10-CM | POA: Insufficient documentation

## 2011-10-30 DIAGNOSIS — I5022 Chronic systolic (congestive) heart failure: Secondary | ICD-10-CM

## 2011-10-30 DIAGNOSIS — R1013 Epigastric pain: Secondary | ICD-10-CM | POA: Insufficient documentation

## 2011-10-30 DIAGNOSIS — G4700 Insomnia, unspecified: Secondary | ICD-10-CM

## 2011-10-30 DIAGNOSIS — E119 Type 2 diabetes mellitus without complications: Secondary | ICD-10-CM

## 2011-10-30 DIAGNOSIS — H6120 Impacted cerumen, unspecified ear: Secondary | ICD-10-CM

## 2011-10-30 DIAGNOSIS — I5032 Chronic diastolic (congestive) heart failure: Secondary | ICD-10-CM | POA: Insufficient documentation

## 2011-10-30 DIAGNOSIS — R109 Unspecified abdominal pain: Secondary | ICD-10-CM

## 2011-10-30 LAB — POCT GLYCOSYLATED HEMOGLOBIN (HGB A1C): Hemoglobin A1C: 14

## 2011-10-30 LAB — GLUCOSE, CAPILLARY: Glucose-Capillary: 331 mg/dL — ABNORMAL HIGH (ref 70–99)

## 2011-10-30 MED ORDER — FUROSEMIDE 40 MG PO TABS: 120.0000 mg | ORAL_TABLET | Freq: Every day | ORAL | Status: DC

## 2011-10-30 MED ORDER — ZOLPIDEM TARTRATE 10 MG PO TABS: 10.0000 mg | ORAL_TABLET | Freq: Every day | ORAL | Status: DC

## 2011-10-30 NOTE — Patient Instructions (Addendum)
1.  Take the Lasix 120 mg daily for the swelling.  2.  Come back next week on Wednesday at 8:45 to see Dr. Baltazar Apo and get the CT of the abdomen done.   3.  When you are off the prednisone please call and I will send in the prescription for the Lantus.

## 2011-10-30 NOTE — Progress Notes (Signed)
Subjective:   Patient ID: [name] male   DOB: 1945/07/15 66 y.o.   MRN: [id number]  HPI: [name] is a 66 y.o. man here for follow up from his last visit.  He has had several events since the last time I saw him.  He was evaluated by a cardiologist at [facility] and underwent a cardiac catheterization as well as a cardiac ablation for his tachycardia.  He states that his cath was "completely clean, which is a miracle."  He states that he was told there that he needed to get better control of his diabetes.    He also has continued to follow with Dr. Clarisa Kindred for his myasthenia gravis.  He has been on Prednisone and mestinon for over a year at this point.  He states that he will soon be starting Cellcept to try to be able to taper off him of the long term prednisone that he has been on for sometime.  He states that he will be starting the Cellcept soon.    He states that he has lost his appetite over the last 3 months.  He states that nothing seems appetizing and that when he does eat something he feels full very quickly afterwards.  He states that he knows that he has to take his medications with food or he has stomach pain.  He denies nausea, weight loss, fevers, chills, night sweats, blood in the stool, or vomiting.  He states that over the last 3 months he has had a "band of pain" across his lower abdomen.  It is an occasional sharp pain that he attributes to taking his medications without food.  He also states that over the last month he has had a worsening of the pain in the RLQ.  This pain is a sharp pain that seems to get better when he has a bowel movement.  He has had 3 watery bowel movements daily for that month.  He has been "swigging" imodium 3-4 times daily for that months time.    He also states that he has not been taking his Lasix, metformin, or his glipizide for many months.  He has noticed an increase in the swelling in his legs as well as some mild increase in the girth of  his stomach.  He denies any orthopnea but does state that he has been using his electric wheelchair more often to conserve energy.    He also states that he takes his Benazepril occasionally when he remembers but admits that it is more days then not that he forgets.   He also states that he had a slowly progressive loss of hearing in his left ear over the last few weeks.  He thinks there is wax in there.  He denies ear pain, drainage from the ear, or using q tips to try to clean it.   Past Medical History  Diagnosis Date  . Sinus tachycardia 2006    Cardiac Cath unremarkable  . Right bundle branch block and left posterior fascicular block   . Orthostatic hypotension   . Unspecified essential hypertension   . Hyperlipidemia   . DM (diabetes mellitus)   . ADD (attention deficit disorder)   . Decreased libido   . 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 Cervical spondylosis with radiculopathy   . Depression   . Syrinx     T5-T6  . Cord compression     Cord compression syndrome C5-C6,  C6-C7   Current Outpatient Prescriptions  Medication Sig Dispense Refill  . B Complex-C (B-COMPLEX WITH VITAMIN C) tablet Take 1 tablet by mouth daily.       . benazepril (LOTENSIN) 40 MG tablet Take 40 mg by mouth daily. For blood pressure      . furosemide (LASIX) 40 MG tablet Take 80 mg by mouth 2 (two) times daily. Take 80 mg in the morning and 40 mg in the afternoon.      Marland Kitchen glipiZIDE (GLUCOTROL) 10 MG tablet Take 10 mg by mouth 2 (two) times daily before a meal. For diabetes.      . metFORMIN (GLUCOPHAGE) 1000 MG tablet Take 1,000 mg by mouth 2 (two) times daily.      . potassium chloride SA (K-DUR,KLOR-CON) 20 MEQ tablet Take 1 tablet (20 mEq total) by mouth 2 (two) times daily.      . predniSONE (DELTASONE) 5 MG tablet Take 15 mg by mouth daily.      Marland Kitchen pyridostigmine (MESTINON) 60 MG tablet 30 mg as directed. Up to 5 times daily as needed.      . zolpidem (AMBIEN) 10 MG tablet  Take 1 tablet (10 mg total) by mouth at bedtime.  30 tablet  1   Family History  Problem Relation Age of Onset  . Breast cancer Mother   . Other Father     Beck's Sarcoid   History   Social History  . Marital Status: Single    Spouse Name: N/A    Number of Children: N/A  . Years of Education: N/A   Occupational History  . Retired     Surveyor, minerals   Social History Main Topics  . Smoking status: Former Smoker -- 2.5 packs/day for 30 years    Quit date: 06/09/1991  . Smokeless tobacco: Never Used  . Alcohol Use: No  . Drug Use: No  . Sexually Active: Not on file   Other Topics Concern  . Not on file   Social History Narrative   Has a single man's lifestyle and will remind anyone of it when asked.Prefers to see only male physicians   Review of Systems: Negative except as noted in the HPI.   Objective:  Physical Exam: Filed Vitals:   10/30/11 1527  BP: 164/84  Pulse: 68  Temp: 97.5 F (36.4 C)  TempSrc: Oral  Height: 5\' 7"  (1.702 m)  Weight: 225 lb 4.8 oz (102.195 kg)   Constitutional: Vital signs reviewed.  Patient is a well-developed and well-nourished elderly man in no acute distress and cooperative with exam. Alert and oriented x3.  Head: Normocephalic and atraumatic Ear: Cerumen impaction noted on the left ear, unable to visualize the left TM.  Right TM clear.  Mouth: no erythema or exudates, MMM Eyes: PERRL, EOMI, conjunctivae normal, No scleral icterus.  Neck: Supple, Trachea midline normal ROM, No JVD, mass, thyromegaly, or carotid bruit present.  Cardiovascular: JVD to the jaw sitting upright.  RRR, S1 normal, S2 normal, no MRG, pulses symmetric and intact bilaterally. 2+ pitting edema to the knees bilaterally.  Pulmonary/Chest: CTAB, no wheezes, rales, or rhonchi Abdominal: Soft. Mildly distended, moderate tenderness to palpation in the RLQ and mild tenderness to palpation over the LLQ and periumbilical.  Mild pain to percussion over the RLQ.  bowel sounds  are hyperactive, no masses, organomegaly, or guarding present.  GU: no CVA tenderness Musculoskeletal: No joint deformities, erythema, or stiffness, ROM full and no nontender Hematology: no cervical, inginal, or axillary adenopathy.  Neurological: A&O x3, Strength is 4+/5 in RUE, LUE, and 4/5 in the RLE and LLE. cranial nerve II-XII are grossly intact, no focal motor deficit, sensory intact to light touch bilaterally.  Skin: Warm, dry and intact. No rash, cyanosis, or clubbing.  Psychiatric: Normal mood and affect. speech and behavior is normal. Judgment and thought content normal. Cognition and memory are normal. insight is poor   Greater then 75 minutes spent with >50% of that time spent in direct patient counseling and history taking.   Assessment & Plan:

## 2011-11-01 NOTE — Assessment & Plan Note (Signed)
He continues to struggle with insomnia but has been taking his [place] with good effect.  We will continue this at the current dose for now.

## 2011-11-01 NOTE — Assessment & Plan Note (Signed)
Per his report his Cath showed that he had an EF of 40%.  He also has signs of subacute volume overload today on exam with increased JVD as well as increased swelling in his legs.  He has not been taking his lasix.  We will start him back on 120 mg daily for now and check a Bmet when he comes back on Wednesday for his scan to follow his creatinine and adjust his diuretics from there.  He was encouraged to continue to follow a low salt diet.

## 2011-11-01 NOTE — Assessment & Plan Note (Signed)
Lab Results  Component Value Date   HGBA1C 14.0 10/30/2011   HGBA1C 13.9* 01/29/2011   HGBA1C 10.3 04/28/2010   Lab Results  Component Value Date   MICROALBUR 8.09* 10/29/2009   LDLCALC Comment:   Not calculated due to Triglyceride >400. Suggest ordering Direct LDL (Unit Code: 40981).   Total Cholesterol/HDL Ratio:CHD Risk                        Coronary Heart Disease Risk Table                                        Men       Women          1/2 Average Risk              3.4        3.3              Average Risk              5.0        4.4           2X Average Risk              9.6        7.1           3X Average Risk             23.4       11.0 Use the calculated Patient Ratio above and the CHD Risk table  to determine the patient's CHD Risk. ATP III Classification (LDL):       < 100        mg/dL         Optimal      191 - 129     mg/dL         Near or Above Optimal      130 - 159     mg/dL         Borderline High      160 - 189     mg/dL         High       > 478        mg/dL         Very High   2/95/6213   CREATININE 1.0 09/30/2011   A1C continues to exhibit horrible control of his blood sugar.  It is a miracle if his cath is truly negative completely.  We had a long discussion again today about blood sugar control and the consequences of not doing better (Greater then 40 minutes).  He states that he is willing to try insulin because he thinks its time to get better control and is willing to try whichever insulin that I think would be best because "Hey doc I trust you."  He, however, states that he would like to get started on the Cellcept first and tapered off the Prednisone before he does start the medication.  He has not been taking his metformin and doesn't think that he can tolerate it with his current abdominal pain.  We discussed restarting glipizide but he states that he will start the insulin soon.  He continues to believe that his body does not function without at least 250 mg/dl of blood sugar.   The plans when he is willing to start  the insulin is Lantus at 25 units every night and titrate from there.  We will likely have to go slow on the titration because if he see his blood sugars drop below 250 too quickly he will stop taking the medication.

## 2011-11-01 NOTE — Assessment & Plan Note (Signed)
He has several reasons for lower abdominal pain including medication side effect, GI complications of his myasthenia, diabetic gastroparesis, constipation (from over use of Imodium), gastroenteritis, Diverticulitis, or even colon cancer since it doesn't appear that he has ever been screened.  My initial plan was to admit him to the hospital overnight to have a CT scan of his abdomen and pelvis with oral contrast but he does not want to be admitted today.  We discussed the risks of delaying diagnosis and he states that if it does get worse or he see warning signs such as blood in the stool, increased pain, fevers, or nausea/vomiting that he will come to the ER over the weekend.  We will have him return on Wednesday to see Dr. Baltazar Apo to get a CT of his abdomen and pelvis with oral contrast.    I will call Dr. Dimas Aguas at [facility] on Tuesday about the administration of IV contrast in patients with MG.  I discussed the case with Dr. Manson Passey of Radiology and she suggested just doing the study with oral contrast only and that we could redo it with IV contrast if we felt we needed to after.

## 2011-11-01 NOTE — Assessment & Plan Note (Signed)
He has Cerumen impaction in the left ear so that I can't visualize the left TM.  We discussed OTC Debrox drops to soften it and we will work to get him over to ENT to get them cleaned out.

## 2011-11-03 ENCOUNTER — Telehealth: Payer: Self-pay | Admitting: *Deleted

## 2011-11-03 NOTE — Progress Notes (Signed)
Addended by: Leodis Sias on: 11/03/2011 12:44 PM   Modules accepted: Orders

## 2011-11-03 NOTE — Telephone Encounter (Signed)
CALLED [name], LEFT VOICE MESSAGE FOR HIM TO CALL BACK TO [facility] . PATIENT NEEDS TO COME IN TO [facility] FOR 8:45AM FOR BLOOD WORK HERE IN THE LAB. THEN TO XRAY TO REGISTER  9:15AM / CONTRAST 9:30 AM / CT @ 11:30AM . Danny Ala HAS APPT WITH DR Baltazar Apo IN [facility].  [name] NT 5-28-013  2:00PM

## 2011-11-03 NOTE — Progress Notes (Signed)
Addended by: Leodis Sias on: 11/03/2011 12:40 PM   Modules accepted: Orders

## 2011-11-04 ENCOUNTER — Encounter: Payer: Medicare Other | Admitting: Internal Medicine

## 2011-11-04 ENCOUNTER — Ambulatory Visit (INDEPENDENT_AMBULATORY_CARE_PROVIDER_SITE_OTHER): Payer: Medicare Other | Admitting: Internal Medicine

## 2011-11-04 ENCOUNTER — Other Ambulatory Visit: Payer: Medicare Other

## 2011-11-04 ENCOUNTER — Encounter: Payer: Self-pay | Admitting: Internal Medicine

## 2011-11-04 ENCOUNTER — Ambulatory Visit (HOSPITAL_COMMUNITY)
Admission: RE | Admit: 2011-11-04 | Discharge: 2011-11-04 | Disposition: A | Discharge status: Home / Self Care | Payer: Medicare Other | Source: Ambulatory Visit | Attending: Internal Medicine | Admitting: Internal Medicine

## 2011-11-04 DIAGNOSIS — R109 Unspecified abdominal pain: Secondary | ICD-10-CM

## 2011-11-04 DIAGNOSIS — E119 Type 2 diabetes mellitus without complications: Secondary | ICD-10-CM

## 2011-11-04 DIAGNOSIS — I5022 Chronic systolic (congestive) heart failure: Secondary | ICD-10-CM

## 2011-11-04 DIAGNOSIS — J90 Pleural effusion, not elsewhere classified: Secondary | ICD-10-CM | POA: Insufficient documentation

## 2011-11-04 DIAGNOSIS — R188 Other ascites: Secondary | ICD-10-CM | POA: Insufficient documentation

## 2011-11-04 DIAGNOSIS — K8020 Calculus of gallbladder without cholecystitis without obstruction: Secondary | ICD-10-CM | POA: Insufficient documentation

## 2011-11-04 DIAGNOSIS — R630 Anorexia: Secondary | ICD-10-CM | POA: Insufficient documentation

## 2011-11-04 DIAGNOSIS — R1031 Right lower quadrant pain: Secondary | ICD-10-CM | POA: Insufficient documentation

## 2011-11-04 DIAGNOSIS — G7000 Myasthenia gravis without (acute) exacerbation: Secondary | ICD-10-CM

## 2011-11-04 DIAGNOSIS — I10 Essential (primary) hypertension: Secondary | ICD-10-CM

## 2011-11-04 LAB — COMPREHENSIVE METABOLIC PANEL
AST: 12 U/L (ref 0–37)
Albumin: 2 g/dL — ABNORMAL LOW (ref 3.5–5.2)
BUN: 14 mg/dL (ref 6–23)
CO2: 33 mEq/L — ABNORMAL HIGH (ref 19–32)
Calcium: 8.8 mg/dL (ref 8.4–10.5)
Chloride: 101 mEq/L (ref 96–112)
Potassium: 3.7 mEq/L (ref 3.5–5.3)

## 2011-11-04 LAB — CBC WITH DIFFERENTIAL/PLATELET
Basophils Absolute: 0 10*3/uL (ref 0.0–0.1)
Basophils Relative: 0 % (ref 0–1)
Hemoglobin: 15.8 g/dL (ref 13.0–17.0)
MCHC: 34.1 g/dL (ref 30.0–36.0)
Monocytes Relative: 6 % (ref 3–12)
Neutro Abs: 12.1 10*3/uL — ABNORMAL HIGH (ref 1.7–7.7)
Neutrophils Relative %: 85 % — ABNORMAL HIGH (ref 43–77)
RDW: 13.6 % (ref 11.5–15.5)

## 2011-11-04 LAB — LIPID PANEL
HDL: 35 mg/dL — ABNORMAL LOW (ref >39)
LDL Cholesterol: 99 mg/dL (ref 0–99)

## 2011-11-04 NOTE — Patient Instructions (Addendum)
Please follow up gastroenterologist as scheduled.  Please follow up after you have seen the gastroenterologist in the outpatient clinic with Dr. Tonny Branch.  Please have a cup of plain yogurt once daily, no salt, no other flavors.

## 2011-11-05 NOTE — Progress Notes (Signed)
Subjective:     Patient ID: [name], male   DOB: Feb 21, 1946, 66 y.o.   MRN: [id number]  HPI [name] is a 66 y.o. Man with history of DM poorly controlled, HTN, HLD, Myasthenia gravis and systolic heart failure who is here for follow up from his last visit.    He also has continued to follow with Dr. Clarisa Kindred for his myasthenia gravis.  He has been on Prednisone and mestinon for over a year at this point.  He states that he will soon be starting Cellcept to try to be able to taper off him of the long term prednisone that he has been on for sometime.  He states that he will be starting the Cellcept soon.    At last office visit he complained of decreased appetite over the last 3 months.  He states that nothing seems appetizing and that when he does eat something he feels full very quickly afterwards.  He states that he knows that he has to take his medications with food or he has stomach pain.  He denies nausea, weight loss, fevers, chills, night sweats, blood in the stool, or vomiting.  He states that over the last 3 months he has had a "band of pain" across his lower abdomen.  It is an occasional sharp pain that he attributes to taking his medications without food.  He also states that over the last month he has had a worsening of the pain in the RLQ.  This pain is a sharp pain that seems to get better when he has a bowel movement.  He has had 3 watery bowel movements daily for that month.  He has been "swigging" imodium 3-4 times daily for that months time.    Today there is no change in abdominal pain today, and still accompanied by diarrhea, without blood. No fevers or chills.   Review of Systems  All other systems reviewed and are negative.       Objective:   Physical Exam  Constitutional: He appears well-developed.  HENT:  Head: Normocephalic.  Eyes: Pupils are equal, round, and reactive to light.  Neck: Normal range of motion.  Cardiovascular: Normal rate and regular  rhythm.   Pulmonary/Chest: Effort normal and breath sounds normal.  Abdominal:       Abdomen is mildly distended and tender to palpation RLQ to LLQ, bowel sounds normal, no masses palpated  Musculoskeletal: He exhibits edema.       Patient sitting in wheelchair

## 2011-11-05 NOTE — Assessment & Plan Note (Signed)
Lab Results  Component Value Date   NA 142 11/04/2011   K 3.7 11/04/2011   CL 101 11/04/2011   CO2 33* 11/04/2011   BUN 14 11/04/2011   CREATININE 0.88 11/04/2011   CREATININE 1.0 09/30/2011    BP Readings from Last 3 Encounters:  11/04/11 116/85  10/30/11 164/84  09/12/11 153/90    Assessment: Hypertension control:  controlled  Progress toward goals:  at goal Barriers to meeting goals:  no barriers identified  Plan: Hypertension treatment:  continue current medications

## 2011-11-05 NOTE — Assessment & Plan Note (Addendum)
Patient on prednisone and pyridostigmine, but plan is for patient to taper off prednisone and start cellcept. Have advised patient to go to GI for work up of colitis, before starting cellcept.

## 2011-11-05 NOTE — Assessment & Plan Note (Signed)
Back on Lasix. Wt Readings from Last 3 Encounters:  11/04/11 219 lb 3.2 oz (99.428 kg)  10/30/11 225 lb 4.8 oz (102.195 kg)  06/04/11 213 lb (96.616 kg)   Lost 6 lbs since last visit. Reports abdomen is not as tight, and legs are not as swollen.

## 2011-11-05 NOTE — Assessment & Plan Note (Signed)
CT abd/pelvis: 1. Inflammatory changes involving nearly the entire colon, consistent with colitis, possibly infectious or inflammatory in etiology.  2. Scattered moderate ascites.  3. Mild bilateral pleural effusions with collapse/consolidation in the right lower lobe.  4. Cholelithiasis.  These complaints have been chronic per patient. No fevers or chills, but white count is slightly elevated which may be in setting of chronic prednisone therapy and acute inflammation, rather than infectious colitis. Nevertheless I did review antibiotics, and fluoroquinolone family thought to exacerbate MG crisis. Flagyl would not, but not sure if it would be effective. Inflammatory colitis would not be unusual in patient with MG, and thus he needs further work-up from a gastroenterologist. In terms of abdominal pain, unfortunately cannot prescribe opiates in setting of myasthenia. Also instructed patient to taper off immodium as it is not indicated for long term use. GI referral made where he will like get colonoscopy for further work-up.

## 2011-11-13 ENCOUNTER — Encounter (HOSPITAL_COMMUNITY): Payer: Self-pay | Admitting: *Deleted

## 2011-11-13 ENCOUNTER — Ambulatory Visit (HOSPITAL_COMMUNITY)
Admission: RE | Admit: 2011-11-13 | Discharge: 2011-11-13 | Disposition: A | Discharge status: Home / Self Care | Payer: Medicare Other | Source: Ambulatory Visit | Attending: Gastroenterology | Admitting: Gastroenterology

## 2011-11-13 ENCOUNTER — Encounter (HOSPITAL_COMMUNITY)
Admission: RE | Disposition: A | Discharge status: Home / Self Care | Payer: Self-pay | Source: Ambulatory Visit | Attending: Gastroenterology

## 2011-11-13 DIAGNOSIS — R609 Edema, unspecified: Secondary | ICD-10-CM | POA: Insufficient documentation

## 2011-11-13 DIAGNOSIS — K5289 Other specified noninfective gastroenteritis and colitis: Secondary | ICD-10-CM | POA: Insufficient documentation

## 2011-11-13 DIAGNOSIS — K648 Other hemorrhoids: Secondary | ICD-10-CM | POA: Insufficient documentation

## 2011-11-13 DIAGNOSIS — Z79899 Other long term (current) drug therapy: Secondary | ICD-10-CM | POA: Insufficient documentation

## 2011-11-13 DIAGNOSIS — R933 Abnormal findings on diagnostic imaging of other parts of digestive tract: Secondary | ICD-10-CM | POA: Insufficient documentation

## 2011-11-13 DIAGNOSIS — IMO0002 Reserved for concepts with insufficient information to code with codable children: Secondary | ICD-10-CM | POA: Insufficient documentation

## 2011-11-13 DIAGNOSIS — G7000 Myasthenia gravis without (acute) exacerbation: Secondary | ICD-10-CM | POA: Insufficient documentation

## 2011-11-13 HISTORY — DX: Sleep apnea, unspecified: G47.30

## 2011-11-13 HISTORY — PX: COLONOSCOPY: SHX5424

## 2011-11-13 SURGERY — COLONOSCOPY
Anesthesia: Moderate Sedation

## 2011-11-13 MED ORDER — DIPHENHYDRAMINE HCL 50 MG/ML IJ SOLN
INTRAMUSCULAR | Status: AC
  Filled 2011-11-13: qty 1

## 2011-11-13 MED ORDER — MIDAZOLAM HCL 10 MG/2ML IJ SOLN
INTRAMUSCULAR | Status: AC
  Filled 2011-11-13: qty 2

## 2011-11-13 MED ORDER — FENTANYL CITRATE 0.05 MG/ML IJ SOLN
INTRAMUSCULAR | Status: AC
  Filled 2011-11-13: qty 2

## 2011-11-13 NOTE — H&P (View-Only) (Signed)
Subjective:     Patient ID: [name], male   DOB: 10/22/1945, 65 y.o.   MRN: [id number]  HPI [name] is a 65 y.o. Man with history of DM poorly controlled, HTN, HLD, Myasthenia gravis and systolic heart failure who is here for follow up from his last visit.    He also has continued to follow with Dr. James Howard for his myasthenia gravis.  He has been on Prednisone and mestinon for over a year at this point.  He states that he will soon be starting Cellcept to try to be able to taper off him of the long term prednisone that he has been on for sometime.  He states that he will be starting the Cellcept soon.    At last office visit he complained of decreased appetite over the last 3 months.  He states that nothing seems appetizing and that when he does eat something he feels full very quickly afterwards.  He states that he knows that he has to take his medications with food or he has stomach pain.  He denies nausea, weight loss, fevers, chills, night sweats, blood in the stool, or vomiting.  He states that over the last 3 months he has had a "band of pain" across his lower abdomen.  It is an occasional sharp pain that he attributes to taking his medications without food.  He also states that over the last month he has had a worsening of the pain in the RLQ.  This pain is a sharp pain that seems to get better when he has a bowel movement.  He has had 3 watery bowel movements daily for that month.  He has been "swigging" imodium 3-4 times daily for that months time.    Today there is no change in abdominal pain today, and still accompanied by diarrhea, without blood. No fevers or chills.   Review of Systems  All other systems reviewed and are negative.       Objective:   Physical Exam  Constitutional: He appears well-developed.  HENT:  Head: Normocephalic.  Eyes: Pupils are equal, round, and reactive to light.  Neck: Normal range of motion.  Cardiovascular: Normal rate and regular  rhythm.   Pulmonary/Chest: Effort normal and breath sounds normal.  Abdominal:       Abdomen is mildly distended and tender to palpation RLQ to LLQ, bowel sounds normal, no masses palpated  Musculoskeletal: He exhibits edema.       Patient sitting in wheelchair     

## 2011-11-13 NOTE — Op Note (Signed)
[facility] [address] [place], [place]  [address]  FLEXIBLE SIGMOIDOSCOPY PROCEDURE REPORT  PATIENT:  [name], [name]  MR#:  [id number] BIRTHDATE:  1946/03/18, 65 yrs. old  GENDER:  male ENDOSCOPIST:  Jeani Hawking, MD PROCEDURE DATE:  11/13/2011 PROCEDURE:  Flexible Sigmoidoscopy with biopsy ASA CLASS:  Class III INDICATIONS:  Abnromal CT scan MEDICATIONS:  None  DESCRIPTION OF PROCEDURE:   After the risks benefits and alternatives of the procedure were thoroughly explained, informed consent was obtained.  Digital rectal exam was performed and revealed no abnormalities.   The Pentax Ped Colon [id number] endoscope was introduced through the anus and advanced to the sigmoid colon, without limitations.  The quality of the prep was good.  The instrument was then slowly withdrawn as the mucosa was fully examined. <<PROCEDUREIMAGES>>  FINDINGS:  Extensive pseudomembrans were noted and biopsies were obtained to confirm the diagnosis.   Retroflexed views in the rectum revealed small hemorrhoids.    The scope was then withdrawn from the patient and the procedure terminated.  COMPLICATIONS:  None  ENDOSCOPIC IMPRESSION: 1) Pseudomembrane consisten with C. diff. 2) Small hemorrhoids RECOMMENDATIONS: 1) Xifaxan 200 mg BID x 14 days.  I conferred the medication and dosing with Dr. Clarisa Kindred, Neurology at [facility], ID, and Pharmacy.  Metronidazole and Vancomycin are not optimal choices. 2) Follow up in one month. 3) I will contact Dr. Graciela Husbands to inform him of the preprocedure afib.  ______________________________ Jeani Hawking, MD  n. Rosalie DoctorJeani Hawking at 11/13/2011 02:31 PM  [place], Lollie Sails, [id number]

## 2011-11-13 NOTE — Progress Notes (Signed)
Pt for colonoscopy with Dr. Elnoria Howard. Pt refuses to have CGB. MD notified. Macy Mis RN

## 2011-11-13 NOTE — Discharge Instructions (Addendum)
Flexible Sigmoidoscopy Your caregiver has ordered a flexible sigmoidoscopy. This is an exam to evaluate your lower colon. In this exam your colon is cleansed and a short fiber optic tube is inserted through your rectum and into your colon. The fiber optic scope (endoscope) is a short bundle of enclosed flexible small glass fibers. It transmits light to the area examined and images from that area to your caregiver. You do not have to worry about glass breakage in the endoscope. Discomfort is usually minimal. Sedatives and pain medications are generally not required. This exam helps to detect tumors (lumps), polyps, inflammation (swelling and soreness), and areas of bleeding. It may also be used to take biopsies. These are small pieces of tissue taken to examine under a microscope. LET YOUR CAREGIVER KNOW ABOUT:  Allergies.   Medications taken including herbs, eye drops, over the counter medications, and creams.   Use of steroids (by mouth or creams).   Previous problems with anesthetics or novocaine   Possibility of pregnancy, if this applies.   History of blood clots (thrombophlebitis).   History of bleeding or blood problems.   Previous surgery.   Other health problems.  BEFORE THE PROCEDURE Eat normally the night before the exam. Your caregiver may order a mild enema or laxative the night before. No eating or drinking should occur after midnight until the procedure is completed. A rectal suppository or enemas may be given in the morning prior to your procedure. You will be brought to the examination area in a hospital gown. You should be present 60 minutes prior to your procedure or as directed.  AFTER THE PROCEDURE   There is sometimes a little blood passed with the first bowel movement. Do not be concerned. Because air is often used during the exam, it is not unusual to pass gas and experience abdominal (belly) cramping. Walking or a warm pack on your abdomen may help with this. Do not  sleep with a heating pad as burns can occur.   You may resume all normal eating and activities.   Only take over-the-counter or prescription medicines for pain, discomfort, or fever as directed by your caregiver. Do not use aspirin or blood thinners if a biopsy (tissue sample) was taken. Consult your caregiver for medication usage if biopsies were taken.   Call for your results as instructed by your caregiver. Remember, it is your responsibility to obtain the results of your biopsy. Do not assume everything is fine because you do not hear from your caregiver.  SEEK IMMEDIATE MEDICAL CARE IF:  An oral temperature above 102 F (38.9 C) develops.   You pass large blood clots or fill a toilet with blood following the procedure. This may also occur 10 to 14 days following the procedure. It is more likely if a biopsy was taken.   You develop abdominal pain not relieved with medication or that is getting worse rather than better.    Clostridium Difficile Infection Clostridium difficile (C. diff) is a germ found in the intestines. C. diff infection can occur after taking antibiotic medicines. C. diff infection can cause watery poop (diarrhea) or severe disease. HOME CARE   Drink enough fluids to keep your pee (urine) clear or pale yellow. Avoid milk, caffeine, and alcohol.   Ask your doctor how to replace body fluid losses (rehydrate).   Eat small meals more often rather than large meals.   Take your medicine (antibiotics) as told. Finish it even if you start to feel better.  Do not  use medicines to slow the watery poop.   Wash your hands well after using the bathroom and before preparing food.   Make sure people who live with you wash their hands often.  GET HELP RIGHT AWAY IF:   The watery poop does not stop, or it comes back after you finish your medicine.   You feel very dry or thirsty (dehydrated).   You have a fever.   You have more belly (abdominal) pain or tenderness.    There is blood in your poop (stool), or your poop is black and tar-like.   You cannot eat food or drink liquids without throwing up (vomiting).  MAKE SURE YOU:   Understand these instructions.   Will watch your condition.   Will get help right away if you are not doing well or get worse.   **Dr Jeani Hawking [phone]

## 2011-11-13 NOTE — Interval H&P Note (Signed)
History and Physical Interval Note:  11/13/2011 1:09 PM  [name]  has presented today for surgery, with the diagnosis of colitis  The various methods of treatment have been discussed with the patient and family. After consideration of risks, benefits and other options for treatment, the patient has consented to  Procedure(s) (LRB): COLONOSCOPY (N/A) as a surgical intervention .  The patients' history has been reviewed, patient examined, no change in status, stable for surgery.  I have reviewed the patients' chart and labs.  Questions were answered to the patient's satisfaction.     Ivon Roedel D

## 2011-11-16 ENCOUNTER — Encounter (HOSPITAL_COMMUNITY): Payer: Self-pay

## 2011-11-16 ENCOUNTER — Telehealth: Payer: Self-pay | Admitting: Internal Medicine

## 2011-11-16 NOTE — Telephone Encounter (Signed)
I left a message for the patient to call. 

## 2011-11-16 NOTE — Telephone Encounter (Signed)
I spoke with the patient. He states that he had a colonoscopy with Dr. Elnoria Howard on Friday last week and that he called Dr. Graciela Husbands due to the patient being on a-fib during his procedure. The patient is currently being followed by Dr. Rogue Jury at [facility]. I explained he should contact Dr. Haywood Pao office to see a rhythm strip was run documenting the a-fib and have them fax this to Dr. Rogue Jury. Per the patient, Dr. Elnoria Howard was supposed to call Dr. Rogue Jury as well. The patient was also stating Dr. Rogue Jury was wanting him to start metolazone every other day and have lab work done after this. He would like his lab work done here and faxed to [facility]. He has not started the metolazone yet. I explained he should find out how soon this needs to be done after starting his medication and then let us see the order. We will then draw this for him. He is agreeable.

## 2011-11-16 NOTE — Telephone Encounter (Signed)
New Problem:    Patient called in wanting to speak with you, declined to state the nature of the call only saying that it was important.  Please call back.

## 2011-11-18 ENCOUNTER — Encounter: Payer: Medicare Other | Admitting: Internal Medicine

## 2011-11-20 ENCOUNTER — Encounter (HOSPITAL_COMMUNITY): Payer: Self-pay | Admitting: Gastroenterology

## 2011-12-07 ENCOUNTER — Other Ambulatory Visit: Payer: Self-pay | Admitting: *Deleted

## 2011-12-07 ENCOUNTER — Telehealth: Payer: Self-pay | Admitting: Internal Medicine

## 2011-12-07 MED ORDER — BENAZEPRIL HCL 40 MG PO TABS: 40.0000 mg | ORAL_TABLET | Freq: Every day | ORAL | Status: DC

## 2011-12-07 NOTE — Addendum Note (Signed)
Addended by: Neomia Dear on: 12/07/2011 02:21 PM   Modules accepted: Orders

## 2011-12-07 NOTE — Telephone Encounter (Signed)
Walk in pt Form " pt Dropped off papers From [facility]" for Heather/Klein Review Placed in Dexter Doc Box  [employer] Off Today) 12/07/11/KM

## 2011-12-08 ENCOUNTER — Telehealth: Payer: Self-pay | Admitting: *Deleted

## 2011-12-08 DIAGNOSIS — R000 Tachycardia, unspecified: Secondary | ICD-10-CM

## 2011-12-08 NOTE — Telephone Encounter (Signed)
Follow-up:    Patient returned your call.  Please call back. 

## 2011-12-08 NOTE — Telephone Encounter (Signed)
I spoke with the patient. He states he has only taken his metolazone twice with the last dose being about a week ago. He states his weight is down to 190 lbs due to some GI issues. I advised that he call Dr. Neysa Hotter office and let them know this. I explained that with his weight down, they may not want him on metolazone every other day. He will come tomorrow for a bmp which we will fax to [facility]. He will contact Dr. Neysa Hotter office.

## 2011-12-08 NOTE — Telephone Encounter (Signed)
I left a message for the patient to call in regards to the "Walk-In" form that was left on 12/07/11. This is a lab order from [facility] for a bmp to be done every 2 weeks for 3 months. I need to find out if the patient has started his metolazone.

## 2011-12-08 NOTE — Telephone Encounter (Signed)
I attempted to call the patient. I left a message I will call him back later.

## 2011-12-09 ENCOUNTER — Other Ambulatory Visit: Payer: Medicare Other

## 2011-12-11 ENCOUNTER — Telehealth: Payer: Self-pay | Admitting: Internal Medicine

## 2011-12-11 ENCOUNTER — Other Ambulatory Visit (INDEPENDENT_AMBULATORY_CARE_PROVIDER_SITE_OTHER): Payer: Medicare Other

## 2011-12-11 DIAGNOSIS — E876 Hypokalemia: Secondary | ICD-10-CM

## 2011-12-11 DIAGNOSIS — R000 Tachycardia, unspecified: Secondary | ICD-10-CM

## 2011-12-11 LAB — BASIC METABOLIC PANEL
Calcium: 8.4 mg/dL (ref 8.4–10.5)
GFR: 81.91 mL/min (ref >60.00)
Potassium: 2.5 mEq/L — CL (ref 3.5–5.1)
Sodium: 136 mEq/L (ref 135–145)

## 2011-12-11 NOTE — Telephone Encounter (Signed)
New problem:  Patient calling for test results  

## 2011-12-11 NOTE — Telephone Encounter (Signed)
I spoke with Corina at Dr. Neysa Hotter office at [facility] [phone]. I explained we had an abnormal lab that we drew on this patient, but it was ordered by Dr. Herbert Deaner. I advised I was going to fax this and someone needed to review this today. Faxed to [phone]- attn Corina.

## 2011-12-11 NOTE — Telephone Encounter (Signed)
I spoke with the patient regarding his abnormal potassium. Reviewed with Dr. Graciela Husbands, see lab note.

## 2011-12-11 NOTE — Telephone Encounter (Signed)
Patient return call to nurse to say the Dr's in [place] are not working today. Please return call to patient at [phone]

## 2011-12-14 ENCOUNTER — Telehealth: Payer: Self-pay | Admitting: *Deleted

## 2011-12-14 ENCOUNTER — Other Ambulatory Visit: Payer: Self-pay | Admitting: *Deleted

## 2011-12-14 ENCOUNTER — Encounter (HOSPITAL_COMMUNITY): Payer: Self-pay | Admitting: Emergency Medicine

## 2011-12-14 ENCOUNTER — Emergency Department (HOSPITAL_COMMUNITY): Payer: Medicare Other

## 2011-12-14 ENCOUNTER — Other Ambulatory Visit (INDEPENDENT_AMBULATORY_CARE_PROVIDER_SITE_OTHER): Payer: Medicare Other

## 2011-12-14 ENCOUNTER — Inpatient Hospital Stay (HOSPITAL_COMMUNITY)
Admission: EM | Admit: 2011-12-14 | Discharge: 2011-12-14 | DRG: 293 | Discharge status: Against Medical Advice | Payer: Medicare Other | Source: Ambulatory Visit | Attending: Emergency Medicine | Admitting: Emergency Medicine

## 2011-12-14 DIAGNOSIS — Z79899 Other long term (current) drug therapy: Secondary | ICD-10-CM

## 2011-12-14 DIAGNOSIS — I5031 Acute diastolic (congestive) heart failure: Principal | ICD-10-CM | POA: Diagnosis present

## 2011-12-14 DIAGNOSIS — R000 Tachycardia, unspecified: Secondary | ICD-10-CM

## 2011-12-14 DIAGNOSIS — IMO0002 Reserved for concepts with insufficient information to code with codable children: Secondary | ICD-10-CM

## 2011-12-14 DIAGNOSIS — I10 Essential (primary) hypertension: Secondary | ICD-10-CM | POA: Diagnosis present

## 2011-12-14 DIAGNOSIS — E876 Hypokalemia: Secondary | ICD-10-CM

## 2011-12-14 DIAGNOSIS — E785 Hyperlipidemia, unspecified: Secondary | ICD-10-CM | POA: Diagnosis present

## 2011-12-14 DIAGNOSIS — F3289 Other specified depressive episodes: Secondary | ICD-10-CM | POA: Diagnosis present

## 2011-12-14 DIAGNOSIS — I2510 Atherosclerotic heart disease of native coronary artery without angina pectoris: Secondary | ICD-10-CM | POA: Diagnosis present

## 2011-12-14 DIAGNOSIS — G4730 Sleep apnea, unspecified: Secondary | ICD-10-CM | POA: Diagnosis present

## 2011-12-14 DIAGNOSIS — G4700 Insomnia, unspecified: Secondary | ICD-10-CM

## 2011-12-14 DIAGNOSIS — Z888 Allergy status to other drugs, medicaments and biological substances status: Secondary | ICD-10-CM

## 2011-12-14 DIAGNOSIS — R600 Localized edema: Secondary | ICD-10-CM

## 2011-12-14 DIAGNOSIS — I498 Other specified cardiac arrhythmias: Secondary | ICD-10-CM

## 2011-12-14 DIAGNOSIS — Z87891 Personal history of nicotine dependence: Secondary | ICD-10-CM

## 2011-12-14 DIAGNOSIS — G4733 Obstructive sleep apnea (adult) (pediatric): Secondary | ICD-10-CM

## 2011-12-14 DIAGNOSIS — F988 Other specified behavioral and emotional disorders with onset usually occurring in childhood and adolescence: Secondary | ICD-10-CM | POA: Diagnosis present

## 2011-12-14 DIAGNOSIS — Z880 Allergy status to penicillin: Secondary | ICD-10-CM

## 2011-12-14 DIAGNOSIS — E119 Type 2 diabetes mellitus without complications: Secondary | ICD-10-CM | POA: Diagnosis present

## 2011-12-14 DIAGNOSIS — Z91041 Radiographic dye allergy status: Secondary | ICD-10-CM

## 2011-12-14 DIAGNOSIS — M47812 Spondylosis without myelopathy or radiculopathy, cervical region: Secondary | ICD-10-CM | POA: Diagnosis present

## 2011-12-14 DIAGNOSIS — F329 Major depressive disorder, single episode, unspecified: Secondary | ICD-10-CM | POA: Diagnosis present

## 2011-12-14 DIAGNOSIS — I509 Heart failure, unspecified: Secondary | ICD-10-CM | POA: Diagnosis present

## 2011-12-14 DIAGNOSIS — R609 Edema, unspecified: Secondary | ICD-10-CM

## 2011-12-14 DIAGNOSIS — G7000 Myasthenia gravis without (acute) exacerbation: Secondary | ICD-10-CM

## 2011-12-14 DIAGNOSIS — R6882 Decreased libido: Secondary | ICD-10-CM | POA: Diagnosis present

## 2011-12-14 HISTORY — DX: Localized edema: R60.0

## 2011-12-14 HISTORY — DX: Edema, unspecified: R60.9

## 2011-12-14 HISTORY — DX: Solitary pulmonary nodule: R91.1

## 2011-12-14 LAB — CBC
HCT: 40.3 % (ref 39.0–52.0)
MCV: 80.8 fL (ref 78.0–100.0)
Platelets: 239 10*3/uL (ref 150–400)
RBC: 4.99 MIL/uL (ref 4.22–5.81)
RDW: 13.8 % (ref 11.5–15.5)
WBC: 10.4 10*3/uL (ref 4.0–10.5)

## 2011-12-14 LAB — PRO B NATRIURETIC PEPTIDE: Pro B Natriuretic peptide (BNP): 5046 pg/mL — ABNORMAL HIGH (ref 0–125)

## 2011-12-14 LAB — PROTIME-INR
INR: 0.89 (ref 0.00–1.49)
Prothrombin Time: 12.2 seconds (ref 11.6–15.2)

## 2011-12-14 LAB — BASIC METABOLIC PANEL
BUN: 16 mg/dL (ref 6–23)
CO2: 30 mEq/L (ref 19–32)
Chloride: 98 mEq/L (ref 96–112)
Chloride: 97 mEq/L (ref 96–112)
Creatinine, Ser: 1 mg/dL (ref 0.4–1.5)
Creatinine, Ser: 0.96 mg/dL (ref 0.50–1.35)
GFR calc Af Amer: >90 mL/min (ref >90)
GFR: 80.94 mL/min (ref >60.00)
Potassium: 3.3 mEq/L — ABNORMAL LOW (ref 3.5–5.1)
Potassium: 3.4 mEq/L — ABNORMAL LOW (ref 3.5–5.1)

## 2011-12-14 LAB — POCT I-STAT TROPONIN I: Troponin i, poc: 0.09 ng/mL (ref 0.00–0.08)

## 2011-12-14 LAB — MAGNESIUM: Magnesium: 1.6 mg/dL (ref 1.5–2.5)

## 2011-12-14 MED ORDER — INSULIN ASPART 100 UNIT/ML ~~LOC~~ SOLN
10.0000 [IU] | Freq: Once | SUBCUTANEOUS | Status: DC
  Filled 2011-12-14: qty 10

## 2011-12-14 MED ORDER — FUROSEMIDE 10 MG/ML IJ SOLN
120.0000 mg | Freq: Once | INTRAVENOUS | Status: AC
  Administered 2011-12-14: 120 mg via INTRAVENOUS
  Filled 2011-12-14 (×2): qty 12

## 2011-12-14 MED ORDER — ALBUTEROL SULFATE (5 MG/ML) 0.5% IN NEBU: 2.5000 mg | INHALATION_SOLUTION | Freq: Four times a day (QID) | RESPIRATORY_TRACT | Status: DC | PRN

## 2011-12-14 NOTE — ED Notes (Signed)
Pt requesting something to drink; asked RN and was told to give him a diet coke for pt is a diabetic; pt refused diet drink stating "if it doesn't have sugar in it, then I don't want it"; family member at bedside; RN notified

## 2011-12-14 NOTE — ED Notes (Addendum)
Pt sts went to PCP on Friday and K+ was low and so they took him off lasix; pt now SOB with swelling in LE and chest pressure; pt sts started on new myasthenia gravis meds and now very dizzy with difficulty walking x 2 days since starting meds; pt tachypnic at present with only short phrases spoken

## 2011-12-14 NOTE — H&P (Signed)
History and Physical  Patient ID: [name] MRN: [id number], DOB: 02-14-1946 Date of Encounter: 12/14/2011, 3:46 PM Primary Physician: Danny Sias, MD Primary Cardiologist: Dr. Graciela Ward, Dr. Herbert Ward [facility])  Chief Complaint: weight gain, SOB  HPI: 66 y/o M with hx of myasthenia gravis, DM, sinus tachycardia presented to [facility] with complaints of SOB and edema. He has been simultaneously followed by [facility] and per scanned office notes, underwent sinus node modification/atrial tachycardia ablation in 08/2011. He has been unable to tolerate AV nodal blocking agents due to his MG. Office note from [facility] last month alludes to LV dysfunction although formal echo read is not available (pt states 45%); he was started on metolazone at that OV. EF was 42% by nuc in 05/2011 which was down from normal in 2011. Cath 08/2011 showed nonobstructive CAD.   [name] presented to the office this past Friday (7/5) and was found to be hypokalemic on routine lab work. He was previously on Lasix and for a short while also metolazone for peripheral edema. He was taken off of his Lasix Friday-Sunday and given a potassium supplement with instructions to follow up this morning for repeat lab work. Over the weekend he gained ~8 lbs., and felt "winded," with dyspnea on exertion and orthopnea. He further reports chest pain of "2 elephants" when lying down, but states this is associated with his myasthenia gravis. He denies fever, chills, and diaphoresis. On presentation to the [facility] office this morning for repeat lab work he reported SOB and weight gain with edema and was recommended to present to the emergency department which he did. In the ER, pBNP is 5k, borderline POC troponin 0.09. His rhythm appears to be irregular with intermittent varying P wave morphology. On telemetry he also had a brief 5beats NSVT and 16 beats of a regular tachycardia that began with a PVC and abruptly terminated, with similar QRS  morphology to his typical QRS. The patient denies any current palpitations but does report on Saturday he had an episode of heart pounding without any associated dizziness or syncope.  He also reports recent change in his myasthenia symptoms. He was seen by his neurologist on Thurs/Fri of last week complaining of profound leg weakness and blurry vision and was changed to long-acting mestinon. He had a similar reaction this morning prior to coming for labwork.  Past Medical History  Diagnosis Date  . Sinus tachycardia 2006    a. Nonobstructive CAD by cath 2013. b. s/p atrial tachycardia ablation 08/2011 (sinus node modification)  . Orthostatic hypotension   . Unspecified essential hypertension   . Hyperlipidemia   . 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 Cervical spondylosis with radiculopathy   . Syrinx     T5-T6  . Cord compression     Cord compression syndrome C5-C6, C6-C7  . ADD (attention deficit disorder)   . Decreased libido   . Depression   . DM (diabetes mellitus)   . Sleep apnea     occassionally uses cpap  . Right bundle branch block and left posterior fascicular block   . Pulmonary nodule, left     Evaluated 04/2011 by pulmonology - felt to be benign granuloma  . Peripheral edema   . 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 LV dysfunction     By echo 2013 at [facility] - reported EF 45% per pt  Most Recent Cardiac Studies: 07/2009 2D Echo Study Conclusions - NOTE: more recent echo done apparently 08/2011 but unable to locate in scanned records (done at [facility], suggestion of LV dysfunction on office notes) - Left ventricle: The cavity size was normal. Systolic function was normal. The estimated ejection fraction was in the range of 55% to 60%. Wall motion was normal; there were no regional wall motion abnormalities. Features are consistent with a pseudonormal left ventricular filling pattern, with  concomitant abnormal relaxation and increased filling pressure (grade 2 diastolic dysfunction). Doppler parameters are consistent with high ventricular filling pressure. - Ventricular septum: Thickness was moderately increased. - Mitral valve: Calcified annulus. - Left atrium: The atrium was moderately dilated.  Cardiac cath 08/2011 at [facility] - nonobstructive CAD. See Chart Review->Media LM - no angiographic evidence of significant dz LAD - mild dz in LAD with prox 20% prior to take off of D1 as well as distal 20% stenosis LCx - no angiographic evidence of significant dz in Cx  Nuc 05/2011 - Impression  Exercise Capacity: Lexiscan with no exercise.  BP Response: n/a  Clinical Symptoms: n/a  ECG Impression: Baseline RBBB. No significant ST changes with Lexiscan.  Comparison with Prior Nuclear Study: No images to compare  Overall Impression: Abnormal stress nuclear study. No evidence of ischemia or scar, however LV function appears mildly depressed but visually appears better than 42%. Consider 2-D echo to correlate.    Surgical History:  Past Surgical History  Procedure Date  . Thymectomy april 2012  . Colonoscopy 11/13/2011    Procedure: COLONOSCOPY;  Surgeon: Danny Belfast, MD;  Location: [facility] ENDOSCOPY;  Service: Endoscopy;  Laterality: N/A;     Home Meds: Prior to Admission medications   Medication Sig Start Date End Date Taking? Authorizing Provider  B Complex-C (B-COMPLEX WITH VITAMIN C) tablet Take 1 tablet by mouth daily.    Yes Historical Provider, MD  benazepril (LOTENSIN) 40 MG tablet Take 1 tablet (40 mg total) by mouth daily. For blood pressure 12/07/11  Yes Danny Sias, MD  furosemide (LASIX) 40 MG tablet Take 120 mg by mouth 2 (two) times daily.   Yes Historical Provider, MD  potassium chloride SA (K-DUR,KLOR-CON) 20 MEQ tablet Take 1 tablet (20 mEq total) by mouth 2 (two) times daily. 07/10/11  Yes Danny Salvia, MD  predniSONE (DELTASONE) 5 MG tablet Take 15 mg by  mouth daily.   Yes Historical Provider, MD  pyridostigmine (MESTINON) 60 MG tablet Take 30 mg by mouth 5 (five) times daily as needed. For MG   Yes Historical Provider, MD  zolpidem (AMBIEN) 10 MG tablet Take 1 tablet (10 mg total) by mouth at bedtime. 10/30/11  Yes Danny Sias, MD    Allergies:  Allergies  Allergen Reactions  . Aminoglycosides     MG patient  . Beta Adrenergic Blockers Other (See Comments)    MG patient  . Calcium Channel Blockers Other (See Comments)    MG patient  . Iodinated Diagnostic Agents Other (See Comments)    MG patient  . Metoprolol Other (See Comments)    MG patient  . Neuromuscular Blocking Agents Other (See Comments)    MG patient  . Other Other (See Comments)    Myasthenia Gravis patient  . Penicillins Other (See Comments)    MG  . Quinine Derivatives Other (See Comments)    MG patient    History   Social History  . Marital Status: Single    Spouse Name: N/A  Number of Children: N/A  . Years of Education: N/A   Occupational History  . Retired     Surveyor, minerals   Social History Main Topics  . Smoking status: Former Smoker -- 2.5 packs/day for 30 years    Quit date: 06/09/1991  . Smokeless tobacco: Never Used  . Alcohol Use: No  . Drug Use: No  . Sexually Active: Not on file   Other Topics Concern  . Not on file   Social History Narrative   Has a single man's lifestyle and will remind anyone of it when asked.Prefers to see only male physicians     Family History  Problem Relation Age of Onset  . Breast cancer Mother   . Other Father     Beck's Sarcoid    Review of Systems: General: negative for chills, fever, night sweats. +8 lb weight gain over the past 3 days.  Cardiovascular: + for chest pain, + edema, + orthopnea, + shortness of breath, + dyspnea on exertion. Negative for palpitations, paroxysmal nocturnal dyspnea.  Dermatological: negative for rash Respiratory: negative for cough or wheezing Urologic:  negative for hematuria Abdominal: negative for nausea, vomiting, diarrhea, bright red blood per rectum, melena, or hematemesis Neurologic: negative for visual changes, syncope. + Dizziness this morning following MG medication.  All other systems reviewed and are otherwise negative except as noted above.  Labs:   Lab Results  Component Value Date   WBC 10.4 12/14/2011   HGB 13.9 12/14/2011   HCT 40.3 12/14/2011   MCV 80.8 12/14/2011   PLT 239 12/14/2011     Lab 12/14/11 1308  NA 137  K 3.4*  CL 97  CO2 30  BUN 15  CREATININE 0.96  CALCIUM 9.2  PROT --  BILITOT --  ALKPHOS --  ALT --  AST --  GLUCOSE 363*   Lab Results  Component Value Date   CHOL 171 11/04/2011   HDL 35* 11/04/2011   LDLCALC 99 11/04/2011   TRIG 187* 11/04/2011    Radiology/Studies:  1. Chest 2 View 12/14/2011  *RADIOLOGY REPORT*  Clinical Data: Chest heaviness, shortness of breath, chest pain, dizziness, sleep apnea, diabetes, hypertension  CHEST - 2 VIEW  Comparison: 10/22/2010  Findings: Post median sternotomy. Heart size is obscured by bibasilar effusions and atelectasis. Upper lungs clear. Minimal atherosclerotic calcification of a tortuous thoracic aorta. No pneumothorax. Bones unremarkable.  IMPRESSION: Bibasilar effusions and atelectasis. Unable to exclude underlying infiltrate or other abnormalities at the lung bases, recommend follow-up exams until resolution.  Original Report Authenticated By: Lollie Marrow, M.D.    EKG: irregular rhythm, ?atrial fib 91bpm, RBBB with age-undetermined septal/lateral infarct  Physical Exam: Blood pressure 153/98, pulse 87, temperature 97.7 F (36.5 C), temperature source Oral, resp. rate 18, SpO2 99.00%. General: Well developed, well nourished, in no acute distress. Head: Normocephalic, atraumatic, sclera non-icteric, no xanthomas, nares are without discharge, nasal cannula with O2 supplementation in place.  Neck: Negative for carotid bruits. JVD not elevated. Lungs: Clear  bilaterally to auscultation without wheezes, rales, or rhonchi. Breathing is unlabored. +Diminished breath sounds at lung bases.  Heart: RRR with occasional irregularity with S1 S2. No murmurs, rubs, or gallops appreciated. Abdomen: Soft, non-tender, non-distended with normoactive bowel sounds. No hepatomegaly. No rebound/guarding. No obvious abdominal masses. Extremities: No clubbing or cyanosis. 2+ pitting edema bilaterally.  Distal pedal pulses are 1+ and equal bilaterally. Neuro: Alert and oriented X 3. Moves all extremities spontaneously. Psych:  Responds to questions appropriately with a normal affect.  Msk:  Tone appears normal, but significantly decreased MSK strength in LE bilaterally symmetrically which pt states is typical for him.   ASSESSMENT AND PLAN:  66 y/o white M with history of, myasthenia gravis, DM, and tachycardia who presents with acute exacerbation of CHF 2/2 instructed discontinuation of Lasix the past 3 days due to hypokalemia.  1. Acute CHF, likely diastolic - possibly exacerbated by lasix cessation. the ER has written for him to receive 120mg  IV Lasix. We will start him on 40mg  IV BID tomorrow and reassess clinically. Add aldactone for potassium sparing. Continue to replete & monitor K. Watch BP. Chest pressure likely related to volume overload, unclear significance of elevated POC troponin. Will obtain additional set. 2. H/o sinus tachycardia s/p atrial tachycardia ablation 08/2011 - at this time his predominant rhythm is felt to be sinus with occasional ectopic atrial beats. Will have Dr. Graciela Ward review EKG/telemetry. Dr. Eden Emms will be asking Dr. Graciela Ward if antiarrhythmic therapy would be helpful. Avoid AV blocking agents at this time due to MG. 3. Myasthenia gravis - the patient reports increased symptoms this morning after taking his long-acting dose, may need neuro input for what he refers to as a "reaction" - will ask them to see as inpatient. 4. DM - patient reports spotty  compliance, he often runs his sugars 300-400. This has been well-documented in the past. Education will be important. The ER has already ordered insulin. Add SSI. Will ask pharmacy to formally clarify home meds as no oral hypoglycemics are noted.  Signed, Ronie Spies PA-C 12/14/2011, 3:46 PM  Complicated patient of Dr [name].  Volume overload in setting of having lasix held.  Most recent EF at [facility] 45% ?  Add aldactone to spare K.  Consult Dr Pearlean Brownie who knows him regarding myasthenia.  Apparently [facility] would like to start Celcept.  Still have lots of atrial arrhythmia post ablation Intolerant to calcium blockers and beta blockers.  ? Would rhythmol be reasonable alternative.  Message left for Dr [name].  Check random cortisol to make sure stress dose steroids not needed  Charlton Haws 4:24 PM 12/14/2011

## 2011-12-14 NOTE — Telephone Encounter (Signed)
The patient came in for his lab draw today. He is very SOB with edema to his lower extremities. He has decided to go to the ER for evaluation and treatment. I have called Trish (cardmaster) and the [facility] ER charge nurse.

## 2011-12-14 NOTE — Telephone Encounter (Signed)
The patient called this morning stating he is SOB and retaining fluid. His weight today is 207.8 lbs. His baseline weight is around 200 lbs. He was around 200 lbs Thursday and Friday last week. I have advised him I cannot tell him what fluid meds to take until his potassium level is repeated today. I have advised that he come in now. I have ordered his labs as STAT. He verbalizes understanding. I will call the patient back when his lab results are received.

## 2011-12-14 NOTE — ED Notes (Signed)
Pt refuses to lay in the bed. Pt sitting in wc with monitor intact.

## 2011-12-14 NOTE — ED Notes (Signed)
Pt refuses insulin states wants his blood sugar to be above 350 at all times. Attempt to educate pt related to BS normals pt refuses and states "I know my body."

## 2011-12-14 NOTE — ED Notes (Signed)
Dr Clide Cliff informed of pt signing ama and stted ok for pt to refuse admission. Pt alert x4  Respirations easy non labored. wc to lobby ride called for pt.

## 2011-12-14 NOTE — ED Notes (Signed)
Pt refusing admission at this time.  

## 2011-12-14 NOTE — ED Notes (Signed)
Pt leaving AMA; pt d/c'd from IV, monitor, continuous pulse oximetry and blood pressure cuff; family at bedside

## 2011-12-14 NOTE — ED Notes (Signed)
Pt has returned from bathroom

## 2011-12-14 NOTE — ED Provider Notes (Signed)
History     CSN: [id number]  Arrival date & time 12/14/11  1149   First MD Initiated Contact with Patient 12/14/11 1219      Chief Complaint  Patient presents with  . Shortness of Breath  . Chest Pain  . Dizziness    (Consider location/radiation/quality/duration/timing/severity/associated sxs/prior treatment) HPI  66 year old male with history of myasthenia gravis, systolic heart failure, and hypertension presents complaining shortness of breath. Patient was found to have low potassium level during a office visit with Dr. several days ago. He was recommended to stay off his Lasix. He has not been taking his Lasix for 4 days. For the past 3 days he noticed increased shortness of breath, increased fluid gain, and increased fatigue. Symptoms worsen with ambulation. Symptom is not associated with fever, productive cough, hemoptysis, chest pain, nausea, vomiting, diarrhea, or abdominal pain. Patient is a former smoker. He reports that he had a cardiac catheterization done a few months ago which were normal.  Also reports cardiac ablation by an EP doctor in [place].  Pt is aware that he has low potassium and admits to not taking his medication as recommended.  Pt also reports having side effects to one of his MG medications (increase dosage) for the past several days, with flushing, dizziness, and difficulty walking in which he will notify his neurologist, and has discontinue his medication.  Not on home O2.  Past Medical History  Diagnosis Date  . Sinus tachycardia 2006    Cardiac Cath unremarkable  . Orthostatic hypotension   . Unspecified essential hypertension   . Hyperlipidemia   . 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 Cervical spondylosis with radiculopathy   . Syrinx     T5-T6  . Cord compression     Cord compression syndrome C5-C6, C6-C7  . ADD (attention deficit disorder)   . Decreased libido   . Depression   . DM (diabetes mellitus)   . Sleep apnea       occassionally uses cpap  . Right bundle branch block and left posterior fascicular block     Past Surgical History  Procedure Date  . Thymectomy april 2012  . Colonoscopy 11/13/2011    Procedure: COLONOSCOPY;  Surgeon: Theda Belfast, MD;  Location: [facility] ENDOSCOPY;  Service: Endoscopy;  Laterality: N/A;    Family History  Problem Relation Age of Onset  . Breast cancer Mother   . Other Father     Beck's Sarcoid    History  Substance Use Topics  . Smoking status: Former Smoker -- 2.5 packs/day for 30 years    Quit date: 06/09/1991  . Smokeless tobacco: Never Used  . Alcohol Use: No      Review of Systems  All other systems reviewed and are negative.    Allergies  Aminoglycosides; Beta adrenergic blockers; Calcium channel blockers; Iodinated diagnostic agents; Metoprolol; Neuromuscular blocking agents; Other; Penicillins; and Quinine derivatives  Home Medications   Current Outpatient Rx  Name Route Sig Dispense Refill  . B COMPLEX-C PO TABS Oral Take 1 tablet by mouth daily.     Marland Kitchen BENAZEPRIL HCL 40 MG PO TABS Oral Take 1 tablet (40 mg total) by mouth daily. For blood pressure 30 tablet 6  . FUROSEMIDE 40 MG PO TABS Oral Take 3 tablets (120 mg total) by mouth daily. Take 80 mg in the morning and 40 mg in the afternoon. 90 tablet 1  . POTASSIUM CHLORIDE CRYS ER 20 MEQ PO  TBCR Oral Take 1 tablet (20 mEq total) by mouth 2 (two) times daily.    Marland Kitchen PREDNISONE 5 MG PO TABS Oral Take 15 mg by mouth daily.    Marland Kitchen PYRIDOSTIGMINE BROMIDE 60 MG PO TABS  30 mg as directed. Up to 5 times daily as needed.    Marland Kitchen ZOLPIDEM TARTRATE 10 MG PO TABS Oral Take 1 tablet (10 mg total) by mouth at bedtime. 30 tablet 5    BP 153/119  Pulse 75  Temp 97.7 F (36.5 C) (Oral)  Resp 18  SpO2 93%  Physical Exam  Nursing note and vitals reviewed. Constitutional: He is oriented to person, place, and time. He appears well-developed and well-nourished. No distress.  HENT:  Head: Normocephalic and  atraumatic.  Mouth/Throat: Oropharynx is clear and moist.  Eyes: Conjunctivae are normal.  Neck: Neck supple. No JVD present.  Cardiovascular: Exam reveals no gallop and no friction rub.   No murmur heard.      Irregular rate  Pulmonary/Chest: Effort normal. No respiratory distress. He has no wheezes. He has no rales. He exhibits no tenderness.  Abdominal: Soft.  Musculoskeletal: He exhibits edema.       2+ pitting edema bilaterally.  No erythema, palpable cords, or calf tenderness.  Pedal pulse palpable.    Neurological: He is alert and oriented to person, place, and time.  Skin: Skin is warm.    ED Course  Procedures (including critical care time)   Labs Reviewed  CBC  BASIC METABOLIC PANEL  PRO B NATRIURETIC PEPTIDE  PROTIME-INR   No results found.   No diagnosis found.  Results for orders placed during the hospital encounter of 12/14/11  CBC      Component Value Range   WBC 10.4  4.0 - 10.5 K/uL   RBC 4.99  4.22 - 5.81 MIL/uL   Hemoglobin 13.9  13.0 - 17.0 g/dL   HCT 14.7  82.9 - 56.2 %   MCV 80.8  78.0 - 100.0 fL   MCH 27.9  26.0 - 34.0 pg   MCHC 34.5  30.0 - 36.0 g/dL   RDW 13.0  86.5 - 78.4 %   Platelets 239  150 - 400 K/uL  BASIC METABOLIC PANEL      Component Value Range   Sodium 137  135 - 145 mEq/L   Potassium 3.4 (*) 3.5 - 5.1 mEq/L   Chloride 97  96 - 112 mEq/L   CO2 30  19 - 32 mEq/L   Glucose, Bld 363 (*) 70 - 99 mg/dL   BUN 15  6 - 23 mg/dL   Creatinine, Ser 6.96  0.50 - 1.35 mg/dL   Calcium 9.2  8.4 - 29.5 mg/dL   GFR calc non Af Amer 84 (*) >90 mL/min   GFR calc Af Amer >90  >90 mL/min  PRO B NATRIURETIC PEPTIDE      Component Value Range   Pro B Natriuretic peptide (BNP) 5046.0 (*) 0 - 125 pg/mL  PROTIME-INR      Component Value Range   Prothrombin Time 12.2  11.6 - 15.2 seconds   INR 0.89  0.00 - 1.49  POCT I-STAT TROPONIN I      Component Value Range   Troponin i, poc 0.09 (*) 0.00 - 0.08 ng/mL   Comment NOTIFIED PHYSICIAN      Comment 3            Dg Chest 2 View  12/14/2011  *RADIOLOGY REPORT*  Clinical Data: Chest  heaviness, shortness of breath, chest pain, dizziness, sleep apnea, diabetes, hypertension  CHEST - 2 VIEW  Comparison: 10/22/2010  Findings: Post median sternotomy. Heart size is obscured by bibasilar effusions and atelectasis. Upper lungs clear. Minimal atherosclerotic calcification of a tortuous thoracic aorta. No pneumothorax. Bones unremarkable.  IMPRESSION: Bibasilar effusions and atelectasis. Unable to exclude underlying infiltrate or other abnormalities at the lung bases, recommend follow-up exams until resolution.  Original Report Authenticated By: Lollie Marrow, M.D.    1. CHF exacerbation 2. Hyperglycemia 3. Elevated troponin  MDM  Patient presents with increase SOB since he recently stop taking Lasix for the past 4 days due to hypokalemia.  Pt has notice increase leg swelling and SOB.  No chest pain.  On exam, pt in mild resp distress, rales heard to lower lung base, 2+ pitting edema to lower extremities.  Work up initiated  2:51 PM K+ 3.4.  Pro BNP 5046.  Troponin 0.09.  CXR with bibasilar effusion and atelectasis.  Doubt infectious etiology.  Pt resp function improves.  Lasix 120mg  IV given.  Discussed care with my attending.  Cardiology has been consulted.  Pt's PCP, Dr. Elberta Spaniel is made aware of patient.    Pt also has elevated CBG of 363, no metabolic acidosis.  His blood sugar are not well controlled as evidence by prior CBG reading.  Insulin 10 unit given here today.  Pt will be admitted for further management of his CHF, and uncontrolled hyperglycemia.  Pt currently in NAD, VSS.       Fayrene Helper, PA-C 12/14/11 1525

## 2011-12-14 NOTE — ED Notes (Signed)
i-stat troponium result shown to Dr Jeraldine Loots.

## 2011-12-15 ENCOUNTER — Telehealth: Payer: Self-pay | Admitting: Internal Medicine

## 2011-12-15 NOTE — Telephone Encounter (Signed)
I left a message for the patient to call. 

## 2011-12-15 NOTE — ED Provider Notes (Signed)
Medical screening examination/treatment/procedure(s) were conducted as a shared visit with non-physician practitioner(s) and myself.  I personally evaluated the patient during the encounter Patient with recent cessation of lasix 2/2 hypokalemia, now w Sx/Sg c/w CHF exacerbation.  Mild elevated trop was present, with BNP increase.  (Hyperglycemia is also notable.)  Gerhard Munch, MD 12/15/11 [phone]

## 2011-12-15 NOTE — Telephone Encounter (Signed)
(  late entry)... I spoke with the patient this morning. He states he saw Dr. Eden Emms yesterday in the ER. He left AMA last night because he "needed my mestinon." He states it was late when he ate yesterday and he wasn't getting his meds. He states he was told the top chamber of his heart had an odd beat. He is concerned that he was told he had heart failure. I explained to the patient he was having an acute episode of this. I advised that him leaving AMA was not the best decision considering his volume overload. He weighed around 207.8 yesterday. He weighed this morning at 207.6. His baseline weight is around 200 lbs. I explained it is very hard to treat him when he is not completely compliant. He does not sound a audibly SOB as yesterday. I advised the patient that if he is feeling the way he did yesterday, that he should report back to the ER. He states he tried to call the ER last night to get back in there and talk to Dr. Eden Emms, because he was talking about a new med (potassium sparing drug). He is very concerned about med changes due to the risk of interaction with his MG. He has multiple questions. I explained to the patient it is best if he speaks with a physician. I have reviewed the patient's situation with Dr. Graciela Husbands. He stated the best we can do for the patient's compliance is to send him back to the ER if warranted, or have him see Lorin Picket this week. Per the patient, he said he feels better today than yesterday. I advised it is very hard to treat his fluid status without knowing his K+ status. He took lasix 120 mg this morning, he took 20 meq of potassium as well. I advised an additional 20 meq of potassium since his K+ on his labs in the office was 3.3 yesterday. The patient reports he was given IV lasix in the ER yesterday, but he does not recall a potassium supplement. I am unsure of this as I cannot find documentation of the meds he was given. He is agreeable with seeing Scott tomorrow at 10:30 am. I  have instructed him to report back to the ER if his symptoms worsen today. He voices understanding.

## 2011-12-15 NOTE — Telephone Encounter (Signed)
Pt was in ED last night & left AMA because they where not really treating him and it was a medication he needed and he forgot all that Dr. Eden Emms had told him and he mentioned a new medication he needed and he also was told he had some ekg changes

## 2011-12-16 ENCOUNTER — Ambulatory Visit (INDEPENDENT_AMBULATORY_CARE_PROVIDER_SITE_OTHER): Payer: Medicare Other | Admitting: Physician Assistant

## 2011-12-16 ENCOUNTER — Encounter: Payer: Self-pay | Admitting: Physician Assistant

## 2011-12-16 VITALS — BP 148/66 | HR 93 | Resp 18 | Ht 67.0 in | Wt 207.0 lb

## 2011-12-16 DIAGNOSIS — I5043 Acute on chronic combined systolic (congestive) and diastolic (congestive) heart failure: Secondary | ICD-10-CM

## 2011-12-16 DIAGNOSIS — I5022 Chronic systolic (congestive) heart failure: Secondary | ICD-10-CM

## 2011-12-16 DIAGNOSIS — I10 Essential (primary) hypertension: Secondary | ICD-10-CM

## 2011-12-16 DIAGNOSIS — E876 Hypokalemia: Secondary | ICD-10-CM

## 2011-12-16 DIAGNOSIS — I498 Other specified cardiac arrhythmias: Secondary | ICD-10-CM

## 2011-12-16 DIAGNOSIS — G7000 Myasthenia gravis without (acute) exacerbation: Secondary | ICD-10-CM

## 2011-12-16 DIAGNOSIS — I499 Cardiac arrhythmia, unspecified: Secondary | ICD-10-CM

## 2011-12-16 LAB — BASIC METABOLIC PANEL
CO2: 31 mEq/L (ref 19–32)
Calcium: 8.8 mg/dL (ref 8.4–10.5)
Creatinine, Ser: 1 mg/dL (ref 0.4–1.5)
GFR: 82.89 mL/min (ref >60.00)
Sodium: 136 mEq/L (ref 135–145)

## 2011-12-16 NOTE — Patient Instructions (Addendum)
Your physician recommends that you return for lab work in: TODAY BMET  Your physician recommends that you return for lab work in: 12/25/11 REPEAT BMET  PLEASE MAKE APPOINTMENT 1-2 WEEKS TO SEE SCOTT WEAVER, PAC SAME DAY DR. Graciela Husbands IS IN THE OFFICE 12/25/11 @ 11:10

## 2011-12-16 NOTE — Progress Notes (Signed)
[address] [place], [place]  [address] Phone: [phone] Fax:  [phone]  Date:  12/16/2011   Name:  [name]   DOB:  1945-09-02   MRN:  [id number]  PCP:  Leodis Sias, MD  Primary Cardiologist/Primary Electrophysiologist:  Dr. Sherryl Manges (Dr. Herbert Deaner at [facility]) Neurologist:  Dr. Dimas Aguas [facility]); Dr. Pearlean Brownie [facility])   History of Present Illness: [name] is a 66 y.o. male who has a fairly complex hx.  He has a hx of DM2, HTN, HL, RBBB and LAFB.   He has had problems with sinus tachy in the setting of Myasthenia Gravis.  He has been intol to AV nodal blocking agents in the past due to his MG.  He was ultimately referred to [facility].  He had sinus node modification/ATach ablation 08/2011.  Notes from [facility] recently indicate LV dysfunction with reported EF 45%.  LHC at [facility] 3/13: pLAD 20%, dLAD 20%, mRCA 20%, PDA 20%.  Nuc 12/12: no scar or ischemia, EF 42% (visually looks better).  He has had some issues with compliance in the past and also tends to adjust medications on his own.  He was apparently given metolazone for LE edema by his doctors at [facility] recently.  Ultimately had follow up labs here with low K+ (2.5).  This was replaced.  Also, Lasix was put on hold.  He returned for follow up labs and noted sudden weight gain with increased SOB.  We was seen by Dr. Charlton Haws in the ED 7/8.  He was volume overloaded with bilateral effusions on CXR, BNP of 5000.  Also, noted to have increased atrial arrhythmias on tele.  Plan was to admit for diuresis, discuss with Dr. Graciela Husbands regarding +/- anti-arrhythmics and have neuro follow for help with his MG medications (these had recently been changed).  However, the patient grew frustrated with his care in the ED and left AMA.  He was added on to my schedule today for follow up.  He tells me he is taking Lasix 120 mg bid and K+ 40 mEq bid since he left the ED.  His LE edema is better.  His weight is improved.  His breathing is  also better.  He sleeps at an incline on his R side chronically.  No PND.  No syncope.  No chest pain.  No recent palpitations.  However, his ECG today demonstrates AFib with CVR.    Wt Readings from Last 3 Encounters:  12/16/11 207 lb (93.895 kg)  11/13/11 214 lb (97.07 kg)  11/13/11 214 lb (97.07 kg)    Potassium  Date/Time Value Range Status  12/14/2011  1:08 PM 3.4* 3.5 - 5.1 mEq/L Final  12/14/2011 11:17 AM 3.3* 3.5 - 5.1 mEq/L Final  12/11/2011 12:54 PM 2.5* 3.5 - 5.1 mEq/L Final  11/04/2011  8:38 AM 3.7  3.5 - 5.3 mEq/L Final   Creatinine, Ser  Date/Time Value Range Status  12/14/2011  1:08 PM 0.96  0.50 - 1.35 mg/dL Final  11/14/6293 28:41 AM 1.0  0.4 - 1.5 mg/dL Final  08/08/4399 02:72 PM 1.0  0.4 - 1.5 mg/dL Final  5/36/6440  3:47 PM 1.0  0.4 - 1.5 mg/dL Final     Pro B Natriuretic peptide (BNP)  Date/Time Value Range Status  12/14/2011  1:08 PM 5046.0* 0 - 125 pg/mL Final    Past Medical History  Diagnosis Date  . Sinus tachycardia 2006    a. Nonobstructive CAD by cath 2013. b. s/p atrial  tachycardia ablation 08/2011 (sinus node modification)  . Orthostatic hypotension   . Unspecified essential hypertension   . Hyperlipidemia   . 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 Cervical spondylosis with radiculopathy   . Syrinx     T5-T6  . Cord compression     Cord compression syndrome C5-C6, C6-C7  . ADD (attention deficit disorder)   . Decreased libido   . Depression   . DM (diabetes mellitus)   . Sleep apnea     occassionally uses cpap  . Right bundle branch block and left posterior fascicular block   . Pulmonary nodule, left     Evaluated 04/2011 by pulmonology - felt to be benign granuloma  . Peripheral edema   . 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 LV dysfunction     By echo 2013 at [facility] - reported EF 45% per pt    Current Outpatient Prescriptions  Medication Sig Dispense Refill  . B  Complex-C (B-COMPLEX WITH VITAMIN C) tablet Take 1 tablet by mouth daily.       . benazepril (LOTENSIN) 40 MG tablet Take 1 tablet (40 mg total) by mouth daily. For blood pressure  30 tablet  6  . furosemide (LASIX) 40 MG tablet Take 120 mg by mouth 2 (two) times daily.      . potassium chloride SA (K-DUR,KLOR-CON) 20 MEQ tablet Take 40 mEq by mouth 2 (two) times daily.       . predniSONE (DELTASONE) 5 MG tablet Take 15 mg by mouth daily.      Marland Kitchen pyridostigmine (MESTINON) 180 MG CR tablet Take 64 mg by mouth 5 (five) times daily. Recently changed from short-acting to long-acting      . zolpidem (AMBIEN) 10 MG tablet Take 1 tablet (10 mg total) by mouth at bedtime.  30 tablet  5    Allergies: Allergies  Allergen Reactions  . Aminoglycosides     MG patient  . Beta Adrenergic Blockers Other (See Comments)    MG patient  . Calcium Channel Blockers Other (See Comments)    MG patient  . Iodinated Diagnostic Agents Other (See Comments)    MG patient  . Metoprolol Other (See Comments)    MG patient  . Neuromuscular Blocking Agents Other (See Comments)    MG patient  . Other Other (See Comments)    Myasthenia Gravis patient  . Penicillins Other (See Comments)    MG  . Quinine Derivatives Other (See Comments)    MG patient    History  Substance Use Topics  . Smoking status: Former Smoker -- 2.5 packs/day for 30 years    Quit date: 06/09/1991  . Smokeless tobacco: Never Used  . Alcohol Use: No     ROS:  Please see the history of present illness.     All other systems reviewed and negative.   PHYSICAL EXAM: VS:  BP 148/66  Pulse 93  Resp 18  Ht 5\' 7"  (1.702 m)  Wt 207 lb (93.895 kg)  BMI 32.42 kg/m2  SpO2 96% Well nourished, well developed, in no acute distress HEENT: normal Neck: + JVD at 90 degrees Cardiac:  normal S1, split S2; irregularly irregular; no murmur Lungs:  Decreased breath sounds bilaterally, no wheezing, rhonchi or rales Abd: soft, nontender, no  hepatomegaly Ext: 1-2+ bilateral LE edema Skin: warm and dry Neuro:  CNs 2-12 intact, no focal  abnormalities noted  EKG:  Sinus rhythm, heart rate 82, frequent PACs, right bundle branch block, no significant change from prior      ASSESSMENT AND PLAN:  1.  CHF Variable reports on EF.  However, most recent records indicate EF 45%. 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 Question if LV dysfunction related to atrial arrhythmias. Recent cath negative for significant CAD. Volume appears improved over last 48 hrs.   He is on a dose of Lasix 120 bid and K 40 bid. Continue this regimen. Check BMET today and repeat next week. Follow up will ultimately be with his cardiologist at [facility].  I will see him back in 1-2 weeks to assess his volume status and make medication changes, if any.    2.  Atrial Arrhythmias.  At first glance, I thought this was AFib. I reviewed with Dr. Sherryl Manges.  His ECG demonstrates NSR with PACs. P wave morphology is similar to prior tracings.  Dr. Graciela Husbands suggested trying a small dose of beta blocker.   I discussed this with the patient.  He declines initiation of metoprolol 12.5 mg bid. He prefers to have his rhythm managed at [facility].  He is to see Dr. Herbert Deaner this week or next. He states he may be having another procedure. We will send records to Dr. Herbert Deaner to keep him apprised of the recent events.  3.  Myasthenia Gravis Management per his neurologists.  4.  Hypertension Elevated today. Continue diuresis and monitor for now. Consider addition of ACE.  Likely will leave this up to his cardiologist at [facility].  5.  Hypokalemia Check BMET today.  6.  Diabetes Mellitus Management per PCP   Signed, Tereso Newcomer, PA-C  11:21 AM 12/16/2011

## 2011-12-17 ENCOUNTER — Telehealth: Payer: Self-pay | Admitting: *Deleted

## 2011-12-17 ENCOUNTER — Telehealth: Payer: Self-pay | Admitting: Physician Assistant

## 2011-12-17 NOTE — Telephone Encounter (Signed)
lmom labs ok and no changes and to f/u w/PCP for diabetes.

## 2011-12-17 NOTE — Telephone Encounter (Signed)
Message copied by Tarri Fuller on Thu Dec 17, 2011  2:56 PM ------      Message from: [place], [place] T      Created: Wed Dec 16, 2011  4:55 PM       Continue with current treatment plan.      Follow up BMET in one week as planned.      Follow up with PCP for diabetes.      Tereso Newcomer, PA-C  4:55 PM 12/16/2011

## 2011-12-17 NOTE — Telephone Encounter (Signed)
ptcb and said thank you for the call for the lab results. pt wants to make sure the OV note from yesterday and the hospital notes were sent to Dr. Herbert Deaner @ [facility]. I told him I would make sure they were sent.

## 2011-12-25 ENCOUNTER — Encounter: Payer: Self-pay | Admitting: Physician Assistant

## 2011-12-25 ENCOUNTER — Other Ambulatory Visit: Payer: Medicare Other

## 2011-12-25 ENCOUNTER — Ambulatory Visit (INDEPENDENT_AMBULATORY_CARE_PROVIDER_SITE_OTHER): Payer: Medicare Other | Admitting: Physician Assistant

## 2011-12-25 VITALS — BP 150/97 | HR 87 | Ht 67.0 in | Wt 193.0 lb

## 2011-12-25 DIAGNOSIS — I509 Heart failure, unspecified: Secondary | ICD-10-CM

## 2011-12-25 DIAGNOSIS — I498 Other specified cardiac arrhythmias: Secondary | ICD-10-CM

## 2011-12-25 DIAGNOSIS — I10 Essential (primary) hypertension: Secondary | ICD-10-CM

## 2011-12-25 DIAGNOSIS — I499 Cardiac arrhythmia, unspecified: Secondary | ICD-10-CM

## 2011-12-25 NOTE — Progress Notes (Signed)
[address] [place], [place]  [address] Phone: [phone] Fax:  [phone]  Date:  12/25/2011   Name:  [name]   DOB:  [birth year]   MRN:  [id number]  PCP:  Leodis Sias, MD  Primary Cardiologist/Primary Electrophysiologist:  Dr. Sherryl Manges (Dr. Herbert Deaner at [facility]) Neurologist:  Dr. Dimas Aguas [facility]); Dr. Pearlean Brownie [facility])   History of Present Illness: [name] is a 66 y.o. male who returns for follow up.  He has a hx of DM2, HTN, HL, RBBB and LAFB.   He has had problems with sinus tachy in the setting of Myasthenia Gravis.  He has been intol to AV nodal blocking agents in the past due to his MG.  He was ultimately referred to [facility].  He had sinus node modification/ATach ablation 08/2011.  Notes from [facility] recently indicate LV dysfunction with reported EF 45%.  LHC at [facility] 3/13: pLAD 20%, dLAD 20%, mRCA 20%, PDA 20%.  Nuc 12/12: no scar or ischemia, EF 42% (visually looks better).  He has had some issues with compliance in the past and also tends to adjust medications on his own.    He was recently seen after a trip to the ED for volume overload.  He had recently been placed on metolazone resulting in low K+ and diuretics were held.  He was to be admitted to the hospital, but the patient left AMA.  He was taking Lasix 120 mg bid and K+ 40 mEq bid.   This was continued.      Today, his weight is down 14 pounds since I saw him.  His breathing is improved.  He denies any significant chest pain.  He denies syncope.  He has seen his doctors at [facility].  It sounds as though they will schedule him for a right heart catheterization soon.  He apparently had a relook echocardiogram recently that shows improved ejection fraction.  He also had a followup basic metabolic panel 2 days ago.  He was told everything looked well except for his magnesium.  He cannot take magnesium tablets.  Wt Readings from Last 3 Encounters:  12/25/11 193 lb (87.544 kg)  12/16/11 207 lb (93.895 kg)   11/13/11 214 lb (97.07 kg)     Past Medical History  Diagnosis Date  . Sinus tachycardia 2006    a. Nonobstructive CAD by cath 2013. b. s/p atrial tachycardia ablation 08/2011 (sinus node modification)  . Orthostatic hypotension   . Unspecified essential hypertension   . Hyperlipidemia   . 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 Cervical spondylosis with radiculopathy   . Syrinx     T5-T6  . Cord compression     Cord compression syndrome C5-C6, C6-C7  . ADD (attention deficit disorder)   . Decreased libido   . Depression   . DM (diabetes mellitus)   . Sleep apnea     occassionally uses cpap  . Right bundle branch block and left posterior fascicular block   . Pulmonary nodule, left     Evaluated 04/2011 by pulmonology - felt to be benign granuloma  . Peripheral edema   . 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 LV dysfunction     By echo 2013 at [facility] - reported EF 45% per pt    Current Outpatient Prescriptions  Medication Sig Dispense Refill  . B Complex-C (B-COMPLEX WITH VITAMIN C)  tablet Take 1 tablet by mouth daily.       . benazepril (LOTENSIN) 40 MG tablet Take 1 tablet (40 mg total) by mouth daily. For blood pressure  30 tablet  6  . furosemide (LASIX) 40 MG tablet Take 120 mg by mouth 2 (two) times daily.      . potassium chloride SA (K-DUR,KLOR-CON) 20 MEQ tablet Take 40 mEq by mouth 2 (two) times daily.       . predniSONE (DELTASONE) 5 MG tablet Take 15 mg by mouth daily.      Marland Kitchen pyridostigmine (MESTINON) 60 MG tablet Take 1 tablet (60 mg total) by mouth 5 (five) times daily.      Marland Kitchen zolpidem (AMBIEN) 10 MG tablet Take 1 tablet (10 mg total) by mouth at bedtime.  30 tablet  5    Allergies: Allergies  Allergen Reactions  . Aminoglycosides     MG patient  . Beta Adrenergic Blockers Other (See Comments)    MG patient  . Calcium Channel Blockers Other (See Comments)    MG patient  .  Iodinated Diagnostic Agents Other (See Comments)    MG patient  . Metoprolol Other (See Comments)    MG patient  . Neuromuscular Blocking Agents Other (See Comments)    MG patient  . Other Other (See Comments)    Myasthenia Gravis patient  . Penicillins Other (See Comments)    MG  . Quinine Derivatives Other (See Comments)    MG patient    History  Substance Use Topics  . Smoking status: Former Smoker -- 2.5 packs/day for 30 years    Quit date: 06/09/1991  . Smokeless tobacco: Never Used  . Alcohol Use: No     ROS:  Please see the history of present illness.     All other systems reviewed and negative.   PHYSICAL EXAM: VS:  BP 150/97  Pulse 87  Ht 5\' 7"  (1.702 m)  Wt 193 lb (87.544 kg)  BMI 30.23 kg/m2 Well nourished, well developed, in no acute distress HEENT: normal Neck: no JVD at 90 degrees Cardiac:  normal S1, S2; irregularly irregular; no murmur Lungs:  Decreased breath sounds bilaterally, no wheezing, rhonchi or rales Abd: soft, nontender, no hepatomegaly Ext: trace to 1+ bilateral LE edema Skin: warm and dry Neuro:  CNs 2-12 intact, no focal abnormalities noted  EKG:  Sinus rhythm, heart rate 84, frequent PACs, right bundle branch block, no significant change from prior      ASSESSMENT AND PLAN:  1.  CHF Variable reports on EF.  However, most recent records indicate EF 45%. He now states his EF is improved by echo done at [facility] yesterday. Now, it sounds like a RHC is planned.  He had many questions about this today and I tried to answer all of them.  Volume improved. Continue current Rx. I will obtain follow up labs from [facility] done this week. He will continue follow up with his doctors at [facility]. He should schedule follow up with Dr. Sherryl Manges prn.  2.  Atrial Arrhythmias.  Per Dr. Herbert Deaner at [facility].   3.  Myasthenia Gravis Management per his neurologists.  4.  Hypertension Elevated today.  However, he has not taken his medications yet.   Signed, Tereso Newcomer, PA-C  12:12 PM 12/25/2011

## 2011-12-25 NOTE — Patient Instructions (Addendum)
Your physician recommends that you schedule a follow-up appointment as needed  INCREASE your magnesium intake in your diet

## 2011-12-28 ENCOUNTER — Encounter: Payer: Self-pay | Admitting: Physician Assistant

## 2011-12-31 ENCOUNTER — Telehealth: Payer: Self-pay | Admitting: Internal Medicine

## 2011-12-31 NOTE — Telephone Encounter (Addendum)
Forward 10 pages to Dr. Stan Head for review on 12-31-11 ym

## 2012-01-07 ENCOUNTER — Other Ambulatory Visit: Payer: Self-pay | Admitting: Internal Medicine

## 2012-01-07 ENCOUNTER — Telehealth: Payer: Self-pay | Admitting: *Deleted

## 2012-01-07 DIAGNOSIS — R109 Unspecified abdominal pain: Secondary | ICD-10-CM
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
## 2012-01-08 ENCOUNTER — Encounter: Payer: Self-pay | Admitting: Internal Medicine

## 2012-01-08 ENCOUNTER — Ambulatory Visit (INDEPENDENT_AMBULATORY_CARE_PROVIDER_SITE_OTHER): Payer: Medicare Other | Admitting: Internal Medicine

## 2012-01-08 ENCOUNTER — Telehealth: Payer: Self-pay | Admitting: *Deleted

## 2012-01-08 VITALS — BP 150/78 | HR 80 | Temp 97.3°F | Ht 67.0 in

## 2012-01-08 DIAGNOSIS — E119 Type 2 diabetes mellitus without complications: Secondary | ICD-10-CM

## 2012-01-08 DIAGNOSIS — G7000 Myasthenia gravis without (acute) exacerbation: Secondary | ICD-10-CM

## 2012-01-08 DIAGNOSIS — K2970 Gastritis, unspecified, without bleeding: Secondary | ICD-10-CM | POA: Insufficient documentation

## 2012-01-08 DIAGNOSIS — I10 Essential (primary) hypertension: Secondary | ICD-10-CM

## 2012-01-08 LAB — PROTIME-INR: INR: 0.96 (ref <1.50)

## 2012-01-08 LAB — APTT: aPTT: 26 seconds (ref 24–37)

## 2012-01-08 MED ORDER — OXYCODONE HCL 5 MG PO TABS: 5.0000 mg | ORAL_TABLET | ORAL | Status: DC | PRN

## 2012-01-08 MED ORDER — PANTOPRAZOLE SODIUM 40 MG PO TBEC: 40.0000 mg | DELAYED_RELEASE_TABLET | Freq: Every day | ORAL | Status: DC

## 2012-01-08 NOTE — Telephone Encounter (Signed)
No answer and no machine.  I will attempt to call patient again at a later time

## 2012-01-08 NOTE — Progress Notes (Signed)
Subjective:   Patient ID: [name] male   DOB: 01-13-1946 66 y.o.   MRN: [id number]  HPI: [name] is a 66 y.o. man with a PMH significant for myasthenia gravis, HTN, uncontrolled diabetes, and multifocal atrial tachycardia who presents to clinic today with several complaints.   He states that he was over at [facility] cardiology yesterday and underwent a right heart which he states was clear.  He also states that he has undergone a left heart catheterization which "was miraculously clear."  He states that he occasionally has periods of rapid heart beat but they are managing it there.    He continues to have problems with his lower abdomen.  He underwent a colonoscopy by Dr. Elnoria Howard which found C. Diff.  He did take the Rifaximin treatment but is a bit inconsistent with his reporting of when he is going to be done with the medications.  He states that he had gone back to see Dr. Elnoria Howard and got angry with him and he "fired" Dr. Elnoria Howard.  He still has pain across his abdomen that seems to get worse after he eats anything.  He also states that as soon as he eats anything substantial that he has to have a BM right away after that.   He states that his appetite is down because of this.  He has also noted some irritation on his backside that he thinks is from the "constant diarrhea."    He denies fever, chills, vomiting, blood in the stool, or chest pain. He states that he has been taking ibuprofen "like they are candy" to help with the pain.    He still has not started the Cellcept for his Myasthenia because "I don't want to double up the diarrhea."  He states that once he gets this stomach issue worked out that he is planning on starting the Cellcept and then will be able to get off the prednisone and onto something for his diabetes.  He is worried cause he is weaker every day.    Past Medical History  Diagnosis Date  . Sinus tachycardia 2006    a. Nonobstructive CAD by cath 2013. b. s/p atrial tachycardia  ablation 08/2011 (sinus node modification)  . Orthostatic hypotension   . Unspecified essential hypertension   . Hyperlipidemia   . 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 Cervical spondylosis with radiculopathy   . Syrinx     T5-T6  . Cord compression     Cord compression syndrome C5-C6, C6-C7  . ADD (attention deficit disorder)   . Decreased libido   . Depression   . DM (diabetes mellitus)   . Sleep apnea     occassionally uses cpap  . Right bundle branch block and left posterior fascicular block   . Pulmonary nodule, left     Evaluated 04/2011 by pulmonology - felt to be benign granuloma  . Peripheral edema   . 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 LV dysfunction     By echo 2013 at [facility] - reported EF 45% per pt   Current Outpatient Prescriptions  Medication Sig Dispense Refill  . B Complex-C (B-COMPLEX WITH VITAMIN C) tablet Take 1 tablet by mouth daily.       . benazepril (LOTENSIN) 40 MG tablet Take 1 tablet (40 mg total) by mouth daily. For blood pressure  30 tablet  6  .  furosemide (LASIX) 40 MG tablet Take 120 mg by mouth 2 (two) times daily.      . potassium chloride SA (K-DUR,KLOR-CON) 20 MEQ tablet Take 40 mEq by mouth 2 (two) times daily.       . predniSONE (DELTASONE) 5 MG tablet Take 15 mg by mouth daily.      Marland Kitchen pyridostigmine (MESTINON) 60 MG tablet Take 1 tablet (60 mg total) by mouth 5 (five) times daily.      Marland Kitchen zolpidem (AMBIEN) 10 MG tablet Take 1 tablet (10 mg total) by mouth at bedtime.  30 tablet  5   Family History  Problem Relation Age of Onset  . Breast cancer Mother   . Other Father     Beck's Sarcoid   History   Social History  . Marital Status: Single    Spouse Name: N/A    Number of Children: N/A  . Years of Education: N/A   Occupational History  . Retired     Surveyor, minerals   Social History Main Topics  . Smoking status: Former Smoker -- 2.5 packs/day for 30  years    Quit date: 06/09/1991  . Smokeless tobacco: Never Used  . Alcohol Use: No  . Drug Use: No  . Sexually Active: None   Other Topics Concern  . None   Social History Narrative   Has a single man's lifestyle and will remind anyone of it when asked.Prefers to see only male physicians   Review of Systems: Constitutional: Positive for  appetite change and fatigue.  Denies fever, chills, diaphoresis HEENT: Denies photophobia, eye pain, redness, hearing loss, ear pain, congestion, sore throat, rhinorrhea, sneezing, mouth sores, trouble swallowing, neck pain, neck stiffness and tinnitus.   Respiratory: Positive for DOE. Denies SOB, cough, chest tightness,  and wheezing.   Cardiovascular: Denies chest pain, palpitations and leg swelling.  Gastrointestinal: Positive for nausea, abdominal pain, diarrhea.  Denies vomiting, constipation, blood in stool and abdominal distention.  Genitourinary: Denies dysuria, urgency, frequency, hematuria, flank pain and difficulty urinating.  Musculoskeletal: Denies myalgias, back pain, joint swelling, arthralgias and gait problem.  Skin: Denies pallor, rash and wound.  Neurological: Denies dizziness, seizures, syncope, weakness, light-headedness, numbness and headaches.  Hematological: Denies adenopathy. Easy bruising, personal or family bleeding history  Psychiatric/Behavioral: Denies suicidal ideation, mood changes, confusion, nervousness, sleep disturbance and agitation  Objective:  Physical Exam: Filed Vitals:   01/08/12 1344  BP: 150/78  Pulse: 80  Temp: 97.3 F (36.3 C)  TempSrc: Oral  Height: 5\' 7"  (1.702 m)   Constitutional: Vital signs reviewed.  Patient is a well-developed and well-nourished elderly appearing man in no acute distress and cooperative with exam. Alert and oriented x3.  Head: Normocephalic and atraumatic Ear: TM normal bilaterally Mouth: no erythema or exudates, MMM Eyes: PERRL, EOMI, conjunctivae normal, No scleral  icterus.  Neck: Supple, Trachea midline normal ROM, No JVD, mass, thyromegaly, or carotid bruit present.  Cardiovascular: irregularly irregular rate and rhythm, S1 normal, S2 normal, no MRG, pulses symmetric and intact bilaterally Pulmonary/Chest: CTAB, no wheezes, rales, or rhonchi Abdominal: Soft. Mild bilateral lower abdominal pain to palpation.  non-distended, bowel sounds are hyperactive.  no masses, organomegaly, or guarding present.  GU: no CVA tenderness Musculoskeletal: No joint deformities, erythema, or stiffness, ROM full and no nontender Hematology: no cervical, inginal, or axillary adenopathy.  Neurological: A&O x3, Strength is 4+/5 in both the upper and lower extremities and symmetric bilaterally, cranial nerve II-XII are grossly intact, no focal motor deficit, sensory  intact to light touch bilaterally.  Skin: Warm, dry and intact. No rash, cyanosis, or clubbing.  Psychiatric: Normal mood and affect. speech and behavior is normal. Judgment and thought content normal. Cognition and memory are normal.   Assessment & Plan:

## 2012-01-08 NOTE — Patient Instructions (Addendum)
1.  Start Protonix 40 mg tablets.  Take 1 tablet daily for 14 days.  2.  Stop in the lab and get your test drawn.  We will talk about it at your follow up.  3.  If you notice blood in the stool or start vomiting.  4.  Follow up in 2 weeks.

## 2012-01-08 NOTE — Telephone Encounter (Signed)
Patient advised that I will place him on the cancellation list.  He is agreeable to this

## 2012-01-16 ENCOUNTER — Other Ambulatory Visit: Payer: Self-pay | Admitting: Internal Medicine

## 2012-01-18 ENCOUNTER — Telehealth: Payer: Self-pay | Admitting: Internal Medicine

## 2012-01-18 MED ORDER — FUROSEMIDE 40 MG PO TABS: 120.0000 mg | ORAL_TABLET | Freq: Two times a day (BID) | ORAL | Status: DC

## 2012-01-18 NOTE — Telephone Encounter (Signed)
Patient advised that at this time I don't have any earlier appts and that I have confirmed he is on the cancellation list.

## 2012-01-26 ENCOUNTER — Other Ambulatory Visit: Payer: Self-pay | Admitting: *Deleted

## 2012-01-26 DIAGNOSIS — K2970 Gastritis, unspecified, without bleeding: Secondary | ICD-10-CM

## 2012-01-26 MED ORDER — OXYCODONE HCL 5 MG PO TABS: 5.0000 mg | ORAL_TABLET | ORAL | Status: DC | PRN

## 2012-01-26 NOTE — Telephone Encounter (Signed)
If he is taking them twice daily that's fine.  I will give him another 30.  Thanks for making sure that he remember his appointment.

## 2012-01-26 NOTE — Telephone Encounter (Signed)
Pt would like for the amount to be increased monthly, possibly 60 i am thinking, he states he has 1 or 2 pills left, the 30 days will not be up til 9/1, could he get 12 to 24 pills to last til the 30 days is up? Then start getting #60/ mon. States this works really well. He has an appt w/ you coming up and i will stress for him to keep it.

## 2012-01-29 ENCOUNTER — Ambulatory Visit (INDEPENDENT_AMBULATORY_CARE_PROVIDER_SITE_OTHER): Payer: Medicare Other | Admitting: Internal Medicine

## 2012-01-29 DIAGNOSIS — G7000 Myasthenia gravis without (acute) exacerbation: Secondary | ICD-10-CM

## 2012-01-29 DIAGNOSIS — E119 Type 2 diabetes mellitus without complications: Secondary | ICD-10-CM

## 2012-01-29 DIAGNOSIS — K2990 Gastroduodenitis, unspecified, without bleeding: Secondary | ICD-10-CM

## 2012-01-29 DIAGNOSIS — K2970 Gastritis, unspecified, without bleeding: Secondary | ICD-10-CM

## 2012-01-29 DIAGNOSIS — I5022 Chronic systolic (congestive) heart failure: Secondary | ICD-10-CM

## 2012-01-29 MED ORDER — PANTOPRAZOLE SODIUM 40 MG PO TBEC: 40.0000 mg | DELAYED_RELEASE_TABLET | Freq: Every day | ORAL | Status: DC

## 2012-01-29 MED ORDER — OXYCODONE HCL 5 MG PO TABS: 5.0000 mg | ORAL_TABLET | Freq: Two times a day (BID) | ORAL | Status: DC | PRN

## 2012-01-29 NOTE — Progress Notes (Signed)
Subjective:   Patient ID: [name] male   DOB: [birth year] 66 y.o.   MRN: [id number]  HPI: [name] is a 66 y.o. man who is here for follow up from his last appointment.  He states that he is doing much better then before.  He states that the stomach pain is much better but still there mildly and he continues to have loose BM but no more watery diarrhea.  He states that he is in process of getting an appointment with Dr. Leone Payor from [place] on 9/4.    He still has not started the Cellcept for his MG because he states that he doesn't want to have diarrhea from two sources at once.  He continues to lose weight and muscle mass and states he is weak especially in his legs.  He continues to follow with his doctors over at [facility] who continue to push him to start the Cellcept.  He has the medication but has not started it.    He also has another appointment with his cardiologist at [facility] and states that they are setting him up for a heart catheterization soon.  He continues to have times where he feels his heart racing but they are short periods and pass on their own.  He denies chest pain, or orthopnea but has some swelling in his legs to the knees.    He is not checking his blood sugars and refuses A1C as well as CBG checks in the office stating "I know they are going to be high because of the prednisone."  He states that he is still considering starting the Lantus when he is able to get off the prednisone.  He continues to state that he knows his body does not function with a blood sugar less then 250-300.  He continues to have polyuria and "salt cravings."    Past Medical History  Diagnosis Date  . Sinus tachycardia 2006    a. Nonobstructive CAD by cath 2013. b. s/p atrial tachycardia ablation 08/2011 (sinus node modification)  . Orthostatic hypotension   . Unspecified essential hypertension   . Hyperlipidemia   . Myasthenia gravis     Status post thymectomy April 2012, hx of plasmapheresis   . Cervical spondylosis with radiculopathy   . Syrinx     T5-T6  . Cord compression     Cord compression syndrome C5-C6, C6-C7  . ADD (attention deficit disorder)   . Decreased libido   . Depression   . DM (diabetes mellitus)     adult onset  . Sleep apnea     occassionally uses cpap  . Right bundle branch block and left posterior fascicular block   . Pulmonary nodule, left     Evaluated 04/2011 by pulmonology - felt to be benign granuloma  . Peripheral edema   . 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 LV dysfunction     By echo 2013 at [facility] - reported EF 45% per pt  . Colitis     with surface exudate  . Hemorrhoids     small  . Pseudomembranous colitis    Current Outpatient Prescriptions  Medication Sig Dispense Refill  . benazepril (LOTENSIN) 40 MG tablet Take 1 tablet (40 mg total) by mouth daily. For blood pressure  30 tablet  6  . furosemide (LASIX) 40 MG tablet Take 120 mg by mouth 2 (two) times daily.      . metolazone (  ZAROXOLYN) 5 MG tablet Take 1 tablet by mouth 2 (two) times a week. Tuesday & Saturday      . mycophenolate (CELLCEPT) 500 MG tablet Take 1,000 mg by mouth 2 (two) times daily.      Marland Kitchen oxyCODONE (OXY IR/ROXICODONE) 5 MG immediate release tablet Take 5 mg by mouth every 8 (eight) hours as needed.      . pantoprazole (PROTONIX) 40 MG tablet Take 40 mg by mouth daily.      . potassium chloride SA (K-DUR,KLOR-CON) 20 MEQ tablet Take 40 mEq by mouth 2 (two) times daily.       . predniSONE (DELTASONE) 5 MG tablet Take 15 mg by mouth daily.      Marland Kitchen pyridostigmine (MESTINON) 60 MG tablet Take 1 tablet (60 mg total) by mouth 5 (five) times daily.      Marland Kitchen zolpidem (AMBIEN) 10 MG tablet Take 1 tablet (10 mg total) by mouth at bedtime.  30 tablet  5  . DISCONTD: pantoprazole (PROTONIX) 40 MG tablet Take 1 tablet (40 mg total) by mouth daily.  30 tablet  2   Family History  Problem Relation Age of Onset  . Breast cancer Mother   .  Other Father     Beck's Sarcoid  . Colon cancer Neg Hx   . Prostate cancer Neg Hx    History   Social History  . Marital Status: Single    Spouse Name: N/A    Number of Children: 1  . Years of Education: N/A   Occupational History  . Retired     Surveyor, minerals   Social History Main Topics  . Smoking status: Former Smoker -- 2.5 packs/day for 30 years    Quit date: 06/09/1991  . Smokeless tobacco: Never Used  . Alcohol Use: No  . Drug Use: No  . Sexually Active: Not on file   Other Topics Concern  . 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  HEENT: Denies photophobia, eye pain, redness, hearing loss, ear pain, congestion, sore throat, rhinorrhea, sneezing, mouth sores, trouble swallowing, neck pain, neck stiffness and tinnitus.   Respiratory: Denies SOB, DOE, cough, chest tightness,  and wheezing.   Cardiovascular: Denies chest pain, palpitations and leg swelling.  Gastrointestinal: Denies nausea, vomiting, abdominal pain, diarrhea, constipation, blood in stool and abdominal distention.  Genitourinary: Denies dysuria, urgency, frequency, hematuria, flank pain and difficulty urinating.  Musculoskeletal: Denies myalgias, back pain, joint swelling, arthralgias and gait problem.  Skin: Denies pallor, rash and wound.  Neurological: Denies dizziness, seizures, syncope, weakness, light-headedness, numbness and headaches.  Hematological: Denies adenopathy. Easy bruising, personal or family bleeding history  Psychiatric/Behavioral: Denies suicidal ideation, mood changes, confusion, nervousness, sleep disturbance and agitation  Objective:  Physical Exam: Constitutional: Patient is a chronically ill appearing man in no acute distress and cooperative with exam. Alert  and oriented x3.  Head: Normocephalic and atraumatic Ear: TM normal bilaterally Mouth: no erythema or exudates, MMM Eyes: PERRL, EOMI, conjunctivae normal, No scleral icterus.  Neck: Supple, Trachea midline normal ROM, No JVD, mass, thyromegaly, or carotid bruit present.  Cardiovascular: RRR with occasional ectopic beats.  S1 normal, S2 normal, no MRG, pulses symmetric and intact bilaterally Pulmonary/Chest: CTAB, no wheezes, rales, or rhonchi Abdominal: Soft. Mild epigastric tenderness and bilateral lower quadrant tenderness  to palpation.  non-distended, bowel sounds are normal, no masses, organomegaly, or guarding present.  GU: no CVA tenderness Musculoskeletal: No joint deformities, erythema, or stiffness, ROM full and no nontender Hematology: no cervical, inginal, or axillary adenopathy.  Neurological: A&O x3, Strength is diminished in the lower extremities bilaterally, cranial nerve II-XII are grossly intact, no focal motor deficit, sensory intact to light touch bilaterally.  Skin: Warm, dry and intact. No rash, cyanosis, or clubbing.  Psychiatric: Normal mood and affect. speech and behavior is normal. Judgment and thought content normal. Insight is poor.  Cognition and memory are normal.   Assessment & Plan:

## 2012-01-29 NOTE — Patient Instructions (Signed)
1. Continue the Protonix and the Oxycodone as directed.  2.  Continue working to get your appointment with Dr. Leone Payor.  3. Make sure the [facility] guys send me a copy of their note.  4.  Follow up in about a month.

## 2012-02-01 ENCOUNTER — Telehealth: Payer: Self-pay | Admitting: *Deleted

## 2012-02-01 DIAGNOSIS — I5022 Chronic systolic (congestive) heart failure: Secondary | ICD-10-CM

## 2012-02-01 NOTE — Telephone Encounter (Signed)
Pt calls and states he didn't get his lab work done Friday, he would like "kidney function and potassium tests" will you order these?

## 2012-02-02 NOTE — Telephone Encounter (Signed)
Sure I had forgotten to get that done plus it was after 5 before he left last time.  I put it in as a future so he can come in this week and get it done.

## 2012-02-04 ENCOUNTER — Other Ambulatory Visit: Payer: Medicare Other

## 2012-02-05 ENCOUNTER — Other Ambulatory Visit (INDEPENDENT_AMBULATORY_CARE_PROVIDER_SITE_OTHER): Payer: Medicare Other

## 2012-02-05 DIAGNOSIS — I5022 Chronic systolic (congestive) heart failure: Secondary | ICD-10-CM

## 2012-02-05 LAB — BASIC METABOLIC PANEL
CO2: 35 mEq/L — ABNORMAL HIGH (ref 19–32)
Calcium: 8.7 mg/dL (ref 8.4–10.5)
Creat: 0.92 mg/dL (ref 0.50–1.35)
Glucose, Bld: 264 mg/dL — ABNORMAL HIGH (ref 70–99)
Sodium: 141 mEq/L (ref 135–145)

## 2012-02-10 ENCOUNTER — Ambulatory Visit (INDEPENDENT_AMBULATORY_CARE_PROVIDER_SITE_OTHER): Payer: Medicare Other | Admitting: Internal Medicine

## 2012-02-10 ENCOUNTER — Encounter: Payer: Self-pay | Admitting: Internal Medicine

## 2012-02-10 ENCOUNTER — Telehealth: Payer: Self-pay

## 2012-02-10 VITALS — BP 150/78 | HR 80 | Ht 67.0 in | Wt 188.0 lb

## 2012-02-10 DIAGNOSIS — R194 Change in bowel habit: Secondary | ICD-10-CM

## 2012-02-10 DIAGNOSIS — R198 Other specified symptoms and signs involving the digestive system and abdomen: Secondary | ICD-10-CM

## 2012-02-10 DIAGNOSIS — R109 Unspecified abdominal pain: Secondary | ICD-10-CM

## 2012-02-10 DIAGNOSIS — A0472 Enterocolitis due to Clostridium difficile, not specified as recurrent: Secondary | ICD-10-CM

## 2012-02-10 NOTE — Patient Instructions (Addendum)
Your physician has requested that you go to the basement for the following lab work before leaving today: Stool studies  Once Dr. Leone Payor has reviewed your lab results we will be in touch with treatment plans.  Thank you for choosing me and [facility].  Iva Boop, M.D., [facility]

## 2012-02-10 NOTE — Progress Notes (Signed)
Subjective:    Patient ID: [name], male    DOB: 02-06-1946, 66 y.o.   MRN: [id number] Referred by: Leodis Sias, MD  HPI The patient is an elderly white man with recent troubles with abdominal pain and diarrhea. In April he was in the emergency department and was treated with antibiotics for cellulitis. About 6 weeks later he was reporting abdominal pain and diarrhea as well as loss of appetite. CT scan showed a diffuse colitis. He also had ascites. I have reviewed the images. He subsequently went to see Dr. Elnoria Howard who performed a sigmoidoscopy showed pseudomembranes. I think the plan was for a colonoscopy but there was some sort of heart rhythm disturbance so this procedure was done without sedation. The patient felt like he was not communicating properly with Dr. Elnoria Howard in that Dr. Elnoria Howard would not listen to him so he has to change providers. He was treated with Xifaxan and had improvement but not resolution of his bowel symptoms. I cannot tell that is ever had a Clostridium difficile toxin checked.  He continues with mid abdominal pain and cramps, loss of appetite, he says he has lost 50 pounds over the several months he's been ill, and his stools are soft and small and great. He moves his bowels about twice a day. He is not reporting fevers. He does not have bleeding.  He has myasthenia gravis and limited mobility in his legs. He is supposed to start CellCept but because of the possibility of diarrhea he has been reluctant to do so.  His GI review of systems is otherwise negative.  He is very fearful of needles and says he hardly has any veins left and wants to have anesthesia medication, local prior to any IVs. He wants to be completely out for any procedure.  Medications, allergies, past medical history, past surgical history, family history and social history are reviewed and updated in the EMR.  Review of Systems Other for those things mentioned in history of present illness. He  has limited mobility with the legs. He doesn't sleep well, last he uses hypnotic. All other review of systems are negative.    Objective:   Physical Exam General:  Well-developed, well-nourished and in no acute distress Eyes:  anicteric. ENT:   Mouth and posterior pharynx free of lesions.  Neck:   supple w/o thyromegaly or mass.  Lungs: Clear to auscultation bilaterally. Heart:  S1S2, no rubs, murmurs, gallops. Abdomen:  soft, non-tender, no hepatosplenomegaly, hernia, or mass and BS+.  Rectal: Lymph:  no cervical or supraclavicular adenopathy. Extremities:   3+ brawny edema bilateral legs to mid-pretibial Skin   no rash., scar upper chest Neuro:  A&O x 3.  Psych:  Mildly anxious   Data Reviewed:        Assessment & Plan:   1. Pseudomembranous colitis   2. Abdominal pain of multiple sites   3. Change in bowel habits    1. Certainly seems likely that he had Clostridium difficile colitis, question if he has persistent problems are a postinfection IBS. 2. He could have some type of underlying chronic colitis as well. 3. My current plan is for him to do fecal white cells and stool for C. difficile PCR. If the PCR is positive I would be inclined to treat him again for C. difficile to see how his symptoms responded. 4. Subsequent to that, or in the setting of a negative C. difficile PCR would proceed to colonoscopy. 5. I think he will need  monitored anesthesia care at least, and a preadmission testing visit to sort out his specific request regarding IVs and because of his myasthenia gravis. Would anticipate performing this procedure at the hospital.  NF:[id number], MD

## 2012-02-10 NOTE — Telephone Encounter (Signed)
LM for pt,  He voiced concerns about his appetite returning when he was in the office.  We need to complete the stool tests and go forward from there.

## 2012-02-15 ENCOUNTER — Other Ambulatory Visit: Payer: Medicare Other

## 2012-02-15 DIAGNOSIS — A0472 Enterocolitis due to Clostridium difficile, not specified as recurrent: Secondary | ICD-10-CM

## 2012-02-15 DIAGNOSIS — A498 Other bacterial infections of unspecified site: Secondary | ICD-10-CM

## 2012-02-15 HISTORY — DX: Other bacterial infections of unspecified site: A49.8

## 2012-02-16 LAB — CLOSTRIDIUM DIFFICILE BY PCR: Toxigenic C. Difficile by PCR: DETECTED — CR

## 2012-02-16 NOTE — Progress Notes (Signed)
Quick Note:  [name],   C diff PCR is + Needs vancomycin 125 mg 4 times a day for 2 weeks no refills  Follow-up with me in 4 weeks  If not better at 2 weeks (end of Tx) call back then  I have cced his PCP on this   ______

## 2012-02-17 ENCOUNTER — Other Ambulatory Visit: Payer: Self-pay

## 2012-02-17 MED ORDER — VANCOMYCIN HCL 125 MG PO CAPS: 125.0000 mg | ORAL_CAPSULE | Freq: Four times a day (QID) | ORAL | Status: AC

## 2012-02-19 NOTE — Assessment & Plan Note (Signed)
He still has not started his Cellcept at this time because of the fear of side effects.  "Once we get this taken care of with the lower abdomen I will start it."    He continues to be followed by [facility] for this.  He will bring in a copy of the lists of "safe" medications for his MG and I will scan it for future reference.

## 2012-02-19 NOTE — Assessment & Plan Note (Signed)
He professes that he uses "Alot" of ibuprofen for the pain in his stomach.  Though he was recently treated for C. Diff and that this could represent a recurrence of that there also appears to be a component of a gastritis with this.  While it raises his risk of recurrent C. Diff we will plan on starting him on Protonix today for a two week course to see if that helps the stomach pain.  I also advised him to not use any Ibuprofen, aleve, advil, motrin, BCs or Goody powders for this pain.  Because of the pain we will give him a supply of Oxycodone to take for the pain instead.

## 2012-02-19 NOTE — Assessment & Plan Note (Signed)
He refused CBG as well as A1C today stating "I know its still going to be high because it has been and I haven't done anything to make it better."  We discussed that he needs to gets started on his Lantus to help get some control over his blood sugars and he states that he will once he can get off the prednisone.  We will need to continue to educate him on the consequences of poorly controlled diabetes.

## 2012-02-19 NOTE — Assessment & Plan Note (Signed)
BP Readings from Last 6 Encounters:  01/08/12 150/78  12/25/11 150/97  12/16/11 148/66  12/14/11 153/98  11/13/11 139/78   His blood pressure remains slightly elevated and he is resistant to changing his medication regimen because he is fearful of the interactions with his myasthenia.  He continues to have some mild swelling in his legs.  I encouraged him to continue taking his medications and to follow a low salt diet to help with his blood pressure.

## 2012-02-24 ENCOUNTER — Ambulatory Visit (INDEPENDENT_AMBULATORY_CARE_PROVIDER_SITE_OTHER): Payer: Medicare Other | Admitting: Internal Medicine

## 2012-02-24 ENCOUNTER — Encounter: Payer: Self-pay | Admitting: Internal Medicine

## 2012-02-24 VITALS — BP 141/84 | HR 72 | Temp 97.6°F

## 2012-02-24 DIAGNOSIS — E119 Type 2 diabetes mellitus without complications: Secondary | ICD-10-CM

## 2012-02-24 DIAGNOSIS — K2970 Gastritis, unspecified, without bleeding: Secondary | ICD-10-CM

## 2012-02-24 DIAGNOSIS — I10 Essential (primary) hypertension: Secondary | ICD-10-CM

## 2012-02-24 DIAGNOSIS — G7000 Myasthenia gravis without (acute) exacerbation: Secondary | ICD-10-CM

## 2012-02-24 MED ORDER — OXYCODONE HCL 5 MG PO TABS: 5.0000 mg | ORAL_TABLET | Freq: Three times a day (TID) | ORAL | Status: DC | PRN

## 2012-02-24 NOTE — Progress Notes (Signed)
Subjective:   Patient ID: [name] male   DOB: [birth year] 66 y.o.   MRN: [id number]  HPI: [name] is a 66 y.o. man who presents to clinic for follow up on his chronic medical conditions including diabetes, myasthenia gravis, and hypertension.  See Problem focused Assessment and Plan for full details of his chronic medical conditions.   He continues to use ibuprofen for his abdominal and back pain.  He states that no one ever told him to not take the medication.  He has seen Dr. Leone Payor about 1 week ago and there are plans to check to see if he continues to have C. Diff as well as possible EGD for gastritis.  He states that he continues to have nausea and marked anorexia, stating that he can only eat certain things like crackers, cheese, or sweet rolls.  He denies vomiting, blood in his stools, fever, or chills.    Past Medical History  Diagnosis Date  . Sinus tachycardia 2006    a. Nonobstructive CAD by cath 2013. b. s/p atrial tachycardia ablation 08/2011 (sinus node modification)  . Orthostatic hypotension   . Unspecified essential hypertension   . Hyperlipidemia   . 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 Cervical spondylosis with radiculopathy   . Syrinx     T5-T6  . Cord compression     Cord compression syndrome C5-C6, C6-C7  . ADD (attention deficit disorder)   . Decreased libido   . Depression   . DM (diabetes mellitus)     adult onset  . Sleep apnea     occassionally uses cpap  . Right bundle branch block and left posterior fascicular block   . Pulmonary nodule, left     Evaluated 04/2011 by pulmonology - felt to be benign granuloma  . Peripheral edema   . 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 LV dysfunction     By echo 2013 at [facility] - reported EF 45% per pt  . Colitis     with surface exudate  . Hemorrhoids     small  . Pseudomembranous colitis    Current Outpatient  Prescriptions  Medication Sig Dispense Refill  . benazepril (LOTENSIN) 40 MG tablet Take 1 tablet (40 mg total) by mouth daily. For blood pressure  30 tablet  6  . furosemide (LASIX) 40 MG tablet Take 3 tablets (120 mg total) by mouth 2 (two) times daily.  180 tablet  3  . metolazone (ZAROXOLYN) 5 MG tablet Take 1 tablet by mouth 2 (two) times a week.      . mycophenolate (CELLCEPT) 500 MG tablet Take 1,000 mg by mouth 2 (two) times daily.      Marland Kitchen oxyCODONE (OXY IR/ROXICODONE) 5 MG immediate release tablet Take 1 tablet (5 mg total) by mouth every 12 (twelve) hours as needed for pain.  60 tablet  0  . pantoprazole (PROTONIX) 40 MG tablet Take 1 tablet (40 mg total) by mouth daily.  30 tablet  2  . potassium chloride SA (K-DUR,KLOR-CON) 20 MEQ tablet Take 40 mEq by mouth 2 (two) times daily.       . predniSONE (DELTASONE) 5 MG tablet Take 15 mg by mouth daily.      Marland Kitchen pyridostigmine (MESTINON) 60 MG tablet Take 1 tablet (60 mg total) by mouth 5 (five) times daily.      . vancomycin (VANCOCIN)  125 MG capsule Take 1 capsule (125 mg total) by mouth 4 (four) times daily.  56 capsule  0  . zolpidem (AMBIEN) 10 MG tablet Take 1 tablet (10 mg total) by mouth at bedtime.  30 tablet  5   Family History  Problem Relation Age of Onset  . Breast cancer Mother   . Other Father     Beck's Sarcoid  . Colon cancer Neg Hx   . Prostate cancer Neg Hx    History   Social History  . Marital Status: Single    Spouse Name: N/A    Number of Children: 1  . Years of Education: N/A   Occupational History  . Retired     Surveyor, minerals   Social History Main Topics  . Smoking status: Former Smoker -- 2.5 packs/day for 30 years    Quit date: 06/09/1991  . Smokeless tobacco: Never Used  . Alcohol Use: No  . Drug Use: No  . Sexually Active: Not on file   Other Topics Concern  . 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  HEENT: Denies photophobia, eye pain, redness, hearing loss, ear pain, congestion, sore throat, rhinorrhea, sneezing, mouth sores, trouble swallowing, neck pain, neck stiffness and tinnitus.   Respiratory: Denies SOB, DOE, cough, chest tightness,  and wheezing.   Cardiovascular: Denies chest pain, palpitations and leg swelling.  Gastrointestinal: Denies nausea, vomiting, abdominal pain, diarrhea, constipation, blood in stool and abdominal distention.  Genitourinary: Denies dysuria, urgency, frequency, hematuria, flank pain and difficulty urinating.  Musculoskeletal: Denies myalgias, back pain, joint swelling, arthralgias and gait problem.  Skin: Denies pallor, rash and wound.  Neurological: Denies dizziness, seizures, syncope, weakness, light-headedness, numbness and headaches.  Hematological: Denies adenopathy. Easy bruising, personal or family bleeding history  Psychiatric/Behavioral: Denies suicidal ideation, mood changes, confusion, nervousness, sleep disturbance and agitation  Objective:  Physical Exam: Filed Vitals:   02/24/12 1120  BP: 141/84  Pulse: 72  Temp: 97.6 F (36.4 C)  TempSrc: Oral   Constitutional: Vital signs reviewed.  Patient is a well-developed and well-nourished man in no acute distress and cooperative with exam. Alert and oriented x3.  Head: Normocephalic and atraumatic Ear: TM normal bilaterally Mouth: no erythema or exudates, MMM Eyes: PERRL, EOMI, no ptosis noted.  conjunctivae normal, No scleral icterus.  Neck: Supple, Trachea midline normal ROM, No JVD, mass, thyromegaly, or carotid bruit present.  Cardiovascular: RRR, S1 normal, S2 normal, no MRG, pulses symmetric and intact bilaterally Pulmonary/Chest: CTAB, no wheezes, rales, or rhonchi Abdominal: Soft. Mild epigastric tenderness to palpation in the bilateral lower  quadrants, non-distended, bowel sounds are normal, no masses, organomegaly, or guarding present.  GU: no CVA tenderness Musculoskeletal: No joint deformities, erythema, or stiffness, ROM full and no nontender Hematology: no cervical, inginal, or axillary adenopathy.  Neurological: A&O x3, Strength is normal and symmetric bilaterally, cranial nerve II-XII are grossly intact, no focal motor deficit, sensory intact to light touch bilaterally.  Skin: Warm, dry and intact. No rash, cyanosis, or clubbing.  Psychiatric: Normal mood and affect. speech and behavior is normal. Judgment and thought content normal. Cognition and memory are normal.   Assessment & Plan:

## 2012-02-24 NOTE — Patient Instructions (Signed)
1.  Make sure you start the Cellcept.  Now is time to start the clock on that so we can stop the down sloping of your weakness and energy  2.  Continue to take your medications as prescribed for now.  3. Follow up with me in the middle of November to see how your doing.

## 2012-03-01 ENCOUNTER — Telehealth: Payer: Self-pay | Admitting: Internal Medicine

## 2012-03-01 NOTE — Telephone Encounter (Signed)
Left message for patient to call back  

## 2012-03-01 NOTE — Telephone Encounter (Signed)
I spoke with the patient. He states that Dr. Herbert Deaner sent him to another cardiologist at [facility] for what I think must have been a right heart cath. He was asking if we had received records on him. I advised that I had not seen any records from [facility] on him recently. He states that he has been told that he has pulmonary HTN. He had concerns about his EF as well. He states the last one he had done was around 42-45 %. He does not know what led to the most recent heart cath. He asked me to call and get his records. I explained he will need to sign a release before we can get these records. He will either come by and sign a release to send to [place], or he will call [facility] to fax records to Dr. Graciela Husbands for review.

## 2012-03-01 NOTE — Telephone Encounter (Signed)
I see no reason not to start Cellcept from GI  Can schedule a non-urgent follow-up visit and we can discuss the LLQ pain and colonoscopy

## 2012-03-01 NOTE — Telephone Encounter (Signed)
plz return call to patient [phone] regarding medical questions

## 2012-03-01 NOTE — Telephone Encounter (Signed)
Patient reports that he is having continued LLQ abdominal pain.  He notes that when he is hungry the pain worsens.  He has 2 days left of the vancomycin.  He reports he is no longer having having diarrhea, his stools are formed.  He needs to start on Cellcept is this ok? Please advise

## 2012-03-02 NOTE — Telephone Encounter (Signed)
Patient advised.  He has an REV scheduled for 03/17/12 and he will keep the appt

## 2012-03-11 ENCOUNTER — Emergency Department (HOSPITAL_COMMUNITY)
Admission: EM | Admit: 2012-03-11 | Discharge: 2012-03-11 | Disposition: A | Discharge status: Home / Self Care | Payer: Medicare Other | Attending: Emergency Medicine | Admitting: Emergency Medicine

## 2012-03-11 ENCOUNTER — Encounter (HOSPITAL_COMMUNITY): Payer: Self-pay | Admitting: *Deleted

## 2012-03-11 ENCOUNTER — Emergency Department (HOSPITAL_COMMUNITY): Payer: Medicare Other

## 2012-03-11 DIAGNOSIS — E119 Type 2 diabetes mellitus without complications: Secondary | ICD-10-CM | POA: Insufficient documentation

## 2012-03-11 DIAGNOSIS — E785 Hyperlipidemia, unspecified: Secondary | ICD-10-CM | POA: Insufficient documentation

## 2012-03-11 DIAGNOSIS — Z87891 Personal history of nicotine dependence: Secondary | ICD-10-CM | POA: Insufficient documentation

## 2012-03-11 DIAGNOSIS — Y998 Other external cause status: Secondary | ICD-10-CM | POA: Insufficient documentation

## 2012-03-11 DIAGNOSIS — F988 Other specified behavioral and emotional disorders with onset usually occurring in childhood and adolescence: Secondary | ICD-10-CM | POA: Insufficient documentation

## 2012-03-11 DIAGNOSIS — I2510 Atherosclerotic heart disease of native coronary artery without angina pectoris: Secondary | ICD-10-CM | POA: Insufficient documentation

## 2012-03-11 DIAGNOSIS — I10 Essential (primary) hypertension: Secondary | ICD-10-CM | POA: Insufficient documentation

## 2012-03-11 DIAGNOSIS — G7000 Myasthenia gravis without (acute) exacerbation: Secondary | ICD-10-CM | POA: Insufficient documentation

## 2012-03-11 DIAGNOSIS — S20219A Contusion of unspecified front wall of thorax, initial encounter: Secondary | ICD-10-CM | POA: Insufficient documentation

## 2012-03-11 DIAGNOSIS — W19XXXA Unspecified fall, initial encounter: Secondary | ICD-10-CM | POA: Insufficient documentation

## 2012-03-11 DIAGNOSIS — Y9301 Activity, walking, marching and hiking: Secondary | ICD-10-CM | POA: Insufficient documentation

## 2012-03-11 DIAGNOSIS — M47812 Spondylosis without myelopathy or radiculopathy, cervical region: Secondary | ICD-10-CM | POA: Insufficient documentation

## 2012-03-11 DIAGNOSIS — G4730 Sleep apnea, unspecified: Secondary | ICD-10-CM | POA: Insufficient documentation

## 2012-03-11 NOTE — ED Provider Notes (Signed)
66 year old male with history of myasthenia gravis states that his leg got weak and that he tripped over his feet and fell injuring his left rib cage. He also suffered minor breaches to his left forearm. Exam, he has moderate tenderness of the left lateral rib cage. Minor abrasions are seen in the left forearm. No other injury is seen. X-rays are negative for fracture and he will be treated symptomatically.  I saw and evaluated the patient, reviewed the resident's note and I agree with the findings and plan.   Dione Booze, MD 03/11/12 [phone]

## 2012-03-11 NOTE — ED Provider Notes (Signed)
History     CSN: [id number]  Arrival date & time 03/11/12  1417   First MD Initiated Contact with Patient 03/11/12 1506      Chief Complaint  Patient presents with  . Fall    (Consider location/radiation/quality/duration/timing/severity/associated sxs/prior treatment)  Patient is a 66 y.o. male presenting with fall.  Fall The accident occurred 1 to 2 hours ago. The fall occurred while walking. Distance fallen: from standing. He landed on concrete. There was no blood loss. The point of impact was the left elbow (L lateral chest). Pain location: L lateral chest. The pain is at a severity of 5/10. The pain is mild. He was ambulatory at the scene. There was no entrapment after the fall. There was no alcohol use involved in the accident. Pertinent negatives include no visual change, no fever, no numbness, no abdominal pain, no bowel incontinence, no nausea, no vomiting, no hematuria, no headaches, no hearing loss, no loss of consciousness and no tingling. He has tried nothing for the symptoms.    66 yo M with pmh of MG, CHF, DM, and IBS who presents via EMS for a fall. Patient reports gradual development of weakness to RLE and then tripping on his foot.  Similar episode in past which he attributed to his MG.  No head ache, sob, chest pain , palpitations prior to the fall. He denies loc. Denies trauma to head. No current HA or neck pain. Not on blood thinners. Reports pain to L lateral chest wall.   Past Medical History  Diagnosis Date  . Sinus tachycardia 2006    a. Nonobstructive CAD by cath 2013. b. s/p atrial tachycardia ablation 08/2011 (sinus node modification)  . Orthostatic hypotension   . Unspecified essential hypertension   . Hyperlipidemia   . 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 Cervical spondylosis with radiculopathy   . Syrinx     T5-T6  . Cord compression     Cord compression syndrome C5-C6, C6-C7  . ADD (attention deficit  disorder)   . Decreased libido   . Depression   . DM (diabetes mellitus)     adult onset  . Sleep apnea     occassionally uses cpap  . Right bundle branch block and left posterior fascicular block   . Pulmonary nodule, left     Evaluated 04/2011 by pulmonology - felt to be benign granuloma  . Peripheral edema   . 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 LV dysfunction     By echo 2013 at [facility] - reported EF 45% per pt  . Colitis     with surface exudate  . Hemorrhoids     small  . Pseudomembranous colitis     Past Surgical History  Procedure Date  . Thymectomy april 2012  . Colonoscopy 11/13/2011    Procedure: COLONOSCOPY;  Surgeon: Theda Belfast, MD;  Location: [facility];  Service: Endoscopy;  Laterality: N/A;  . Cardiac catheterization     Family History  Problem Relation Age of Onset  . Breast cancer Mother   . Other Father     Beck's Sarcoid  . Colon cancer Neg Hx   . Prostate cancer Neg Hx     History  Substance Use Topics  . Smoking status: Former Smoker -- 2.5 packs/day for 30 years    Quit date: 06/09/1991  . Smokeless tobacco: Never Used  .  Alcohol Use: No      Review of Systems  Constitutional: Negative for fever, activity change and appetite change.  HENT: Negative for ear pain, congestion, sore throat, rhinorrhea, neck pain, neck stiffness and sinus pressure.   Eyes: Negative for pain and redness.  Respiratory: Negative for cough, chest tightness and shortness of breath.   Cardiovascular: Negative for chest pain and palpitations.  Gastrointestinal: Negative for nausea, vomiting, abdominal pain, diarrhea, abdominal distention and bowel incontinence.  Genitourinary: Negative for dysuria, hematuria, flank pain and difficulty urinating.  Musculoskeletal: Negative for back pain.        L lateral chest pain   Skin: Negative for rash and wound.  Neurological: Negative for dizziness, tingling, loss of consciousness,  light-headedness, numbness and headaches.  Hematological: Negative for adenopathy.  Psychiatric/Behavioral: Negative for behavioral problems, confusion and agitation.    Allergies  Aminoglycosides; Beta adrenergic blockers; Calcium channel blockers; Iodinated diagnostic agents; Metoprolol; Neuromuscular blocking agents; Other; Penicillins; and Quinine derivatives  Home Medications   Current Outpatient Rx  Name Route Sig Dispense Refill  . BENAZEPRIL HCL 40 MG PO TABS Oral Take 1 tablet (40 mg total) by mouth daily. For blood pressure 30 tablet 6  . FUROSEMIDE 40 MG PO TABS Oral Take 120 mg by mouth 2 (two) times daily.    Marland Kitchen METOLAZONE 5 MG PO TABS Oral Take 1 tablet by mouth 2 (two) times a week. Tuesday & Saturday    . MYCOPHENOLATE MOFETIL 500 MG PO TABS Oral Take 1,000 mg by mouth 2 (two) times daily.    . OXYCODONE HCL 5 MG PO TABS Oral Take 5 mg by mouth every 8 (eight) hours as needed.    Marland Kitchen PANTOPRAZOLE SODIUM 40 MG PO TBEC Oral Take 40 mg by mouth daily.    Marland Kitchen POTASSIUM CHLORIDE CRYS ER 20 MEQ PO TBCR Oral Take 40 mEq by mouth 2 (two) times daily.     Marland Kitchen PREDNISONE 5 MG PO TABS Oral Take 15 mg by mouth daily.    Marland Kitchen PYRIDOSTIGMINE BROMIDE 60 MG PO TABS Oral Take 1 tablet (60 mg total) by mouth 5 (five) times daily.    Marland Kitchen ZOLPIDEM TARTRATE 10 MG PO TABS Oral Take 1 tablet (10 mg total) by mouth at bedtime. 30 tablet 5    BP 131/76  Pulse 56  Temp 98 F (36.7 C)  Resp 20  SpO2 94%  Physical Exam  Constitutional: He is oriented to person, place, and time. He appears well-developed and well-nourished. No distress.  HENT:  Head: Normocephalic and atraumatic.  Nose: Nose normal.  Mouth/Throat: Oropharynx is clear and moist.  Eyes: Conjunctivae normal and EOM are normal. Pupils are equal, round, and reactive to light.  Neck: Normal range of motion. Neck supple. No tracheal deviation present.  Cardiovascular: Normal rate, regular rhythm, normal heart sounds and intact distal pulses.    Pulmonary/Chest: Effort normal and breath sounds normal. No respiratory distress. He has no wheezes. He has no rales. He exhibits tenderness (L chest wall ttp).  Abdominal: Soft. Bowel sounds are normal. He exhibits no distension. There is no tenderness. There is no rebound and no guarding.  Musculoskeletal: Normal range of motion. He exhibits edema (bilateral pitting edema to mid calf). He exhibits no tenderness.  Neurological: He is alert and oriented to person, place, and time.  Skin: Skin is warm and dry.  Psychiatric: He has a normal mood and affect. His behavior is normal.    ED Course  Procedures (including critical  care time)     DG Ribs Unilateral W/Chest Left (Final result)   Result time:03/11/12 1607    Final result by Rad Results In Interface (03/11/12 16:07:23)    Narrative:   *RADIOLOGY REPORT*  Clinical Data: Fall today and left side. Left-sided rib pain.  LEFT RIBS AND CHEST - 3+ VIEW  Comparison: Chest radiograph 12/14/2011  Findings: There are changes of median sternotomy, with five intact wires. Heart size appears grossly stable, but is partially obscured by bibasilar opacities. Bibasilar opacities and small bilateral pleural effusions are without significant change compared to the chest radiograph of 12/14/2011. No acute or healing left rib fracture is identified. A metallic skin marker denotes the site of patient pain along the lower lateral left hemithorax.  IMPRESSION:  1. No acute left rib fracture is identified. 2. Small bilateral pleural effusions and bibasilar opacities (atelectasis favored as the etiology) are without significant change compared to the chest radiograph of 12/14/2011. Suggest follow-up to resolution.   Original Report Authenticated By: Britta Mccreedy, M.D.       1. Chest wall contusion   2. Fall       MDM    66 yo M in no acute distress, afebrile, vital signs stable, non toxic appearing who presents with mechanical  fall with pain to L chest wall. Imaging negative for acute fx. Neuro exam non focal. Thorough discussion with patient he understands and agrees with plan will follow up with PCP. Patient has scheduled appointment for his MG. At time of discharge pain well controlled and patient is HDS.  Patient states he has pain oxycodone at home and does not need a new prescription.         Nadara Mustard, MD 03/11/12 [phone]

## 2012-03-11 NOTE — ED Notes (Signed)
Pt in via EMS, pt in s/p fall, pt was walking out of restaurant and noted some weakness in his legs, pt has a history of same due to chronic condition, pt c/o left rib pain, tender to palpation, pt alert and oriented, denies changes in breathing since falling. Pt denies hitting head or LOC.

## 2012-03-14 ENCOUNTER — Encounter: Payer: Self-pay | Admitting: Internal Medicine

## 2012-03-14 NOTE — Assessment & Plan Note (Signed)
He still has not started his Cellcept, stating "I want to take care of this diarrhea and stomach pain before I introduce another element into this that can cause diarrhea."  We discussed that getting started on the medication sooner rather then later will make it easier to see what the effect it has and, if it works, stop the progression of the disease before it gets too bad.  He is still hesitant and states that he will continue to wait.

## 2012-03-14 NOTE — Assessment & Plan Note (Signed)
Lab Results  Component Value Date   HGBA1C 14.0 10/30/2011   HGBA1C 13.9* 01/29/2011   HGBA1C 10.3 04/28/2010   Lab Results  Component Value Date   MICROALBUR 8.09* 10/29/2009   LDLCALC 99 11/04/2011   CREATININE 0.92 02/05/2012   His A1C is completely out of control and he refuses to take his medications for it because "I know my body and it doesn't function with a blood sugar less then 250-300."  He also states that it will get better when he stops the prednisone that he has been on for almost a year.  We discussed again today the fact that we need better control over his blood sugars or he will continue to waste away from the lost energy.  He states that he will consider it if he can get started on the Cellcept and off the prednisone.

## 2012-03-14 NOTE — Assessment & Plan Note (Signed)
His abdominal symptoms are improved with the protonix and stopping the Ibuprofen.  He has been using the oxycodone for the lower abdominal pain.  I feel like he likely has two different pathologies with some likely gastritis with the diarrhea and pain with eating that likely comes from his overuse of NSAIDs as well as the lower abdominal pain that may be his C. Diff that may or may not have been eliminated.  He has a follow up with Dr. Leone Payor in the first part of September and was encouraged to keep that.

## 2012-03-14 NOTE — Assessment & Plan Note (Signed)
He continues to have leg swelling and some DOE that is likely partially from his heart failure.  He is followed at [facility] for this and has an appointment with them next week and arrangements for a cath to better classify what is going on for him.  I encouraged him to continue taking his medications and to keep his appointment as directed.

## 2012-03-15 ENCOUNTER — Other Ambulatory Visit: Payer: Self-pay | Admitting: Internal Medicine

## 2012-03-17 ENCOUNTER — Ambulatory Visit (INDEPENDENT_AMBULATORY_CARE_PROVIDER_SITE_OTHER): Payer: Medicare Other | Admitting: Internal Medicine

## 2012-03-17 ENCOUNTER — Other Ambulatory Visit (INDEPENDENT_AMBULATORY_CARE_PROVIDER_SITE_OTHER): Payer: Medicare Other

## 2012-03-17 VITALS — BP 160/90 | HR 87 | Ht 67.0 in

## 2012-03-17 DIAGNOSIS — A0472 Enterocolitis due to Clostridium difficile, not specified as recurrent: Secondary | ICD-10-CM

## 2012-03-17 DIAGNOSIS — R634 Abnormal weight loss: Secondary | ICD-10-CM

## 2012-03-17 DIAGNOSIS — G7000 Myasthenia gravis without (acute) exacerbation: Secondary | ICD-10-CM

## 2012-03-17 DIAGNOSIS — Z79899 Other long term (current) drug therapy: Secondary | ICD-10-CM

## 2012-03-17 DIAGNOSIS — R112 Nausea with vomiting, unspecified: Secondary | ICD-10-CM

## 2012-03-17 DIAGNOSIS — R1010 Upper abdominal pain, unspecified: Secondary | ICD-10-CM

## 2012-03-17 DIAGNOSIS — R109 Unspecified abdominal pain: Secondary | ICD-10-CM

## 2012-03-17 LAB — COMPREHENSIVE METABOLIC PANEL
AST: 17 U/L (ref 0–37)
Albumin: 2.1 g/dL — ABNORMAL LOW (ref 3.5–5.2)
Alkaline Phosphatase: 91 U/L (ref 39–117)
Potassium: 2.4 mEq/L — CL (ref 3.5–5.1)
Sodium: 135 mEq/L (ref 135–145)
Total Bilirubin: 1 mg/dL (ref 0.3–1.2)
Total Protein: 5 g/dL — ABNORMAL LOW (ref 6.0–8.3)

## 2012-03-17 LAB — LIPASE: Lipase: 26 U/L (ref 11.0–59.0)

## 2012-03-17 LAB — HEMOGLOBIN A1C: Hgb A1c MFr Bld: 9.9 % — ABNORMAL HIGH (ref 4.6–6.5)

## 2012-03-17 LAB — CBC WITH DIFFERENTIAL/PLATELET
Basophils Absolute: 0 10*3/uL (ref 0.0–0.1)
Eosinophils Absolute: 0.1 10*3/uL (ref 0.0–0.7)
Hemoglobin: 14.6 g/dL (ref 13.0–17.0)
Lymphocytes Relative: 14.7 % (ref 12.0–46.0)
MCHC: 32.5 g/dL (ref 30.0–36.0)
Monocytes Relative: 9.6 % (ref 3.0–12.0)
Neutrophils Relative %: 74 % (ref 43.0–77.0)
Platelets: 248 10*3/uL (ref 150.0–400.0)
RDW: 14.5 % (ref 11.5–14.6)

## 2012-03-17 LAB — AMYLASE: Amylase: 52 U/L (ref 27–131)

## 2012-03-17 NOTE — Progress Notes (Unsigned)
Patient ID: [name], male   DOB: 1946/05/18, 66 y.o.   MRN: [id number] Lab called up at 5:15pm with a critical lab result.  Potassium 2.4. Dr. Leone Payor was notified stat and will advise pt. According.

## 2012-03-17 NOTE — Progress Notes (Signed)
Quick Note:  Unable to reach patient - message left to be sure he is taking K supps We will call him again tomorrow - re: is he taking them  If he is will need to add an extra dose as nausea and vomiting may have contributed to loss.  blood sugar under better control but still poor ______

## 2012-03-17 NOTE — Patient Instructions (Addendum)
Your physician has requested that you go to the basement for the following lab work before leaving today: CBC/Diff, CMET, Hgb. AIC, Amylase, Lipase  We will call you with results and plans.  Thank you for choosing me and [facility].  Iva Boop, M.D., [facility]

## 2012-03-17 NOTE — Progress Notes (Signed)
  Subjective:    Patient ID: [name], male    DOB: 1945-08-03, 66 y.o.   MRN: [id number]  HPI Patient returns for followup after he was evaluated and treated for pseudomembranous colitis. I prescribed vancomycin and his diarrhea resolved. Now for the past 2-3 weeks he has had nausea and vomiting and upper abdominal pain. In the past 48 hours that has subsided. He says he was able he does state recently. He did not have fever with this. It started fairly suddenly and without new medication or other change in his situation. He has still not started his CellCept for myasthenia gravis and feels terribly weak. He says he is too weak to stand for his weight though he is convinced he is still losing weight. He says he is 175 pounds. He has had high blood sugars on previous testing from chart review today. He says he cannot tolerate having a blood sugar lower than 200 or he will get weak and dizzy and feel like he is going to pass out. He says his blood sugars tend to run around 300. He reports that he was recently in the cardiology clinic at [facility] and is due to go back there in November.  Medications, allergies, past medical history, past surgical history, family history and social history are reviewed and updated in the EMR.  Review of Systems As per history of present illness.    Objective:   Physical Exam General:  NAD somewhat asthenic in a wheelchair Eyes:   anicteric Lungs:  clear Heart:  S1S2 no rubs, murmurs or gallops Abdomen:  soft and nontender, BS+ Ext:   2+ edema           Assessment & Plan:   1. Nausea and vomiting  CBC with Differential, Amylase, Lipase, Comp Met (CMET), HgB A1c  2. Upper abdominal pain  CBC with Differential, Amylase, Lipase, Comp Met (CMET), HgB A1c  3. Weight loss  CBC with Differential, Amylase, Lipase, Comp Met (CMET), HgB A1c  4. Pseudomembranous colitis  CBC with Differential, Amylase, Lipase, Comp Met (CMET), HgB A1c  5. Myasthenia gravis   CBC with Differential, Amylase, Lipase, Comp Met (CMET), HgB A1c   1. Await lab tests as ordered. ? If recent problems a gastroenteritis or gastroparesis. Hyperglycemia likely contributing to this and his weight loss. 2. If persistent nausea and vomiting Upper GI vs. EGD. I have concerns about sedation and his myasthenia problems. i exp;ained this to him today - as he is weak and resistant to taking his cellcept. Says he will start it. 3. At this point his diarrhea is resolved and given co-morbidities would hold off on a colonoscopy.  WU:[id number], MD

## 2012-03-18 NOTE — Progress Notes (Signed)
Quick Note:  Potassium is low  I left him a message  Please find out is he taking K??  Is he vomiting again?   ______

## 2012-03-18 NOTE — Progress Notes (Signed)
Quick Note:  He should have a potassium recheck next week through PCP ______

## 2012-03-22 ENCOUNTER — Telehealth: Payer: Self-pay | Admitting: *Deleted

## 2012-03-22 DIAGNOSIS — E876 Hypokalemia: Secondary | ICD-10-CM | POA: Insufficient documentation

## 2012-03-22 NOTE — Telephone Encounter (Signed)
Sure I'll put a Bmet in for future so call him to have him come in to get it drawn and then we can go from there.  Thanks!

## 2012-03-22 NOTE — Telephone Encounter (Signed)
Pt called stating he wants blood work drawn for low potassium.  Pt states it was low last week at Dr Marvell Fuller office. Last documentation shows K was 2.4 on 10/10 - treated by Dr Leone Payor.  Will you place an order for labs? I called pt to be sure he was treated and no answer.  Message was left. Pt # [id number]

## 2012-03-23 NOTE — Telephone Encounter (Signed)
Pt called about sch lab - discuss times for today and 03/24/12 9AM-11:30AM or 1:30PM-4:30PM Stanton Kidney Shaelynn Dragos RN 03/23/12 11AM

## 2012-03-25 ENCOUNTER — Other Ambulatory Visit: Payer: Self-pay | Admitting: Internal Medicine

## 2012-03-25 ENCOUNTER — Other Ambulatory Visit: Payer: Self-pay | Admitting: *Deleted

## 2012-03-25 MED ORDER — OXYCODONE HCL 5 MG PO TABS: 5.0000 mg | ORAL_TABLET | Freq: Three times a day (TID) | ORAL | Status: DC | PRN

## 2012-03-25 NOTE — Telephone Encounter (Addendum)
Call pt when ready @ [phone] Please change to # [phone]

## 2012-03-29 ENCOUNTER — Other Ambulatory Visit: Payer: Self-pay | Admitting: Internal Medicine

## 2012-03-29 ENCOUNTER — Telehealth: Payer: Self-pay | Admitting: *Deleted

## 2012-03-29 ENCOUNTER — Other Ambulatory Visit (INDEPENDENT_AMBULATORY_CARE_PROVIDER_SITE_OTHER): Payer: Medicare Other

## 2012-03-29 ENCOUNTER — Encounter: Payer: Self-pay | Admitting: *Deleted

## 2012-03-29 ENCOUNTER — Ambulatory Visit (INDEPENDENT_AMBULATORY_CARE_PROVIDER_SITE_OTHER): Payer: Medicare Other | Admitting: Internal Medicine

## 2012-03-29 DIAGNOSIS — E876 Hypokalemia: Secondary | ICD-10-CM

## 2012-03-29 DIAGNOSIS — L918 Other hypertrophic disorders of the skin: Secondary | ICD-10-CM

## 2012-03-29 DIAGNOSIS — L909 Atrophic disorder of skin, unspecified: Secondary | ICD-10-CM

## 2012-03-29 LAB — BASIC METABOLIC PANEL
BUN: 12 mg/dL (ref 6–23)
Chloride: 99 mEq/L (ref 96–112)
Creat: 0.76 mg/dL (ref 0.50–1.35)
Glucose, Bld: 304 mg/dL — ABNORMAL HIGH (ref 70–99)

## 2012-03-29 MED ORDER — POTASSIUM CHLORIDE CRYS ER 20 MEQ PO TBCR: EXTENDED_RELEASE_TABLET | ORAL | Status: DC

## 2012-03-29 NOTE — Progress Notes (Unsigned)
Pt presents c/o L underarm skin tags 2 of which are red and irritated, he  States he can go home and use the nail clippers on it, this is discouraged greatly. i spoke w/ [place]., she states that dr schooler has a cancelled slot at 0945 but since pt had a lab appt scheduled this am she will put him in the 1030 slot to be seen at 0945. i spoke w/ dr Josem Kaufmann and he was agreeable, i then spoke w/ dr schooler and she was not stating she was behind already, pt will be seen by dr Josem Kaufmann and possibly medical student  1000 Consent signed by dr Josem Kaufmann and myself, timeout done by dr Josem Kaufmann and myself, L underarm, 3 skin tags to be removed.

## 2012-03-29 NOTE — Progress Notes (Signed)
Patient ID: [name], male   DOB: [birth year], 66 y.o.   MRN: [id number]  [name] presents today complaining of irritated skin tags under his left arm.  There have been no fevers or drainage although two of the three lesions have a red base.  He wishes to have them removed.  Exam: WDWN man sitting comfortably in a wheelchair in NAD.  Five axillary skin tags in the left axilla, three of which are inflamed at the base and 2 of whom have "dry gangrene" on the tips.  Indications: Irritated left axillary skin tags X 3.  Consent: [name] was informed of the risks and benefits of the skin tag removal and the alternative of doing nothing.  He agreed to proceeding with the procedure.  A time out was performed to assure it was the right patient, getting the right procedure, in the right location.  Prep: Betadine was applied to the base of each skin tag.  1% lidocaine was injected into the base of each skin tag using a 27 gauge needle.  Each skin tag was removed at the base using a dermablade.  Coagulation was obtained using a phos stick.  The skin tags were not submitted to path as there was no question in the diagnosis.  The patient tolerated the procedure well.  He was asked to call for an appointment if he developed worsening redness, drainage from the lesions, or fevers.  Otherwise, he can follow-up with his PCP as already scheduled.

## 2012-03-29 NOTE — Patient Instructions (Signed)
Call the clinic should there be any pain, drainage, or fevers that develop in the next several days.  You can keep the bandaide on the area until it falls off. There is no restrictions on showering.

## 2012-03-29 NOTE — Telephone Encounter (Signed)
Spoke with pharmacy and gave okay to fill on 03/30/12.  Will need to discuss usage pattern with [name] to ensure that he is not taking more then prescribed.

## 2012-03-29 NOTE — Assessment & Plan Note (Addendum)
Patient called to obtain lab results. It was noted that patient's potassium today was 3.1. Patient was called by Dr Josem Kaufmann who left a message. I informed him about the changes in dosage: He will be taking 40 meq TID for 2 days per Dr Charlesetta Shanks recommendation. Will discuss with Dr Josem Kaufmann about further management including repeat Bmet and dosage

## 2012-03-29 NOTE — Telephone Encounter (Signed)
Call from [facility] - pt has Oxycodone rx  (Oxy IR), wants to know if he can get it filled 3 days early? Thanks

## 2012-03-29 NOTE — Progress Notes (Signed)
Arloa Koh spoke with [name] and relayed the message to him about going to 40 mEq TID for 2 days.  She told him that the clinic would call him for a follow up appointment to recheck his potassium.  Thanks!

## 2012-03-29 NOTE — Progress Notes (Signed)
Tried to call patient to inform him about the hypokalemia but only received an unidentified voice mail.  A message was not left.  I plan on increasing the KCl to 40 mEQ PO TID for 2 days.  Will ask Dr. Tonny Branch to try calling this evening.  If no success I will try calling tomorrow.

## 2012-03-30 ENCOUNTER — Telehealth: Payer: Self-pay | Admitting: *Deleted

## 2012-03-30 NOTE — Telephone Encounter (Signed)
We will still need to keep a close eye on his usage. He has a history of doing his own thing with his medications.

## 2012-03-30 NOTE — Telephone Encounter (Signed)
Call from pt. Pt calls to inform Dr Tonny Branch he had not been over using his medication b/c he had been throwing up (statest Dr Montez Hageman what this means).  States he had moles removed and had to take 3-4 pills. Also to thank Dr Tonny Branch for allowing early refill on Oxy IR.

## 2012-03-30 NOTE — Telephone Encounter (Signed)
Rx given to pt. 

## 2012-04-04 ENCOUNTER — Telehealth: Payer: Self-pay | Admitting: *Deleted

## 2012-04-04 NOTE — Telephone Encounter (Signed)
Problems with vomiting and dizziness this AM x 1 after taking Protonix and Metalozone. Called pt 12N - feeling some better. Slept about 2 hours. Suggest taking Protonix first thing in AM.  If problem returns to call [facility] or suggest ER. Stanton Kidney Natthew Marlatt RN 04/04/12 12N.

## 2012-04-11 ENCOUNTER — Ambulatory Visit (INDEPENDENT_AMBULATORY_CARE_PROVIDER_SITE_OTHER): Payer: Medicare Other | Admitting: Internal Medicine

## 2012-04-11 ENCOUNTER — Encounter: Payer: Self-pay | Admitting: Internal Medicine

## 2012-04-11 ENCOUNTER — Ambulatory Visit (INDEPENDENT_AMBULATORY_CARE_PROVIDER_SITE_OTHER): Payer: Medicare Other | Admitting: Dietician

## 2012-04-11 ENCOUNTER — Telehealth: Payer: Self-pay | Admitting: Dietician

## 2012-04-11 ENCOUNTER — Ambulatory Visit: Payer: Medicare Other | Admitting: Dietician

## 2012-04-11 ENCOUNTER — Ambulatory Visit: Payer: Medicare Other | Admitting: Internal Medicine

## 2012-04-11 VITALS — BP 150/98 | HR 112 | Ht 67.0 in | Wt 169.0 lb

## 2012-04-11 VITALS — BP 151/81 | HR 82 | Temp 98.5°F | Ht 67.0 in | Wt 175.1 lb

## 2012-04-11 DIAGNOSIS — E1165 Type 2 diabetes mellitus with hyperglycemia: Secondary | ICD-10-CM

## 2012-04-11 DIAGNOSIS — R1010 Upper abdominal pain, unspecified: Secondary | ICD-10-CM

## 2012-04-11 DIAGNOSIS — R634 Abnormal weight loss: Secondary | ICD-10-CM

## 2012-04-11 DIAGNOSIS — R109 Unspecified abdominal pain: Secondary | ICD-10-CM

## 2012-04-11 DIAGNOSIS — IMO0001 Reserved for inherently not codable concepts without codable children: Secondary | ICD-10-CM

## 2012-04-11 LAB — GLUCOSE, CAPILLARY: Glucose-Capillary: 221 mg/dL — ABNORMAL HIGH (ref 70–99)

## 2012-04-11 MED ORDER — INSULIN GLARGINE 100 UNIT/ML ~~LOC~~ SOLN: 15.0000 [IU] | Freq: Every day | SUBCUTANEOUS | Status: DC

## 2012-04-11 NOTE — Progress Notes (Signed)
  Subjective:    Patient ID: [name], male    DOB: [birth year], 66 y.o.   MRN: [id number]  HPI The patient presents with persistent complaints of upper abdominal pain and vomiting as well as weight loss. He was started on insulin today.  He continues to have problems or he develops intermittent upper abdominal pain bilateral and central, followed by vomiting. He is moving his bowels most days but some days has to sit in a warm tub to relax his abdomen so he can defecate he says. He has a chronic lower abdominal pain as well.  He has not yet started CellCept.  Medications, allergies, past medical history, past surgical history, family history and social history are reviewed and updated in the EMR.  Review of Systems He remains a some lower family weakness, he struck his right chest wall and a total yesterday and believes he might have bruised her fractured some ribs. He has chronic peripheral edema.    Objective:   Physical Exam General:  NAD Eyes:   anicteric Lungs:  clear Heart:  S1S2 no rubs, murmurs or gallops Abdomen:  It is soft, he is tender in the upper abdomen, he guards rather aggressively with palpation. There is no organomegaly or mass. There is no succussion splash. Ext:   2+ lower extremity edema   Data Reviewed:  Wt Readings from Last 3 Encounters:  04/11/12 169 lb (76.658 kg)  04/11/12 175 lb 1.6 oz (79.425 kg)  02/10/12 188 lb (85.276 kg)      Assessment & Plan:   1. Upper abdominal pain   2. Weight loss    1. Continue think that diabetes is not controlled is a large part of these problems. 2. However, is not entirely clear. I'm going to start with a CT abdomen and pelvis. 3. Pending upon those results an upper GI endoscopy could be needed. 4. His underlying myasthenia gravis present some sedation issues, would need anesthesia assistance at the hospital to perform any endoscopic procedures.  CC: PRIBULA,CHRISTOPHER, MD

## 2012-04-11 NOTE — Assessment & Plan Note (Signed)
He has not been on oral hypoglycemic for several months.  It is possible that after blood glucose is controlled with insulin, oral therapies may offer some benefit but he is likely to need life-long insulin at this point.  We started treatment today with insulin glargine 15 units qHS.  He completed teaching with Norm Parcel and is competent with the Lantus pen.  He is scheduled to see his PCP late next week. - start insulin glargine 15 units qHS

## 2012-04-11 NOTE — Patient Instructions (Addendum)
As soon as we hear back from Dr. Glennon Hamilton office at [facility] we will proceed with setting up a CT scan of your abdomen and pelvis.  Thank you for choosing me and [facility].  Iva Boop, M.D., [facility]

## 2012-04-11 NOTE — Telephone Encounter (Signed)
Patient called to ask about referral to an endocrinologist and he is also wanting to start insulin. He says Dr. Dimas Aguas at [facility] convinced him he needs both of these to stop his weight loss.   Patient scheduled for this afternoon with doctor and CDE. He was notified of the same and informed CDE that his fasting blood sugar today is 163 mg/dl the "lowest he has seen ain a while".   Discussed with physician who will being seeing [name] and triage nurse.

## 2012-04-11 NOTE — Progress Notes (Signed)
Diabetes Self-Management Training (DSMT)  Follow-Up 1 Visit  04/11/2012 Mr. Jonmarc Bodkin, identified by name and date of birth, is a 66 y.o. male with Type 2 Diabetes. Year of diabetes diagnosis: before 2007  ASSESSMENT Patient concerns are Medication and Glycemic control.  There were no vitals taken for this visit. There is no height or weight on file to calculate BMI. Lab Results  Component Value Date   LDLCALC 99 11/04/2011   Lab Results  Component Value Date   HGBA1C 9.9* 03/17/2012   Family history of diabetes: No Support systems: 65 year old mother who is little support to patient Special needs: Simplified materials, Verbal instruction Prior DM EdPatients belief/attitude about diabetes: Difficult to determine today- patient historically did not want to take medicine for his diabetes Self foot exams daily: No Diabetes Complications: Neuropathy, retinopathy and Gastroparesis   Medications See Medications list.  Not taking as prescribed and Is interested in learning more   Exercise Plan Doing ADLs for 30-60 minutesa day.   Self-Monitoring  Monitor: One touch which was not brought in today Frequency of testing: 1-2 times/day Breakfast: 162-190 Rest of dat: unsure- patient re[ports usually 200-300s Hyperglycemia: Yes Daily Hypoglycemia: No   Meal Planning Limited knowledge   Assessment comments: patient reports weight loss- note decreased 40 # since 2008, 38# of that in past year, appetite mdecreased, wants referral to endocrinology and to start insulin right away top help control his weight loss.  His goal blood sugar is 150-180. Patient had been instructed on insulin injection in past and injected himself today with saline and demonstrated ability ot use insulin pen.      INDIVIDUAL DIABETES EDUCATION PLAN:  Medication _______________________________________________________________________  Intervention TOPICS COVERED TODAY:  Medication  Taught/evaluated insulin  injection, site rotation, insulin storage and needle disposal. Reviewed patients medication for diabetes, action, purpose, timing of dose and side effects.  PATIENTS GOALS/PLAN (copy and paste in patient instructions so patient receives a copy): 1.  Learning Objective:        2.  Behavioral Objective:         Medications: To improve blood glucose levels, I will take my medication as prescribed Never 0% Monitoring: To identify blood glucose trends, I will bring my meter to office visits Never 0%  Personalized Follow-Up Plan for Ongoing Self Management Support:  Doctor's Office, family and CDE visits ______________________________________________________________________   Outcomes Expected outcomes: Demonstrated interest in learning.Expect positive changes in lifestyle. Self-care Barriers: Coping skills, Health care beliefs Education material provided: yes- insulin starter kit and pen instructions Patient to contact team via Phone if problems or questions. Time in: 1400     Time out: 1430  Future DSMT - 2 wks   Plyler, Lupita Leash

## 2012-04-11 NOTE — Patient Instructions (Signed)
Start taking 15 units of Lantus at bedtime.  You will follow up with Dr. Tonny Branch on the 15th.

## 2012-04-11 NOTE — Progress Notes (Signed)
  Subjective:    Patient ID: [name], male    DOB: 03-09-1946, 66 y.o.   MRN: [id number]  HPI:  This is a 66 year old man with many complaints and medical problems including type II diabetes and irritable bowel.  He presents wishing to finally start insulin therapy.  He is currently not on any treatment for diabetes, stating that his body is use to night blood sugars and it cannot operate below 200, and that oral hypoglycemics did not sit well with him.  He was finally convinced, recently by one of his physicians at [facility], to begin insulin.  At home, his blood glucose is consistently in the 200s and 300s.  Incidentally, his blood sugar was 164 this morning which is the lowest he has seen in years.  He does reports some episodes where he feels palpitations associated with dizziness and feeling flushed, but these are not associated with objective episodes of hypoglycemia.    Review of Systems  All other systems reviewed and are negative.       Objective:   Physical Exam  GENERAL: overweight; no acute distress LUNGS: clear to auscultation bilaterally, normal work of breathing HEART: normal rate but an irregular rhythm; normal S1 and S2 without S3 or S4; no murmurs, rubs, or clicks ABDOMEN: soft, diffusely tender, normal bowel sounds    Filed Vitals:   04/11/12 1430  BP: 151/81  Pulse: 82  Temp: 98.5 F (36.9 C)     CBG (last 3)   Basename 04/11/12 1456  GLUCAP 221*     Hemoglobin A1c:  Ref. Range 10/29/2009 14:26 04/28/2010 14:02 01/29/2011 08:27 10/30/2011 15:30 03/17/2012 16:55  Hemoglobin A1C 4.6-6.5 % 10.4 10.3 13.9 (H) 14.0 9.9 (H)         Assessment & Plan:

## 2012-04-13 ENCOUNTER — Telehealth: Payer: Self-pay | Admitting: Dietician

## 2012-04-13 ENCOUNTER — Telehealth: Payer: Self-pay | Admitting: *Deleted

## 2012-04-13 NOTE — Telephone Encounter (Signed)
Pt needs referral to [facility] medical, dr balan or dr Juleen China for diabetes. Please send this to your nurse

## 2012-04-13 NOTE — Telephone Encounter (Signed)
Patient left voicemail that he has not started lantus yet because his blood sugars were in the 160s9 "so low". However, he plans to pick it up today because his blood sugar today is "high" in the 340s. he wants to know if he can take the insulin in the day instead of at night.  He was also asking about a referral to an endocrinologist  Left message that lantus can be taken any time of day that he can consistently take it at that same time on a daily basis and that Dr. Tonny Branch will be back on 04/17/12.

## 2012-04-14 ENCOUNTER — Telehealth: Payer: Self-pay | Admitting: Internal Medicine

## 2012-04-14 DIAGNOSIS — R1010 Upper abdominal pain, unspecified: Secondary | ICD-10-CM

## 2012-04-14 DIAGNOSIS — R634 Abnormal weight loss: Secondary | ICD-10-CM

## 2012-04-14 NOTE — Telephone Encounter (Signed)
Spoke with Tiffany in scheduling and patient informed of CT appointment :  04/18/12 at 11:30am, arrive 11:15 am at [facility] radiology on 1st floor.  Patient informed of the details and told to be NPO 4 hours except for oral contrast 1st bottle at 9:30am, 2nd bottle at 10:30am.  Patient was informed that Dr. Leone Payor and Dr. Clarisa Kindred from [facility] spoke regarding patients Myasthenia Gravis condition and it was cleared for him to do the CT.  Dr. Jeannette How # is [phone], Liborio Nixon is his assistant.

## 2012-04-14 NOTE — Telephone Encounter (Signed)
Spoke to Dr. Dimas Aguas about contrast for CT and possible myasthenia flare.  He said it is very rare overall and was with older contrast agents.  Suggest do CT scan at hospital, IV contrast ok

## 2012-04-14 NOTE — Progress Notes (Signed)
I saw, examined, and discussed the patient with Dr Wallace and agree with the note contained here.  

## 2012-04-18 ENCOUNTER — Ambulatory Visit (HOSPITAL_COMMUNITY)
Admission: RE | Admit: 2012-04-18 | Discharge: 2012-04-18 | Disposition: A | Discharge status: Home / Self Care | Payer: Medicare Other | Source: Ambulatory Visit | Attending: Internal Medicine | Admitting: Internal Medicine

## 2012-04-18 ENCOUNTER — Telehealth: Payer: Self-pay | Admitting: Dietician

## 2012-04-18 DIAGNOSIS — R112 Nausea with vomiting, unspecified: Secondary | ICD-10-CM | POA: Insufficient documentation

## 2012-04-18 DIAGNOSIS — R1010 Upper abdominal pain, unspecified: Secondary | ICD-10-CM

## 2012-04-18 DIAGNOSIS — R1032 Left lower quadrant pain: Secondary | ICD-10-CM | POA: Insufficient documentation

## 2012-04-18 DIAGNOSIS — R634 Abnormal weight loss: Secondary | ICD-10-CM | POA: Insufficient documentation

## 2012-04-18 MED ORDER — IOHEXOL 300 MG/ML  SOLN: 80.0000 mL | Freq: Once | INTRAMUSCULAR | Status: AC | PRN

## 2012-04-18 NOTE — Progress Notes (Signed)
Quick Note:  Most of his symptoms are likely from his poorly controlled diabetes I expect that they will improve as his sugars improve  I do think an endoscopy is reasonable, however  Epigastric pain, early satiety and weight loss  Needs to be done with anesthesia at hospital including PAT testing since he has myasthenia gravis Would schedule during my hospital week ______

## 2012-04-18 NOTE — Progress Notes (Signed)
Quick Note:  The CT scan did not show any significant problems. Please find out what his GI symptoms are currently. ______

## 2012-04-19 NOTE — Telephone Encounter (Signed)
Left a second message returning patient call form last week

## 2012-04-20 ENCOUNTER — Other Ambulatory Visit: Payer: Self-pay

## 2012-04-20 DIAGNOSIS — R634 Abnormal weight loss: Secondary | ICD-10-CM

## 2012-04-20 DIAGNOSIS — R6881 Early satiety: Secondary | ICD-10-CM

## 2012-04-20 DIAGNOSIS — R1013 Epigastric pain: Secondary | ICD-10-CM

## 2012-04-22 ENCOUNTER — Telehealth: Payer: Self-pay | Admitting: *Deleted

## 2012-04-22 ENCOUNTER — Ambulatory Visit (INDEPENDENT_AMBULATORY_CARE_PROVIDER_SITE_OTHER): Payer: Medicare Other | Admitting: Internal Medicine

## 2012-04-22 ENCOUNTER — Encounter: Payer: Self-pay | Admitting: Internal Medicine

## 2012-04-22 VITALS — BP 158/100 | HR 91 | Temp 96.6°F | Ht 67.0 in | Wt 184.7 lb

## 2012-04-22 DIAGNOSIS — E1165 Type 2 diabetes mellitus with hyperglycemia: Secondary | ICD-10-CM

## 2012-04-22 DIAGNOSIS — I5022 Chronic systolic (congestive) heart failure: Secondary | ICD-10-CM

## 2012-04-22 DIAGNOSIS — I10 Essential (primary) hypertension: Secondary | ICD-10-CM

## 2012-04-22 DIAGNOSIS — R112 Nausea with vomiting, unspecified: Secondary | ICD-10-CM | POA: Insufficient documentation

## 2012-04-22 DIAGNOSIS — R609 Edema, unspecified: Secondary | ICD-10-CM

## 2012-04-22 DIAGNOSIS — G7000 Myasthenia gravis without (acute) exacerbation: Secondary | ICD-10-CM

## 2012-04-22 LAB — BASIC METABOLIC PANEL
BUN: 14 mg/dL (ref 6–23)
CO2: 32 mEq/L (ref 19–32)
Calcium: 8.9 mg/dL (ref 8.4–10.5)
Creat: 0.92 mg/dL (ref 0.50–1.35)

## 2012-04-22 MED ORDER — ONDANSETRON 4 MG PO TBDP: 4.0000 mg | ORAL_TABLET | Freq: Three times a day (TID) | ORAL | Status: DC | PRN

## 2012-04-22 NOTE — Telephone Encounter (Signed)
Agree with Er eval. Thanks

## 2012-04-22 NOTE — Telephone Encounter (Signed)
Call from pt.  States he's "having trouble breathing and having some dizziness".  Also feet are swollen. States he took "another potassium pill" today. He has 2:45Pm appt w/Dr Tonny Branch today. Instructed pt to go to the ED.  But he said he will "take another Mestinon pill" now and wait 30 min to 1 hour; if he's not Feeling any better, he will go to the ED. Informed pt not to wait too long before going to the ED. He agreed and wants Dr Tonny Branch to made awared.

## 2012-04-22 NOTE — Progress Notes (Signed)
Subjective:   Patient ID: [name] male   DOB: May 03, 1946 66 y.o.   MRN: [id number]  HPI: Mr.Ellen Reece Agar Timoney is a 66 y.o. man presents today for follow up of his chronic medical conditions including myasthenia gravis, Systolic heart failure, uncontrolled diabetes mellitus, and abdominal pain.   He states that he over the last day he has had increasing shortness of breath with increased swelling in his legs.  He states that last night he could not breath lying flat at night.  He is more dyspneic on exertion then prior as well.  He denies chest pain, increased leg weakness, productive cough, post nasal drip, or sore throat.  He has only been taking his lasix once daily over the last week or so because he noticed that his weight had dropped to 176 on his home scale and thought he was getting too dry.  He has some baseline "dizziness" on standing but he states it is more an unsteadiness rather then a lightheadedness.    He states that he is continuing to have problem with the stomach pain, nausea, and vomiting.  He states that he has been "chugging" water so when he vomits he won't throw up bile.  He has not been able to eat much over the last 3 months and states that when he does eat he eats very little.  He has not been taking any medication to help with the nausea.  He has been following with Dr. Leone Payor and they are planning on doing a EGD on 05/13/12.  He denies diarrhea or constipation.  His pain is constant, band like pain in his lower abdomen.  The pain has improved mildly with the protonix and oxycodone.  He has noted that he has vomited up several of the oxycodone over the last few weeks.    He still has not started the Cellcept for his myasthenia.  He has been taking the Mestinon and has followed with Dr. Dimas Aguas who continues to suggest that he start the Cellcept.  He continues to be worried about the GI side effects compounded with his current GI distress.  He states that he feels like he  continues to get weaker and has been contemplating a wheelchair to help him get around.    He still has not started his Lantus.  He is concerned because his blood sugars have been around 200 for the last few days when he checks them.  He continues to state that his body does not function with a blood sugar lower then 200.  On the insistence of Dr. Dimas Aguas and Dr. Daiva Huge, his doctors at [facility] he did call and ask to get started on insulin therapy.  He was told to start 15 units of lantus daily and follow up for titration.  He is concerned that this will drop his blood sugars too low.  He continues to have polyuria and polydipsia daily and he continues to lose weight.   Past Medical History  Diagnosis Date  . Sinus tachycardia 2006    a. Nonobstructive CAD by cath 2013. b. s/p atrial tachycardia ablation 08/2011 (sinus node modification)  . Orthostatic hypotension   . Unspecified essential hypertension   . Hyperlipidemia   . 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 Cervical spondylosis with radiculopathy   . Syrinx     T5-T6  . Cord compression     Cord compression syndrome C5-C6, C6-C7  . ADD (attention  deficit disorder)   . Decreased libido   . Depression   . DM (diabetes mellitus)     adult onset  . Sleep apnea     occassionally uses cpap  . Right bundle branch block and left posterior fascicular block   . Pulmonary nodule, left     Evaluated 04/2011 by pulmonology - felt to be benign granuloma  . Peripheral edema   . 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 LV dysfunction     By echo 2013 at [facility] - reported EF 45% per pt  . Colitis     with surface exudate  . Hemorrhoids     small  . Pseudomembranous colitis   . Gastritis   . Clostridium difficile infection 02/15/2012   Current Outpatient Prescriptions  Medication Sig Dispense Refill  . benazepril (LOTENSIN) 40 MG tablet Take 1 tablet (40 mg total) by mouth  daily. For blood pressure  30 tablet  6  . FREESTYLE LITE test strip USE TO TEST BLOOD SUGAR UP TO 3 TIMES A DAY FOR THE NEXT WEEK, THENDECREASE TO ONCE A DAY  50 each  9  . furosemide (LASIX) 40 MG tablet Take 120 mg by mouth 2 (two) times daily.      Marland Kitchen glucose monitoring kit (FREESTYLE) monitoring kit       . insulin glargine (LANTUS) 100 UNIT/ML injection Inject 15 Units into the skin at bedtime.  1 pen  12  . metolazone (ZAROXOLYN) 5 MG tablet Take 1 tablet by mouth 2 (two) times a week. Tuesday & Saturday      . mycophenolate (CELLCEPT) 500 MG tablet Take 1,000 mg by mouth 2 (two) times daily.      Marland Kitchen oxyCODONE (OXY IR/ROXICODONE) 5 MG immediate release tablet Take 1 tablet (5 mg total) by mouth every 8 (eight) hours as needed.  90 tablet  0  . pantoprazole (PROTONIX) 40 MG tablet Take 40 mg by mouth daily.      . potassium chloride SA (K-DUR,KLOR-CON) 20 MEQ tablet Take 2 tablets (40 mEq) by mouth 3(Three) times daily  120 tablet  3  . predniSONE (DELTASONE) 5 MG tablet Take 15 mg by mouth daily.      Marland Kitchen pyridostigmine (MESTINON) 60 MG tablet Take 1 tablet (60 mg total) by mouth 5 (five) times daily.      Marland Kitchen zolpidem (AMBIEN) 10 MG tablet Take 1 tablet (10 mg total) by mouth at bedtime.  30 tablet  5  . [DISCONTINUED] pantoprazole (PROTONIX) 40 MG tablet Take 1 tablet (40 mg total) by mouth daily.  30 tablet  2   Family History  Problem Relation Age of Onset  . Breast cancer Mother   . Other Father     Beck's Sarcoid  . Colon cancer Neg Hx   . Prostate cancer Neg Hx    History   Social History  . Marital Status: Single    Spouse Name: N/A    Number of Children: 1  . Years of Education: N/A   Occupational History  . Retired     Surveyor, minerals   Social History Main Topics  . Smoking status: Former Smoker -- 2.5 packs/day for 30 years    Quit date: 06/09/1991  . Smokeless tobacco: Never Used  . Alcohol Use: No  . Drug Use: No  . Sexually Active: None   Other Topics Concern  .  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  Objective:  Physical Exam: Filed Vitals:   04/22/12 1531  BP: 158/100  Pulse: 91  Temp: 96.6 F (35.9 C)  TempSrc: Oral  Height: 5\' 7"  (1.702 m)  Weight: 184 lb 11.2 oz (83.779 kg)  SpO2: 96%   Constitutional: Vital signs reviewed.  Patient is a chronically ill appearing man who appears older then his states age.  He is in no acute distress and cooperative with exam. Alert and oriented x3.  Head: Normocephalic and atraumatic Ear: TM normal bilaterally Mouth: no erythema or exudates, MMM Eyes: PERRL, EOMI, conjunctivae normal, No scleral icterus.  Neck: Supple, Trachea midline normal ROM, No JVD, mass, thyromegaly, or carotid bruit present.  Cardiovascular: RRR with occasional ectopic beats. S1 normal, S2 normal, no MRG, pulses symmetric and intact bilaterally Pulmonary/Chest: CTAB, no wheezes, rales, or rhonchi Abdominal: Soft. Mild bilateral lower quadrant tenderness to palpation, non-distended, bowel sounds are normal, no masses, organomegaly, or guarding present.  GU: no CVA tenderness Musculoskeletal: No joint deformities, erythema, or stiffness, ROM full and no nontender Hematology: no cervical, inginal, or axillary adenopathy.  Neurological: A&O x3, Strength is diminished 4/5 bilateral in the upper and lower extremities. cranial nerve II-XII are grossly intact, no focal motor deficit, sensory intact to light touch bilaterally.  Skin: 3+ pitting edema to the knee bilaterally, 2+ pitting edema in the thighs. Warm, dry and intact. No rash, cyanosis, or clubbing.  Psychiatric: Normal mood and affect. speech and behavior is normal. Judgment and thought content normal. Cognition and memory are normal.   Assessment & Plan:

## 2012-04-22 NOTE — Patient Instructions (Signed)
1.  Increase your Lasix to twice daily over the weekend.  - Continue the Metolazone daily  - Watch out for increased dizziness on standing.  If you have problems call the clinic.  - If the SOB and swelling gets worse you need to come to the ED sooner rather then later.  - Follow up early next week.  2.  Start the Zofran tablets every 8 hours for the nausea.  3.  Continue your other medications as prescribed.  4.  Make sure you start the Lantus 10 units every evening.

## 2012-04-22 NOTE — Telephone Encounter (Signed)
Thanks for the note.  I agree that if he is still having problems ED evaluation seems most appropriate.  Otherwise we will anticipate him coming to his appointment.

## 2012-04-23 NOTE — Assessment & Plan Note (Signed)
Lab Results  Component Value Date   HGBA1C 9.9* 03/17/2012   His A1C has been uncontrolled for sometime and he routinely has blood sugars elevated over 400s in the office.  He has previously refused to let us treat his diabetes because of his belief that his body can not function with a CBG less then 200.  He also has a belief that Lantus causes cancer.  He has however been told by multiple physicians that the majority of his weight loss and decreased energy as well as his abdominal pain is likely caused at least in part by his uncontrolled diabetes.  He is willing to start therapy with Lantus.  He is concerned about dropping to low with 15 units of lantus which is his calculated likely starting point for lantus therapy.  We will start at 10 U and have him continue to check his blood sugar and bring his meter to his follow up appointment.  He likely will need to be titrated slowly because if he drops too fast he will likely abandon treatment.

## 2012-04-23 NOTE — Assessment & Plan Note (Signed)
He continues to have problems with nausea and vomiting and hasn't eaten much he states over the last 3 months.  Dr. Leone Payor is planning on doing an EGD on 12/6.  Protonix has helped and he has now been off of Advil but previously was taking 12-16 tablets daily for many years because of his pain.  We will give him some Zofran ODT to help with nausea and have him start a nutritional supplement to try to arrest his weight loss and help him get some protein back.

## 2012-04-23 NOTE — Assessment & Plan Note (Signed)
Likely secondary to his systolic HF.  Seems worse today likely because of an exacerbation of his chronic systolic heart failure.  We will increase his Lasix today and see him back on Monday in the clinic.

## 2012-04-23 NOTE — Assessment & Plan Note (Signed)
He still has not started his Cellcept and continues to decline despite treatment with prednisone and mestinon.  He is concerned about possible GI side effects and how it would affect his current stomach problems.  We discussed that he will continue to decline unless he takes steps to try to arrest the progression of his MG with the Cellcept.  He states that once he undergoes the EGD with Dr. Leone Payor he will start the Cellcept.

## 2012-04-23 NOTE — Assessment & Plan Note (Signed)
He has been having increased DOE, SOB, orthopnea, and peripheral edema.  His dry weight is difficult to know though he has significant edema in his lower extremities.    Basic Metabolic Panel:    Component Value Date/Time   NA 137 04/22/2012 1651   K 3.5 04/22/2012 1651   CL 96 04/22/2012 1651   CO2 32 04/22/2012 1651   BUN 14 04/22/2012 1651   CREATININE 0.92 04/22/2012 1651   CREATININE 0.9 03/17/2012 1655   GLUCOSE 424* 04/22/2012 1651   CALCIUM 8.9 04/22/2012 1651   His creatinine is at baseline and his potassium is adequate.  We will have him increase his Lasix to his dose of 120 BID over the weekend and he will return on Monday the 18th for a recheck of his Bmet and to reassess his symptoms.  He was advised that if he has any lightheadedness on standing or his breathing and swelling become worse instead of better he should be evaluated in the ED over the weekend.  He is in agreement.

## 2012-04-25 ENCOUNTER — Ambulatory Visit (INDEPENDENT_AMBULATORY_CARE_PROVIDER_SITE_OTHER): Payer: Medicare Other | Admitting: Internal Medicine

## 2012-04-25 ENCOUNTER — Encounter: Payer: Self-pay | Admitting: Internal Medicine

## 2012-04-25 VITALS — BP 146/78 | HR 88 | Temp 97.7°F | Ht 67.0 in | Wt 177.6 lb

## 2012-04-25 DIAGNOSIS — E1165 Type 2 diabetes mellitus with hyperglycemia: Secondary | ICD-10-CM

## 2012-04-25 DIAGNOSIS — M47812 Spondylosis without myelopathy or radiculopathy, cervical region: Secondary | ICD-10-CM

## 2012-04-25 DIAGNOSIS — M5412 Radiculopathy, cervical region: Secondary | ICD-10-CM

## 2012-04-25 DIAGNOSIS — I5022 Chronic systolic (congestive) heart failure: Secondary | ICD-10-CM

## 2012-04-25 MED ORDER — OXYCODONE HCL 5 MG PO TABS: 5.0000 mg | ORAL_TABLET | Freq: Three times a day (TID) | ORAL | Status: DC | PRN

## 2012-04-25 NOTE — Assessment & Plan Note (Signed)
He hasn't yet started Lantus. Advised him to start using it at 10 units. He says he will. Advised her to make an appointment with Dr. Tonny Branch last Friday of this month.

## 2012-04-25 NOTE — Assessment & Plan Note (Signed)
Patient requested refill of his OxyIR- 5 mg 90 pills.  I do not see a pain contract but will give him prescription as requested and let Dr. Tonny Branch decide about continuation of chronic pain regimen.

## 2012-04-25 NOTE — Assessment & Plan Note (Addendum)
Patient with recent exacerbation of his systolic heart failure the medication noncompliance. He was advised to go back on his home dose of 120 mg Lasix twice daily which he did and he is 7 pounds lighter than last visit about 3 days before. Swelling is improved. - Advised him to continue his home dose of Lasix. - Weigh himself regularly and if he keeps gaining weight instead of losing for next few days and over his leg swelling does not get better or he starts feeling more short of breath, call clinic to make appointment or go to the ED. - Discussed his lab results drawn on Friday with him- potassium 3.5 and creatinine 0.92. He is to continue taking potassium pills.

## 2012-04-25 NOTE — Progress Notes (Signed)
  Subjective:    Patient ID: [name], male    DOB: 04-06-1946, 66 y.o.   MRN: [id number]  HPI patient is a 66 year old man with uncontrolled DM 2, hypertension, myasthenia gravis, systolic heart failure who comes to the clinic for followup visit for his recent exacerbation of CHF. He was seen by his PCP on 04/22/2012- when he was found to have exacerbation of his systolic heart failure due to self decrease in dose of Lasix. He was supposed to be on 120 mg twice daily of Lasix- but due to him losing weight, he started skipping one dose daily and ended up in fluid overload.  He was advised to start back on his regular dose of Lasix- 120 mg twice daily- which he started taking and his weight today is 177 pounds from 184 pounds on Friday. He lost about 7 pounds in last 3 days. Denies any significant shortness of breath, has no PND or orthopnea. He reports that his leg swelling is somewhat better.  Denies any fever, chills, nausea vomiting, headache, chest pain.   Review of Systems    as per history of present illness Objective:   Physical Exam  General: NAD HEENT: PERRL, EOMI, no scleral icterus Cardiac: S1, S2, RRR, no rubs, murmurs or gallops Pulm: clear to auscultation bilaterally, moving normal volumes of air Abd: soft, nontender, nondistended, BS present Ext: warm and well perfused, 2+ pitting edema bilateral leg, trace edema in thighs bilaterally. Neuro: alert and oriented X3, cranial nerves II-XII grossly intact       Assessment & Plan:

## 2012-04-25 NOTE — Patient Instructions (Signed)
Please make a followup appointment with Dr. Tonny Branch on 05/06/2012, Friday or earlier if needed. If your start gaining weight and retaining more fluid in your lower extremities or start feeling short of breath, call us back for early appointment.   Meanwhile keep taking Lasix 120 mg twice daily. Check weight daily. Start taking insulin. Keep taking all the medications regularly.

## 2012-04-27 ENCOUNTER — Ambulatory Visit (AMBULATORY_SURGERY_CENTER): Payer: Medicare Other | Admitting: *Deleted

## 2012-04-27 VITALS — Ht 67.0 in | Wt 175.2 lb

## 2012-04-27 DIAGNOSIS — R109 Unspecified abdominal pain: Secondary | ICD-10-CM

## 2012-04-27 DIAGNOSIS — R634 Abnormal weight loss: Secondary | ICD-10-CM

## 2012-04-28 ENCOUNTER — Other Ambulatory Visit: Payer: Self-pay | Admitting: *Deleted

## 2012-04-28 MED ORDER — PEN NEEDLES 31G X 6 MM MISC: 1.0000 | Freq: Every day | Status: DC

## 2012-05-06 ENCOUNTER — Telehealth: Payer: Self-pay | Admitting: Internal Medicine

## 2012-05-06 NOTE — Telephone Encounter (Signed)
Mr. Bunt called the on call pager complaining of increased leg swelling.  He states that it has continued since he was seen in clinic.  He has also been to see his Cardiologist at [facility] and she increased his metolazone.  He has continued to take the Lasix 120 mg BID and the metolazone daily.  He states that his breathing is fine but his legs continue to swell and he is having problems walking with them.  He states that his cardiologist suggested that he come in to the hospital for diuresis but he declined.  He denies orthopnea, DOE, nausea, vomiting, or pain with the swelling in his legs.    He also states that he was able to eat some today and his blood sugar is high.  He has not been taking the Lantus.  He denies blurry vision, polyuria, or polydipsia.  He states that he has been drinking plenty of water to get his blood sugar down.  Plan: 1. Mr. Dinneen will increase his diuretic to 160 in the AM and 120 in the PM.  He will continue the Metolazone daily.  If he is not improving he will call on Monday or Tuesday and we will discuss from there if he needs to come in for diuresis.  He was advised in the mean time that if his breathing becomes difficult or he has greater increase in the swelling that he should be evaluated in the ED.   2. We discussed his blood sugar.  He has long term problems with control secondary to medication non-adherence.  We discussed again that his poorly controlled diabetes is likely the cause of his functional decline.  He was told to start the Lantus this evening.  Hindy Perrault, MD Internal Medicine Resident, PGY III Chief Resident, Internal Medicine Pager: [phone] 05/06/2012 9:52 PM

## 2012-05-09 ENCOUNTER — Encounter (HOSPITAL_COMMUNITY): Payer: Self-pay | Admitting: Pharmacy Technician

## 2012-05-10 ENCOUNTER — Ambulatory Visit (INDEPENDENT_AMBULATORY_CARE_PROVIDER_SITE_OTHER): Payer: Medicare Other | Admitting: Internal Medicine

## 2012-05-10 ENCOUNTER — Encounter (HOSPITAL_COMMUNITY)
Admission: RE | Disposition: A | Discharge status: Home / Self Care | Payer: Self-pay | Source: Ambulatory Visit | Attending: Internal Medicine

## 2012-05-10 ENCOUNTER — Telehealth: Payer: Self-pay | Admitting: Internal Medicine

## 2012-05-10 ENCOUNTER — Telehealth: Payer: Self-pay | Admitting: *Deleted

## 2012-05-10 ENCOUNTER — Telehealth: Payer: Self-pay

## 2012-05-10 VITALS — BP 147/80 | HR 94 | Temp 98.4°F | Ht 66.0 in | Wt 176.2 lb

## 2012-05-10 DIAGNOSIS — E8779 Other fluid overload: Secondary | ICD-10-CM

## 2012-05-10 DIAGNOSIS — Z8639 Personal history of other endocrine, nutritional and metabolic disease: Secondary | ICD-10-CM

## 2012-05-10 SURGERY — EGD (ESOPHAGOGASTRODUODENOSCOPY)
Anesthesia: Monitor Anesthesia Care

## 2012-05-10 NOTE — Telephone Encounter (Signed)
Pt wants to know if info from lisa  Rose-jones with [facility] has been sent here as well as two other dr he see;s there, not sure of their name,  wants to make sure we are in the loop with the three dr he is seeing from there, he wants his pcp to get this info , pls call [phone] ok to leave a message

## 2012-05-10 NOTE — Progress Notes (Signed)
Subjective:     Patient ID: [name], male   DOB: 1945-12-22, 66 y.o.   MRN: [id number]  Please refer to the other note for this visit. Sorry for any confusion.  HPI   Review of Systems     Objective:   Physical Exam

## 2012-05-10 NOTE — Telephone Encounter (Signed)
Spoke with pt

## 2012-05-10 NOTE — Progress Notes (Signed)
Subjective:     Patient ID: [name], male   DOB: 12-23-1945, 66 y.o.   MRN: [id number]  HPI 66 year old pleasant gentleman with a PMH of DM, CHF, HTN presents to the clinic for leg swelling and a check in Potassium. Dr. Tonny Branch prescribed 160mg  of Lasix in the AM, and 120mg  in the PM, but the patient has been taking 160mg  in the morning and at night and believes this is working to loose water weight and is tolerating it well. His blood sugars have been very high (300s - 400s) in the past, and he is now agreeable to try maintaining blood sugars around 200. He was resistant to lowering his blood glucose to that level because he said he feels "woozy" and "out of whack", but is now willing to control his diabetes better. He has no new complaints, and feels overall better with the increased lasix. He has been taking Potassium supplementation 3 times a day now that his lasix has been increased.   Past Medical History  Diagnosis Date  . Sinus tachycardia 2006    a. Nonobstructive CAD by cath 2013. b. s/p atrial tachycardia ablation 08/2011 (sinus node modification)  . Orthostatic hypotension   . Unspecified essential hypertension   . Hyperlipidemia   . 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 Cervical spondylosis with radiculopathy   . Syrinx     T5-T6  . Cord compression     Cord compression syndrome C5-C6, C6-C7  . ADD (attention deficit disorder)   . Decreased libido   . Depression   . DM (diabetes mellitus)     adult onset  . Sleep apnea     occassionally uses cpap  . Right bundle branch block and left posterior fascicular block   . Pulmonary nodule, left     Evaluated 04/2011 by pulmonology - felt to be benign granuloma  . Peripheral edema   . 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 LV dysfunction     By echo 2013 at [facility] - reported EF 45% per pt  . Colitis     with surface exudate  . Hemorrhoids      small  . Pseudomembranous colitis   . Gastritis   . Clostridium difficile infection 02/15/2012   Past Surgical History  Procedure Date  . Thymectomy april 2012  . Colonoscopy 11/13/2011    Procedure: COLONOSCOPY;  Surgeon: Theda Belfast, MD;  Location: [facility] ENDOSCOPY;  Service: Endoscopy;  Laterality: N/A;  . Cardiac catheterization    Family History  Problem Relation Age of Onset  . Breast cancer Mother   . Other Father     Beck's Sarcoid  . Colon cancer Neg Hx   . Prostate cancer Neg Hx    History   Social History  . Marital Status: Single    Spouse Name: N/A    Number of Children: 1  . Years of Education: N/A   Occupational History  . Retired     Surveyor, minerals   Social History Main Topics  . Smoking status: Former Smoker -- 2.5 packs/day for 30 years    Quit date: 06/09/1991  . Smokeless tobacco: Never Used  . Alcohol Use: No  . Drug Use: No  . Sexually Active: None   Other Topics Concern  . 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  HENT: Negative.   Eyes: Negative.   Respiratory: Negative.   Cardiovascular: Negative.   Gastrointestinal: Positive for abdominal pain.  Genitourinary: Negative.   Musculoskeletal: Positive for gait problem.  Skin: Negative.   Neurological: Negative.   Hematological: Negative.   Psychiatric/Behavioral: Negative.        Objective:   Physical Exam I did not perform a physical exam. Dr. Dalphine Handing performed a physical exam while I stepped out of the room to arrange lab-work.     Assessment/Plan:     66 year old male presents to the clinic for a follow up visit after changing lasix and asking for potassium check. 1. CHF - Continue taking Lasix 160mg  BID (morning and night) as patient is tolerating the increased dose well - Potassium Chloride 20 meq  TID - Potassium, Cr, BUN pending. Will send results to patient's cardiologist at [facility] (as per patient's request).  2. Diabetes - Increase Lantus to 18 units at bedtime  Follow up visit with Dr. Tonny Branch on December 13th. Encouraged patient to bring glucometer to next visit.

## 2012-05-10 NOTE — Telephone Encounter (Signed)
Pt calls and would like to have cmp and any other lab you think he needs to have " fluid removed from my body" and then he will call and tell triage nurse where he will have this done, [facility] or [facility]. Also he will need his insulin and blood sugars addressed.

## 2012-05-10 NOTE — Progress Notes (Signed)
Subjective:    Patient ID: [name], male    DOB: Oct 11, 1945, 66 y.o.   MRN: [id number]  HPI [name] is a 66 year old man, whose past medical history is pertinent for diabetes mellitus type 2, congestive diastolic heart failure, multiple questionable allergies, and myasthenia gravis. He also, I came to know, from previous notes and by talking to his PCP [name], has some peculiar thought processes due to which he is only partially compliant with the recommended treatment for his diseases. Many times, he tries to manage his own medications, and has his own terms for his treatment, for example, for his diabetes he says he does not want to lower his blood sugar below 200 because his body does not function below that level, and that he knows his body best.   Well, today, he visits the clinic to specifically get a potassium level done. He says that he felt that he needed to "get rid of some fluid in his legs" and therefore, he himself increased his lasix from 160 mg in the morning and 120 mg in the evening to 160 mg twice daily for the past few days. He says he feels much better and feels that his leg swelling has gone down a bit and he has lost a couple pounds. He says that he checked with his cardiologist and he wanted him to get a potassium level done. He does have exertional dyspnea but that is chronic. He has exertional dyspnea, which is chronic and not worsened, denies any chest pain, palpitations or chest tightness, cough or any other complaints at this time.  He also wants to revise his insulin. He decided that "oral hypoglycemics" do not suit him, and changed over to insulin, however, he has been taking only 15 units night time of Lantus, again because of the fear of getting his blood sugars below a certain level. Today, he felt like he could go a "little bit" up on the insulin. His blood sugars done in the clinic in the recent past, have been running in 300's and 400's. His last A1c was 9.9  on 03/17/12. He has not brought his glucometer today.   He did not delve into his other chronic issues today, and offered no complaints regarding them.    Review of Systems  Constitutional: Positive for fatigue. Negative for unexpected weight change.  HENT: Negative.   Eyes: Negative.   Respiratory: Negative.  Negative for cough, chest tightness and shortness of breath.   Cardiovascular: Positive for leg swelling. Negative for chest pain and palpitations.  Gastrointestinal: Negative.   Genitourinary: Negative.   Musculoskeletal: Negative.   Skin: Negative.   Neurological: Negative.   Hematological: Negative.   Psychiatric/Behavioral: Negative.        Objective:   Physical Exam  General: Well developed, no distress HEENT: PERRLA, EOMI, no deformities, neck supple, moist pharynx, no abnormality detected. Heart: S1 S2 normal with occasional ectopic beats, no murmurs. Lungs:Clear to auscultation. Abdomen: Non-tender, soft, BS present Extremities: pulses present distally, difficult to palpate in legs due to edema, 3+ pitting edema up to mid calves bilaterally, mild erythema over the same area.     Assessment & Plan:   Fluid overload - [name] has increased his lasix to 160 mg twice daily on his own for the past few days to decreased the swelling in his legs. Clinically, he appeared to be doing well. I checked a BMP and his potassium levels are 3.9. However his BUN  is increased to 62 while creatinine remains 0.86 which gives him a BUN/Cr ratio of almost 70 which is concerning. I think with his hyperglycemia and increased dose of lasix, [name] has become increasingly dry. I will call him today and ask him to stop taking his lasix for 1 day and take some fluids orally. Then he should resume lasix at 60 mg twice daily until Friday and increase to 80 mg twice daily over the weekend, and come see [place] Monday for a repeat BMP. We will then readjust his lasix based on his labs.  This seems to  be the most plausible reason for this condition. He denied any GI bleeding at the time of the visit, however I would ask him specifically about this when I call him.     Uncontrolled Diabetes -  His hyperglycemia is contributing to his pre-renal state. Yesterday I requested him to increase his insulin to 18 units and he agreed. I told him that he needs a lot more than that, however, he would like to monitor his blood sugars on this dose first.   For the rest of the chronic issues which do not seem to be contributing to the current problem, he will follow up with [name].    Telephone call (9 am, 05/11/12): I left a message for [name] to call the clinic back. I will update triage about this, and ask [name], our RN from yesterday to get in touch with him as soon as possible.   Next visit: Monday 05/16/12 - repeat BMP.

## 2012-05-10 NOTE — Patient Instructions (Addendum)
[name],  You visited the clinic today because you have increased your lasix dosage and you wanted to confirm if it was okay, and your cardiologist wanted you to get a potassium check. We did the blood-work to check basic electrolytes and your kidneys today. You feel that that the swelling in your feet is better since you increased the lasix. You do not have any other complaints today and we would inform you about your lab results if anything is abnormal.   For your diabetes, you do not want to drop your blood sugars too much, so we have increased your Lantus to 18 units at bedtime. Please bring your glucometer in your next visit.  Your next visit with your PCP  (Dr. Thayer Ohm Pribula)is in 2 weeks.

## 2012-05-10 NOTE — Telephone Encounter (Signed)
Pt called back and was angry because triage nurse would not do everything that he asked her to do at that time. He was informed that dr Tonny Branch had asked him to either have an appt in clinic asap or to see the cardiologist that he has spoken of at [facility] for fluid retention and possible inpt management of this. He demanded that dr rose-jones be sent office notes from visits in [facility] and that her office be ask to send her office notes also he is upset that triage nurse would not order the labs that he, the pt, thought he needed at this time. Triage nurse explained that he needed to sign a release of information for communication with dr rose-jones office and that this could be done this pm at the appt made for him. Triage spoke w/ him for and scheduled appt. Dr Tonny Branch made aware of pt's behavior.

## 2012-05-10 NOTE — Telephone Encounter (Signed)
Patient has questions about getting lidocaine for IV prior to procedure.  Patient is advised that he will need to discuss with anesthesia at his PAT as they will be the ones inserting his IV

## 2012-05-11 ENCOUNTER — Telehealth: Payer: Self-pay | Admitting: Internal Medicine

## 2012-05-11 ENCOUNTER — Telehealth: Payer: Self-pay | Admitting: *Deleted

## 2012-05-11 LAB — BASIC METABOLIC PANEL WITH GFR
Calcium: 9.5 mg/dL (ref 8.4–10.5)
Creat: 0.86 mg/dL (ref 0.50–1.35)
GFR, Est African American: >89 mL/min
Glucose, Bld: 390 mg/dL — ABNORMAL HIGH (ref 70–99)
Sodium: 140 mEq/L (ref 135–145)

## 2012-05-11 NOTE — Telephone Encounter (Signed)
Call pt - appt schedule for Monday 12/9 @ 13315PM pe Dr Dalphine Handing; for stat BMP and office visit to adjust Lasix.

## 2012-05-11 NOTE — Telephone Encounter (Signed)
Talked to [name] and discussed his blood work which shows that he is possibly becoming increasingly dry. I asked him to cut back on his lasix per my note in the recent visit from yesterday. So, he wrote down that he will take no lasix today, 60 BID on Thursday and Friday, 80 BID on Saturday and Sunday and in the morning on Monday, and come back Monday to get a BMP checked. I will talk to RN and get him scheduled. I also told him to call the clinic or go to ED if he felt sick. He denied any bright red blood per rectum or black stools. I would continue his metolazone for now because that dose was unchanged and he seemed to be doing well on it. I also encouraged him to meet with his cardiologist sooner to readjust lasix dose and keep her in the loop.I will press on it more when he comes in on Monday.

## 2012-05-12 ENCOUNTER — Ambulatory Visit (HOSPITAL_COMMUNITY)
Admission: RE | Admit: 2012-05-12 | Discharge: 2012-05-12 | Disposition: A | Discharge status: Home / Self Care | Payer: Medicare Other | Source: Ambulatory Visit | Attending: Internal Medicine | Admitting: Internal Medicine

## 2012-05-12 ENCOUNTER — Other Ambulatory Visit: Payer: Self-pay | Admitting: *Deleted

## 2012-05-12 ENCOUNTER — Encounter (HOSPITAL_COMMUNITY): Payer: Self-pay

## 2012-05-12 ENCOUNTER — Encounter (HOSPITAL_COMMUNITY)
Admission: RE | Admit: 2012-05-12 | Discharge: 2012-05-12 | Disposition: A | Discharge status: Home / Self Care | Payer: Medicare Other | Source: Ambulatory Visit | Attending: Internal Medicine | Admitting: Internal Medicine

## 2012-05-12 DIAGNOSIS — J9819 Other pulmonary collapse: Secondary | ICD-10-CM | POA: Insufficient documentation

## 2012-05-12 DIAGNOSIS — I10 Essential (primary) hypertension: Secondary | ICD-10-CM | POA: Insufficient documentation

## 2012-05-12 DIAGNOSIS — Z01818 Encounter for other preprocedural examination: Secondary | ICD-10-CM | POA: Insufficient documentation

## 2012-05-12 DIAGNOSIS — G7000 Myasthenia gravis without (acute) exacerbation: Secondary | ICD-10-CM | POA: Insufficient documentation

## 2012-05-12 DIAGNOSIS — E119 Type 2 diabetes mellitus without complications: Secondary | ICD-10-CM | POA: Insufficient documentation

## 2012-05-12 DIAGNOSIS — J90 Pleural effusion, not elsewhere classified: Secondary | ICD-10-CM | POA: Insufficient documentation

## 2012-05-12 DIAGNOSIS — G4700 Insomnia, unspecified: Secondary | ICD-10-CM

## 2012-05-12 DIAGNOSIS — Z01812 Encounter for preprocedural laboratory examination: Secondary | ICD-10-CM | POA: Insufficient documentation

## 2012-05-12 LAB — CBC
HCT: 39.9 % (ref 39.0–52.0)
Hemoglobin: 14.1 g/dL (ref 13.0–17.0)
MCH: 27.4 pg (ref 26.0–34.0)
MCHC: 35.3 g/dL (ref 30.0–36.0)
RDW: 13.1 % (ref 11.5–15.5)

## 2012-05-12 LAB — BASIC METABOLIC PANEL
BUN: 54 mg/dL — ABNORMAL HIGH (ref 6–23)
Calcium: 9.3 mg/dL (ref 8.4–10.5)
GFR calc Af Amer: >90 mL/min (ref >90)
GFR calc non Af Amer: 90 mL/min — ABNORMAL LOW (ref >90)
Glucose, Bld: 325 mg/dL — ABNORMAL HIGH (ref 70–99)
Potassium: 3.5 mEq/L (ref 3.5–5.1)
Sodium: 135 mEq/L (ref 135–145)

## 2012-05-12 MED ORDER — ZOLPIDEM TARTRATE 10 MG PO TABS: 10.0000 mg | ORAL_TABLET | Freq: Every day | ORAL | Status: DC

## 2012-05-12 NOTE — Pre-Procedure Instructions (Signed)
DR. ROSE NOTIFIED OF PT'S PREOP BMET RESULTS TODAY - GLUCOSE 325, BUN 54 AND NOTIFIED OF PREOP CXR RESULTS.  DR. ROSE STATES CBG TO BE DONE IMMEDIATELY ON PT'S ARRIVAL TO ENDO AND NOTIFY ANESTHESIOLOGIST IF GLUCOSE GREATER THAN 250.  ORDER PLACE IN EPIC AND NOTE ON PT'S CHART AND DR. Leone Payor IN ENDO AND MADE AWARE OF GLUCOSE AND BUN.

## 2012-05-12 NOTE — Patient Instructions (Addendum)
Your procedure is scheduled on: Friday  12/6      Report to [facility] Admitting at: 8:15 AM  Call this number if you have problems morning of your [phone]   Do not eat or drink anything after midnight the night before your procedure. You may brush your teeth, rinse out your mouth, but no water, no food, no chewing gum, no mints, no candies, no chewing tobacco.     Take these medicines the morning of your procedure with A FEW SIPS OF WATER:  OXYCODONE IF NEEDED FOR PAIN, PROTONIX, PREDNISONE, MESTINON DR. ROSE - ANESTHESIOLOGIST WANTS PT INSTRUCTED TO TAKE 20 UNITS OF LANTUS INSULIN TONIGHT.   Please make arrangements for a responsible person to drive you home after the procedure. We recommend you have someone with you at home the first 24 hours after your procedure. Driver for procedure is  Warden/ranger AND PT'S MOTHER WILL BE WITH PT AT HIS HOME.  DR. ROSE ANESTHESIOLOGIST NOTIFIED AND APPROVED PT GOING HOME IN TAXI  LEAVE ALL VALUABLES, JEWELRY, BILLFOLD AT HOME.  NO DENTURES, CONTACT LENSES ALLOWED IN THE ENDOSCOPY ROOM.   YOU MAY WEAR DEODORANT, PLEASE REMOVE ALL JEWELRY, WATCHES RINGS, BODY PIERCINGS AND LEAVE AT HOME.

## 2012-05-12 NOTE — Telephone Encounter (Signed)
Ambien rx called to [facility].

## 2012-05-12 NOTE — Anesthesia Preprocedure Evaluation (Addendum)
Anesthesia Evaluation  Patient identified by MRN, date of birth, ID band Patient awake    Reviewed: Allergy & Precautions, H&P , NPO status , Patient's Chart, lab work & pertinent test results  Airway Mallampati: II TM Distance: >3 FB Neck ROM: full    Dental No notable dental hx. (+) Teeth Intact and Dental Advisory Given   Pulmonary neg pulmonary ROS, shortness of breath and with exertion, sleep apnea , COPD breath sounds clear to auscultation  Pulmonary exam normal       Cardiovascular Exercise Tolerance: Good hypertension, Pt. on medications + CAD and +CHF negative cardio ROS  + dysrhythmias Atrial Fibrillation Rhythm:Irregular Rate:Normal  Non-obstructive CAD by cath 2013.  S/p atrial tachycardia ablation 3/13. Echo 2013 EF 45%.  RBBB LPFB ( bifascicular block).  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  Myasthenia gravis.  Neuromuscular disease negative neurological ROS  negative psych ROS   GI/Hepatic negative GI ROS, Neg liver ROS,   Endo/Other  diabetes, Well Controlled, Type 2, Insulin Dependent  Renal/GU negative Renal ROS  negative genitourinary   Musculoskeletal negative musculoskeletal ROS (+)   Abdominal   Peds negative pediatric ROS (+)  Hematology negative hematology ROS (+)   Anesthesia Other Findings   Reproductive/Obstetrics negative OB ROS                        Anesthesia Physical Anesthesia Plan  ASA: III  Anesthesia Plan: MAC   Post-op Pain Management:    Induction:   Airway Management Planned: Nasal Cannula  Additional Equipment:   Intra-op Plan:   Post-operative Plan:   Informed Consent: I have reviewed the patients History and Physical, chart, labs and discussed the procedure including the risks, benefits and alternatives for the proposed anesthesia with the patient or authorized representative who has indicated his/her  understanding and acceptance.   Dental Advisory Given  Plan Discussed with: CRNA and Surgeon  Anesthesia Plan Comments:         Anesthesia Quick Evaluation

## 2012-05-12 NOTE — Progress Notes (Signed)
Spoke with Dr. Leone Payor regarding this patient insisting upon taking a taxi home after his procedure on 05/13/12.  Dr. Leone Payor states that the patient was told during his office visit that he would have to have a responsible driver with him.  He also stated that we would not do his procedure without a driver present.  I called [name] to inform him without an answer but left a message for him to return my call.  Awaiting a return call.

## 2012-05-12 NOTE — Pre-Procedure Instructions (Signed)
DR. ROSE WAS NOTIFIED OF PT'S ENDO PROCEDURE WITH ANESTHESIA TOMORROW  05/13/12--AND BMET REPORT FROM YESTERDAY 12/4 -GLUCOSE 390 AND BUN 62, ABNORMAL CXR REPORT IN EPIC FROM 12/14/11; EKG REPORT IN EPIC FROM 12/25/11.  DR. ROSE STATES TO INSTRUCT PT TO TAKE 20 UNITS OF LANTUS INSULIN TONIGHT AND DO PREOP CBC AND BMET  AND CXR TODAY--MAY USE THE EKG REPORT IN EPIC.  DR. ROSE MADE AWARE PT PLANS TO GO HOME ALONE BY TAXI-STATES HIS MOTHER WILL BE AT HIS HOME--DR. ROSE OK'D. LABS AND CXR WERE DONE TODAY AND PT WAS INSTRUCTED ABOUT THE INSULIN TONIGHT.

## 2012-05-13 ENCOUNTER — Encounter (HOSPITAL_COMMUNITY): Payer: Self-pay | Admitting: Anesthesiology

## 2012-05-13 ENCOUNTER — Encounter (HOSPITAL_COMMUNITY)
Admission: RE | Disposition: A | Discharge status: Home / Self Care | Payer: Self-pay | Source: Ambulatory Visit | Attending: Internal Medicine

## 2012-05-13 ENCOUNTER — Encounter (HOSPITAL_COMMUNITY): Payer: Self-pay | Admitting: *Deleted

## 2012-05-13 ENCOUNTER — Ambulatory Visit (HOSPITAL_COMMUNITY): Payer: Medicare Other | Admitting: Anesthesiology

## 2012-05-13 ENCOUNTER — Ambulatory Visit (HOSPITAL_COMMUNITY)
Admission: RE | Admit: 2012-05-13 | Discharge: 2012-05-13 | Disposition: A | Discharge status: Home / Self Care | Payer: Medicare Other | Source: Ambulatory Visit | Attending: Internal Medicine | Admitting: Internal Medicine

## 2012-05-13 ENCOUNTER — Encounter: Payer: Medicare Other | Admitting: Internal Medicine

## 2012-05-13 DIAGNOSIS — I2510 Atherosclerotic heart disease of native coronary artery without angina pectoris: Secondary | ICD-10-CM | POA: Insufficient documentation

## 2012-05-13 DIAGNOSIS — E785 Hyperlipidemia, unspecified: Secondary | ICD-10-CM | POA: Insufficient documentation

## 2012-05-13 DIAGNOSIS — R634 Abnormal weight loss: Secondary | ICD-10-CM

## 2012-05-13 DIAGNOSIS — R6881 Early satiety: Secondary | ICD-10-CM | POA: Insufficient documentation

## 2012-05-13 DIAGNOSIS — R1013 Epigastric pain: Secondary | ICD-10-CM | POA: Diagnosis present

## 2012-05-13 DIAGNOSIS — G4730 Sleep apnea, unspecified: Secondary | ICD-10-CM | POA: Insufficient documentation

## 2012-05-13 DIAGNOSIS — K449 Diaphragmatic hernia without obstruction or gangrene: Secondary | ICD-10-CM | POA: Insufficient documentation

## 2012-05-13 DIAGNOSIS — E119 Type 2 diabetes mellitus without complications: Secondary | ICD-10-CM | POA: Insufficient documentation

## 2012-05-13 DIAGNOSIS — R112 Nausea with vomiting, unspecified: Secondary | ICD-10-CM | POA: Insufficient documentation

## 2012-05-13 DIAGNOSIS — I10 Essential (primary) hypertension: Secondary | ICD-10-CM | POA: Insufficient documentation

## 2012-05-13 HISTORY — PX: ESOPHAGOGASTRODUODENOSCOPY: SHX5428

## 2012-05-13 SURGERY — EGD (ESOPHAGOGASTRODUODENOSCOPY)
Anesthesia: Monitor Anesthesia Care

## 2012-05-13 MED ORDER — MIDAZOLAM HCL 5 MG/5ML IJ SOLN
INTRAMUSCULAR | Status: DC | PRN
  Administered 2012-05-13: 2 mg via INTRAVENOUS

## 2012-05-13 MED ORDER — PROPOFOL 10 MG/ML IV EMUL
INTRAVENOUS | Status: DC | PRN
  Administered 2012-05-13: 100 ug/kg/min via INTRAVENOUS

## 2012-05-13 MED ORDER — INSULIN ASPART 100 UNIT/ML ~~LOC~~ SOLN
SUBCUTANEOUS | Status: DC | PRN
  Administered 2012-05-13: 4 [IU] via SUBCUTANEOUS

## 2012-05-13 MED ORDER — FENTANYL CITRATE 0.05 MG/ML IJ SOLN: 25.0000 ug | INTRAMUSCULAR | Status: DC | PRN

## 2012-05-13 MED ORDER — FENTANYL CITRATE 0.05 MG/ML IJ SOLN: INTRAMUSCULAR | Status: DC | PRN

## 2012-05-13 MED ORDER — LACTATED RINGERS IV SOLN
INTRAVENOUS | Status: DC
  Administered 2012-05-13: 1000 mL via INTRAVENOUS

## 2012-05-13 MED ORDER — FENTANYL CITRATE 0.05 MG/ML IJ SOLN
INTRAMUSCULAR | Status: DC | PRN
  Administered 2012-05-13: 50 ug via INTRAVENOUS

## 2012-05-13 MED ORDER — INSULIN ASPART 100 UNIT/ML ~~LOC~~ SOLN
8.0000 [IU] | Freq: Once | SUBCUTANEOUS | Status: DC
  Filled 2012-05-13: qty 0.08

## 2012-05-13 MED ORDER — LACTATED RINGERS IV SOLN
INTRAVENOUS | Status: DC | PRN
  Administered 2012-05-13: 10:00:00 via INTRAVENOUS

## 2012-05-13 MED ORDER — INSULIN ASPART 100 UNIT/ML ~~LOC~~ SOLN
0.0000 [IU] | SUBCUTANEOUS | Status: DC
  Filled 2012-05-13: qty 3

## 2012-05-13 MED ORDER — SODIUM CHLORIDE 0.9 % IV SOLN: INTRAVENOUS | Status: DC

## 2012-05-13 NOTE — OR Nursing (Signed)
Pt stated he needed to take his Mestinon pre-procedure.  Spoke to Dr. Leta Jungling with anesthesia, stated from an anesthesia stand point that pt should take the medication, but to defer to Dr. Leone Payor to make the decision.  Dr. Leone Payor stated that he was fine with pt taking medicine if anesthesia was fine.  Pt took Mestinon pill at 0844 with 1/4 of a cup of water.  Pt stated pill was stuck in his throat and needed more water.  Pt took pill with 1/2 cup of water.  Angelique Blonder, Rn

## 2012-05-13 NOTE — Transfer of Care (Signed)
Immediate Anesthesia Transfer of Care Note  Patient: [name]  Procedure(s) Performed: Procedure(s) (LRB) with comments: ESOPHAGOGASTRODUODENOSCOPY (EGD) (N/A) - needs PAT w/ dx of Myastenia gravis and multiple meds. Pt requested lidocaine to numb IV site prior to start.  Very needle phobic.  Patient Location: PACU and Endoscopy Unit  Anesthesia Type:MAC  Level of Consciousness: awake, alert , oriented and patient cooperative  Airway & Oxygen Therapy: Patient Spontanous Breathing and Patient connected to nasal cannula oxygen  Post-op Assessment: Report given to PACU RN, Post -op Vital signs reviewed and stable and Patient moving all extremities  Post vital signs: Reviewed and stable  Complications: No apparent anesthesia complications

## 2012-05-13 NOTE — Op Note (Signed)
[facility] [address] [place], [address]   ENDOSCOPY PROCEDURE REPORT  PATIENT: [name], [name]  MR#: [id number] BIRTHDATE: January 28, 1946 , 66  yrs. old GENDER: Male ENDOSCOPIST: Iva Boop, MD, [facility] PROCEDURE DATE:  05/13/2012 PROCEDURE:  EGD, diagnostic ASA CLASS:     Class III INDICATIONS:  Epigastric pain.  Early satiety, nausea and vomiting MEDICATIONS: See Anesthesia Report and MAC sedation, administered by CRNA TOPICAL ANESTHETIC: none  DESCRIPTION OF PROCEDURE: After the risks benefits and alternatives of the procedure were thoroughly explained, informed consent was obtained.  The Pentax Gastroscope [id number] endoscope was introduced through the mouth and advanced to the second portion of the duodenum. Without limitations.  The instrument was slowly withdrawn as the mucosa was fully examined.        ESOPHAGUS: A 3 cm hiatal hernia was noted.  STOMACH: The mucosa of the stomach appeared normal.  DUODENUM: The duodenal mucosa showed no abnormalities.  Retroflexed views revealed a hiatal hernia.     The scope was then withdrawn from the patient and the procedure completed.  COMPLICATIONS: There were no complications. ENDOSCOPIC IMPRESSION: 1.   3 cm hiatal hernia 2.   The mucosa of the stomach appeared normal 3.   The duodenal mucosa showed no abnormalities  RECOMMENDATIONS: Control blood sugars See me in January - can discuss arranging colonoscopy   eSigned:  Iva Boop, MD, [facility] 05/13/2012 10:32 AM  ON:[id number], Cristal Deer MD and The Patient

## 2012-05-13 NOTE — Anesthesia Postprocedure Evaluation (Signed)
  Anesthesia Post-op Note  Patient: [name]  Procedure(s) Performed: Procedure(s) (LRB): ESOPHAGOGASTRODUODENOSCOPY (EGD) (N/A)  Patient Location: PACU  Anesthesia Type: MAC  Level of Consciousness: awake and alert   Airway and Oxygen Therapy: Patient Spontanous Breathing  Post-op Pain: mild  Post-op Assessment: Post-op Vital signs reviewed, Patient's Cardiovascular Status Stable, Respiratory Function Stable, Patent Airway and No signs of Nausea or vomiting  Last Vitals:  Filed Vitals:   05/13/12 1026  BP: 129/77  Temp:   Resp: 16    Post-op Vital Signs: stable   Complications: No apparent anesthesia complications

## 2012-05-13 NOTE — H&P (Signed)
Cc/HPI:  The patient presents for evaluation of early satiety, epigastric pain and nausea and vomiting.  Allergies  Allergen Reactions  . Aminoglycosides     MG patient  . Beta Adrenergic Blockers Other (See Comments)    MG patient  . Calcium Channel Blockers Other (See Comments)    MG patient  . Metoprolol Other (See Comments)    MG patient  . Neuromuscular Blocking Agents Other (See Comments)    MG patient  . Other Other (See Comments)    Myasthenia Gravis patient  . Penicillins Other (See Comments)    MG  . Quinine Derivatives Other (See Comments)    MG patient   @ENCMEDSTART @ Past Medical History  Diagnosis Date  . Sinus tachycardia 2006    a. Nonobstructive CAD by cath 2013. b. s/p atrial tachycardia ablation 08/2011 (sinus node modification)  . Orthostatic hypotension   . Unspecified essential hypertension   . Hyperlipidemia   . Cervical spondylosis with radiculopathy   . Syrinx     T5-T6  . Cord compression     Cord compression syndrome C5-C6, C6-C7  . ADD (attention deficit disorder)   . Decreased libido   . Depression   . DM (diabetes mellitus)     adult onset  . Pulmonary nodule, left     Evaluated 04/2011 by pulmonology - felt to be benign granuloma  . Peripheral edema   . 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 LV dysfunction     By echo 2013 at [facility] - reported EF 45% per pt  . Colitis     with surface exudate  . Hemorrhoids     small  . Pseudomembranous colitis   . Gastritis   . Clostridium difficile infection 02/15/2012  . Shortness of breath   . CHF (congestive heart failure)   . Right bundle branch block and left posterior fascicular block   . Sleep apnea     occassionally uses cpap  . 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 Thymectomy april 2012  . Colonoscopy 11/13/2011    Procedure: COLONOSCOPY;  Surgeon: Theda Belfast,  MD;  Location: [facility] ENDOSCOPY;  Service: Endoscopy;  Laterality: N/A;  . Cardiac catheterization    History   Social History  . Marital Status: Single    Spouse Name: N/A    Number of Children: 1  . Years of Education: N/A   Occupational History  . Retired     Surveyor, minerals   Social History Main Topics  . Smoking status: Former Smoker -- 2.5 packs/day for 30 years    Quit date: 06/09/1991  . Smokeless tobacco: Never Used  . Alcohol Use: No  . Drug Use: No  . Sexually Active: None   Other Topics Concern  . 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 Breast cancer Mother   . Other Father     Beck's Sarcoid  . Colon cancer Neg Hx   . Prostate cancer Neg Hx     PE:   General:  NAD Eyes:   anicteric Lungs:  clear Heart:  S1S2 no rubs, murmurs or gallops Abdomen:  soft and nontender, BS+ Ext:   no  edema    Ass/Plan:  Epigastric pain, nausea and vomiting, early satiety. DM probably playing a role as is uncontrolled.  EGD to look for PUD, gastritis, neoplasia, other problems. The risks and benefits as well as alternatives of endoscopic procedure(s) have been discussed and reviewed. All questions answered. The patient agrees to proceed.

## 2012-05-16 ENCOUNTER — Telehealth: Payer: Self-pay | Admitting: Internal Medicine

## 2012-05-16 ENCOUNTER — Encounter: Payer: Medicare Other | Admitting: Radiation Oncology

## 2012-05-16 ENCOUNTER — Telehealth: Payer: Self-pay | Admitting: *Deleted

## 2012-05-16 ENCOUNTER — Encounter (HOSPITAL_COMMUNITY): Payer: Self-pay | Admitting: Internal Medicine

## 2012-05-16 ENCOUNTER — Ambulatory Visit (INDEPENDENT_AMBULATORY_CARE_PROVIDER_SITE_OTHER): Payer: Medicare Other | Admitting: Radiation Oncology

## 2012-05-16 VITALS — BP 162/73 | HR 89 | Temp 99.0°F | Wt 184.7 lb

## 2012-05-16 DIAGNOSIS — I509 Heart failure, unspecified: Secondary | ICD-10-CM

## 2012-05-16 DIAGNOSIS — I5022 Chronic systolic (congestive) heart failure: Secondary | ICD-10-CM

## 2012-05-16 LAB — GLUCOSE, CAPILLARY: Glucose-Capillary: 279 mg/dL — ABNORMAL HIGH (ref 70–99)

## 2012-05-16 LAB — PRO B NATRIURETIC PEPTIDE: Pro B Natriuretic peptide (BNP): 6190 pg/mL — ABNORMAL HIGH (ref <126)

## 2012-05-16 LAB — BASIC METABOLIC PANEL
BUN: 56 mg/dL — ABNORMAL HIGH (ref 6–23)
Calcium: 9.5 mg/dL (ref 8.4–10.5)
Glucose, Bld: 287 mg/dL — ABNORMAL HIGH (ref 70–99)
Sodium: 138 mEq/L (ref 135–145)

## 2012-05-16 NOTE — Assessment & Plan Note (Addendum)
This issue is is presumably the cause of the patient's lower extremity edema as well as his shortness of breath. Unclear if patient is having a CHF exacerbation at present, however this seems likely considering the patient's compliance and also that he has gained 10 pounds since the previous encounter at the clinic on 12.3.2013. Patient was informed that he would benefit from being admitted to the hospital at this time for IV diuresis and further evaluation and management, however he declined this option and instead decided to leave AGAINST MEDICAL ADVICE. The patient was agreeable however to return to clinic tomorrow for subsequent evaluation and for possible admission at that time. A BMP and pro BNP were drawn during this visit, and results are pending. Patient was instructed to continue taking 80 mg of Lasix twice a day until contacted regarding his laboratory results. If patient's BUN, which was elevated at the time of his previous visit, his lower per this BMP, patient will be contacted and instructed to increase his Lasix dose, likely to 120 mg twice a day in the interim until tomorrow's visit. -Contact patient regarding BMP results, with Lasix dose changes pending results -Outpatient followup tomorrow, with possible hospital admission for IV diuresis if the patient is amenable to this decision -Await pro BNP results

## 2012-05-16 NOTE — Telephone Encounter (Signed)
Lab Results  Component Value Date   NA 138 05/16/2012   K 3.0* 05/16/2012   CL 97 05/16/2012   CO2 32 05/16/2012   BUN 56* 05/16/2012   CREATININE 0.76 05/16/2012    I returned call from patient regarding his labs done today.  His potassium is low at 3.0; he has been taking potassium chloride 20 meq one tab BID, and missed this morning's dose.  I advised him to take 2 tabs=40 meq now (5:15 PM), take another 2 tabs=40 meq at 10:00 PM, and to take 2 tabs=40 meq tomorrow morning.  He has return appointment tomorrow at 2:15 PM.  He will continue the same furosemide dose until he is seen tomorrow.

## 2012-05-16 NOTE — Telephone Encounter (Signed)
Message left with "secretary" to obtain fax # to fax records to Dr.Lisa Rose-Jones.  Attempted to contact office for number, no answer, message left. Will call back tomorrow.Danny Spittle Cassady12/9/20135:03 PM

## 2012-05-16 NOTE — Patient Instructions (Signed)
Continue to take your medications as prescribed, with NO changes at this time. You will be contacted regarding the results of your lab work, and a change in your medications and/or dosages may be made at that time depending on the results. Again, we recommend that you be admitted to the hospital at this time for your illness, however as you feel you are unable to do so, we will have you return tomorrow for a follow-up and possible admission. Please go to the emergency room if you have any new or worsening symptoms in the meantime.

## 2012-05-16 NOTE — Progress Notes (Signed)
Subjective:    Patient ID: [name], male    DOB: Jun 14, 1945, 66 y.o.   MRN: [id number]  HPI Patient is a 66 year old man with past medical history chronic systolic heart failure, type 2 diabetes mellitus, myasthenia gravis, OSA, who presents to clinic today for followup from a clinic visit last week at which time patient had complaints of bilateral lower extremity peripheral edema. Patient states that since that time the peripheral edema has persisted, and he also has had shortness of breath. Following his previous clinic visit, patient was instructed to temporarily discontinue his Lasix for one day, then to titrate upwards to 80 mg twice a day, which she has been taking for the last 2 days. Prior to his Lasix being temporarily discontinued, patient had been taking 160 mg twice a day which had been stopped at the previous visit due to concerns for hypovolemia. Also in the interim since the patient's previous visit he underwent upper endoscopy on 12.6.2013, which revealed findings consistent with a hiatal hernia but with no other abnormalities. Patient had a chest x-ray on 12.5 at 2013 which revealed stable right pleural effusion, with resolution of a previous left pleural effusion (in comparison to study from 03/2012). Patient states that he believes he is having increased fatigue recently secondary to his poorly controlled diabetes mellitus and hyperglycemia, but has no other complaints today other than those aforementioned.  Patient states that he previously saw Dr. Graciela Husbands for his systolic CHF, however he is now seen by cardiologist at [facility] in [facility]. Patient is unable to reliably relay information concerning visits with this provider and possible associated diagnoses or changes in management of his CHF or other conditions. 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   Review of Systems  Constitutional: Negative for fever and chills.  HENT: Negative.   Eyes: Negative.   Respiratory: Positive for shortness of breath. Negative for cough, chest tightness and wheezing.   Cardiovascular: Positive for leg swelling. Negative for chest pain and palpitations.  Gastrointestinal: Negative for nausea, vomiting, abdominal pain, diarrhea and blood in stool.  Genitourinary: Negative.   Musculoskeletal: Negative.   Skin: Negative.   Neurological: Negative for dizziness, light-headedness and headaches.  Hematological: Negative.   Psychiatric/Behavioral: Positive for confusion and decreased concentration.       Objective:   Physical Exam  Constitutional: He is oriented to person, place, and time. He appears well-developed and well-nourished. No distress.  HENT:  Head: Normocephalic and atraumatic.  Eyes: Pupils are equal, round, and reactive to light. No scleral icterus.  Neck: Normal range of motion. Neck supple. No tracheal deviation present. No thyromegaly present.  Cardiovascular: Normal rate and regular rhythm.   No murmur heard. Pulmonary/Chest: Effort normal and breath sounds normal. He has no wheezes. He has no rales.  Abdominal: Soft. Bowel sounds are normal. He exhibits no distension. There is no tenderness.  Musculoskeletal: Normal range of motion. He exhibits edema (3+ pretibial pitting edema bilaterally).  Neurological: He is alert and oriented to person, place, and time. No cranial nerve deficit.  Skin: Skin is warm and dry. There is erythema (mild erythema of bilateral lower extremities below the knee).  Psychiatric: He has a normal mood and affect. His behavior is normal.  Assessment & Plan:

## 2012-05-17 ENCOUNTER — Encounter: Payer: Medicare Other | Admitting: Radiation Oncology

## 2012-05-17 ENCOUNTER — Telehealth: Payer: Self-pay | Admitting: *Deleted

## 2012-05-17 NOTE — Telephone Encounter (Signed)
Pt called and left a message this am that someone had called him this am and he needed a call back. i called him back and he stated he did not call that it must have been an old message. He at that time stated he would be in clinic this pm for his appt w/ dr Lavena Bullion. He has just now called and cancelled the appt today and then ask if i would call him, i called and he states he will not be to clinic for appt this pm but will need to be called back w/ results of his "chemistry" i ask him to explain and he stated that he could come for bloodwork today but not a visit w/ physician. i stated that if he would need to come for his appt today. He states he will wait until Friday w/ dr Tonny Branch

## 2012-05-17 NOTE — Telephone Encounter (Signed)
Contacted office again, fax number given is [phone].  Pt pt's request, records faxed.Kingsley Spittle Cassady12/10/20134:47 PM

## 2012-05-18 ENCOUNTER — Encounter (HOSPITAL_COMMUNITY): Payer: Self-pay | Admitting: Family Medicine

## 2012-05-18 ENCOUNTER — Encounter (HOSPITAL_COMMUNITY): Payer: Self-pay | Admitting: *Deleted

## 2012-05-18 ENCOUNTER — Telehealth: Payer: Self-pay | Admitting: *Deleted

## 2012-05-18 ENCOUNTER — Emergency Department (HOSPITAL_COMMUNITY): Payer: Medicare Other

## 2012-05-18 ENCOUNTER — Ambulatory Visit (INDEPENDENT_AMBULATORY_CARE_PROVIDER_SITE_OTHER): Payer: Medicare Other | Admitting: Radiation Oncology

## 2012-05-18 ENCOUNTER — Emergency Department (HOSPITAL_COMMUNITY)
Admission: EM | Admit: 2012-05-18 | Discharge: 2012-05-18 | Discharge status: Against Medical Advice | Payer: Medicare Other | Attending: Emergency Medicine | Admitting: Emergency Medicine

## 2012-05-18 ENCOUNTER — Observation Stay (HOSPITAL_BASED_OUTPATIENT_CLINIC_OR_DEPARTMENT_OTHER)
Admission: AD | Admit: 2012-05-18 | Discharge: 2012-05-20 | Disposition: A | Discharge status: Home / Self Care | Payer: Medicare Other | Source: Ambulatory Visit | Attending: Internal Medicine | Admitting: Internal Medicine

## 2012-05-18 VITALS — BP 152/70 | HR 90 | Temp 98.2°F | Wt 184.0 lb

## 2012-05-18 DIAGNOSIS — IMO0002 Reserved for concepts with insufficient information to code with codable children: Secondary | ICD-10-CM | POA: Diagnosis present

## 2012-05-18 DIAGNOSIS — J449 Chronic obstructive pulmonary disease, unspecified: Secondary | ICD-10-CM | POA: Diagnosis present

## 2012-05-18 DIAGNOSIS — R7989 Other specified abnormal findings of blood chemistry: Secondary | ICD-10-CM

## 2012-05-18 DIAGNOSIS — I5032 Chronic diastolic (congestive) heart failure: Secondary | ICD-10-CM | POA: Diagnosis present

## 2012-05-18 DIAGNOSIS — Z87891 Personal history of nicotine dependence: Secondary | ICD-10-CM | POA: Insufficient documentation

## 2012-05-18 DIAGNOSIS — R609 Edema, unspecified: Secondary | ICD-10-CM | POA: Insufficient documentation

## 2012-05-18 DIAGNOSIS — Z8709 Personal history of other diseases of the respiratory system: Secondary | ICD-10-CM | POA: Insufficient documentation

## 2012-05-18 DIAGNOSIS — R0600 Dyspnea, unspecified: Secondary | ICD-10-CM

## 2012-05-18 DIAGNOSIS — E1165 Type 2 diabetes mellitus with hyperglycemia: Secondary | ICD-10-CM | POA: Diagnosis present

## 2012-05-18 DIAGNOSIS — G4730 Sleep apnea, unspecified: Secondary | ICD-10-CM | POA: Insufficient documentation

## 2012-05-18 DIAGNOSIS — E119 Type 2 diabetes mellitus without complications: Secondary | ICD-10-CM | POA: Insufficient documentation

## 2012-05-18 DIAGNOSIS — I5022 Chronic systolic (congestive) heart failure: Secondary | ICD-10-CM

## 2012-05-18 DIAGNOSIS — E785 Hyperlipidemia, unspecified: Secondary | ICD-10-CM | POA: Insufficient documentation

## 2012-05-18 DIAGNOSIS — I2510 Atherosclerotic heart disease of native coronary artery without angina pectoris: Secondary | ICD-10-CM | POA: Insufficient documentation

## 2012-05-18 DIAGNOSIS — Z8669 Personal history of other diseases of the nervous system and sense organs: Secondary | ICD-10-CM | POA: Insufficient documentation

## 2012-05-18 DIAGNOSIS — G4700 Insomnia, unspecified: Secondary | ICD-10-CM

## 2012-05-18 DIAGNOSIS — R0609 Other forms of dyspnea: Secondary | ICD-10-CM | POA: Insufficient documentation

## 2012-05-18 DIAGNOSIS — Z87898 Personal history of other specified conditions: Secondary | ICD-10-CM | POA: Insufficient documentation

## 2012-05-18 DIAGNOSIS — I10 Essential (primary) hypertension: Secondary | ICD-10-CM | POA: Insufficient documentation

## 2012-05-18 DIAGNOSIS — R0989 Other specified symptoms and signs involving the circulatory and respiratory systems: Secondary | ICD-10-CM | POA: Insufficient documentation

## 2012-05-18 DIAGNOSIS — I509 Heart failure, unspecified: Secondary | ICD-10-CM | POA: Diagnosis present

## 2012-05-18 DIAGNOSIS — Z8719 Personal history of other diseases of the digestive system: Secondary | ICD-10-CM | POA: Insufficient documentation

## 2012-05-18 DIAGNOSIS — J4489 Other specified chronic obstructive pulmonary disease: Secondary | ICD-10-CM | POA: Diagnosis present

## 2012-05-18 DIAGNOSIS — M5412 Radiculopathy, cervical region: Secondary | ICD-10-CM

## 2012-05-18 DIAGNOSIS — Z8679 Personal history of other diseases of the circulatory system: Secondary | ICD-10-CM | POA: Insufficient documentation

## 2012-05-18 DIAGNOSIS — Z9989 Dependence on other enabling machines and devices: Secondary | ICD-10-CM | POA: Insufficient documentation

## 2012-05-18 DIAGNOSIS — Z8659 Personal history of other mental and behavioral disorders: Secondary | ICD-10-CM | POA: Insufficient documentation

## 2012-05-18 DIAGNOSIS — Z79899 Other long term (current) drug therapy: Secondary | ICD-10-CM | POA: Insufficient documentation

## 2012-05-18 DIAGNOSIS — Z794 Long term (current) use of insulin: Secondary | ICD-10-CM | POA: Insufficient documentation

## 2012-05-18 DIAGNOSIS — Z8619 Personal history of other infectious and parasitic diseases: Secondary | ICD-10-CM | POA: Insufficient documentation

## 2012-05-18 DIAGNOSIS — Z8739 Personal history of other diseases of the musculoskeletal system and connective tissue: Secondary | ICD-10-CM | POA: Insufficient documentation

## 2012-05-18 LAB — CBC WITH DIFFERENTIAL/PLATELET
Basophils Absolute: 0 10*3/uL (ref 0.0–0.1)
Basophils Relative: 0 % (ref 0–1)
Eosinophils Absolute: 0.2 10*3/uL (ref 0.0–0.7)
MCH: 27.9 pg (ref 26.0–34.0)
MCHC: 35.6 g/dL (ref 30.0–36.0)
Neutro Abs: 8.7 10*3/uL — ABNORMAL HIGH (ref 1.7–7.7)
Neutrophils Relative %: 79 % — ABNORMAL HIGH (ref 43–77)
Platelets: 260 10*3/uL (ref 150–400)

## 2012-05-18 LAB — BASIC METABOLIC PANEL
GFR calc Af Amer: >90 mL/min (ref >90)
GFR calc non Af Amer: >90 mL/min (ref >90)
Potassium: 3.6 mEq/L (ref 3.5–5.1)
Sodium: 135 mEq/L (ref 135–145)

## 2012-05-18 LAB — PRO B NATRIURETIC PEPTIDE: Pro B Natriuretic peptide (BNP): 7283 pg/mL — ABNORMAL HIGH (ref 0–125)

## 2012-05-18 LAB — TROPONIN I: Troponin I: <0.3 ng/mL (ref <0.30)

## 2012-05-18 LAB — GLUCOSE, CAPILLARY
Glucose-Capillary: 149 mg/dL — ABNORMAL HIGH (ref 70–99)
Glucose-Capillary: 149 mg/dL — ABNORMAL HIGH (ref 70–99)

## 2012-05-18 MED ORDER — INSULIN ASPART 100 UNIT/ML ~~LOC~~ SOLN: 0.0000 [IU] | Freq: Three times a day (TID) | SUBCUTANEOUS | Status: DC

## 2012-05-18 MED ORDER — SODIUM CHLORIDE 0.9 % IJ SOLN
3.0000 mL | Freq: Two times a day (BID) | INTRAMUSCULAR | Status: DC
  Administered 2012-05-18 – 2012-05-20 (×4): 3 mL via INTRAVENOUS

## 2012-05-18 MED ORDER — INSULIN GLARGINE 100 UNIT/ML ~~LOC~~ SOLN: 14.0000 [IU] | Freq: Every day | SUBCUTANEOUS | Status: DC

## 2012-05-18 MED ORDER — ZOLPIDEM TARTRATE 5 MG PO TABS
5.0000 mg | ORAL_TABLET | Freq: Every day | ORAL | Status: DC
  Administered 2012-05-18: 5 mg via ORAL
  Filled 2012-05-18: qty 1

## 2012-05-18 MED ORDER — PYRIDOSTIGMINE BROMIDE 60 MG PO TABS
60.0000 mg | ORAL_TABLET | Freq: Every day | ORAL | Status: DC
  Administered 2012-05-18 – 2012-05-20 (×8): 60 mg via ORAL
  Filled 2012-05-18 (×13): qty 1

## 2012-05-18 MED ORDER — PANTOPRAZOLE SODIUM 40 MG PO TBEC
40.0000 mg | DELAYED_RELEASE_TABLET | Freq: Two times a day (BID) | ORAL | Status: DC
  Administered 2012-05-18 – 2012-05-20 (×3): 40 mg via ORAL
  Filled 2012-05-18 (×3): qty 1

## 2012-05-18 MED ORDER — FUROSEMIDE 10 MG/ML IJ SOLN
80.0000 mg | Freq: Three times a day (TID) | INTRAMUSCULAR | Status: DC
  Filled 2012-05-18 (×4): qty 8

## 2012-05-18 MED ORDER — FUROSEMIDE 10 MG/ML IJ SOLN
80.0000 mg | Freq: Three times a day (TID) | INTRAMUSCULAR | Status: DC
  Administered 2012-05-18 – 2012-05-19 (×2): 80 mg via INTRAVENOUS
  Filled 2012-05-18 (×3): qty 8

## 2012-05-18 MED ORDER — OXYCODONE HCL 5 MG PO TABS
5.0000 mg | ORAL_TABLET | Freq: Three times a day (TID) | ORAL | Status: DC | PRN
  Administered 2012-05-18 – 2012-05-19 (×2): 5 mg via ORAL
  Filled 2012-05-18 (×3): qty 1

## 2012-05-18 MED ORDER — FUROSEMIDE 10 MG/ML IJ SOLN
40.0000 mg | Freq: Once | INTRAMUSCULAR | Status: AC
  Administered 2012-05-18: 40 mg via INTRAVENOUS
  Filled 2012-05-18: qty 4

## 2012-05-18 MED ORDER — PREDNISONE 5 MG PO TABS
15.0000 mg | ORAL_TABLET | Freq: Every day | ORAL | Status: DC
  Administered 2012-05-18 – 2012-05-20 (×3): 15 mg via ORAL
  Filled 2012-05-18 (×3): qty 1

## 2012-05-18 MED ORDER — BENAZEPRIL HCL 40 MG PO TABS
40.0000 mg | ORAL_TABLET | Freq: Every day | ORAL | Status: DC
  Filled 2012-05-18: qty 1

## 2012-05-18 MED ORDER — ENOXAPARIN SODIUM 40 MG/0.4ML ~~LOC~~ SOLN
40.0000 mg | SUBCUTANEOUS | Status: DC
  Filled 2012-05-18 (×3): qty 0.4

## 2012-05-18 MED ORDER — POTASSIUM CHLORIDE CRYS ER 20 MEQ PO TBCR
20.0000 meq | EXTENDED_RELEASE_TABLET | Freq: Two times a day (BID) | ORAL | Status: DC
  Administered 2012-05-18 – 2012-05-20 (×4): 20 meq via ORAL
  Filled 2012-05-18 (×5): qty 1

## 2012-05-18 NOTE — Telephone Encounter (Signed)
Thanks [employer].  Can you please call [name] today and get him to come to clinic.  He left AMA from the ED last night in a CHF exacerbation and Dr. Norlene Campbell who saw him is very concerned.  I'm in [place] all day so I won't be able to pop down to see him.

## 2012-05-18 NOTE — ED Provider Notes (Signed)
History     CSN: [id number]  Arrival date & time 05/18/12  0247   First MD Initiated Contact with Patient 05/18/12 [phone]      Chief Complaint  Patient presents with  . Shortness of Breath    (Consider location/radiation/quality/duration/timing/severity/associated sxs/prior treatment) HPI 66 year old male presents emergency department from home with complaint of shortness of breath. Patient has history of congestive heart failure as well as myasthenia gravis. Patient unsure which problem is contributing to his symptoms at that time. He is requesting a shot of Lasix, and a dose of his pyridostigmine.  He denies any fever or chills. He does not remember anything in his diet that may have prompted symptoms. He was seen in the outpatient clinics earlier this week and recommended admission at that time due to concern for CHF exacerbation. He left the clinic AMA. He was due to be seen in the clinic yesterday, and declined to go to that visit. He has follow up with his primary care Dr. Sharen Hones Friday. Patient was told that his BUN was elevated, and has been drinking "lots" more water to help with his dehydration. Patient reports he eats a lot of American cheese, but has been doing this for months due to IBS. He feels like the processed cheese may be too salty. He reports he is very short of breath when he tries to lay down. He gets short of breath with walking short periods. He denies any chest pain, no fever or chills. Patient had been on Lasix 180, but his primary care doctor was working at to 160 twice a day. During my interview, patient reports his breathing was feeling much better and he felt that pyridostimine stating that he took at 2 AM was starting to kick in. Past Medical History  Diagnosis Date  . Sinus tachycardia 2006    a. Nonobstructive CAD by cath 2013. b. s/p atrial tachycardia ablation 08/2011 (sinus node modification)  . Orthostatic hypotension   . Unspecified essential hypertension   .  Hyperlipidemia   . Cervical spondylosis with radiculopathy   . Syrinx     T5-T6  . Cord compression     Cord compression syndrome C5-C6, C6-C7  . ADD (attention deficit disorder)   . Decreased libido   . Depression   . DM (diabetes mellitus)     adult onset  . Pulmonary nodule, left     Evaluated 04/2011 by pulmonology - felt to be benign granuloma  . Peripheral edema   . 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 LV dysfunction     By echo 2013 at [facility] - reported EF 45% per pt  . Colitis     with surface exudate  . Hemorrhoids     small  . Pseudomembranous colitis   . Gastritis   . Clostridium difficile infection 02/15/2012  . Shortness of breath   . CHF (congestive heart failure)   . Right bundle branch block and left posterior fascicular block   . Sleep apnea     occassionally uses cpap  . 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 Thymectomy april 2012  . Colonoscopy 11/13/2011    Procedure: COLONOSCOPY;  Surgeon: Theda Belfast, MD;  Location: [facility] ENDOSCOPY;  Service: Endoscopy;  Laterality: N/A;  . Cardiac catheterization   . Esophagogastroduodenoscopy 05/13/2012    Procedure: ESOPHAGOGASTRODUODENOSCOPY (EGD);  Surgeon: Iva Boop, MD;  Location: [facility];  Service: Endoscopy;  Laterality: N/A;  needs PAT w/ dx of Myastenia gravis and multiple meds. Pt requested lidocaine to numb IV site prior to start.  Very needle phobic.    Family History  Problem Relation Age of Onset  . Breast cancer Mother   . Other Father     Beck's Sarcoid  . Colon cancer Neg Hx   . Prostate cancer Neg Hx     History  Substance Use Topics  . Smoking status: Former Smoker -- 2.5 packs/day for 30 years    Quit date: 06/09/1991  . Smokeless tobacco: Never Used  . Alcohol Use: No      Review of Systems  See History of Present Illness; otherwise all other systems  are reviewed and negative  Allergies  Aminoglycosides; Beta adrenergic blockers; Calcium channel blockers; Metoprolol; Neuromuscular blocking agents; Other; Penicillins; and Quinine derivatives  Home Medications   Current Outpatient Rx  Name  Route  Sig  Dispense  Refill  . BENAZEPRIL HCL 40 MG PO TABS   Oral   Take 40 mg by mouth daily before breakfast.         . FUROSEMIDE 40 MG PO TABS   Oral   Take 160 mg by mouth 2 (two) times daily. PT STATES HE WAS TOLD TO STOP FOR ONE DAY (WED 05/11/12 ) BECAUSE HIS BUN ELEVATED AND HE WAS "DRIED OUT".  PT STATES SUPPOSED TO RESTART DIFFERENT DOSAGE TODAY 05/12/12         . INSULIN GLARGINE 100 UNIT/ML [place] SOLN   Subcutaneous   Inject 18 Units into the skin at bedtime.         Marland Kitchen METOLAZONE 5 MG PO TABS   Oral   Take 1 tablet by mouth daily.          . OXYCODONE HCL 5 MG PO TABS   Oral   Take 1 tablet (5 mg total) by mouth every 8 (eight) hours as needed.   90 tablet   0   . PANTOPRAZOLE SODIUM 40 MG PO TBEC   Oral   Take 40 mg by mouth 2 (two) times daily.          Marland Kitchen POTASSIUM CHLORIDE CRYS ER 20 MEQ PO TBCR   Oral   Take 20 mEq by mouth 2 (two) times daily at 10 AM and 5 PM. With the lasix when he thinks about it         . PREDNISONE 5 MG PO TABS   Oral   Take 15 mg by mouth daily.         Marland Kitchen PYRIDOSTIGMINE BROMIDE 60 MG PO TABS   Oral   Take 1 tablet (60 mg total) by mouth 5 (five) times daily.         Marland Kitchen ZOLPIDEM TARTRATE 10 MG PO TABS   Oral   Take 1 tablet (10 mg total) by mouth at bedtime.   30 tablet   5   . MYCOPHENOLATE MOFETIL 500 MG PO TABS   Oral   Take 1,000 mg by mouth 2 (two) times daily.           BP 173/80  Pulse 94  Temp 98.1 F (36.7 C) (Oral)  Resp 22  SpO2 97%  Physical Exam  Nursing note and vitals reviewed. Constitutional: He is oriented to person, place, and time. He appears well-developed and well-nourished.  HENT:  Head: Normocephalic and atraumatic.  Nose:  Nose  normal.  Mouth/Throat: Oropharynx is clear and moist.  Eyes: Conjunctivae normal and EOM are normal. Pupils are equal, round, and reactive to light.  Neck: Normal range of motion. Neck supple. No JVD present. No tracheal deviation present. No thyromegaly present.  Cardiovascular: Normal rate, regular rhythm, normal heart sounds and intact distal pulses.  Exam reveals no gallop and no friction rub.   No murmur heard. Pulmonary/Chest: Effort normal. No stridor. No respiratory distress. He has no wheezes. He has rales. He exhibits no tenderness.       DOE noted  Abdominal: Soft. Bowel sounds are normal. He exhibits no distension and no mass. There is no tenderness. There is no rebound and no guarding.  Musculoskeletal: Normal range of motion. He exhibits edema (2+ pitting edema to knees). He exhibits no tenderness.  Lymphadenopathy:    He has no cervical adenopathy.  Neurological: He is alert and oriented to person, place, and time. He exhibits normal muscle tone. Coordination normal.  Skin: Skin is dry. No rash noted. No erythema. No pallor.  Psychiatric: His behavior is normal. Judgment and thought content normal.       Odd affect, anxious    ED Course  Procedures (including critical care time)  Labs Reviewed  CBC WITH DIFFERENTIAL - Abnormal; Notable for the following:    WBC 11.0 (*)     HCT 38.8 (*)     Neutrophils Relative 79 (*)     Neutro Abs 8.7 (*)     Lymphocytes Relative 8 (*)     Monocytes Absolute 1.1 (*)     All other components within normal limits  BASIC METABOLIC PANEL - Abnormal; Notable for the following:    Glucose, Bld 207 (*)     BUN 51 (*)     All other components within normal limits  PRO B NATRIURETIC PEPTIDE - Abnormal; Notable for the following:    Pro B Natriuretic peptide (BNP) 7283.0 (*)     All other components within normal limits  TROPONIN I   Dg Chest 2 View  05/18/2012  *RADIOLOGY REPORT*  Clinical Data: Chest pain and shortness of breath.   CHEST - 2 VIEW  Comparison: 05/12/2012  Findings: Stable appearance of mediastinal postoperative changes. Mild cardiac enlargement with increased pulmonary vascularity suggesting mild congestion.  This is progressing since the previous study.  Small bilateral pleural effusions with basilar atelectasis are not significantly changed.  No pneumothorax.  Calcification of the aorta.  Degenerative changes in the spine.  IMPRESSION: Bilateral pleural effusions with basilar atelectasis appear unchanged.  Developing pulmonary vascular congestion.   Original Report Authenticated By: Burman Nieves, M.D.      1. Dyspnea   2. Acute on chronic congestive heart failure   3. Abnormal BUN-to-creatinine ratio       MDM  66 year old male with dyspnea possibly multifactorial. Patient is difficult as he is refusing a lot of the diagnostic testing I would like to complete. Patient informed of all lab values and how they have changed from his most recent visit in the clinic. I strongly recommended to the patient that he be admitted to the hospital for close monitoring and management of his symptoms. Patient reports he has an 71 year old mother at home that he is a primary caregiver for and feels that he cannot leave her at this time. Patient is going to be leaving AMA. He reports that he is feeling much better after the IV Lasix, but also reports this immediately after receiving  it so I doubt that this is a true reaction. I will have his primary care Dr. touch base with him today. He has followup scheduled for this Friday.        Olivia Mackie, MD 05/18/12 [phone]

## 2012-05-18 NOTE — Assessment & Plan Note (Signed)
Patient is presently agreeable to inpatient admission for his current CHF exacerbation. Seems the patient has some issues with memory, as he is getting conflicting reports as to what doses of diuretics he's been taking, particularly in regard to his metolazone which he stated at his last visit he been taking daily, but today states that he has not taken greater than one week. He is consistent in describing that he has been taking his Lasix at 80 mg twice a day since instructed to decrease his dose 2 visits prior. -Admit the patient -No labs needed as patient had relevant labs performed early this a.m.  BNP    Component Value Date/Time   PROBNP 7283.0* 05/18/2012 0345    BMET    Component Value Date/Time   NA 135 05/18/2012 0347   K 3.6 05/18/2012 0347   CL 98 05/18/2012 0347   CO2 29 05/18/2012 0347   GLUCOSE 207* 05/18/2012 0347   BUN 51* 05/18/2012 0347   CREATININE 0.77 05/18/2012 0347   CREATININE 0.76 05/16/2012 1417   CALCIUM 9.6 05/18/2012 0347   GFRNONAA >90 05/18/2012 0347   GFRAA >90 05/18/2012 4098

## 2012-05-18 NOTE — Progress Notes (Signed)
  Subjective:    Patient ID: [name], male    DOB: 04-24-1946, 66 y.o.   MRN: [id number]  HPI Patient is a 66 year old man with past medical history significant for CHF who presents to clinic today for reevaluation for a CHF exacerbation, for which the patient was first seen in the clinic on 05/16/2012. Patient was to be admitted at that time, however he stated that he could not come into the hospital at that time, and left AGAINST MEDICAL ADVICE. The patient's symptoms persisted since that time, and he went to the ED yesterday evening for evaluation of the same issue. Chest x-ray performed immediately revealed worsening pulmonary congestion, and pro BNP was increasingly elevated. The patient again left AGAINST MEDICAL ADVICE after receiving a single dose of IV Lasix. At today's visit, the patient continues to complain of shortness of breath and worsening leg swelling consistent with his previous complaints during this CHF exacerbation. The patient denies that his symptoms have either improved or worsened since previous evaluation. He denies any symptoms. He denies chest pain. He states that he is willing to be admitted at this time, as he has come to the realization that this issue cannot be effectively managed with outpatient diuresis alone.  Review of Systems  Constitutional: Negative.   HENT: Negative.   Eyes: Negative.   Respiratory: Positive for shortness of breath. Negative for cough and wheezing.   Cardiovascular: Negative for chest pain and leg swelling.  Gastrointestinal: Negative for nausea, vomiting and diarrhea.  Genitourinary: Negative for dysuria and hematuria.  Musculoskeletal: Negative.   Skin: Negative.   Neurological: Negative.   Hematological: Negative.   Psychiatric/Behavioral: Negative.        Objective:   Physical Exam  Constitutional: He is oriented to person, place, and time. He appears well-developed and well-nourished. No distress.  HENT:  Head: Normocephalic  and atraumatic.  Eyes: Pupils are equal, round, and reactive to light. No scleral icterus.  Neck: Normal range of motion. Neck supple. No tracheal deviation present. No thyromegaly present.  Cardiovascular: Normal rate and regular rhythm.   No murmur heard. Pulmonary/Chest: Effort normal. He has no wheezes. He has no rales.  Abdominal: Soft. Bowel sounds are normal. He exhibits distension (mildly distended).  Musculoskeletal: He exhibits edema (3+ pitting pretibial edema bilaterally).  Neurological: He is alert and oriented to person, place, and time. No cranial nerve deficit.  Skin: Skin is warm and dry. No erythema.  Psychiatric: He has a normal mood and affect. His behavior is normal.          Assessment & Plan:

## 2012-05-18 NOTE — H&P (Signed)
Hospital Admission Note Date: 05/18/2012  Patient name: [name] Medical record number: [id number] Date of birth: March 30, 1946 Age: 66 y.o. Gender: male PCP: PRIBULA,CHRISTOPHER, MD  Medical Service: [facility]   Attending physician:  Dr. Aundria Rud   1st Contact: Dr. Zada Girt   Pager: [phone]  2nd Contact: Dr. Manson Passey   Pager: [phone]    After 5 pm or weekends:  1st Contact: Intern pager Pager: [phone]  2nd Contact: Resident pager Pager: [phone]  Chief Complaint: Shortness of breath for 1 week  History of Present Illness: [name] is a 66 year old gentleman with past medical history significant for CHF, OSA, DM 2, and Myasthenia Gravis who presents to clinic today for reevaluation for a CHF exacerbation, for which the patient was first seen in the clinic on 05/16/2012. Patient was to be admitted at that time, however he stated that he could not come into the hospital at that time, and left AGAINST MEDICAL ADVICE. The patient's symptoms persisted since that time, and he went to the ED yesterday evening for evaluation of the same issue. Chest x-ray performed immediately revealed worsening pulmonary congestion, and pro BNP was increasingly elevated. The patient again left AGAINST MEDICAL ADVICE after receiving a single dose of IV Lasix. At today's clinic visit, the patient continues to complain of shortness of breath and worsening leg swelling consistent with his previous complaints during this CHF exacerbation. The patient denies that his symptoms have either improved or worsened since previous evaluation. He denies any other symptoms. He denies chest pain or cough. He states that he is willing to be admitted at this time, as he has come to the realization that this is she cannot be effectively managed with outpatient diuresis alone. No fever, or chills. He hesitantly accepts admission but for only one day and he threatens to leave AMA if kept longer.   He used to work with [employer] 30  years ago before he went into [employer] until 6 years ago when he retired. He takes care of his 24 year old mother and he would want to return home as soon as he over the hump. He is been married X 2 and divorced. He has one son who is 43 years. Used to smoke 35 pack years. He quit in 1993. He does not take alcohol.  ECHO 2011: The estimated ejection fraction was in the range of 55% to 60%. Wall motion was normal; there were no regional wall motion abnormalities. Features are consistent with a pseudonormal left ventricular filling pattern, with concomitant abnormal relaxation and increased filling pressure (grade 2 diastolic dysfunction). As reported by patient another echo 2013 at [facility] EF 45%.   Meds: Current Facility-Administered Medications on File Prior to Encounter  Medication Dose Route Frequency Provider Last Rate Last Dose  . [COMPLETED] furosemide (LASIX) injection 40 mg  40 mg Intravenous Once Danny Mackie, MD   40 mg at 05/18/12 0422   Current Outpatient Prescriptions on File Prior to Encounter  Medication Sig Dispense Refill  . benazepril (LOTENSIN) 40 MG tablet Take 40 mg by mouth daily before breakfast.      . furosemide (LASIX) 40 MG tablet Take 160 mg by mouth 2 (two) times daily. PT STATES HE WAS TOLD TO STOP FOR ONE DAY (WED 05/11/12 ) BECAUSE HIS BUN ELEVATED AND HE WAS "DRIED OUT".  PT STATES SUPPOSED TO RESTART DIFFERENT DOSAGE TODAY 05/12/12      . insulin glargine (LANTUS) 100 UNIT/ML injection Inject 18 Units into the skin at  bedtime.      . metolazone (ZAROXOLYN) 5 MG tablet Take 1 tablet by mouth daily.       . mycophenolate (CELLCEPT) 500 MG tablet Take 1,000 mg by mouth 2 (two) times daily.      Marland Kitchen oxyCODONE (OXY IR/ROXICODONE) 5 MG immediate release tablet Take 1 tablet (5 mg total) by mouth every 8 (eight) hours as needed.  90 tablet  0  . pantoprazole (PROTONIX) 40 MG tablet Take 40 mg by mouth 2 (two) times daily.       . potassium chloride SA (K-DUR,KLOR-CON) 20 MEQ  tablet Take 20 mEq by mouth 2 (two) times daily at 10 AM and 5 PM. With the lasix when he thinks about it      . predniSONE (DELTASONE) 5 MG tablet Take 15 mg by mouth daily.      Marland Kitchen pyridostigmine (MESTINON) 60 MG tablet Take 1 tablet (60 mg total) by mouth 5 (five) times daily.      Marland Kitchen zolpidem (AMBIEN) 10 MG tablet Take 1 tablet (10 mg total) by mouth at bedtime.  30 tablet  5  . [DISCONTINUED] pantoprazole (PROTONIX) 40 MG tablet Take 1 tablet (40 mg total) by mouth daily.  30 tablet  2   Allergies: Allergies as of 05/18/2012 - Review Complete 05/18/2012  Allergen Reaction Noted  . Aminoglycosides  04/14/2011  . Beta adrenergic blockers Other (See Comments)   . Calcium channel blockers Other (See Comments)   . Metoprolol Other (See Comments) 09/12/2011  . Neuromuscular blocking agents Other (See Comments) 04/14/2011  . Other Other (See Comments) 09/12/2011  . Penicillins Other (See Comments) 09/12/2011  . Quinine derivatives Other (See Comments) 09/12/2011   Past Medical History  Diagnosis Date  . Sinus tachycardia 2006    a. Nonobstructive CAD by cath 2013. b. s/p atrial tachycardia ablation 08/2011 (sinus node modification)  . Orthostatic hypotension   . Unspecified essential hypertension   . Hyperlipidemia   . Cervical spondylosis with radiculopathy   . Syrinx     T5-T6  . Cord compression     Cord compression syndrome C5-C6, C6-C7  . ADD (attention deficit disorder)   . Decreased libido   . Depression   . DM (diabetes mellitus)     adult onset  . Pulmonary nodule, left     Evaluated 04/2011 by pulmonology - felt to be benign granuloma  . Peripheral edema   . 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 LV dysfunction     By echo 2013 at [facility] - reported EF 45% per pt  . Colitis     with surface exudate  . Hemorrhoids     small  . Pseudomembranous colitis   . Gastritis   . Clostridium difficile infection 02/15/2012  . Shortness of breath    . CHF (congestive heart failure)   . Right bundle branch block and left posterior fascicular block   . Sleep apnea     occassionally uses cpap  . 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 Thymectomy april 2012  . Colonoscopy 11/13/2011    Procedure: COLONOSCOPY;  Surgeon: Theda Belfast, MD;  Location: [facility] ENDOSCOPY;  Service: Endoscopy;  Laterality: N/A;  . Cardiac catheterization   . Esophagogastroduodenoscopy 05/13/2012    Procedure: ESOPHAGOGASTRODUODENOSCOPY (EGD);  Surgeon: Iva Boop, MD;  Location: [facility] ENDOSCOPY;  Service: Endoscopy;  Laterality: N/A;  needs PAT w/ dx of Myastenia gravis and multiple meds. Pt requested lidocaine to numb IV site prior to start.  Very needle phobic.   Family History  Problem Relation Age of Onset  . Breast cancer Mother   . Other Father     Beck's Sarcoid  . Colon cancer Neg Hx   . Prostate cancer Neg Hx    History   Social History  . Marital Status: Single    Spouse Name: N/A    Number of Children: 1  . Years of Education: N/A   Occupational History  . Retired     Surveyor, minerals   Social History Main Topics  . Smoking status: Former Smoker -- 2.5 packs/day for 30 years    Quit date: 06/09/1991  . Smokeless tobacco: Never Used  . Alcohol Use: No  . Drug Use: No  . Sexually Active: Not on file   Other Topics Concern  . 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  HENT: Negative.  Eyes: Negative.  Respiratory: Positive for shortness of breath. Negative for cough and wheezing.  Cardiovascular: Negative for chest pain and leg swelling.  Gastrointestinal: Negative for nausea, vomiting and diarrhea.  Genitourinary: Negative for dysuria and hematuria.  Musculoskeletal: Negative.   Skin: Negative.  Neurological: Negative.  Hematological: Negative.  Psychiatric/Behavioral: Negative.    Physical Exam: Blood pressure 135/103, pulse 118, temperature 97.8 F (36.6 C), resp. rate 18, height 5\' 7"  (1.702 m), weight 181 lb 10.5 oz (82.4 kg), SpO2 97.00%. Constitutional: He is oriented to person, place, and time. He appears well-developed and well-nourished. No distress.  HENT:  Head: Normocephalic and atraumatic.  Eyes: Pupils are equal, round, and reactive to light. No scleral icterus.  Neck: Normal range of motion. Neck supple. No tracheal deviation present. No thyromegaly present.  Cardiovascular: Normal rate and regular rhythm.  No murmur heard.  Pulmonary/Chest: Effort normal. He has no wheezes. He has no rales.  Abdominal: Soft. Bowel sounds are normal. He exhibits distension (mildly distended).  Musculoskeletal: He exhibits edema (3+ pitting pretibial edema bilaterally).  Neurological: He is alert and oriented to person, place, and time. No cranial nerve deficit.  Skin: Skin is warm and dry. No erythema.  Psychiatric: He has a normal mood and affect. His behavior is normal.   Lab results: Basic Metabolic Panel:  Basename 05/18/12 0347 05/16/12 1417  NA 135 138  K 3.6 3.0*  CL 98 97  CO2 29 32  GLUCOSE 207* 287*  BUN 51* 56*  CREATININE 0.77 0.76  CALCIUM 9.6 9.5  MG -- --  PHOS -- --   CBC:  Basename 05/18/12 0347  WBC 11.0*  NEUTROABS 8.7*  HGB 13.8  HCT 38.8*  MCV 78.5  PLT 260   Cardiac Enzymes:  Basename 05/18/12 0347  CKTOTAL --  CKMB --  CKMBINDEX --  TROPONINI <0.30   BNP:  Basename 05/18/12 0345 05/16/12 1417  PROBNP 7283.0* 6190.0*   CBG:  Basename 05/18/12 1439 05/16/12 1416  GLUCAP 149* 279*   Misc. Labs:   Imaging results:  Dg Chest 2 View  05/18/2012  *RADIOLOGY REPORT*  Clinical Data: Chest pain and shortness of breath.  CHEST - 2 VIEW  Comparison: 05/12/2012  Findings: Stable appearance of mediastinal  postoperative changes. Mild cardiac enlargement  with increased pulmonary vascularity suggesting mild congestion.  This is progressing since the previous study.  Small bilateral pleural effusions with basilar atelectasis are not significantly changed.  No pneumothorax.  Calcification of the aorta.  Degenerative changes in the spine.  IMPRESSION: Bilateral pleural effusions with basilar atelectasis appear unchanged.  Developing pulmonary vascular congestion.   Original Report Authenticated By: Burman Nieves, M.D.    Assessment & Plan by Problem:  Acute CHF exacerbation:  Danny Fregia is a 66 year old man with past medical history significant for CHF, OSA, DM 2, and Myasthenia Gravis who presents for inpatient diuresis for his CHF exacerbation,  Acute on Chronic CHF: Danny Luecke presented to the clinic with classical symptoms and signs of his CHF exacerbation. ECHO in 2011 showed EF of 55% to 60% with normal wall motion. However, he had a pseudonormal left ventricular filling pattern, with concomitant abnormal relaxation and increased filling pressure (grade 2 diastolic dysfunction). His chest x-ray revealed pulmonary congestion, secondary to fluid overload. He was not responding appropriately to the outpatient oral Lasix. He takes 160 mg twice daily of Lasix. He has probably gained about 7 pounds over the last couple of weeks. He also admits to a history of consuming lots of salt. He is proBNP had increased from 6000 to 7000 within the last 2 days. His basic metabolic panel, including potassium, and creatinine are within normal limits. However, his glucose is elevated at 207. He is being admitted today for short-term fluid management. The patient clearly indicates that he can't stay longer than one day. In the hospital. He does not have any other symptoms for his shortness of breath, headache, chest pain, cough, chills, or fevers. Does not have sputum production. Therefore, we do not suspect pneumonia, PE, or  cardiac arrhythmias to explain his acute CHF exacerbation. Plan -Admit telemetry -IV Lasix 80 mg 3 times daily. Once he is stable, he will be discharged on a good dose of oral Lasix. -Daily weights together with ins an outs. -BMET -Will do an EKG Type 2 diabetes: His home regimen for treatment of type 2 diabetes include Lantus 18 units at bedtime. Last HbA1c was 9.9% in October 2013. Plan -Lantus 14 units at bedtime. -NovoLog SSI -Monitor CBGs  Myasthenia gravis: Patient has 3 years history of myasthenia gravis being treated with Pyridostigmine. While in the hospital, he will continue with his current dose of 60 mg 5 times daily. He follows closely with his neurologist for this condition.  CAD: This is the most likely cause of his congestive heart failur and in a was e. However, the patient is not on optimal therapy due to intolerance of beta blockers secondary to myasthenia gravis. He is on benazepril and oral furosemide. We will continue with    Dispo: Disposition is deferred at this time, awaiting improvement of current medical problems. Anticipated discharge in approximately 1 day(s). The patient has habitually left the hospitals and the ED due AMA. He wants to stay for only one day.  The patient does have a current PCP (PRIBULA,CHRISTOPHER, MD), therefore will be requiring OPC follow-up after discharge.   The patient does not have transportation limitations that hinder transportation to clinic appointments.  Signed: Dow Adolph 05/18/2012, 4:54 PM

## 2012-05-18 NOTE — Progress Notes (Signed)
Report to Nurse on 4700 pt transported vis wheelchair to 4730.  IV team called to meet pt in room to start IV.  Angelina Ok, RN 05/18/2012 3:47 PM.

## 2012-05-18 NOTE — Telephone Encounter (Signed)
Have called 4 times, after 8 phone calls, i spoke w/ pt, he will come to the appt but does not like the idea and states he will not be staying

## 2012-05-18 NOTE — ED Notes (Signed)
Patient states he has myasthenia gravis and is short of breath. States shortness of breath is getting worse. States short of breath with exertion. Sitting in upright position helps him to breath some better but not much. Had an endoscopy on 12/9.

## 2012-05-19 DIAGNOSIS — I10 Essential (primary) hypertension: Secondary | ICD-10-CM

## 2012-05-19 DIAGNOSIS — I509 Heart failure, unspecified: Secondary | ICD-10-CM

## 2012-05-19 DIAGNOSIS — J449 Chronic obstructive pulmonary disease, unspecified: Secondary | ICD-10-CM

## 2012-05-19 LAB — GLUCOSE, CAPILLARY
Glucose-Capillary: 318 mg/dL — ABNORMAL HIGH (ref 70–99)
Glucose-Capillary: 384 mg/dL — ABNORMAL HIGH (ref 70–99)

## 2012-05-19 LAB — COMPREHENSIVE METABOLIC PANEL
ALT: 9 U/L (ref 0–53)
AST: 16 U/L (ref 0–37)
Albumin: 2.5 g/dL — ABNORMAL LOW (ref 3.5–5.2)
Alkaline Phosphatase: 110 U/L (ref 39–117)
CO2: 28 mEq/L (ref 19–32)
Calcium: 9.6 mg/dL (ref 8.4–10.5)
Creatinine, Ser: 0.78 mg/dL (ref 0.50–1.35)
GFR calc Af Amer: >90 mL/min (ref >90)
Glucose, Bld: 361 mg/dL — ABNORMAL HIGH (ref 70–99)
Potassium: 4.1 mEq/L (ref 3.5–5.1)
Sodium: 140 mEq/L (ref 135–145)
Total Bilirubin: 1.1 mg/dL (ref 0.3–1.2)

## 2012-05-19 LAB — CBC WITH DIFFERENTIAL/PLATELET
Basophils Relative: 0 % (ref 0–1)
Eosinophils Relative: 0 % (ref 0–5)
HCT: 39.1 % (ref 39.0–52.0)
Hemoglobin: 12.9 g/dL — ABNORMAL LOW (ref 13.0–17.0)
Lymphocytes Relative: 11 % — ABNORMAL LOW (ref 12–46)
Lymphs Abs: 0.7 10*3/uL (ref 0.7–4.0)
MCH: 26.3 pg (ref 26.0–34.0)
MCHC: 33 g/dL (ref 30.0–36.0)
MCV: 79.8 fL (ref 78.0–100.0)
Monocytes Absolute: 0.4 10*3/uL (ref 0.1–1.0)
Monocytes Relative: 6 % (ref 3–12)
Neutrophils Relative %: 83 % — ABNORMAL HIGH (ref 43–77)
RBC: 4.9 MIL/uL (ref 4.22–5.81)
WBC: 6.8 10*3/uL (ref 4.0–10.5)

## 2012-05-19 LAB — PROTIME-INR: Prothrombin Time: 12.8 seconds (ref 11.6–15.2)

## 2012-05-19 LAB — PRO B NATRIURETIC PEPTIDE: Pro B Natriuretic peptide (BNP): 8082 pg/mL — ABNORMAL HIGH (ref 0–125)

## 2012-05-19 MED ORDER — FUROSEMIDE 10 MG/ML IJ SOLN
120.0000 mg | Freq: Three times a day (TID) | INTRAVENOUS | Status: DC
  Administered 2012-05-19 – 2012-05-20 (×3): 120 mg via INTRAVENOUS
  Filled 2012-05-19 (×5): qty 12

## 2012-05-19 MED ORDER — FUROSEMIDE 10 MG/ML IJ SOLN: 120.0000 mg | Freq: Three times a day (TID) | INTRAMUSCULAR | Status: DC

## 2012-05-19 MED ORDER — METOLAZONE 5 MG PO TABS
5.0000 mg | ORAL_TABLET | Freq: Every day | ORAL | Status: DC
  Filled 2012-05-19: qty 1

## 2012-05-19 MED ORDER — ZOLPIDEM TARTRATE 5 MG PO TABS
5.0000 mg | ORAL_TABLET | Freq: Every evening | ORAL | Status: DC | PRN
  Administered 2012-05-19: 5 mg via ORAL
  Filled 2012-05-19: qty 1

## 2012-05-19 MED ORDER — OXYCODONE HCL 5 MG PO TABS
5.0000 mg | ORAL_TABLET | Freq: Four times a day (QID) | ORAL | Status: DC
  Administered 2012-05-19 – 2012-05-20 (×5): 5 mg via ORAL
  Filled 2012-05-19 (×5): qty 1

## 2012-05-19 MED ORDER — PYRIDOSTIGMINE BROMIDE 60 MG PO TABS
60.0000 mg | ORAL_TABLET | Freq: Once | ORAL | Status: AC
  Administered 2012-05-19: 60 mg via ORAL
  Filled 2012-05-19: qty 1

## 2012-05-19 MED ORDER — ZOLPIDEM TARTRATE 5 MG PO TABS: 10.0000 mg | ORAL_TABLET | Freq: Every day | ORAL | Status: DC

## 2012-05-19 MED ORDER — OXYCODONE HCL 5 MG PO TABS
5.0000 mg | ORAL_TABLET | Freq: Once | ORAL | Status: AC
  Administered 2012-05-19: 5 mg via ORAL

## 2012-05-19 NOTE — Progress Notes (Signed)
Subjective: The patient feels better today after he started passing a lot of urine. However, he has lost about 1 lb today Objective: Vital signs in last 24 hours: Filed Vitals:   05/19/12 0111 05/19/12 0500 05/19/12 0607 05/19/12 1045  BP: 155/85  147/93 175/93  Pulse: 94  68 94  Temp: 97.7 F (36.5 C)  98.1 F (36.7 C) 97.6 F (36.4 C)  TempSrc: Axillary  Oral Oral  Resp: 18  20 20   Height:      Weight:  181 lb 6.4 oz (82.283 kg) 181 lb 6.4 oz (82.283 kg)   SpO2: 95%  95% 93%   Weight change:   Intake/Output Summary (Last 24 hours) at 05/19/12 1352 Last data filed at 05/19/12 1043  Gross per 24 hour  Intake    846 ml  Output   1175 ml  Net   -329 ml   Physical Examination: Constitutional: He is oriented to person, place, and time. He appears well-developed and well-nourished. No distress.  HENT:  Head: Normocephalic and atraumatic.  Eyes: Pupils are equal, round, and reactive to light. No scleral icterus.  Neck: Normal range of motion. Neck supple. No tracheal deviation present. No thyromegaly present.  Cardiovascular: Normal rate and regular rhythm.  No murmur heard.  Pulmonary/Chest: Effort normal. He has no wheezes. He has no rales.  Abdominal: Soft. Bowel sounds are normal. He exhibits distension (mildly distended).  Musculoskeletal: He exhibits edema (3+ pitting pretibial edema bilaterally).  Neurological: He is alert and oriented to person, place, and time. No cranial nerve deficit.  Skin: Skin is warm and dry. No erythema.  Psychiatric: He has a normal mood and affect. His behavior is normal.   Lab Results: Basic Metabolic Panel:  Lab 05/19/12 4540 05/18/12 0347  NA 140 135  K 4.1 3.6  CL 101 98  CO2 28 29  GLUCOSE 361* 207*  BUN 43* 51*  CREATININE 0.78 0.77  CALCIUM 9.6 9.6  MG 1.4* --  PHOS -- --   Liver Function Tests:  Lab 05/19/12 0510  AST 16  ALT 9  ALKPHOS 110  BILITOT 1.1  PROT 6.1  ALBUMIN 2.5*   No results found for this basename:  LIPASE:2,AMYLASE:2 in the last 168 hours No results found for this basename: AMMONIA:2 in the last 168 hours CBC:  Lab 05/19/12 0510 05/18/12 0347  WBC 6.8 11.0*  NEUTROABS 5.6 8.7*  HGB 12.9* 13.8  HCT 39.1 38.8*  MCV 79.8 78.5  PLT 258 260   Cardiac Enzymes:  Lab 05/18/12 0347  CKTOTAL --  CKMB --  CKMBINDEX --  TROPONINI <0.30   BNP:  Lab 05/19/12 0510 05/18/12 0345 05/16/12 1417  PROBNP 8082.0* 7283.0* 6190.0*   D-Dimer: No results found for this basename: DDIMER:2 in the last 168 hours CBG:  Lab 05/19/12 1057 05/19/12 0620 05/18/12 2052 05/18/12 1439 05/16/12 1416 05/13/12 1036  GLUCAP 284* 318* 149* 149* 279* 274*   Hemoglobin A1C: No results found for this basename: HGBA1C in the last 168 hours Fasting Lipid Panel: No results found for this basename: CHOL,HDL,LDLCALC,TRIG,CHOLHDL,LDLDIRECT in the last 981 hours Thyroid Function Tests:  Lab 05/19/12 0510  TSH 1.351  T4TOTAL --  FREET4 --  T3FREE --  THYROIDAB --   Coagulation:  Lab 05/19/12 0510  LABPROT 12.8  INR 0.97    Studies/Results: Dg Chest 2 View  05/18/2012  *RADIOLOGY REPORT*  Clinical Data: Chest pain and shortness of breath.  CHEST - 2 VIEW  Comparison: 05/12/2012  Findings:  Stable appearance of mediastinal postoperative changes. Mild cardiac enlargement with increased pulmonary vascularity suggesting mild congestion.  This is progressing since the previous study.  Small bilateral pleural effusions with basilar atelectasis are not significantly changed.  No pneumothorax.  Calcification of the aorta.  Degenerative changes in the spine.  IMPRESSION: Bilateral pleural effusions with basilar atelectasis appear unchanged.  Developing pulmonary vascular congestion.   Original Report Authenticated By: Burman Nieves, M.D.    Medications: I have reviewed the patient's current medications. Scheduled Meds:   . enoxaparin (LOVENOX) injection  40 mg Subcutaneous Q24H  . furosemide  120 mg  Intravenous TID  . insulin aspart  0-9 Units Subcutaneous TID WC  . insulin glargine  14 Units Subcutaneous QHS  . [COMPLETED] oxyCODONE  5 mg Oral Once  . oxyCODONE  5 mg Oral Q6H  . pantoprazole  40 mg Oral BID  . potassium chloride SA  20 mEq Oral BID  . predniSONE  15 mg Oral Daily  . pyridostigmine  60 mg Oral 5 X Daily  . sodium chloride  3 mL Intravenous Q12H  . [DISCONTINUED] benazepril  40 mg Oral QAC breakfast  . [DISCONTINUED] furosemide  120 mg Intravenous TID  . [DISCONTINUED] furosemide  80 mg Intravenous TID  . [DISCONTINUED] furosemide  80 mg Intramuscular TID  . [DISCONTINUED] metolazone  5 mg Oral Daily  . [DISCONTINUED] zolpidem  10 mg Oral QHS  . [DISCONTINUED] zolpidem  5 mg Oral QHS   Continuous Infusions:  PRN Meds:.zolpidem, [DISCONTINUED] oxyCODONE Assessment/Plan: Mr Kassim is a 66 year old man with past medical history significant for CHF, OSA, DM 2, and Myasthenia Gravis who presents for inpatient diuresis for his CHF exacerbation,   Acute on Chronic CHF: Mr Hirota presented to the clinic with classical symptoms and signs of his CHF exacerbation. ECHO in 2011 showed EF of 55% to 60% with normal wall motion. However, he had a pseudonormal left ventricular filling pattern, with concomitant abnormal relaxation and increased filling pressure (grade 2 diastolic dysfunction). His chest x-ray revealed pulmonary congestion, secondary to fluid overload. He was not responding appropriately to the outpatient oral Lasix. He takes 160 mg twice daily of Lasix. He has probably gained about 7 pounds over the last couple of weeks. He also admits to a history of consuming lots of salt. He is proBNP had increased from 6000 to 7000 within the last 2 days. His basic metabolic panel, including potassium, and creatinine are within normal limits. However, his glucose is elevated at 207. He is being admitted today for short-term fluid management. The patient clearly indicates that he can't  stay longer than one day. In the hospital. He does not have any other symptoms for his shortness of breath, headache, chest pain, cough, chills, or fevers. Does not have sputum production. Therefore, we do not suspect pneumonia, PE, or cardiac arrhythmias to explain his acute CHF exacerbation. He as only lost utmost a pound since yesterday. He does want to have metalozone.  Plan  -increase IV Lasix 120 mg 3 times daily. If this does not reduce his weight, we might want to start spironolactone in addition to an appropriate dose of oral lasix. -he has been counselled on importance of  Salt restriction and his motivated to change -we will do urine protein/creatinine given his low albumin of 2.5 -Daily weights together with ins an outs. I believe his target weight is about 175lb -BMET    Type 2 diabetes: His home regimen for treatment of type 2 diabetes  include Lantus 18 units at bedtime. Last HbA1c was 9.9% in October 2013.  Plan  -Lantus 14 units at bedtime.  -NovoLog SSI  -Monitor CBGs   Myasthenia gravis: Patient has 3 years history of myasthenia gravis being treated with Pyridostigmine. While in the hospital, he will continue with his current dose of 60 mg 5 times daily. He follows closely with his neurologist for this condition.   CAD: This is the most likely cause of his congestive heart failur and in a was e. However, the patient is not on optimal therapy due to intolerance of beta blockers secondary to myasthenia gravis. He is on benazepril and oral furosemide.  Dispo: Disposition is deferred at this time, awaiting improvement of current medical problems. Anticipated discharge in approximately 1 day(s). The patient has habitually left the hospitals and the ED  AMA. He wants to stay for one more day.  The patient does have a current PCP (PRIBULA,CHRISTOPHER, MD), therefore will be requiring OPC follow-up after discharge.  The patient does not have transportation limitations that hinder  transportation to clinic appointments.    LOS: 1 day   Dow Adolph 05/19/2012, 1:52 PM

## 2012-05-19 NOTE — Progress Notes (Signed)
Inpatient Diabetes Program Recommendations  AACE/ADA: New Consensus Statement on Inpatient Glycemic Control (2013)  Target Ranges:  Prepandial:   less than 140 mg/dL      Peak postprandial:   less than 180 mg/dL (1-2 hours)      Critically ill patients:  140 - 180 mg/dL   Reason for Visit: High blood glucose and refusal of insulin   Note: Went in and talked with the patient about diabetes and glycemic control.  Patient states that he takes Lantus 18 units at bedtime at home.  When asked about refusing Lantus last night patient states that his sugar was too low and he "will not take Lantus if my blood sugar is low.  For me, when my blood glucose gets less than 200 I feel really sick with all the classic low blood sugar symptoms".  Tried to explain to the patient the need for Lantus to maintain glycemic control.  Patient states that he was knowledgeable on diabetes and insulin and he would take Lantus when he felt he needed to.  He states that he has an appointment with Internal Medicine clinic tomorrow and will talk with them about adjusting his Lantus.  However, he refuses to take insulin while in the hospital unless he feels he needs it.  Patient states "I know my body and I am not going to make myself feel worse by taking insulin just because the doctors don't want my sugar numbers high".  Blood sugar was 284 mg/dl at 16:10 am.  The nurse came in and offered Novolog correction and again the patient refused. Talked with the patient about carb counting and diet control.  Patient states that he would like to talk with someone about low sodium diet but was not interested in carb counting because it was to complicated and too much information.  Informed patient about the [facility] and he states he is not interested at this time.  Will continue to follow while inpatient.  Thanks, Orlando Penner, RN, BSN, CCRN Diabetes Coordinator Inpatient Diabetes  Program [phone]

## 2012-05-19 NOTE — Plan of Care (Signed)
Problem: Food- and Nutrition-Related Knowledge Deficit (NB-1.1) Goal: Nutrition education Formal process to instruct or train a patient/client in a skill or to impart knowledge to help patients/clients voluntarily manage or modify food choices and eating behavior to maintain or improve health.  Outcome: Not Applicable Date Met:  05/19/12 Nutrition Education Note  RD consulted for nutrition education regarding CHF.  RD provided "Low Sodium Nutrition Therapy" handout from the Academy of [employer] and Dietetics. Reviewed patient's dietary recall. Provided examples on ways to decrease sodium intake in diet. Discouraged intake of processed foods and use of salt shaker. Encouraged fresh fruits and vegetables as well as whole grain sources of carbohydrates to maximize fiber intake.   RD discussed why it is important for patient to adhere to diet recommendations, and emphasized the role of fluids, foods to avoid, and importance of weighing self daily. Teach back method used.  States his usual intake is 1-1.5 packages of American Cheese per day, V8 juice, and [place] [employer]. States he has never had any diet education, but recently found out he should be following a low NA diet. Willing and ready to make changes. With many dietary preferences r/t IBS, which is why he was only eating those 3 food items. Have encouraged pt to try different foods, or find the lowest sodium variety of a given food if that is the only one he can tolerate.  Pt states he has had weight loss in the past, actively trying to gain weight. Encouraged Ensure/Boost supplements for increased kcal in place of higher sodium foods like V8.   Expect good compliance.  Body mass index is 28.41 kg/(m^2). Pt meets criteria for overweight based on current BMI.  Current diet order is heart, patient is consuming approximately 100% of meals at this time. Labs and medications reviewed. No further nutrition interventions warranted at this time. RD  contact information provided. If additional nutrition issues arise, please re-consult RD.   Clarene Duke RD, LDN Pager [phone] After Hours pager [phone]

## 2012-05-19 NOTE — H&P (Signed)
I M Attending  37 man with CHF and myasthenia gravis.  Adm for dyspnea and to regulate diuretic dosing.  This has varied greatly due to the severity of his CHF, his hypoproteinemia, and his use of several doctors in [place] and [place].  He has had wide swings in lasix dosing and has intermittently used metolazone as a "kicker".  Some of this has been prescribed at [facility].  He is focused currently on his new discovery of dietary sodium and is convinced that he has been eating all the wrong things.  He is planning to read labels and compute a 2000 mg a day diet to reduce the variables.  This would be useful. He has massive edema and a stable right pleural effusion on CXR.   BP is mildly increased.  Creatinine is normal but albumin is 2.5 (no proteinuria when last checked).  No evidence for acute heart disease.  Task is to find right dose of lasix +/- metolazone that achieves and preserves ideal weight.  Last year he weighed 210 so part of his task is complete.  Recently he has varied from 175 to 185;  his best weight is probably closer to the low end.  Please coordinate changes with Dr. Tonny Branch for follow-up.

## 2012-05-19 NOTE — Progress Notes (Signed)
Inpatient Diabetes Program Recommendations  AACE/ADA: New Consensus Statement on Inpatient Glycemic Control (2013)  Target Ranges:  Prepandial:   less than 140 mg/dL      Peak postprandial:   less than 180 mg/dL (1-2 hours)      Critically ill patients:  140 - 180 mg/dL   Results for HAZEN, BRUMETT (MRN [id number]) as of 05/19/2012 09:27  Ref. Range 05/18/2012 14:39 05/18/2012 20:52  Glucose-Capillary Latest Range: 70-99 mg/dL 409 (H) 811 (H)   Results for JORDEN, MINCHEY (MRN [id number]) as of 05/19/2012 09:27  Ref. Range 05/18/2012 03:47 05/19/2012 05:10  Glucose Latest Range: 70-99 mg/dL 213 (H) 086 (H)    Note: According to the patient's chart, patient refused Lantus last night and his fasting blood glucose this am was 361 mg/dl.  Please talk with the patient about refusing insulin.  Diabetes Coordinator will also talk with the patient today regarding diabetes management and compliance with medication regimen.  Will continue to follow.  Thanks, Orlando Penner, RN, BSN, CCRN Diabetes Coordinator Inpatient Diabetes Program [phone]

## 2012-05-19 NOTE — Progress Notes (Addendum)
Patient is refusing any insulin if CBG is 350 or less and is refusing metolazone, stating that "the pill will makes me dehydrated".  Lorretta Harp RN

## 2012-05-20 ENCOUNTER — Telehealth: Payer: Self-pay | Admitting: *Deleted

## 2012-05-20 ENCOUNTER — Encounter: Payer: Medicare Other | Admitting: Internal Medicine

## 2012-05-20 LAB — GLUCOSE, CAPILLARY
Glucose-Capillary: 250 mg/dL — ABNORMAL HIGH (ref 70–99)
Glucose-Capillary: 240 mg/dL — ABNORMAL HIGH (ref 70–99)

## 2012-05-20 LAB — BASIC METABOLIC PANEL
CO2: 28 mEq/L (ref 19–32)
GFR calc Af Amer: >90 mL/min (ref >90)
GFR calc non Af Amer: 85 mL/min — ABNORMAL LOW (ref >90)
Potassium: 4.3 mEq/L (ref 3.5–5.1)
Sodium: 138 mEq/L (ref 135–145)

## 2012-05-20 MED ORDER — OXYCODONE HCL 5 MG PO TABS: 5.0000 mg | ORAL_TABLET | Freq: Three times a day (TID) | ORAL | Status: DC | PRN

## 2012-05-20 MED ORDER — INSULIN GLARGINE 100 UNIT/ML ~~LOC~~ SOLN: 20.0000 [IU] | Freq: Every day | SUBCUTANEOUS | Status: DC

## 2012-05-20 MED ORDER — FUROSEMIDE 40 MG PO TABS: 160.0000 mg | ORAL_TABLET | Freq: Two times a day (BID) | ORAL | Status: DC

## 2012-05-20 NOTE — Discharge Summary (Signed)
Internal Medicine Teaching [facility] Discharge Note  Name: [name] MRN: [id number] DOB: [birth year] 66 y.o.  Date of Admission: 05/18/2012  4:01 PM Date of Discharge: 05/20/2012 Attending Physician: No att. providers found  Discharge Diagnosis: Principal Problem:  *CHF exacerbation Active Problems:  Diabetes mellitus type II, uncontrolled  HYPERTENSION  COPD, MILD  Chronic systolic heart failure  Discharge Medications:   Medication List     As of 05/20/2012  6:04 PM    TAKE these medications         benazepril 40 MG tablet   Commonly known as: LOTENSIN   Take 40 mg by mouth daily before breakfast.      furosemide 40 MG tablet   Commonly known as: LASIX   Take 4 tablets (160 mg total) by mouth 2 (two) times daily. Restarted together with Metalazone      insulin glargine 100 UNIT/ML injection   Commonly known as: LANTUS   Inject 20 Units into the skin at bedtime.      MESTINON 60 MG tablet   Generic drug: pyridostigmine   Take 1 tablet (60 mg total) by mouth 5 (five) times daily.      metolazone 5 MG tablet   Commonly known as: ZAROXOLYN   Take 1 tablet by mouth daily.      mycophenolate 500 MG tablet   Commonly known as: CELLCEPT   Take 1,000 mg by mouth 2 (two) times daily.      oxyCODONE 5 MG immediate release tablet   Commonly known as: Oxy IR/ROXICODONE   Take 1 tablet (5 mg total) by mouth every 8 (eight) hours as needed.      pantoprazole 40 MG tablet   Commonly known as: PROTONIX   Take 40 mg by mouth 2 (two) times daily.      potassium chloride SA 20 MEQ tablet   Commonly known as: K-DUR,KLOR-CON   Take 20 mEq by mouth 2 (two) times daily at 10 AM and 5 PM. With the lasix when he thinks about it      predniSONE 5 MG tablet   Commonly known as: DELTASONE   Take 15 mg by mouth daily.      zolpidem 10 MG tablet   Commonly known as: AMBIEN   Take 1 tablet (10 mg total) by mouth at bedtime.         Disposition and follow-up:    [name] was discharged from [facility] in stable condition.   At the hospital follow up visit please address  -Please evaluate the patient for his fluid overload, particularly his weight. -Repeat basic metabolic panel for evaluation of his potassium and creatinine -Please go over his medications for fluids, including Lasix, which was discharged at a dose of 160 mg twice daily in combination with metolazone 5 mg once daily. -Please continue to encourage the patient to restrict his water, and salt intake. -Please make any changes to his medications of treatment of type 2 diabetes. He was discharged on a dose of Lantus 20 units at bedtime. -Please consider doing a spot urine Protein/creatinine ratio. The patient was discharged for urine sample had been collected.  Follow-up Appointments: Follow-up Information    Follow up with Elfredia Nevins, MD. On 05/24/2012. (8:15am )    Contact information:   [address] [place] [address] [phone]       Follow up with PRIBULA,CHRISTOPHER, MD. On 06/06/2012. (10:45am )    Contact  information:   [address] [place] [address] [phone]         Discharge Orders    Future Appointments: Provider: Department: Dept Phone: Center:   05/24/2012 8:15 AM Elfredia Nevins, MD [facility] [phone] [facility]   06/06/2012 10:45 AM Leodis Sias, MD [place] [facility] [phone] [facility]     Future Orders Please Complete By Expires   Diet - low sodium heart healthy      Increase activity slowly      (HEART FAILURE PATIENTS) Call MD:  Anytime you have any of the following symptoms: 1) 3 pound weight gain in 24 hours or 5 pounds in 1 week 2) shortness of breath, with or without a dry hacking cough 3) swelling in the hands, feet or stomach 4) if you have to sleep on extra pillows at night in order to breathe.      Call MD for:  difficulty breathing,  headache or visual disturbances      Call MD for:  persistant dizziness or light-headedness         Consultations:    Procedures Performed:  Dg Chest 2 View  05/18/2012  *RADIOLOGY REPORT*  Clinical Data: Chest pain and shortness of breath.  CHEST - 2 VIEW  Comparison: 05/12/2012  Findings: Stable appearance of mediastinal postoperative changes. Mild cardiac enlargement with increased pulmonary vascularity suggesting mild congestion.  This is progressing since the previous study.  Small bilateral pleural effusions with basilar atelectasis are not significantly changed.  No pneumothorax.  Calcification of the aorta.  Degenerative changes in the spine.  IMPRESSION: Bilateral pleural effusions with basilar atelectasis appear unchanged.  Developing pulmonary vascular congestion.   Original Report Authenticated By: Burman Nieves, M.D.    Dg Chest 2 View  05/12/2012  *RADIOLOGY REPORT*  Clinical Data: Preop for endoscopy, history of myasthenia gravis, diabetes, and hypertension  CHEST - 2 VIEW  Comparison: Chest x-ray of 03/11/2012  Findings: The previous left pleural effusion has almost completely resolved.  There is still a right pleural effusion present with right basilar atelectasis.  The heart is within upper limits of normal.  Median sternotomy sutures are noted.  No bony abnormality is seen.  IMPRESSION:  1.  Little change in small right pleural effusion with right basilar atelectasis. 2.  Almost complete resolution of left pleural effusion.   Original Report Authenticated By: Dwyane Dee, M.D.    Admission HPI:  [name] is a 66 year old gentleman with past medical history significant for CHF, OSA, DM 2, and Myasthenia Gravis who presents to clinic today for reevaluation for a CHF exacerbation, for which the patient was first seen in the clinic on 05/16/2012. Patient was to be admitted at that time, however he stated that he could not come into the hospital at that time, and left AGAINST MEDICAL  ADVICE. The patient's symptoms persisted since that time, and he went to the ED yesterday evening for evaluation of the same issue. Chest x-ray performed immediately revealed worsening pulmonary congestion, and pro BNP was increasingly elevated. The patient again left AGAINST MEDICAL ADVICE after receiving a single dose of IV Lasix. At today's clinic visit, the patient continues to complain of shortness of breath and worsening leg swelling consistent with his previous complaints during this CHF exacerbation. The patient denies that his symptoms have either improved or worsened since previous evaluation. He denies any other symptoms. He denies chest pain or cough. He states that he is willing to  be admitted at this time, as he has come to the realization that this is she cannot be effectively managed with outpatient diuresis alone. No fever, or chills. He hesitantly accepts admission but for only one day and he threatens to leave AMA if kept longer.  He used to work with [employer] 30 years ago before he went into [employer] until 6 years ago when he retired. He takes care of his 60 year old mother and he would want to return home as soon as he over the hump. He is been married X 2 and divorced. He has one son who is 43 years. Used to smoke 35 pack years. He quit in 1993. He does not take alcohol.  ECHO 2011: The estimated ejection fraction was in the range of 55% to 60%. Wall motion was normal; there were no regional wall motion abnormalities. Features are consistent with a pseudonormal left ventricular filling pattern, with concomitant abnormal relaxation and increased filling pressure (grade 2 diastolic dysfunction). As reported by patient another echo 2013 at [facility] EF 45%. Review of Systems  Constitutional: Negative.  HENT: Negative.  Eyes: Negative.  Respiratory: Positive for shortness of breath. Negative for cough and wheezing.  Cardiovascular: Negative for chest pain and leg swelling.   Gastrointestinal: Negative for nausea, vomiting and diarrhea.  Genitourinary: Negative for dysuria and hematuria.  Musculoskeletal: Negative.  Skin: Negative.  Neurological: Negative.  Hematological: Negative.  Psychiatric/Behavioral: Negative.  Physical Exam:  Blood pressure 135/103, pulse 118, temperature 97.8 F (36.6 C), resp. rate 18, height 5\' 7"  (1.702 m), weight 181 lb 10.5 oz (82.4 kg), SpO2 97.00%.  Constitutional: He is oriented to person, place, and time. He appears well-developed and well-nourished. No distress.  HENT:  Head: Normocephalic and atraumatic.  Eyes: Pupils are equal, round, and reactive to light. No scleral icterus.  Neck: Normal range of motion. Neck supple. No tracheal deviation present. No thyromegaly present.  Cardiovascular: Normal rate and regular rhythm.  No murmur heard.  Pulmonary/Chest: Effort normal. He has no wheezes. He has no rales.  Abdominal: Soft. Bowel sounds are normal. He exhibits distension (mildly distended).  Musculoskeletal: He exhibits edema (3+ pitting pretibial edema bilaterally).  Neurological: He is alert and oriented to person, place, and time. No cranial nerve deficit.  Skin: Skin is warm and dry. No erythema.  Psychiatric: He has a normal mood and affect. His behavior is normal.   Hospital Course by problem list:  Acute on Chronic CHF: Danny Rzepka presented to the clinic with classical symptoms and signs of his CHF exacerbation. ECHO in 2011 showed EF of 55% to 60% with normal wall motion. He reports an echocardiogram that was done in [facility] about 3 months ago, which revealed an EF of about 42-45%. Records from his echo in Epic reveals a pseudonormal left ventricular filling pattern, with concomitant abnormal relaxation and increased filling pressure (grade 2 diastolic dysfunction). His chest x-ray revealed pulmonary congestion, secondary to fluid overload. He was not responding appropriately to the outpatient oral Lasix. He takes 160 mg  twice daily of Lasix. He has probably gained about 7 pounds over the last couple of weeks. He also admitted to a history of consuming lots of salt. He is proBNP had increased from 6000 to 7000 within 2 days. His basic metabolic panel, including potassium, and creatinine were within normal limits. However, serum glucose was elevated in the ranges of 250-300 for most of his hospitalization duration.Marland Kitchen He was admitted for short-term fluid management with IV diuresis  until he was able to be discharged on an oral regimen of Lasix. He complained of shortness of breath, but without history of headache, chest pain, cough, chills, or fevers. Did not have sputum production. Therefore, we did not suspect pneumonia, PE, or cardiac arrhythmias to explain his acute CHF exacerbation. During his hospitalization for 2 days, he lost 3 pounds. I believe his normal weight is around 175 pounds. His discharge weight was 179. He received extensive counseling regarding restriction of his salt intake, reducing of his fluid intake to 2 L in a day, and the importance of daily weights every morning with keeping of a log of his weights to present to the clinic on his next visit. The patient seemed to appreciate these recommendations and was motivated to try all means to control fluid overload. He was discharged on an oral regimen of oral resonance 160 mg twice daily, and metolazone 5 mg once daily to augment his diuresis. He will followup in the outpatient clinic within a few days.    Type 2 diabetes: His home regimen for treatment of type 2 diabetes include Lantus 18 units at bedtime. Last HbA1c was 9.9% in October 2013. On his request, he was discharged on Lantus does of 20 international units at bedtime. This issue will continue to be addressed on outpatient basis.  Myasthenia gravis: Patient has 3 years history of myasthenia gravis being treated with Pyridostigmine. While in the hospital, he will continue with his current dose of 60 mg 5  times daily. He follows closely with his neurologist for this condition.  CAD: This is the most likely cause of his congestive heart failur and in a was e. However, the patient is not on optimal therapy due to intolerance of beta blockers secondary to myasthenia gravis. He is on benazepril and oral furosemide.   Discharge Vitals:  BP 148/93  Pulse 97  Temp 97.7 F (36.5 C) (Oral)  Resp 22  Ht 5\' 7"  (1.702 m)  Wt 179 lb 0.2 oz (81.2 kg)  BMI 28.04 kg/m2  SpO2 93%  Discharge Labs:  Results for orders placed during the hospital encounter of 05/18/12 (from the past 24 hour(s))  GLUCOSE, CAPILLARY     Status: Abnormal   Collection Time   05/19/12  8:51 PM      Component Value Range   Glucose-Capillary 384 (*) 70 - 99 mg/dL  BASIC METABOLIC PANEL     Status: Abnormal   Collection Time   05/20/12  5:55 AM      Component Value Range   Sodium 138  135 - 145 mEq/L   Potassium 4.3  3.5 - 5.1 mEq/L   Chloride 100  96 - 112 mEq/L   CO2 28  19 - 32 mEq/L   Glucose, Bld 247 (*) 70 - 99 mg/dL   BUN 42 (*) 6 - 23 mg/dL   Creatinine, Ser 1.61  0.50 - 1.35 mg/dL   Calcium 9.5  8.4 - 09.6 mg/dL   GFR calc non Af Amer 85 (*) >90 mL/min   GFR calc Af Amer >90  >90 mL/min  GLUCOSE, CAPILLARY     Status: Abnormal   Collection Time   05/20/12  6:17 AM      Component Value Range   Glucose-Capillary 250 (*) 70 - 99 mg/dL  GLUCOSE, CAPILLARY     Status: Abnormal   Collection Time   05/20/12 11:11 AM      Component Value Range   Glucose-Capillary 240 (*) 70 -  99 mg/dL   Comment 1 Notify RN      Signed: Dow Adolph 05/20/2012, 6:04 PM   Time Spent on Discharge: 45 minutes

## 2012-05-20 NOTE — Progress Notes (Signed)
Pt had questionable 4 beats of vtach. While patient is ambulating to the bathroom. Pt asymptomatic. No c/o chest pain nor sob. VS stable. Will continue to monitor patient

## 2012-05-20 NOTE — Progress Notes (Signed)
Subjective: The patient feels even better today. He has lost a total of 3 lb over the last 2 days.  Objective: Vital signs in last 24 hours: Filed Vitals:   05/19/12 1431 05/19/12 1655 05/19/12 2045 05/20/12 0505  BP: 169/88 156/99 144/81 148/93  Pulse: 89 91 81 97  Temp: 98 F (36.7 C)  98.2 F (36.8 C) 97.7 F (36.5 C)  TempSrc: Oral  Oral Oral  Resp: 20 20 20 22   Height:      Weight:    179 lb 0.2 oz (81.2 kg)  SpO2: 96% 98% 97% 93%   Weight change: -2 lb 10.3 oz (-1.2 kg)  Intake/Output Summary (Last 24 hours) at 05/20/12 1319 Last data filed at 05/20/12 1000  Gross per 24 hour  Intake    967 ml  Output   1500 ml  Net   -533 ml   Physical Examination: Constitutional: He is oriented to person, place, and time. He appears well-developed and well-nourished. No distress.  HENT:  Head: Normocephalic and atraumatic.  Eyes: Pupils are equal, round, and reactive to light. No scleral icterus.  Neck: Normal range of motion. Neck supple. No tracheal deviation present. No thyromegaly present.  Cardiovascular: Normal rate and regular rhythm.  No murmur heard.  Pulmonary/Chest: Effort normal. He has no wheezes. He has no rales.  Abdominal: Soft. Bowel sounds are normal. He exhibits distension (mildly distended).  Musculoskeletal: He exhibits edema (3+ pitting pretibial edema bilaterally).  Neurological: He is alert and oriented to person, place, and time. No cranial nerve deficit.  Skin: Skin is warm and dry. No erythema.  Psychiatric: He has a normal mood and affect. His behavior is normal.   Lab Results: Basic Metabolic Panel:  Lab 05/20/12 9147 05/19/12 0510  NA 138 140  K 4.3 4.1  CL 100 101  CO2 28 28  GLUCOSE 247* 361*  BUN 42* 43*  CREATININE 0.90 0.78  CALCIUM 9.5 9.6  MG -- 1.4*  PHOS -- --   Liver Function Tests:  Lab 05/19/12 0510  AST 16  ALT 9  ALKPHOS 110  BILITOT 1.1  PROT 6.1  ALBUMIN 2.5*   No results found for this basename:  LIPASE:2,AMYLASE:2 in the last 168 hours No results found for this basename: AMMONIA:2 in the last 168 hours CBC:  Lab 05/19/12 0510 05/18/12 0347  WBC 6.8 11.0*  NEUTROABS 5.6 8.7*  HGB 12.9* 13.8  HCT 39.1 38.8*  MCV 79.8 78.5  PLT 258 260   Cardiac Enzymes:  Lab 05/18/12 0347  CKTOTAL --  CKMB --  CKMBINDEX --  TROPONINI <0.30   BNP:  Lab 05/19/12 0510 05/18/12 0345 05/16/12 1417  PROBNP 8082.0* 7283.0* 6190.0*   D-Dimer: No results found for this basename: DDIMER:2 in the last 168 hours CBG:  Lab 05/20/12 0617 05/19/12 2051 05/19/12 1057 05/19/12 0620 05/18/12 2052 05/18/12 1439  GLUCAP 250* 384* 284* 318* 149* 149*   Hemoglobin A1C: No results found for this basename: HGBA1C in the last 168 hours Fasting Lipid Panel: No results found for this basename: CHOL,HDL,LDLCALC,TRIG,CHOLHDL,LDLDIRECT in the last 829 hours Thyroid Function Tests:  Lab 05/19/12 0510  TSH 1.351  T4TOTAL --  FREET4 --  T3FREE --  THYROIDAB --   Coagulation:  Lab 05/19/12 0510  LABPROT 12.8  INR 0.97    Studies/Results: No results found. Medications: I have reviewed the patient's current medications. Scheduled Meds:    . enoxaparin (LOVENOX) injection  40 mg Subcutaneous Q24H  . furosemide  120 mg Intravenous TID  . insulin aspart  0-9 Units Subcutaneous TID WC  . insulin glargine  14 Units Subcutaneous QHS  . oxyCODONE  5 mg Oral Q6H  . pantoprazole  40 mg Oral BID  . potassium chloride SA  20 mEq Oral BID  . predniSONE  15 mg Oral Daily  . pyridostigmine  60 mg Oral 5 X Daily  . sodium chloride  3 mL Intravenous Q12H   Continuous Infusions:  PRN Meds:.zolpidem Assessment/Plan: Mr Tilghman is a 66 year old man with past medical history significant for CHF, OSA, DM 2, and Myasthenia Gravis who presents for inpatient diuresis for his CHF exacerbation,   Acute on Chronic CHF: Mr Bogacz presented to the clinic with classical symptoms and signs of his CHF exacerbation. ECHO in  2011 showed EF of 55% to 60% with normal wall motion. He reports an echocardiogram that was done in [facility] about 3 months ago, which revealed an EF of about 42-45%. Records from his echo in Epic reveals a pseudonormal left ventricular filling pattern, with concomitant abnormal relaxation and increased filling pressure (grade 2 diastolic dysfunction). His chest x-ray revealed pulmonary congestion, secondary to fluid overload. He was not responding appropriately to the outpatient oral Lasix. He takes 160 mg twice daily of Lasix. He has probably gained about 7 pounds over the last couple of weeks. He also admitted to a history of consuming lots of salt. He is proBNP had increased from 6000 to 7000 within 2 days. His basic metabolic panel, including potassium, and creatinine were within normal limits. However, serum glucose was elevated in the ranges of 250-300 for most of his hospitalization duration.Marland Kitchen He was admitted for short-term fluid management with IV diuresis until he was able to be discharged on an oral regimen of Lasix. He complained of shortness of breath, but without history of headache, chest pain, cough, chills, or fevers. Did not have sputum production. Therefore, we did not suspect pneumonia, PE, or cardiac arrhythmias to explain his acute CHF exacerbation. During his hospitalization for 2 days, he lost 3 pounds. I believe his normal weight is around 175 pounds. His discharge weight was 179. He received extensive counseling regarding restriction of his salt intake, reducing of his fluid intake to 2 L in a day, and the importance of daily weights every morning with keeping of a log of his weights to present to the clinic on his next visit. The patient seemed to appreciate these recommendations and was motivated to try all means to control fluid overload. He was discharged on an oral regimen of oral resonance 160 mg twice daily, and metolazone 5 mg once daily to augment his diuresis. He will followup in the  outpatient clinic within a few days.  Type 2 diabetes: His home regimen for treatment of type 2 diabetes include Lantus 18 units at bedtime. Last HbA1c was 9.9% in October 2013. On his request, he was discharged on Lantus does of 20 international units at bedtime. This issue will continue to be addressed on outpatient basis. Myasthenia gravis: Patient has 3 years history of myasthenia gravis being treated with Pyridostigmine. While in the hospital, he will continue with his current dose of 60 mg 5 times daily. He follows closely with his neurologist for this condition.   CAD: This is the most likely cause of his congestive heart failur and in a was e. However, the patient is not on optimal therapy due to intolerance of beta blockers secondary to myasthenia gravis. He is on  benazepril and oral furosemide.  Dispo: Disposition is deferred at this time, awaiting improvement of current medical problems. Anticipated discharge in approximately 1 day(s). The patient has habitually left the hospitals and the ED  AMA. He wants to stay for one more day.  The patient does have a current PCP (PRIBULA,CHRISTOPHER, MD), therefore will be requiring [facility]PC follow-up after discharge.  The patient does not have transportation limitations that hinder transportation to clinic appointments.    LOS: 2 days   Dow Adolph 05/20/2012, 1:19 PM

## 2012-05-20 NOTE — Progress Notes (Signed)
UR Completed.  [name] Jane [phone] 05/20/2012  

## 2012-05-20 NOTE — Progress Notes (Signed)
IM Attending  Better.  Cheerful.  Full of plans the correct diet and adhere to medical regime.  He is to weigh himself each morning and adjust lasix to achieve and preserve weight at or just below 175.  Follow-up with Dr. Tonny Branch.

## 2012-05-20 NOTE — Telephone Encounter (Signed)
Danny [name] was disch from hospital today, he calls questioning his care, he refers to [name] as that "little african kid" and states he questions his abilities because he wrote a script for lasix and he already had refills at the pharmacy, if he would in his judgement not order an echo for pt today and he sent a new script for lasix to the pharmacy "how good is his judgement, i don't think he is up to speed", i explained that these are 2 different matters and the pharmacy will put the script on hold, as long as he has proper directions for dosage then there truly is no problem, as far as echo goes it may be too son as [name] stated to him that indeed he needs to wait, at this point i put him on hold and spoke w/ Danny Tonny Branch, he agreed that pt needs to wait to have a new echo until he is stabilized and the echo will be helpful at that time at the moment it would show what the physicians know, that he is not at a stable point and in a few weeks he should be at baseline and an echo at that point will be helpful. [name] continued to question the competency of [name] and suggested that he speak w/ the attending Tuesday when he sees Danny Lavena Bullion or when he sees Danny Tonny Branch on 12/30. At last he agreed to end the call

## 2012-05-21 NOTE — Telephone Encounter (Signed)
I agree with Dr. Huntley Dec judgement that an Echo at this point for [name] would not be helpful.  He was very obviously in a CHF exacerbation and that his EF would be depressed.  It would be more helpful to get him stabilized for a few weeks and then recheck his echo to see what his baseline EF is.    As for his behavior he has been warned several times and we need to continue to document them and we may need to discuss this with clinic leadership and come up with a plan to try to ensure that the patient/physician/staff relationship continues to be therapeutic.

## 2012-05-23 NOTE — Discharge Summary (Signed)
I agree with the resident notes. 

## 2012-05-24 ENCOUNTER — Encounter: Payer: Self-pay | Admitting: Radiation Oncology

## 2012-05-24 ENCOUNTER — Ambulatory Visit (INDEPENDENT_AMBULATORY_CARE_PROVIDER_SITE_OTHER): Payer: Medicare Other | Admitting: Radiation Oncology

## 2012-05-24 VITALS — BP 159/84 | HR 76 | Temp 97.6°F | Wt 181.6 lb

## 2012-05-24 DIAGNOSIS — I5022 Chronic systolic (congestive) heart failure: Secondary | ICD-10-CM

## 2012-05-24 DIAGNOSIS — I10 Essential (primary) hypertension: Secondary | ICD-10-CM

## 2012-05-24 DIAGNOSIS — I509 Heart failure, unspecified: Secondary | ICD-10-CM

## 2012-05-24 DIAGNOSIS — E1165 Type 2 diabetes mellitus with hyperglycemia: Secondary | ICD-10-CM

## 2012-05-24 LAB — BASIC METABOLIC PANEL
BUN: 54 mg/dL — ABNORMAL HIGH (ref 6–23)
Calcium: 9.5 mg/dL (ref 8.4–10.5)
Chloride: 92 mEq/L — ABNORMAL LOW (ref 96–112)
Creat: 0.92 mg/dL (ref 0.50–1.35)

## 2012-05-24 MED ORDER — ISOSORB DINITRATE-HYDRALAZINE 20-37.5 MG PO TABS: 1.0000 | ORAL_TABLET | Freq: Three times a day (TID) | ORAL | Status: DC

## 2012-05-24 NOTE — Progress Notes (Addendum)
  Subjective:    Patient ID: [name], male    DOB: 1945-08-06, 66 y.o.   MRN: [id number]  HPI Patient is a 66 year old man with past medical history chronic systolic heart failure, OSA, myasthenia gravis, (for comprehensive PMH, see "Problem List")who presents to clinic for hospital followup. The patient was discharged on 05/20/2012 after having been admitted for a CHF exacerbation. While in hospital the patient was diuresed approximately 5 pounds, with marked improvement in his subjective symptoms. The patient states that he feels well today, it is less swelling and shortness of breath continued to improve. States has been weighing himself daily, and provides a record of his daily weights, which revealed that his weight has been steadily decreasing from 178 pounds to 174 pounds since discharge. He states he's been trying to be compliant with a low-salt diet and to limit his fluid intake, and has been fairly successful thus far. Patient states he has not been taking his insulin since discharge, since he states he feels better without it. He states he is fully compliant with all his other medications, including taking his Lasix at 160 mg twice a day and his metolazone 5 mg once a day. The patient states that his urinary output is approximately at baseline.   Patient has no other complaints today, and states that his thinking is much improved and that he is much more lucid since being diuresed over the last week. He also states that his walking is much improved, coinciding with the decrease in his leg swelling.    Review of Systems  Constitutional: Negative for fever and chills.  HENT: Negative.   Eyes: Negative.   Respiratory: Negative for shortness of breath and wheezing.   Cardiovascular: Positive for leg swelling. Negative for chest pain.  Gastrointestinal: Negative.   Genitourinary: Negative.   Musculoskeletal: Negative.   Skin: Negative.   Neurological: Negative.   Hematological:  Negative.   Psychiatric/Behavioral: Negative.        Objective:   Physical Exam  Constitutional: He is oriented to person, place, and time. He appears well-developed and well-nourished.  HENT:  Head: Normocephalic and atraumatic.  Mouth/Throat: No oropharyngeal exudate.  Eyes: Pupils are equal, round, and reactive to light. No scleral icterus.  Neck: Normal range of motion. No tracheal deviation present.  Cardiovascular: Normal rate and regular rhythm.   No murmur heard. Pulmonary/Chest: Effort normal. He has no wheezes. He has no rales.  Abdominal: Soft. Bowel sounds are normal. There is no tenderness. There is no rebound.  Musculoskeletal: Normal range of motion. He exhibits edema (3+ pitting pretibial edema bilaterally). He exhibits no tenderness.  Neurological: He is alert and oriented to person, place, and time. No cranial nerve deficit.  Skin: Skin is warm and dry.  Psychiatric: He has a normal mood and affect. His behavior is normal.          Assessment & Plan:

## 2012-05-24 NOTE — Assessment & Plan Note (Addendum)
Blood glucose today is ~500, which is consistent with patient's noncompliance with his insulin therapy. The patient was instructed that he needs to take his insulin as prescribed, however he has a long history of not taking medications but he does not believe to be beneficial to him at any given point in time, so unclear how effective this advice will be. -Continue Lantus 20U QHS - continue to stress the importance of compliance to patient

## 2012-05-24 NOTE — Assessment & Plan Note (Addendum)
Patient remains mildly hypertensive despite diuresis. Given the patient's hypertension seems to be recalcitrant to his current medication regimen, additional medical therapy is warranted at this time. -Continue Lasix 160 mg twice a day -continue metolazone 5mg  QD -Continue benazepril 40 mg a day -Isosorbide-hydralazine 20-37.5 mg 3 times a day -Followup with PCP, Dr. Tonny Branch, in 2 weeks (already has appt on 12/30)  BP Readings from Last 3 Encounters:  05/24/12 159/84  05/20/12 148/93  05/18/12 152/70

## 2012-05-24 NOTE — Assessment & Plan Note (Addendum)
Patient's weight is slightly increased today in clinic, however patient provides evidence of what appears to be reliable weights taken at home, and shows a steady trend of approximately 1 pound of weight loss per day since discharge 4 days ago (~178->~174). As he states his symptoms continue to improve, it appears probable that the patient does indeed continue to effectively diurese. No changes were made in his diuretics or other medications today. As he has a recent history of mild dehydration secondary to high dose Lasix, a BMP will be drawn today, however the patient does have an elevated BUN as he had previously, his Lasix dose would have to very cautiously be decreased if at all, due to his recent CHF exacerbation secondary to his Lasix dose being decreased. -BMP -Followup with [facility] cardiology - F/u in clinic in 2 weeks  Wt Readings from Last 3 Encounters:  05/24/12 181 lb 9.6 oz (82.373 kg)  05/20/12 179 lb 0.2 oz (81.2 kg)  05/18/12 184 lb (83.462 kg)   Update: BMET revealed BUN slightly increased over previous on 12/13, however the benefits of continued diuresis at patient's current lasix dose would seem to outweigh the risks of decreasing the dose and possibly precipitating another CHF exacerbation. Patient is mildly hyponatremic, which is likely secondary to patient's extensive diuretic regimen in the setting of his new emphasis on a low salt diet. Will contact the patient by telephone today, and inform him of his lab results. -Continue Lasix 160 mg twice a day, metolazone 5 mg daily   BMET    Component Value Date/Time   NA 130* 05/24/2012 0919   K 4.6 05/24/2012 0919   CL 92* 05/24/2012 0919   CO2 28 05/24/2012 0919   GLUCOSE 501* 05/24/2012 0919   BUN 54* 05/24/2012 0919   CREATININE 0.92 05/24/2012 0919   CREATININE 0.90 05/20/2012 0555   CALCIUM 9.5 05/24/2012 0919   GFRNONAA 85* 05/20/2012 0555   GFRAA >90 05/20/2012 0555

## 2012-05-24 NOTE — Patient Instructions (Addendum)
Instructions: You will be called regarding the results of your lab work. If any changes in your medications are needed, these will be discussed at that time. No changes were made to your medications during today's visit, and you should continue to take them as prescribed until otherwise instructed. Have a great day--we'll likely have you return in 3 months unless your lab work is abnormal.   Sodium and Fluid Restriction Some health conditions may require you to restrict your sodium and fluid intake. Sodium is part of the salt in the blood. Sodium may be restricted because when you take in a lot of salt, you become thirsty. Limiting salt with help you become less thirsty and may make it easier to restrict fluid. Talk to your caregiver or dietician about how many cups of fluid and how many milligrams of sodium you are allowed each day. If your caregiver has restricted your sodium and fluids, usually the amount you can drink depends on several things, such as:  Your urine output.  How much fluid you are retaining.  Your blood pressure. Every 2 cups (500 mL) of fluid retained in the body becomes an extra 1 pound (0.5 kg) of body weight. The following are examples of some fluids you will have to restrict:  Tea, coffee, soda, lemonade, milk, water, and juice.  Alcoholic beverages.  Cream.  Gravy.  Ice cubes.  Soup and broth. The following are foods that become liquid at room temperature. These foods will count towards your fluid intake.  Ice cream and ice milk.  Frozen yogurt and sherbet.  Frozen ice pops.  Flavored gelatin. YOU MAY BE TAKING IN TOO MUCH FLUID IF:  Your weight increases.  Your face, hands, legs, feet, and abdomen start to swell.  You have trouble breathing. HOME CARE INSTRUCTIONS If you follow a low sodium diet closely, you will eat approximately 1,500 mg of sodium a day.   Avoid salty foods. This increases your thirst and makes fluid control more difficult.  Foods high in sodium include:  Most canned foods, including most meats.  Most processed foods, including most meats.  Cheese.  Dried pasta and rice mixes.  Snack foods (chips, popcorn, pretzels, cheese puffs, salted nuts).  Dips, sauces, and salad dressings.  Do not use salt in cooking or add salt to your meal. Adriana Simas with herbs and spices, but not those that have salt in the name. Ask your caregiver if it is okay to use salt substitutes.  Eat home-prepared meals. Use fresh ingredients. Avoid canned, frozen, or packaged meals.  Read food labels to see how much sodium is in the food. Know how much sodium you are allowed each day.  When eating out, ask for dressings and sauces on the side.  Weigh yourself every morning with an empty bladder before you eat or drink. If your weight is going up, you are retaining fluid.  Freeze fruit juice or water in an ice cube tray. Use this as part of your fluid allowance.  Brush your teeth often or rinse your mouth with mouthwash to help your dry mouth. Lemon wedges, hard sour candies, chewing gum, or breath spray may help to moisten your mouth too.  Add a slice of fresh lemon or lemon juice to water or ice. This helps satisfy your thirst.  Try frozen fruits between meals, such as grapes or strawberries.  Swallow your pills along with meals or soft foods. This helps you save your fluid for something you enjoy.  Use small  cups and glasses and learn to sip fluids slowly.  Keep your home cooler. Keep the air in your home as humid as possible. Dry air increases thirst.  Avoid being out in the hot sun. Each morning, fill a jug with the amount of water you are allowed for the day. You can use this water as a guideline for fluid allowance. Each time you take in fluid, pour an equal amount of water out of the container. This helps you to see how much fluid you are taking in. It also helps plan your fluid intake for the rest of the day. CONVERSIONS TO  HELP MEASURE FLUID INTAKE  1 cup equals 8 oz (240 mL).   cup equals 6 oz (180 mL).   cup equals 5  oz (160 mL).   cup equals 4 oz (120 mL).   cup equals 2  oz (80 mL).   cup equals 2 oz (60 mL).  2 tbs equals 1 oz (30 mL). Document Released: 03/22/2007 Document Revised: 11/24/2011 Document Reviewed: 11/06/2011 [facility] Patient Information 2013 [place], [place].

## 2012-05-28 ENCOUNTER — Telehealth: Payer: Self-pay | Admitting: Internal Medicine

## 2012-05-28 NOTE — Telephone Encounter (Signed)
  INTERNAL MEDICINE RESIDENCY PROGRAM After-Hours Telephone Call    Reason for call:   I received a call from [name] at 6:20 PM indicating lightheadedness.    Pertinent Data:   The patient is a 66 yo M, history of CHF, with a recent admission for volume overload.  The patient notes that for the last few days he has been losing a lot of weight, and today developed mild lightheadedness.  His weights recorded at home are as follows:  12/15: 177 lbs  12/16: 176 lbs  12/17: 174 lbs  (note: 181 lbs when measured in our clinic on this day)  12/18: 172 lbs  12/19: 168 lbs  12/20: 168 lbs  12/21: 164 lbs  The patient notes that his LE edema has significantly decreased, and he is now able to walk more easily, about which he is very happy.  He notes mild lightheadedness which "comes and goes", but does not seem to particularly occur when standing from a seated position.  He has not felt palpitations or any noticeable increase in his HR.  No presyncope.  He has been taking Lasix 160 mg BID.  He has not been taking Metolazone.  He asks for advice.    Assessment / Plan / Recommendations:   The patient notes a reported 13 lb weight loss in 1 week on his home scale.  He has taking his Lasix dosage this morning, but has not taking his evening dosage.  I advise the patient to skip his evening dose for the next 3 nights (including tonight), then call us back to report his weight change.  At that point we can adjust his lasix accordingly.   The patient has an appointment with Dr. Tonny Ward on 12/30.  The patient is also instructed to take caution when rising from a seated or supine position, in case of development of lightheadedness.  As always, pt is advised that if symptoms worsen or new symptoms arise, they should go to an urgent care facility or to to ER for further evaluation.    [name], MD   05/28/2012, 6:25 PM

## 2012-05-29 ENCOUNTER — Telehealth: Payer: Self-pay | Admitting: Internal Medicine

## 2012-05-29 DIAGNOSIS — I5020 Unspecified systolic (congestive) heart failure: Secondary | ICD-10-CM

## 2012-05-29 NOTE — Telephone Encounter (Signed)
  INTERNAL MEDICINE RESIDENCY PROGRAM After-Hours Telephone Call    Reason for call:   I received a call from [name] at around 5 PM because of concern of his lasix dose.  He spoke with our on call team on 05/28/12 as well.    Pertinent Data:   Mr. Beer is a 66 yo man with history of CHF (he reports his most recent EF to be around 42-45% which was done at [facility]; on our records, he had an echo in 2011 revealing an EF of 55-60% with Grade 2 diastolic dysfunction).  He also has history of DM2, CAD & myasthenia gravis.    He spoke with Dr. Manson Passey on 05/28/12 and had complaints of lightheaded ness and was subsequently told to hold his next three evening doses of lasix.    He reviewed his weights with me, which were the same as what is noted in Dr. Theora Gianotti note from 05/28/12  He reports that since holding his Lasix yesterday (as well as his metolazone), he notes a mild increase in SOB as well as increased LE edema.  The increased edema limits the use of his legs, which he was using well during diuresis.  He reports that he also experienced a hand cramp today, but not significant muscle cramping.  He does not feel dizzy upon standing, but reports an occasional fleeting dizziness.  He does differentiate his symptoms from when he was recently hospitalized, and notes that he does not feel as bad as he felt when he was recently hospitalized on 05/18/12  He denies chest pain and heart palpitations    Assessment / Plan / Recommendations:   In setting of increased SOB & LE edema, asked him to take today's dose of Lasix 160mg   He should come to clinic tomorrow, 05/30/12 for blood draw, and I will follow up these labs (BMET & pBNP)  He asked for [place] to refill his metolazone, but I suggested he follow up with his cardiologist that is following him for this - he is going to call [facility] tomorrow to find out when his appt is  As always, pt is advised that if symptoms worsen or new symptoms arise,  they should go to an urgent care facility or to to ER for further evaluation.    Belia Heman, MD   05/29/2012, 5:31 PM

## 2012-05-30 ENCOUNTER — Other Ambulatory Visit (INDEPENDENT_AMBULATORY_CARE_PROVIDER_SITE_OTHER): Payer: Medicare Other

## 2012-05-30 DIAGNOSIS — I5020 Unspecified systolic (congestive) heart failure: Secondary | ICD-10-CM

## 2012-05-30 DIAGNOSIS — I509 Heart failure, unspecified: Secondary | ICD-10-CM

## 2012-05-30 LAB — BASIC METABOLIC PANEL
BUN: 63 mg/dL — ABNORMAL HIGH (ref 6–23)
Chloride: 94 mEq/L — ABNORMAL LOW (ref 96–112)
Glucose, Bld: 213 mg/dL — ABNORMAL HIGH (ref 70–99)
Potassium: 4.1 mEq/L (ref 3.5–5.3)
Sodium: 135 mEq/L (ref 135–145)

## 2012-05-30 LAB — PRO B NATRIURETIC PEPTIDE: Pro B Natriuretic peptide (BNP): 3875 pg/mL — ABNORMAL HIGH (ref <126)

## 2012-06-06 ENCOUNTER — Encounter: Payer: Self-pay | Admitting: Internal Medicine

## 2012-06-06 ENCOUNTER — Ambulatory Visit (INDEPENDENT_AMBULATORY_CARE_PROVIDER_SITE_OTHER): Payer: Medicare Other | Admitting: Internal Medicine

## 2012-06-06 VITALS — BP 165/82 | HR 53 | Temp 97.2°F | Ht 67.0 in | Wt 173.4 lb

## 2012-06-06 DIAGNOSIS — M5412 Radiculopathy, cervical region: Secondary | ICD-10-CM

## 2012-06-06 DIAGNOSIS — I10 Essential (primary) hypertension: Secondary | ICD-10-CM

## 2012-06-06 DIAGNOSIS — E1165 Type 2 diabetes mellitus with hyperglycemia: Secondary | ICD-10-CM

## 2012-06-06 DIAGNOSIS — I5022 Chronic systolic (congestive) heart failure: Secondary | ICD-10-CM

## 2012-06-06 DIAGNOSIS — G4700 Insomnia, unspecified: Secondary | ICD-10-CM

## 2012-06-06 LAB — GLUCOSE, CAPILLARY: Glucose-Capillary: 263 mg/dL — ABNORMAL HIGH (ref 70–99)

## 2012-06-06 MED ORDER — OXYCODONE HCL 5 MG PO TABS: 5.0000 mg | ORAL_TABLET | Freq: Three times a day (TID) | ORAL | Status: DC | PRN

## 2012-06-06 NOTE — Patient Instructions (Addendum)
1.  Continue with the low salt diet.    2.  Continue your medication as prescribed.  3.  Follow up with me in 2 months.  4.  For the Labs you can come here to our clinic or [address] [place] has a Diplomatic Services operational officer labs on the first floor where they can draw it.

## 2012-06-06 NOTE — Progress Notes (Signed)
Subjective:   Patient ID: FENTON CANDEE male   DOB: 1946-04-16 66 y.o.   MRN: [id number]  HPI: Mr.Jovaun Reece Agar Mclouth is a 66 y.o. man who presents to clinic today for follow up on his chronic medical problems including hypertension, insomnia, diabetes, and Diastolic heart failure.  He is also in need of a refill of his pain medication for his neck, and hip.  See Problem focused Assessment and Plan for full details of his chronic medical conditions.   He has some confusion today about his medications.  He states that he has been having problems sleeping at night and has been sleeping at his desk sitting up.  He gets about 4 hours of sleep at night when taking the 10 mg of Ambien and then states he will use another 10 mg of ambien during the day to take a long nap.  He has run out of his supply of ambien.  He also has still not started his Cellcept for his Mysthenia gravis.    Past Medical History  Diagnosis Date  . Sinus tachycardia 2006    a. Nonobstructive CAD by cath 2013. b. s/p atrial tachycardia ablation 08/2011 (sinus node modification)  . Orthostatic hypotension   . Unspecified essential hypertension   . Hyperlipidemia   . Cervical spondylosis with radiculopathy   . Syrinx     T5-T6  . Cord compression     Cord compression syndrome C5-C6, C6-C7  . ADD (attention deficit disorder)   . Decreased libido   . Depression   . DM (diabetes mellitus)     adult onset  . Pulmonary nodule, left     Evaluated 04/2011 by pulmonology - felt to be benign granuloma  . Peripheral edema   . 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 LV dysfunction     By echo 2013 at [facility] - reported EF 45% per pt  . Colitis     with surface exudate  . Hemorrhoids     small  . Pseudomembranous colitis   . Gastritis   . Clostridium difficile infection 02/15/2012  . Shortness of breath   . CHF (congestive heart failure)   . Right bundle branch block and left posterior fascicular  block   . Sleep apnea     occassionally uses cpap  . 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 benazepril (LOTENSIN) 40 MG tablet Take 40 mg by mouth daily before breakfast.      . furosemide (LASIX) 40 MG tablet Take 4 tablets (160 mg total) by mouth 2 (two) times daily. Restarted together with Metalazone  90 tablet  2  . insulin glargine (LANTUS) 100 UNIT/ML injection Inject 20 Units into the skin at bedtime.  10 mL  0  . isosorbide-hydrALAZINE (BIDIL) 20-37.5 MG per tablet Take 1 tablet by mouth 3 (three) times daily.  90 tablet  1  . metolazone (ZAROXOLYN) 5 MG tablet Take 1 tablet by mouth daily.       . mycophenolate (CELLCEPT) 500 MG tablet Take 1,000 mg by mouth 2 (two) times daily.      Marland Kitchen oxyCODONE (OXY IR/ROXICODONE) 5 MG immediate release tablet Take 1 tablet (5 mg total) by mouth every 8 (eight) hours as needed.  90 tablet  0  . pantoprazole (PROTONIX) 40 MG tablet Take 40 mg by mouth 2 (  two) times daily.       . potassium chloride SA (K-DUR,KLOR-CON) 20 MEQ tablet Take 20 mEq by mouth 2 (two) times daily at 10 AM and 5 PM. With the lasix when he thinks about it      . predniSONE (DELTASONE) 5 MG tablet Take 15 mg by mouth daily.      Marland Kitchen pyridostigmine (MESTINON) 60 MG tablet Take 1 tablet (60 mg total) by mouth 5 (five) times daily.      Marland Kitchen zolpidem (AMBIEN) 10 MG tablet Take 1 tablet (10 mg total) by mouth at bedtime.  30 tablet  5  . [DISCONTINUED] pantoprazole (PROTONIX) 40 MG tablet Take 1 tablet (40 mg total) by mouth daily.  30 tablet  2   Family History  Problem Relation Age of Onset  . Breast cancer Mother   . Other Father     Beck's Sarcoid  . Colon cancer Neg Hx   . Prostate cancer Neg Hx    History   Social History  . Marital Status: Single    Spouse Name: N/A    Number of Children: 1  . Years of Education: N/A   Occupational History  . Retired      Surveyor, minerals   Social History Main Topics  . Smoking status: Former Smoker -- 2.5 packs/day for 30 years    Quit date: 06/09/1991  . Smokeless tobacco: Never Used  . Alcohol Use: No  . Drug Use: No  . Sexually Active: No   Other Topics Concern  . 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  Denies fever, chills, diaphoresis  HEENT: Denies photophobia, eye pain, redness, hearing loss, ear pain, congestion, sore throat, rhinorrhea, sneezing, mouth sores, trouble swallowing, neck pain, neck stiffness and tinnitus.   Respiratory: Positive for SOB. Denies DOE, cough, chest tightness,  and wheezing.   Cardiovascular: Positive for leg swelling  Denies chest pain, palpitations Gastrointestinal: Denies nausea, vomiting, abdominal pain, diarrhea, constipation, blood in stool and abdominal distention.  Genitourinary: Positive for polyuria and polydipsia.  Denies dysuria, urgency, frequency, hematuria, flank pain and difficulty urinating.  Musculoskeletal: Denies myalgias, back pain, joint swelling, arthralgias and gait problem.  Skin: Denies pallor, rash and wound.  Neurological: Denies dizziness, seizures, syncope, weakness, light-headedness, numbness and headaches.  Hematological: Denies adenopathy. Easy bruising, personal or family bleeding history  Psychiatric/Behavioral: Denies suicidal ideation, mood changes, confusion, nervousness, sleep disturbance and agitation  Objective:  Physical Exam: Filed Vitals:   06/06/12 1109  BP: 165/82  Pulse: 53  Temp: 97.2 F (36.2 C)  TempSrc: Oral  Height: 5\' 7"  (1.702 m)  Weight: 173 lb 6.4 oz (78.654 kg)  SpO2: 99%   Constitutional: Vital signs reviewed.  Patient is a well-developed and well-nourished elderly appearing man in no acute distress and  cooperative with exam. Alert and oriented x3.  Head: Normocephalic and atraumatic Ear: TM normal bilaterally Mouth: no erythema or exudates, MMM Eyes: PERRL, EOMI, conjunctivae normal, No scleral icterus.  Neck: Supple, Trachea midline normal ROM, No JVD, mass, thyromegaly, or carotid bruit present.  Cardiovascular: RRR, S1 normal, S2 normal, no MRG, pulses symmetric and intact bilaterally Pulmonary/Chest: CTAB, no wheezes, rales, or rhonchi Abdominal: Soft. Non-tender, non-distended, bowel sounds are normal, no masses, organomegaly, or guarding present.  GU: no CVA tenderness Musculoskeletal: No joint deformities,  erythema, or stiffness, ROM full and no nontender Hematology: no cervical, inginal, or axillary adenopathy.  Neurological: A&O x3, Strength is normal and symmetric bilaterally, cranial nerve II-XII are grossly intact, no focal motor deficit, sensory intact to light touch bilaterally.  Skin: +3 pitting edema to the knee bilaterally.  Warm, dry and intact. No rash, cyanosis, or clubbing.  Psychiatric: Normal mood and variable affect. speech and behavior is normal. Judgment and insight are poor.  Thought content normal. Cognition slowed and memory mildly impaired today.   Assessment & Plan:

## 2012-06-09 ENCOUNTER — Telehealth: Payer: Self-pay | Admitting: Internal Medicine

## 2012-06-09 NOTE — Telephone Encounter (Signed)
Patient called in with some SOB, has taken his lasix 160 mg today but feels like there is more fluid in him. He also states he gets confused as to whether he took his metolazone this morning but isn't sure. He also states that sometimes his mestinon not taking effect quickly can feel like this as well. He is still able to ambulate to the bathroom and back without problem.   Advised him to take an extra dose of 160 mg lasix this evening and to come to the ED if his breathing worsens. Advised him not to take an additional dose of mestinon.

## 2012-06-10 ENCOUNTER — Encounter (HOSPITAL_COMMUNITY): Payer: Self-pay | Admitting: Emergency Medicine

## 2012-06-10 ENCOUNTER — Emergency Department (HOSPITAL_COMMUNITY)
Admission: EM | Admit: 2012-06-10 | Discharge: 2012-06-10 | DRG: 195 | Disposition: A | Discharge status: Home / Self Care | Payer: Medicare Other | Attending: Emergency Medicine | Admitting: Emergency Medicine

## 2012-06-10 ENCOUNTER — Telehealth: Payer: Self-pay | Admitting: *Deleted

## 2012-06-10 ENCOUNTER — Emergency Department (HOSPITAL_COMMUNITY): Payer: Medicare Other

## 2012-06-10 DIAGNOSIS — F988 Other specified behavioral and emotional disorders with onset usually occurring in childhood and adolescence: Secondary | ICD-10-CM | POA: Diagnosis present

## 2012-06-10 DIAGNOSIS — Z87891 Personal history of nicotine dependence: Secondary | ICD-10-CM

## 2012-06-10 DIAGNOSIS — E119 Type 2 diabetes mellitus without complications: Secondary | ICD-10-CM | POA: Diagnosis present

## 2012-06-10 DIAGNOSIS — G7000 Myasthenia gravis without (acute) exacerbation: Secondary | ICD-10-CM | POA: Diagnosis present

## 2012-06-10 DIAGNOSIS — F3289 Other specified depressive episodes: Secondary | ICD-10-CM | POA: Diagnosis present

## 2012-06-10 DIAGNOSIS — I509 Heart failure, unspecified: Secondary | ICD-10-CM

## 2012-06-10 DIAGNOSIS — Z794 Long term (current) use of insulin: Secondary | ICD-10-CM

## 2012-06-10 DIAGNOSIS — E785 Hyperlipidemia, unspecified: Secondary | ICD-10-CM | POA: Diagnosis present

## 2012-06-10 DIAGNOSIS — J189 Pneumonia, unspecified organism: Secondary | ICD-10-CM | POA: Diagnosis present

## 2012-06-10 DIAGNOSIS — I2510 Atherosclerotic heart disease of native coronary artery without angina pectoris: Secondary | ICD-10-CM | POA: Diagnosis present

## 2012-06-10 DIAGNOSIS — I10 Essential (primary) hypertension: Secondary | ICD-10-CM | POA: Diagnosis present

## 2012-06-10 DIAGNOSIS — F329 Major depressive disorder, single episode, unspecified: Secondary | ICD-10-CM | POA: Diagnosis present

## 2012-06-10 DIAGNOSIS — I4510 Unspecified right bundle-branch block: Secondary | ICD-10-CM | POA: Diagnosis present

## 2012-06-10 DIAGNOSIS — G4733 Obstructive sleep apnea (adult) (pediatric): Secondary | ICD-10-CM | POA: Diagnosis present

## 2012-06-10 DIAGNOSIS — Z79899 Other long term (current) drug therapy: Secondary | ICD-10-CM

## 2012-06-10 LAB — CBC WITH DIFFERENTIAL/PLATELET
Basophils Relative: 0 % (ref 0–1)
HCT: 39.6 % (ref 39.0–52.0)
Hemoglobin: 14.6 g/dL (ref 13.0–17.0)
Lymphs Abs: 1.3 10*3/uL (ref 0.7–4.0)
MCH: 28.3 pg (ref 26.0–34.0)
MCHC: 36.9 g/dL — ABNORMAL HIGH (ref 30.0–36.0)
Monocytes Absolute: 0.7 10*3/uL (ref 0.1–1.0)
Monocytes Relative: 7 % (ref 3–12)
Neutro Abs: 7.5 10*3/uL (ref 1.7–7.7)

## 2012-06-10 LAB — GLUCOSE, CAPILLARY: Glucose-Capillary: 355 mg/dL — ABNORMAL HIGH (ref 70–99)

## 2012-06-10 LAB — COMPREHENSIVE METABOLIC PANEL
ALT: 12 U/L (ref 0–53)
AST: 16 U/L (ref 0–37)
Calcium: 10.1 mg/dL (ref 8.4–10.5)
GFR calc Af Amer: >90 mL/min (ref >90)
Sodium: 132 mEq/L — ABNORMAL LOW (ref 135–145)
Total Protein: 6.7 g/dL (ref 6.0–8.3)

## 2012-06-10 LAB — POCT I-STAT TROPONIN I: Troponin i, poc: 0.09 ng/mL (ref 0.00–0.08)

## 2012-06-10 MED ORDER — FUROSEMIDE 10 MG/ML IJ SOLN
40.0000 mg | Freq: Once | INTRAMUSCULAR | Status: AC
  Administered 2012-06-10: 40 mg via INTRAVENOUS
  Filled 2012-06-10: qty 4

## 2012-06-10 NOTE — ED Notes (Signed)
Radiology at bedside to perform CXR.

## 2012-06-10 NOTE — Telephone Encounter (Signed)
Agree with ER eval.

## 2012-06-10 NOTE — ED Notes (Signed)
Troponin results shown to Dr. Estell Harpin

## 2012-06-10 NOTE — ED Notes (Signed)
Pt reports he has myasthenia gravis and is very SOB. States he has noticed increased swelling in lower extremities. Denies chest pain.

## 2012-06-10 NOTE — Telephone Encounter (Signed)
Pt calls and states 3-4 days he has intermittent dizziness, at this time he is taking 160mg  lasix twice daily, not sleeping, when he tries to lay down he becomes very short of breath x 5 days, when he tries to ambulate he becomes very short of breath. He is ask to go now to ED and is agreeable

## 2012-06-10 NOTE — ED Provider Notes (Signed)
History     CSN: [id number]  Arrival date & time 06/10/12  1152   First MD Initiated Contact with Patient 06/10/12 1208      Chief Complaint  Patient presents with  . Shortness of Breath    (Consider location/radiation/quality/duration/timing/severity/associated sxs/prior treatment) Patient is a 67 y.o. male presenting with shortness of breath. The history is provided by the patient (the pt complains of sob). No language interpreter was used.  Shortness of Breath  The current episode started 3 to 5 days ago. The problem occurs frequently. The problem has been unchanged. The problem is moderate. Nothing relieves the symptoms. Nothing aggravates the symptoms. Associated symptoms include shortness of breath. Pertinent negatives include no chest pain and no cough.    Past Medical History  Diagnosis Date  . Sinus tachycardia 2006    a. Nonobstructive CAD by cath 2013. b. s/p atrial tachycardia ablation 08/2011 (sinus node modification)  . Orthostatic hypotension   . Unspecified essential hypertension   . Hyperlipidemia   . Cervical spondylosis with radiculopathy   . Syrinx     T5-T6  . Cord compression     Cord compression syndrome C5-C6, C6-C7  . ADD (attention deficit disorder)   . Decreased libido   . Depression   . DM (diabetes mellitus)     adult onset  . Pulmonary nodule, left     Evaluated 04/2011 by pulmonology - felt to be benign granuloma  . Peripheral edema   . 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 LV dysfunction     By echo 2013 at [facility] - reported EF 45% per pt  . Colitis     with surface exudate  . Hemorrhoids     small  . Pseudomembranous colitis   . Gastritis   . Clostridium difficile infection 02/15/2012  . Shortness of breath   . CHF (congestive heart failure)   . Right bundle branch block and left posterior fascicular block   . Sleep apnea     occassionally uses cpap  . Myasthenia gravis     Status post thymectomy  April 2012, hx of plasmapheresis    Past Surgical History  Procedure Date  . Thymectomy april 2012  . Colonoscopy 11/13/2011    Procedure: COLONOSCOPY;  Surgeon: Theda Belfast, MD;  Location: [facility];  Service: Endoscopy;  Laterality: N/A;  . Cardiac catheterization   . Esophagogastroduodenoscopy 05/13/2012    Procedure: ESOPHAGOGASTRODUODENOSCOPY (EGD);  Surgeon: Iva Boop, MD;  Location: [facility];  Service: Endoscopy;  Laterality: N/A;  needs PAT w/ dx of Myastenia gravis and multiple meds. Pt requested lidocaine to numb IV site prior to start.  Very needle phobic.    Family History  Problem Relation Age of Onset  . Breast cancer Mother   . Other Father     Beck's Sarcoid  . Colon cancer Neg Hx   . Prostate cancer Neg Hx     History  Substance Use Topics  . Smoking status: Former Smoker -- 2.5 packs/day for 30 years    Quit date: 06/09/1991  . Smokeless tobacco: Never Used  . Alcohol Use: No      Review of Systems  Constitutional: Negative for fatigue.  HENT: Negative for congestion, sinus pressure and ear discharge.   Eyes: Negative for discharge.  Respiratory: Positive for shortness of breath. Negative for cough.   Cardiovascular: Negative for chest pain.  Gastrointestinal: Negative for abdominal pain and diarrhea.  Genitourinary: Negative for frequency and hematuria.  Musculoskeletal: Negative for back pain.  Skin: Negative for rash.  Neurological: Negative for seizures and headaches.  Hematological: Negative.   Psychiatric/Behavioral: Negative for hallucinations.    Allergies  Aminoglycosides; Beta adrenergic blockers; Calcium channel blockers; Metoprolol; Neuromuscular blocking agents; Other; Penicillins; and Quinine derivatives  Home Medications   Current Outpatient Rx  Name  Route  Sig  Dispense  Refill  . BENAZEPRIL HCL 40 MG PO TABS   Oral   Take 40 mg by mouth daily before breakfast.         . FUROSEMIDE 40 MG PO TABS   Oral    Take 4 tablets (160 mg total) by mouth 2 (two) times daily. Restarted together with Metalazone   90 tablet   2   . INSULIN GLARGINE 100 UNIT/ML [place] SOLN   Subcutaneous   Inject 20-50 Units into the skin at bedtime.         Marland Kitchen METOLAZONE 5 MG PO TABS   Oral   Take 1 tablet by mouth daily.          . OXYCODONE HCL 5 MG PO TABS   Oral   Take 5 mg by mouth every 4 (four) hours as needed. For pain         . PANTOPRAZOLE SODIUM 40 MG PO TBEC   Oral   Take 40 mg by mouth 2 (two) times daily.          Marland Kitchen POTASSIUM CHLORIDE CRYS ER 20 MEQ PO TBCR   Oral   Take 20 mEq by mouth 2 (two) times daily at 10 AM and 5 PM. With the lasix when he thinks about it         . PREDNISONE 5 MG PO TABS   Oral   Take 15 mg by mouth daily.         Marland Kitchen PYRIDOSTIGMINE BROMIDE 60 MG PO TABS   Oral   Take 60 mg by mouth every 3 (three) hours. Medication starts to wear off closer to 3 hours         . ZOLPIDEM TARTRATE 10 MG PO TABS   Oral   Take 1 tablet (10 mg total) by mouth at bedtime.   30 tablet   5     BP 164/102  Pulse 93  Temp 98.6 F (37 C) (Oral)  Resp 24  SpO2 92%  Physical Exam  Constitutional: He is oriented to person, place, and time. He appears well-developed.  HENT:  Head: Normocephalic and atraumatic.  Eyes: Conjunctivae normal and EOM are normal. No scleral icterus.  Neck: Neck supple. No thyromegaly present.  Cardiovascular: Normal rate and regular rhythm.  Exam reveals no gallop and no friction rub.   No murmur heard. Pulmonary/Chest: No stridor. He has no wheezes. He has no rales. He exhibits no tenderness.  Abdominal: He exhibits no distension. There is no tenderness. There is no rebound.  Musculoskeletal: Normal range of motion. He exhibits edema.  Lymphadenopathy:    He has no cervical adenopathy.  Neurological: He is oriented to person, place, and time. Coordination normal.  Skin: No rash noted. No erythema.  Psychiatric: He has a normal mood and affect.  His behavior is normal.    ED Course  Procedures (including critical care time)  Labs Reviewed  COMPREHENSIVE METABOLIC PANEL - Abnormal; Notable for the following:    Sodium 132 (*)     Chloride 92 (*)     Glucose, Bld  282 (*)     BUN 47 (*)     Albumin 2.9 (*)     Alkaline Phosphatase 143 (*)     GFR calc non Af Amer 85 (*)     All other components within normal limits  CBC WITH DIFFERENTIAL - Abnormal; Notable for the following:    MCV 76.7 (*)     MCHC 36.9 (*)  CORRECTED FOR COLD AGGLUTININS   All other components within normal limits  PRO B NATRIURETIC PEPTIDE - Abnormal; Notable for the following:    Pro B Natriuretic peptide (BNP) 3444.0 (*)     All other components within normal limits  GLUCOSE, CAPILLARY - Abnormal; Notable for the following:    Glucose-Capillary 355 (*)     All other components within normal limits  POCT I-STAT TROPONIN I - Abnormal; Notable for the following:    Troponin i, poc 0.09 (*)     All other components within normal limits  TROPONIN I   Dg Chest Port 1 View  06/10/2012  *RADIOLOGY REPORT*  Clinical Data: Chronic shortness of breath, worse today.  Prior history of CHF.  Former smoker.  PORTABLE CHEST - 1 VIEW 06/10/2012 1313 hours:  Comparison: Two-view chest x-ray 05/18/2012, 12/14/2011, 10/05/2010.  CT chest 08/17/2010.  Findings: Prior sternotomy.  Cardiac silhouette mildly enlarged for the AP portable technique, unchanged.  Calcified granuloma deep in the left lower lobe as noted on the prior CT.  Lungs otherwise clear.  Pulmonary vascularity normal without evidence of pulmonary edema.  No visible pleural effusions.  IMPRESSION: Stable cardiomegaly.  No acute cardiopulmonary disease.   Original Report Authenticated By: Hulan Saas, M.D.      1. Community acquired pneumonia     Date: 06/10/2012  Rate: 111  Rhythm: atrial fibrillation  QRS Axis: left  Intervals: normal  ST/T Wave abnormalities: nonspecific ST changes  Conduction  Disutrbances:none  Narrative Interpretation:   Old EKG Reviewed: none available    MDM  Pt improved after lasix and wanted to go home and follow up         Benny Lennert, MD 06/10/12 1506

## 2012-06-10 NOTE — ED Notes (Signed)
Pt reports he started to feel very SOB this morning and called his PCP and then took his morning meds. He still felt SOB so he came to ED. Pt reports he is starting to feel better but has noticed extra swelling in lower extremities.

## 2012-06-17 ENCOUNTER — Telehealth: Payer: Self-pay | Admitting: *Deleted

## 2012-06-17 DIAGNOSIS — I5022 Chronic systolic (congestive) heart failure: Secondary | ICD-10-CM

## 2012-06-17 DIAGNOSIS — E876 Hypokalemia: Secondary | ICD-10-CM

## 2012-06-17 NOTE — Telephone Encounter (Signed)
Pt calls and leaves message that he was seen in [place], needs labs next week for renal function and K+, can you order these, i will call him back with an appt after orders are placed.

## 2012-06-17 NOTE — Telephone Encounter (Signed)
i have called and left pt several messages this pm, no rtc as of yet

## 2012-06-17 NOTE — Telephone Encounter (Signed)
Order is done for a future Bmet.  Do they want the results sent back to them in [facility] or just have me deal with it?

## 2012-06-20 NOTE — Telephone Encounter (Signed)
Spoke w/ pt he will plan on coming for labs thurs am 1/16, he will bring orders w/ him from 1 md

## 2012-06-28 ENCOUNTER — Telehealth: Payer: Self-pay | Admitting: *Deleted

## 2012-06-28 ENCOUNTER — Other Ambulatory Visit (INDEPENDENT_AMBULATORY_CARE_PROVIDER_SITE_OTHER): Payer: Medicare Other

## 2012-06-28 DIAGNOSIS — I5022 Chronic systolic (congestive) heart failure: Secondary | ICD-10-CM

## 2012-06-28 DIAGNOSIS — G7000 Myasthenia gravis without (acute) exacerbation: Secondary | ICD-10-CM

## 2012-06-28 DIAGNOSIS — E876 Hypokalemia: Secondary | ICD-10-CM

## 2012-06-28 LAB — BASIC METABOLIC PANEL
BUN: 56 mg/dL — ABNORMAL HIGH (ref 6–23)
Calcium: 10.2 mg/dL (ref 8.4–10.5)
Creat: 1.18 mg/dL (ref 0.50–1.35)
Potassium: 5.3 mEq/L (ref 3.5–5.3)

## 2012-06-28 LAB — CBC
HCT: 38.1 % — ABNORMAL LOW (ref 39.0–52.0)
Hemoglobin: 13.5 g/dL (ref 13.0–17.0)
MCV: 79.7 fL (ref 78.0–100.0)
RBC: 4.78 MIL/uL (ref 4.22–5.81)
WBC: 8.1 10*3/uL (ref 4.0–10.5)

## 2012-06-28 LAB — HEPATIC FUNCTION PANEL
Albumin: 2.9 g/dL — ABNORMAL LOW (ref 3.5–5.2)
Bilirubin, Direct: 0.2 mg/dL (ref 0.0–0.3)
Total Bilirubin: 1 mg/dL (ref 0.3–1.2)

## 2012-06-28 NOTE — Progress Notes (Signed)
BMP results faxed to Dr. Elza Rafter, [facility] Attn: Corrie Dandy (nurse) [phone]  per the request of the patient, [name], PBT  06-28-12  1456p

## 2012-06-28 NOTE — Telephone Encounter (Signed)
I talked with Misty Stanley, Environmental health practitioner to Dr Clarisa Kindred at [facility] regarding lab draws for pt.  Pt should have baseline labs done and sent to [facility].  They will call pt and tell him to start medication.  One week after start of meds he should start weekly lab work. Lias asked that we call her when we send labs to make her aware they are coming. # [phone]  This information given to our lab/Tracy.

## 2012-06-28 NOTE — Telephone Encounter (Signed)
Sounds good.  The key to this is if [name] has decided to start the medication and will continue it.  Discussed this with [name] and she will check to see if the patient is taking the medication.

## 2012-06-28 NOTE — Progress Notes (Signed)
Faxed labs results to Dr. Clarisa Kindred, [facility], Neurology   [phone]  06-28-12 1329p  Called and spoke Stan Head. Asst. , to let her know the fax was on its way.  06-28-12  1335p  Alric Quan, PBT

## 2012-06-29 ENCOUNTER — Encounter: Payer: Self-pay | Admitting: Licensed Clinical Social Worker

## 2012-06-29 NOTE — Progress Notes (Unsigned)
Patient ID: RENATO SPELLMAN, male   DOB: Mar 05, 1946, 67 y.o.   MRN: [id number] Mr. Tomassetti requesting to speak with CSW following his appt at [facility].  Pt states he would like referral and cost for hearing exam and hearing aid.  CSW informed Mr. Stehlin, CSW would need to look into which insurance pt had and benefits available.  Pt agreed and provided CSW with his email address to email pt referral information.  After reviewing Mr. Sedano insurance information, pt has a Medicare replacement plan with [employer] H[employer].  CSW unable to confirm benefits but was able to obtain providers in the area and the phone number to obtain hearing benefit information.  Per pt request, CSW emailed pt listing of in-network audiologist per the [facility] provider finder website, as well as, information from [employer] regarding free hearing test with contact information.  CSW will provide follow up call to Mr. Requena after discussing parking questions pt had with Interior and spatial designer.  CSW informed pt will provide follow up phone call on Friday, Jan. 24.

## 2012-07-01 ENCOUNTER — Other Ambulatory Visit: Payer: Self-pay | Admitting: Internal Medicine

## 2012-07-01 ENCOUNTER — Telehealth: Payer: Self-pay | Admitting: *Deleted

## 2012-07-01 ENCOUNTER — Telehealth: Payer: Self-pay | Admitting: Internal Medicine

## 2012-07-01 NOTE — Telephone Encounter (Signed)
Agree - would need persistent watery stools before we did studies. Cellcept (important med for his myasthenia) can also loosen up the stools

## 2012-07-01 NOTE — Telephone Encounter (Signed)
Pt calls and states when he sees dr Tonny Branch, they need to discuss his ongoing of appr 6mon hip pain. Also he states that recently his cbg's have been 300+ and thinks possibly his insulin may need to be changed., please advise if appt needs to be moved up with someone else

## 2012-07-01 NOTE — Telephone Encounter (Signed)
Patient reports that he is having some loose stool on occasional.  He has a history of pseudomemrenous colitis.  He is having a "urgent BM".  The stool is not liquid and he is not having multiple stools.  He is requesting stool studies be performed.  He has restarted on Cellcept yesterday.  He reports that he has been drinking a large amount of coffee and not eating much lately.  I explained that to some people coffee is a laxative and that he should cut back and see if this helps.  "I didn't have diarrhea, but I felt like I was going to".  Sounds like he is seeing mucous in his stool. He is going to try cutting back on coffee and call me next week if his symptoms worsen.  He denies any other complaints.

## 2012-07-01 NOTE — Telephone Encounter (Signed)
He is scheduled to see me in 2 weeks.  If he doesn't feel that he can wait that long he should have an appointment.  Please remind him to bring his medication to his appointment so we can be sure what he is taking.  He states that he takes between 20-50 units of lantus but his last prescribed dose was supposed to be 15 units.  I am wondering if he gets his medications mixed up.  He states that he is taking the shots.  Please have him keep a log of when he takes his insulin as well as what his blood sugars are and bring them to the next appointment so we can look it over.  Also to bring his meter with him to his next appointment.

## 2012-07-04 ENCOUNTER — Telehealth: Payer: Self-pay | Admitting: *Deleted

## 2012-07-04 ENCOUNTER — Telehealth: Payer: Self-pay | Admitting: Internal Medicine

## 2012-07-04 NOTE — Telephone Encounter (Signed)
Patient called around 8AM today stating that  his SOB is "starting to flare up" and has been taking Lasix 160mg  bid, and Metalozone 5mg  without improvement.  He has swelling in his legs and cramps in his hands.  I advised patient to go to the ED for evaluation but he said he will "ride it out" and refused to go to ED.  I emphasized how important it is for him to be seen emergently in the ED and if his SOB is worsen, he may have complications including death.  I will forward this note to his PCP. I did discuss with Dr. Tonny Branch who agreed that he need to be seen in the ED today; unfortunately, patient again refused after multiple recommendations.

## 2012-07-04 NOTE — Telephone Encounter (Signed)
Better if he went to ED. Since he refused, clinic appointment ok. Please let him know that he has to seek urgent care if he worsens overnight.

## 2012-07-04 NOTE — Telephone Encounter (Signed)
Pt called leaving a message that he needs to be seen. I returned call but no answer, message left.

## 2012-07-04 NOTE — Telephone Encounter (Signed)
Pt called with c/o SOB, unable to lay flat.  Onset 2 days ago, he has taken lasix, as ordered and called to his heart doctor in [facility] and he states he was told to take a  dose of zaroxolyn 5 mg, 2 days in a row.  Still SOB but better.  Also had swelling to knee and that has improved. He went to ED to have fluid pulled off his knee, but the wait was to long so he went home.  Pt asked to be seen in ED for evaluation, refused.  I offered an appointment for tomorrow AM and he will come in at 8:15

## 2012-07-04 NOTE — Telephone Encounter (Signed)
Pt informed

## 2012-07-05 ENCOUNTER — Ambulatory Visit: Payer: Medicare Other | Admitting: Internal Medicine

## 2012-07-05 ENCOUNTER — Emergency Department (HOSPITAL_COMMUNITY)
Admission: EM | Admit: 2012-07-05 | Discharge: 2012-07-05 | Disposition: A | Discharge status: Home / Self Care | Payer: Medicare Other | Attending: Emergency Medicine | Admitting: Emergency Medicine

## 2012-07-05 ENCOUNTER — Encounter: Payer: Self-pay | Admitting: *Deleted

## 2012-07-05 ENCOUNTER — Encounter (HOSPITAL_COMMUNITY): Payer: Self-pay | Admitting: Cardiology

## 2012-07-05 DIAGNOSIS — Z8619 Personal history of other infectious and parasitic diseases: Secondary | ICD-10-CM | POA: Insufficient documentation

## 2012-07-05 DIAGNOSIS — R42 Dizziness and giddiness: Secondary | ICD-10-CM | POA: Insufficient documentation

## 2012-07-05 DIAGNOSIS — IMO0002 Reserved for concepts with insufficient information to code with codable children: Secondary | ICD-10-CM | POA: Insufficient documentation

## 2012-07-05 DIAGNOSIS — I509 Heart failure, unspecified: Secondary | ICD-10-CM | POA: Insufficient documentation

## 2012-07-05 DIAGNOSIS — I10 Essential (primary) hypertension: Secondary | ICD-10-CM | POA: Insufficient documentation

## 2012-07-05 DIAGNOSIS — Z8679 Personal history of other diseases of the circulatory system: Secondary | ICD-10-CM | POA: Insufficient documentation

## 2012-07-05 DIAGNOSIS — G4730 Sleep apnea, unspecified: Secondary | ICD-10-CM | POA: Insufficient documentation

## 2012-07-05 DIAGNOSIS — Z8669 Personal history of other diseases of the nervous system and sense organs: Secondary | ICD-10-CM | POA: Insufficient documentation

## 2012-07-05 DIAGNOSIS — E119 Type 2 diabetes mellitus without complications: Secondary | ICD-10-CM | POA: Insufficient documentation

## 2012-07-05 DIAGNOSIS — Z8659 Personal history of other mental and behavioral disorders: Secondary | ICD-10-CM | POA: Insufficient documentation

## 2012-07-05 DIAGNOSIS — E785 Hyperlipidemia, unspecified: Secondary | ICD-10-CM | POA: Insufficient documentation

## 2012-07-05 DIAGNOSIS — R748 Abnormal levels of other serum enzymes: Secondary | ICD-10-CM | POA: Insufficient documentation

## 2012-07-05 DIAGNOSIS — Z87891 Personal history of nicotine dependence: Secondary | ICD-10-CM | POA: Insufficient documentation

## 2012-07-05 DIAGNOSIS — Z0189 Encounter for other specified special examinations: Secondary | ICD-10-CM

## 2012-07-05 DIAGNOSIS — I2510 Atherosclerotic heart disease of native coronary artery without angina pectoris: Secondary | ICD-10-CM | POA: Insufficient documentation

## 2012-07-05 DIAGNOSIS — R7989 Other specified abnormal findings of blood chemistry: Secondary | ICD-10-CM

## 2012-07-05 DIAGNOSIS — Z8739 Personal history of other diseases of the musculoskeletal system and connective tissue: Secondary | ICD-10-CM | POA: Insufficient documentation

## 2012-07-05 DIAGNOSIS — R609 Edema, unspecified: Secondary | ICD-10-CM

## 2012-07-05 DIAGNOSIS — Z794 Long term (current) use of insulin: Secondary | ICD-10-CM | POA: Insufficient documentation

## 2012-07-05 DIAGNOSIS — Z8719 Personal history of other diseases of the digestive system: Secondary | ICD-10-CM | POA: Insufficient documentation

## 2012-07-05 DIAGNOSIS — Z79899 Other long term (current) drug therapy: Secondary | ICD-10-CM | POA: Insufficient documentation

## 2012-07-05 LAB — POCT I-STAT, CHEM 8
BUN: 54 mg/dL — ABNORMAL HIGH (ref 6–23)
Creatinine, Ser: 1.7 mg/dL — ABNORMAL HIGH (ref 0.50–1.35)
HCT: 42 % (ref 39.0–52.0)
Hemoglobin: 14.3 g/dL (ref 13.0–17.0)
Potassium: 4.8 mEq/L (ref 3.5–5.1)
Sodium: 133 mEq/L — ABNORMAL LOW (ref 135–145)

## 2012-07-05 LAB — GLUCOSE, CAPILLARY: Glucose-Capillary: 265 mg/dL — ABNORMAL HIGH (ref 70–99)

## 2012-07-05 MED ORDER — FUROSEMIDE 10 MG/ML IJ SOLN
80.0000 mg | Freq: Once | INTRAMUSCULAR | Status: AC
  Administered 2012-07-05: 80 mg via INTRAVENOUS
  Filled 2012-07-05: qty 8

## 2012-07-05 NOTE — Progress Notes (Unsigned)
Pt presented appr 1 hr late for his appt this am, i spoke w/ dr Collier Bullock and pt will need to wait for possible no show later in am, pt appears weak, short of breath and continues to rub chest. He is advised instead of waiting he will need to be taken to ED due to his illness, jims took pt via wh/ch to ED, i called chg nurse at [phone] and updated her

## 2012-07-05 NOTE — ED Notes (Addendum)
Pt refused to put on a gown or sit on the bed. States he prefers to sit in the wheelchair. States he is only here to have his labs checked and needs lasix. Denies any chest pain or current SOB. Reports he needs to eat something because he is slightly dizzy. States he is at his baseline and has no complaints at this time. Pt with swelling to bilateral lower extremities, but reports he is at his baseline.

## 2012-07-05 NOTE — ED Notes (Signed)
Pt reports he was suppose to be seen at the clinics downstairs and was late for his appt. States he does not want to get into a gown or be placed on cardiac monitor. Wants to have his potassium checked and receive lasix.

## 2012-07-05 NOTE — ED Provider Notes (Signed)
History     CSN: [id number]  Arrival date & time 07/05/12  0930   First MD Initiated Contact with Patient 07/05/12 [phone]      Chief Complaint  Patient presents with  . Shortness of Breath    (Consider location/radiation/quality/duration/timing/severity/associated sxs/prior treatment) HPI Comments: 67 y/o male h/o CHF, DM, CAD, MG p/w wanting "potassium checked". States he was late to clinic appointment today. They told him to come to ED for evaluation. Patient states he feels himself. No worsening of baseline SOB. No chest pain. Has no new complaints. Requests his potassium to be checked.  Patient is a 67 y.o. male presenting with shortness of breath and general illness.  Shortness of Breath  Associated symptoms include shortness of breath (chronic. no change). Pertinent negatives include no chest pain, no fever, no rhinorrhea and no cough.  Illness  The current episode started more than 2 weeks ago. The problem occurs continuously. The problem has been unchanged. The problem is mild. Nothing relieves the symptoms. Nothing aggravates the symptoms. Pertinent negatives include no fever, no abdominal pain, no diarrhea, no nausea, no vomiting, no congestion, no headaches, no rhinorrhea, no cough, no rash and no eye pain.    Past Medical History  Diagnosis Date  . Sinus tachycardia 2006    a. Nonobstructive CAD by cath 2013. b. s/p atrial tachycardia ablation 08/2011 (sinus node modification)  . Orthostatic hypotension   . Unspecified essential hypertension   . Hyperlipidemia   . Cervical spondylosis with radiculopathy   . Syrinx     T5-T6  . Cord compression     Cord compression syndrome C5-C6, C6-C7  . ADD (attention deficit disorder)   . Decreased libido   . Depression   . DM (diabetes mellitus)     adult onset  . Pulmonary nodule, left     Evaluated 04/2011 by pulmonology - felt to be benign granuloma  . Peripheral edema   . CAD (coronary artery disease)     Mild  nonobstructive CAD by cath 08/2011  . LV dysfunction     By echo 2013 at [facility] - reported EF 45% per pt  . Colitis     with surface exudate  . Hemorrhoids     small  . Pseudomembranous colitis   . Gastritis   . Clostridium difficile infection 02/15/2012  . Shortness of breath   . CHF (congestive heart failure)   . Right bundle branch block and left posterior fascicular block   . Sleep apnea     occassionally uses cpap  . 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 Thymectomy april 2012  . Colonoscopy 11/13/2011    Procedure: COLONOSCOPY;  Surgeon: Theda Belfast, MD;  Location: [facility];  Service: Endoscopy;  Laterality: N/A;  . Cardiac catheterization   . Esophagogastroduodenoscopy 05/13/2012    Procedure: ESOPHAGOGASTRODUODENOSCOPY (EGD);  Surgeon: Iva Boop, MD;  Location: [facility];  Service: Endoscopy;  Laterality: N/A;  needs PAT w/ dx of Myastenia gravis and multiple meds. Pt requested lidocaine to numb IV site prior to start.  Very needle phobic.    Family History  Problem Relation Age of Onset  . Breast cancer Mother   . Other Father     Beck's Sarcoid  . Colon cancer Neg Hx   . Prostate cancer Neg Hx     History  Substance Use Topics  . Smoking status:  Former Smoker -- 2.5 packs/day for 30 years    Quit date: 06/09/1991  . Smokeless tobacco: Never Used  . Alcohol Use: No      Review of Systems  Constitutional: Negative for fever and chills.  HENT: Negative for congestion and rhinorrhea.   Eyes: Negative for pain and visual disturbance.  Respiratory: Positive for shortness of breath (chronic. no change). Negative for cough.   Cardiovascular: Positive for leg swelling (chronic. no change). Negative for chest pain.  Gastrointestinal: Negative for nausea, vomiting, abdominal pain and diarrhea.  Genitourinary: Negative for flank pain and difficulty urinating.    Musculoskeletal: Negative for back pain.  Skin: Negative for color change and rash.  Neurological: Positive for light-headedness (mild, intermittent, chronic for years. no change). Negative for dizziness and headaches.  All other systems reviewed and are negative.    Allergies  Aminoglycosides; Beta adrenergic blockers; Calcium channel blockers; Metoprolol; Neuromuscular blocking agents; Other; Penicillins; and Quinine derivatives  Home Medications   Current Outpatient Rx  Name  Route  Sig  Dispense  Refill  . BENAZEPRIL HCL 40 MG PO TABS   Oral   Take 40 mg by mouth daily before breakfast.         . FUROSEMIDE 40 MG PO TABS   Oral   Take 3 tablets (120 mg total) by mouth 2 (two) times daily.   180 tablet   2   . INSULIN GLARGINE 100 UNIT/ML [place] SOLN   Subcutaneous   Inject 20-50 Units into the skin at bedtime.         Marland Kitchen METOLAZONE 5 MG PO TABS   Oral   Take 1 tablet by mouth daily.          . OXYCODONE HCL 5 MG PO TABS   Oral   Take 5 mg by mouth every 4 (four) hours as needed. For pain         . PANTOPRAZOLE SODIUM 40 MG PO TBEC   Oral   Take 40 mg by mouth 2 (two) times daily.          Marland Kitchen POTASSIUM CHLORIDE CRYS ER 20 MEQ PO TBCR   Oral   Take 20 mEq by mouth 2 (two) times daily at 10 AM and 5 PM. With the lasix when he thinks about it         . PREDNISONE 5 MG PO TABS   Oral   Take 15 mg by mouth daily.         Marland Kitchen PYRIDOSTIGMINE BROMIDE 60 MG PO TABS   Oral   Take 60 mg by mouth every 3 (three) hours. Medication starts to wear off closer to 3 hours         . ZOLPIDEM TARTRATE 10 MG PO TABS   Oral   Take 1 tablet (10 mg total) by mouth at bedtime.   30 tablet   5     BP 145/74  Pulse 80  Temp 97.5 F (36.4 C) (Oral)  Resp 18  SpO2 98%  Physical Exam  Nursing note and vitals reviewed. Constitutional: He is oriented to person, place, and time. He appears well-developed and well-nourished. No distress.  HENT:  Head: Normocephalic  and atraumatic.  Eyes: Conjunctivae normal are normal. Right eye exhibits no discharge. Left eye exhibits no discharge.  Neck: No tracheal deviation present.  Cardiovascular: Normal rate, regular rhythm, normal heart sounds and intact distal pulses.   Pulmonary/Chest: Effort normal and breath sounds normal. No stridor. No respiratory  distress. He has no wheezes. He has no rales.  Abdominal: Soft. He exhibits no distension. There is no tenderness. There is no guarding.  Musculoskeletal: He exhibits edema. He exhibits no tenderness.  Neurological: He is alert and oriented to person, place, and time. He has normal strength. No cranial nerve deficit or sensory deficit. GCS eye subscore is 4. GCS verbal subscore is 5. GCS motor subscore is 6.  Skin: Skin is warm and dry.  Psychiatric: He has a normal mood and affect. His behavior is normal.    ED Course  Procedures (including critical care time)  Labs Reviewed  GLUCOSE, CAPILLARY - Abnormal; Notable for the following:    Glucose-Capillary 265 (*)     All other components within normal limits  POCT I-STAT, CHEM 8 - Abnormal; Notable for the following:    Sodium 133 (*)     BUN 54 (*)     Creatinine, Ser 1.70 (*)     Glucose, Bld 268 (*)     All other components within normal limits   No results found.   1. Encounter for laboratory test   2. Edema   3. Elevated serum creatinine       MDM   67 y/o male p/w desire for lab check. States he has no complaints. Was late for clinic visit today. Told to come here. No worsening of chronic complaints. No chest pain. Recently started on cellcept. Potassium 4.8. Mild elevation in creatinine. Possibly 2/2 to increased lasix dose and cellcept initiation. Patient discharged home. Return precautions given. To follow up with pcp. patient in agreement with plan.  Labs and imaging reviewed by myself and considered in medical decision making if ordered. Imaging interpreted by radiology.   Discussed  case with Dr. Anitra Lauth who is in agreement with assessment and plan.     Stevie Kern, MD 07/05/12 1556

## 2012-07-05 NOTE — Progress Notes (Signed)
Agree with this plan.

## 2012-07-06 NOTE — ED Provider Notes (Signed)
I saw and evaluated the patient, reviewed the resident's note and I agree with the findings and plan. Patient is here because he showed up at his clinic appointment late and they told him to come to the emergency room to get his blood checked. He has no complaints he has known CHF but states that baseline and just wants some additional Lasix. His creatinine is mildly bumped up recently started CellCept. He is not hypoxic and has normal vital signs. Patient was discharged home to followup with his PCP on friday  Gwyneth Sprout, MD 07/06/12 1013

## 2012-07-07 ENCOUNTER — Other Ambulatory Visit (INDEPENDENT_AMBULATORY_CARE_PROVIDER_SITE_OTHER): Payer: Medicare Other

## 2012-07-07 DIAGNOSIS — G7000 Myasthenia gravis without (acute) exacerbation: Secondary | ICD-10-CM

## 2012-07-07 DIAGNOSIS — Z79899 Other long term (current) drug therapy: Secondary | ICD-10-CM

## 2012-07-07 LAB — CBC
HCT: 39 % (ref 39.0–52.0)
MCHC: 35.6 g/dL (ref 30.0–36.0)
RDW: 13.2 % (ref 11.5–15.5)

## 2012-07-07 LAB — GAMMA GT: GGT: 48 U/L (ref 7–51)

## 2012-07-07 LAB — HEPATIC FUNCTION PANEL
ALT: 9 U/L (ref 0–53)
Bilirubin, Direct: 0.2 mg/dL (ref 0.0–0.3)

## 2012-07-07 NOTE — Progress Notes (Signed)
Orders per Omer Jack, Dept Neuro, [facility]   Lab results faxed to [address] Clarisa Kindred, [facility] [phone] 07-07-2012 1340p Alric Quan, PBT

## 2012-07-08 ENCOUNTER — Telehealth: Payer: Self-pay | Admitting: Internal Medicine

## 2012-07-08 ENCOUNTER — Telehealth: Payer: Self-pay | Admitting: *Deleted

## 2012-07-08 LAB — BASIC METABOLIC PANEL WITH GFR
CO2: 30 mEq/L (ref 19–32)
Calcium: 9.8 mg/dL (ref 8.4–10.5)
Chloride: 92 mEq/L — ABNORMAL LOW (ref 96–112)
GFR, Est African American: 50 mL/min — ABNORMAL LOW
Sodium: 132 mEq/L — ABNORMAL LOW (ref 135–145)

## 2012-07-08 NOTE — Telephone Encounter (Signed)
Pt called asking for results of labs work done yesterday. Please call pt @ [phone]

## 2012-07-08 NOTE — Telephone Encounter (Signed)
[name] had call from pt regarding lab results and he wanted to know his creatinine. I reviewed and called pt. Pt was upset that creatinine had not been checked on the 30th. [name] was demanding and demonstrated unreasonable behavior. He did not understand that I was not responsible for this issue, he was insulting, ignored repeated apologies, repeated the same demands and questions over and over.    That lab visit was requested by his [facility] MD to monitor new med and the only tests requested her LFT's and GGt and CBC.   We were able to add on a creatinine.  Spoke to Dr Tonny Branch who wants to keep [name] as a pt until he graduates. Pt is pleasant to Dr Demetrius Charity so will try to keep all conversations btx Dr Demetrius Charity and [name].

## 2012-07-08 NOTE — Addendum Note (Signed)
Addended by: Bufford Spikes on: 07/08/2012 04:10 PM   Modules accepted: Orders

## 2012-07-08 NOTE — Telephone Encounter (Signed)
Dr Rogelia Boga talked with pt.

## 2012-07-09 ENCOUNTER — Emergency Department (HOSPITAL_COMMUNITY)
Admission: EM | Admit: 2012-07-09 | Discharge: 2012-07-09 | Disposition: A | Discharge status: Home / Self Care | Payer: Medicare Other | Attending: Emergency Medicine | Admitting: Emergency Medicine

## 2012-07-09 ENCOUNTER — Encounter (HOSPITAL_COMMUNITY): Payer: Self-pay | Admitting: Emergency Medicine

## 2012-07-09 DIAGNOSIS — Z8709 Personal history of other diseases of the respiratory system: Secondary | ICD-10-CM | POA: Insufficient documentation

## 2012-07-09 DIAGNOSIS — IMO0002 Reserved for concepts with insufficient information to code with codable children: Secondary | ICD-10-CM | POA: Insufficient documentation

## 2012-07-09 DIAGNOSIS — Z8659 Personal history of other mental and behavioral disorders: Secondary | ICD-10-CM | POA: Insufficient documentation

## 2012-07-09 DIAGNOSIS — I10 Essential (primary) hypertension: Secondary | ICD-10-CM | POA: Insufficient documentation

## 2012-07-09 DIAGNOSIS — I509 Heart failure, unspecified: Secondary | ICD-10-CM | POA: Insufficient documentation

## 2012-07-09 DIAGNOSIS — F3289 Other specified depressive episodes: Secondary | ICD-10-CM | POA: Insufficient documentation

## 2012-07-09 DIAGNOSIS — Z8679 Personal history of other diseases of the circulatory system: Secondary | ICD-10-CM | POA: Insufficient documentation

## 2012-07-09 DIAGNOSIS — F329 Major depressive disorder, single episode, unspecified: Secondary | ICD-10-CM | POA: Insufficient documentation

## 2012-07-09 DIAGNOSIS — G4730 Sleep apnea, unspecified: Secondary | ICD-10-CM | POA: Insufficient documentation

## 2012-07-09 DIAGNOSIS — K1370 Unspecified lesions of oral mucosa: Secondary | ICD-10-CM | POA: Insufficient documentation

## 2012-07-09 DIAGNOSIS — Z87891 Personal history of nicotine dependence: Secondary | ICD-10-CM | POA: Insufficient documentation

## 2012-07-09 DIAGNOSIS — I2510 Atherosclerotic heart disease of native coronary artery without angina pectoris: Secondary | ICD-10-CM | POA: Insufficient documentation

## 2012-07-09 DIAGNOSIS — G7000 Myasthenia gravis without (acute) exacerbation: Secondary | ICD-10-CM | POA: Insufficient documentation

## 2012-07-09 DIAGNOSIS — Z8739 Personal history of other diseases of the musculoskeletal system and connective tissue: Secondary | ICD-10-CM | POA: Insufficient documentation

## 2012-07-09 DIAGNOSIS — Z8719 Personal history of other diseases of the digestive system: Secondary | ICD-10-CM | POA: Insufficient documentation

## 2012-07-09 DIAGNOSIS — Z79899 Other long term (current) drug therapy: Secondary | ICD-10-CM | POA: Insufficient documentation

## 2012-07-09 DIAGNOSIS — Z794 Long term (current) use of insulin: Secondary | ICD-10-CM | POA: Insufficient documentation

## 2012-07-09 DIAGNOSIS — Z8669 Personal history of other diseases of the nervous system and sense organs: Secondary | ICD-10-CM | POA: Insufficient documentation

## 2012-07-09 DIAGNOSIS — E785 Hyperlipidemia, unspecified: Secondary | ICD-10-CM | POA: Insufficient documentation

## 2012-07-09 DIAGNOSIS — E119 Type 2 diabetes mellitus without complications: Secondary | ICD-10-CM | POA: Insufficient documentation

## 2012-07-09 DIAGNOSIS — Z8619 Personal history of other infectious and parasitic diseases: Secondary | ICD-10-CM | POA: Insufficient documentation

## 2012-07-09 MED ORDER — MAGIC MOUTHWASH W/LIDOCAINE: 10.0000 mL | Freq: Four times a day (QID) | ORAL | Status: DC | PRN

## 2012-07-09 NOTE — ED Provider Notes (Signed)
History     CSN: [id number]  Arrival date & time 07/09/12  1342   First MD Initiated Contact with Patient 07/09/12 1354      Chief Complaint  Patient presents with  . Oral Swelling    (Consider location/radiation/quality/duration/timing/severity/associated sxs/prior treatment) HPI  [name] is a 67 y.o. male complaining of pain to the intraoral mucosa after burning himself while eating the plate of hot spaghetti several days ago. He states that the pain is worse with eating spicy food. Pain is 7/10, not taken any pain medication.  Past Medical History  Diagnosis Date  . Sinus tachycardia 2006    a. Nonobstructive CAD by cath 2013. b. s/p atrial tachycardia ablation 08/2011 (sinus node modification)  . Orthostatic hypotension   . Unspecified essential hypertension   . Hyperlipidemia   . Cervical spondylosis with radiculopathy   . Syrinx     T5-T6  . Cord compression     Cord compression syndrome C5-C6, C6-C7  . ADD (attention deficit disorder)   . Decreased libido   . Depression   . DM (diabetes mellitus)     adult onset  . Pulmonary nodule, left     Evaluated 04/2011 by pulmonology - felt to be benign granuloma  . Peripheral edema   . 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 LV dysfunction     By echo 2013 at [facility] - reported EF 45% per pt  . Colitis     with surface exudate  . Hemorrhoids     small  . Pseudomembranous colitis   . Gastritis   . Clostridium difficile infection 02/15/2012  . Shortness of breath   . CHF (congestive heart failure)   . Right bundle branch block and left posterior fascicular block   . Sleep apnea     occassionally uses cpap  . 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 Thymectomy april 2012  . Colonoscopy 11/13/2011    Procedure: COLONOSCOPY;  Surgeon: Theda Belfast, MD;  Location: [facility];  Service: Endoscopy;   Laterality: N/A;  . Cardiac catheterization   . Esophagogastroduodenoscopy 05/13/2012    Procedure: ESOPHAGOGASTRODUODENOSCOPY (EGD);  Surgeon: Iva Boop, MD;  Location: [facility];  Service: Endoscopy;  Laterality: N/A;  needs PAT w/ dx of Myastenia gravis and multiple meds. Pt requested lidocaine to numb IV site prior to start.  Very needle phobic.    Family History  Problem Relation Age of Onset  . Breast cancer Mother   . Other Father     Beck's Sarcoid  . Colon cancer Neg Hx   . Prostate cancer Neg Hx     History  Substance Use Topics  . Smoking status: Former Smoker -- 2.5 packs/day for 30 years    Quit date: 06/09/1991  . Smokeless tobacco: Never Used  . Alcohol Use: No      Review of Systems  Constitutional: Negative for fever.  Respiratory: Negative for shortness of breath.   Cardiovascular: Negative for chest pain.  Gastrointestinal: Negative for nausea, vomiting, abdominal pain and diarrhea.  All other systems reviewed and are negative.    Allergies  Aminoglycosides; Beta adrenergic blockers; Calcium channel blockers; Metoprolol; Neuromuscular blocking agents; Other; Penicillins; and Quinine derivatives  Home Medications   Current Outpatient Rx  Name  Route  Sig  Dispense  Refill  .  BENAZEPRIL HCL 40 MG PO TABS   Oral   Take 40 mg by mouth daily before breakfast.         . FUROSEMIDE 40 MG PO TABS   Oral   Take 3 tablets (120 mg total) by mouth 2 (two) times daily.   180 tablet   2   . INSULIN GLARGINE 100 UNIT/ML [place] SOLN   Subcutaneous   Inject 20-50 Units into the skin at bedtime.         Marland Kitchen METOLAZONE 5 MG PO TABS   Oral   Take 1 tablet by mouth daily.          Marland Kitchen MYCOPHENOLATE MOFETIL 500 MG PO TABS   Oral   Take 500 mg by mouth 2 (two) times daily.         . OXYCODONE HCL 5 MG PO TABS   Oral   Take 5 mg by mouth every 4 (four) hours as needed. For pain         . PANTOPRAZOLE SODIUM 40 MG PO TBEC   Oral   Take 40 mg by  mouth 2 (two) times daily.          Marland Kitchen POTASSIUM CHLORIDE CRYS ER 20 MEQ PO TBCR   Oral   Take 20 mEq by mouth 2 (two) times daily at 10 AM and 5 PM. With the lasix when he thinks about it         . PREDNISONE 5 MG PO TABS   Oral   Take 15 mg by mouth daily.         Marland Kitchen PREDNISONE 5 MG PO TABS   Oral   Take 15 mg by mouth daily.         Marland Kitchen PYRIDOSTIGMINE BROMIDE 60 MG PO TABS   Oral   Take 60 mg by mouth every 3 (three) hours. Medication starts to wear off closer to 3 hours         . SPIRONOLACTONE 25 MG PO TABS   Oral   Take 25 mg by mouth daily.         Marland Kitchen ZOLPIDEM TARTRATE 10 MG PO TABS   Oral   Take 1 tablet (10 mg total) by mouth at bedtime.   30 tablet   5     BP 128/70  Pulse 67  Temp 97.5 F (36.4 C) (Oral)  Resp 18  SpO2 94%  Physical Exam  Nursing note and vitals reviewed. Constitutional: He is oriented to person, place, and time. He appears well-developed and well-nourished. No distress.  HENT:  Head: Normocephalic.       partial thickness burns to bilateral [place] mucosa  Eyes: Conjunctivae normal and EOM are normal. Pupils are equal, round, and reactive to light.  Cardiovascular: Normal rate.   Pulmonary/Chest: Effort normal and breath sounds normal. No stridor. No respiratory distress. He has no wheezes. He has no rales. He exhibits no tenderness.  Abdominal: Soft. Bowel sounds are normal.  Musculoskeletal: Normal range of motion. He exhibits edema.       Significant 3+ pitting edema to Bilateral LE  Neurological: He is alert and oriented to person, place, and time.  Psychiatric: He has a normal mood and affect.    ED Course  Procedures (including critical care time)  Labs Reviewed - No data to display No results found.   1. Oral mucosal lesion       MDM      Filed Vitals:   07/09/12 1348  BP: 128/70  Pulse: 67  Temp: 97.5 F (36.4 C)  TempSrc: Oral  Resp: 18  SpO2: 94%     Pt verbalized understanding and agrees  with care plan. Outpatient follow-up and return precautions given.    New Prescriptions   ALUM & MAG HYDROXIDE-SIMETH (MAGIC MOUTHWASH W/LIDOCAINE) SOLN    Take 10 mLs by mouth 4 (four) times daily as needed (pain). Swish and spit.         Wynetta Emery, PA-C 07/09/12 2250

## 2012-07-09 NOTE — ED Notes (Signed)
Pt. Stated, My mouth is so raw from I dont know what.  It started 2 days ago, and i can't hardly eat.

## 2012-07-10 NOTE — ED Provider Notes (Signed)
Medical screening examination/treatment/procedure(s) were performed by non-physician practitioner and as supervising physician I was immediately available for consultation/collaboration.   Juliocesar Blasius E Zury Fazzino, MD 07/10/12 1449 

## 2012-07-12 ENCOUNTER — Encounter: Payer: Self-pay | Admitting: Internal Medicine

## 2012-07-12 ENCOUNTER — Telehealth: Payer: Self-pay | Admitting: Internal Medicine

## 2012-07-12 ENCOUNTER — Ambulatory Visit (INDEPENDENT_AMBULATORY_CARE_PROVIDER_SITE_OTHER): Payer: Medicare Other | Admitting: Internal Medicine

## 2012-07-12 VITALS — BP 153/72 | HR 81 | Temp 97.7°F | Ht 67.0 in | Wt 168.2 lb

## 2012-07-12 DIAGNOSIS — E876 Hypokalemia: Secondary | ICD-10-CM

## 2012-07-12 DIAGNOSIS — R17 Unspecified jaundice: Secondary | ICD-10-CM

## 2012-07-12 DIAGNOSIS — E1165 Type 2 diabetes mellitus with hyperglycemia: Secondary | ICD-10-CM

## 2012-07-12 DIAGNOSIS — I951 Orthostatic hypotension: Secondary | ICD-10-CM

## 2012-07-12 DIAGNOSIS — Z79899 Other long term (current) drug therapy: Secondary | ICD-10-CM

## 2012-07-12 DIAGNOSIS — B370 Candidal stomatitis: Secondary | ICD-10-CM | POA: Insufficient documentation

## 2012-07-12 LAB — BASIC METABOLIC PANEL
Chloride: 98 mEq/L (ref 96–112)
Glucose, Bld: 288 mg/dL — ABNORMAL HIGH (ref 70–99)
Potassium: 5.2 mEq/L (ref 3.5–5.3)
Sodium: 134 mEq/L — ABNORMAL LOW (ref 135–145)

## 2012-07-12 LAB — POCT GLYCOSYLATED HEMOGLOBIN (HGB A1C): Hemoglobin A1C: 11.9

## 2012-07-12 MED ORDER — METFORMIN HCL 500 MG PO TABS: 500.0000 mg | ORAL_TABLET | Freq: Two times a day (BID) | ORAL | Status: DC

## 2012-07-12 NOTE — Assessment & Plan Note (Signed)
Diabetes is poorly controlled and patient has no insight. Hemoglobin A1c today is 11.9. He has been not using Lantus since more than 2 weeks. I doubt he was compliant prior to this. He is very reluctant to use Lantus anymore. I was stopped home metformin although he endorses some concerns with his medication in some studies he read. He noted that he would give it a try and will does state depending on how he tolerates it: He may take both of the tablets in the morning or perhaps all of them from the evening. I will evaluate the patient in 3 weeks and increase his metformin dosage gradually.

## 2012-07-12 NOTE — Assessment & Plan Note (Signed)
Continued Magic mouthwash at this point. If the lesions does not improve during the next office visit I would consider Diflucan. The main issue is that his hemoglobin A1c is from 11.9

## 2012-07-12 NOTE — Telephone Encounter (Signed)
INTERNAL MEDICINE RESIDENCY PROGRAM After-Hours Telephone Call    Reason for call:   I received a call from [name] on 07/12/2012 at 6:13 PM. Patient states that he received a phone call from the clinic with regards to his follow up appointment.    Pertinent Data:   He was evaluated by Dr. Loistine Ward at 1030 am this morning for uncontrolled DM, and his CBG was 286. His BMP is still pending.      Assessment / Plan / Recommendations:   I reinsured patient that he has a f/u appt with Dr. Tonny Ward on 07/15/12 and his blood work (BMP) is pending. Patient understands above information.     Dede Query, MD   07/12/2012, 6:13 PM

## 2012-07-12 NOTE — Progress Notes (Signed)
Subjective:   Patient ID: [name] male   DOB: Sep 12, 1945 67 y.o.   MRN: [id number]  HPI: [name] is a 67 y.o. male with past medical history significant as outlined below who presented to the clinic for followup visit from the ED where he was evaluated for mouth sores. She reported it started off last week and got worse therefore he went to the emergency room where he received Magic mouthwash. He noted that it has somewhat improved but was wondering how long it'll take.  Hemoglobin A1c was obtained today and was notable for 11.9 . After reviewing his medication list the patient reports that he has not used Lantus for more than 2 weeks. He gave himself a shot and since then he has significant pain in that area. He further noted that studies in [place] applied that it is not good to use Lantus if you have myasthenia gravis. He would prefer not to take any insulin especially Lantus since that's the worst medication. Furthermore he endorses feeling dizzy, funny and nausea when his blood sugars are below 260. At that time he wouldn't eat candy bars to bring his sugars up. The best he feels when his blood sugars are around 300.     Past Medical History  Diagnosis Date  . Sinus tachycardia 2006    a. Nonobstructive CAD by cath 2013. b. s/p atrial tachycardia ablation 08/2011 (sinus node modification)  . Orthostatic hypotension   . Unspecified essential hypertension   . Hyperlipidemia   . Cervical spondylosis with radiculopathy   . Syrinx     T5-T6  . Cord compression     Cord compression syndrome C5-C6, C6-C7  . ADD (attention deficit disorder)   . Decreased libido   . Depression   . DM (diabetes mellitus)     adult onset  . Pulmonary nodule, left     Evaluated 04/2011 by pulmonology - felt to be benign granuloma  . Peripheral edema   . 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 LV dysfunction     By echo 2013 at [facility] - reported EF 45% per pt   . Colitis     with surface exudate  . Hemorrhoids     small  . Pseudomembranous colitis   . Gastritis   . Clostridium difficile infection 02/15/2012  . Shortness of breath   . CHF (congestive heart failure)   . Right bundle branch block and left posterior fascicular block   . Sleep apnea     occassionally uses cpap  . 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 Alum & Mag Hydroxide-Simeth (MAGIC MOUTHWASH W/LIDOCAINE) SOLN Take 10 mLs by mouth 4 (four) times daily as needed (pain). Swish and spit.  250 mL  0  . benazepril (LOTENSIN) 40 MG tablet Take 40 mg by mouth daily before breakfast.      . furosemide (LASIX) 40 MG tablet Take 3 tablets (120 mg total) by mouth 2 (two) times daily.  180 tablet  2  . insulin glargine (LANTUS) 100 UNIT/ML injection Inject 20-50 Units into the skin at bedtime.      . metolazone (ZAROXOLYN) 5 MG tablet Take 1 tablet by mouth daily.       . mycophenolate (CELLCEPT) 500 MG tablet Take 500 mg by mouth 2 (two) times daily.      Marland Kitchen  oxyCODONE (OXY IR/ROXICODONE) 5 MG immediate release tablet Take 5 mg by mouth every 4 (four) hours as needed. For pain      . pantoprazole (PROTONIX) 40 MG tablet Take 40 mg by mouth 2 (two) times daily.       . potassium chloride SA (K-DUR,KLOR-CON) 20 MEQ tablet Take 20 mEq by mouth 2 (two) times daily at 10 AM and 5 PM. With the lasix when he thinks about it      . predniSONE (DELTASONE) 5 MG tablet Take 15 mg by mouth daily.      . predniSONE (DELTASONE) 5 MG tablet Take 15 mg by mouth daily.      Marland Kitchen pyridostigmine (MESTINON) 60 MG tablet Take 60 mg by mouth every 3 (three) hours. Medication starts to wear off closer to 3 hours      . spironolactone (ALDACTONE) 25 MG tablet Take 25 mg by mouth daily.      Marland Kitchen zolpidem (AMBIEN) 10 MG tablet Take 1 tablet (10 mg total) by mouth at bedtime.  30 tablet  5  . [DISCONTINUED]  pantoprazole (PROTONIX) 40 MG tablet Take 1 tablet (40 mg total) by mouth daily.  30 tablet  2   Family History  Problem Relation Age of Onset  . Breast cancer Mother   . Other Father     Beck's Sarcoid  . Colon cancer Neg Hx   . Prostate cancer Neg Hx    History   Social History  . Marital Status: Single    Spouse Name: N/A    Number of Children: 1  . Years of Education: N/A   Occupational History  . Retired     Surveyor, minerals   Social History Main Topics  . Smoking status: Former Smoker -- 2.5 packs/day for 30 years    Quit date: 06/09/1991  . Smokeless tobacco: Never Used  . Alcohol Use: No  . Drug Use: No  . Sexually Active: None   Other Topics Concern  . 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  Respiratory: Denies SOB, DOE, cough, chest tightness,  and wheezing.   Cardiovascular: Denies chest pain, palpitations and leg swelling.  Gastrointestinal: Denies nausea, vomiting, abdominal pain except that area where he gave him the shots,  denies diarrhea, constipation  Neurological: Denies dizziness,light-headedness except when his sugars are low.    Objective:  Physical Exam: Filed Vitals:   07/12/12 1102  BP: 153/72  Pulse: 81  Temp: 97.7 F (36.5 C)  TempSrc: Oral  Height: 5\' 7"  (1.702 m)  Weight: 168 lb 3.2 oz (76.295 kg)  SpO2: 99%   Constitutional: Vital signs reviewed.  Patient is a well-developed and well-nourished male in no acute distress and cooperative with exam. Alert and oriented x3.  Mouth: White plaques present in the left vocal in right buccal area as well as the anterior aspect of lip.  Neck: Supple,  Cardiovascular: RRR, S1 normal, S2 normal, no MRG, pulses symmetric and intact bilaterally Pulmonary/Chest: CTAB, no wheezes,  rales, or rhonchi Abdominal: Soft. Non-tender, non-distended, bowel sounds are normal Hematology: no cervical adenopathy.  Neurological: A&O x3,

## 2012-07-13 ENCOUNTER — Other Ambulatory Visit (INDEPENDENT_AMBULATORY_CARE_PROVIDER_SITE_OTHER): Payer: Medicare Other

## 2012-07-13 DIAGNOSIS — G7000 Myasthenia gravis without (acute) exacerbation: Secondary | ICD-10-CM

## 2012-07-13 DIAGNOSIS — Z79899 Other long term (current) drug therapy: Secondary | ICD-10-CM

## 2012-07-13 LAB — CBC
HCT: 37.5 % — ABNORMAL LOW (ref 39.0–52.0)
MCV: 80 fL (ref 78.0–100.0)
RBC: 4.69 MIL/uL (ref 4.22–5.81)
WBC: 9 10*3/uL (ref 4.0–10.5)

## 2012-07-13 LAB — HEPATIC FUNCTION PANEL
ALT: 8 U/L (ref 0–53)
Alkaline Phosphatase: 95 U/L (ref 39–117)
Indirect Bilirubin: 0.8 mg/dL (ref 0.0–0.9)
Total Protein: 5.8 g/dL — ABNORMAL LOW (ref 6.0–8.3)

## 2012-07-13 NOTE — Progress Notes (Signed)
Lab results faxed to Dr Clarisa Kindred, [facility], Dept of Neurology [phone] at 13:16p 07-13-2012 Called and spoke with [facility].  She confirmed receiving fax.  Alric Quan, PBT

## 2012-07-14 ENCOUNTER — Other Ambulatory Visit: Payer: Self-pay | Admitting: Internal Medicine

## 2012-07-15 ENCOUNTER — Encounter: Payer: Self-pay | Admitting: Internal Medicine

## 2012-07-15 ENCOUNTER — Ambulatory Visit (INDEPENDENT_AMBULATORY_CARE_PROVIDER_SITE_OTHER): Payer: Medicare Other | Admitting: Internal Medicine

## 2012-07-15 VITALS — BP 145/67 | HR 72 | Temp 97.0°F | Ht 67.0 in | Wt 167.8 lb

## 2012-07-15 DIAGNOSIS — G4700 Insomnia, unspecified: Secondary | ICD-10-CM

## 2012-07-15 DIAGNOSIS — M5412 Radiculopathy, cervical region: Secondary | ICD-10-CM

## 2012-07-15 DIAGNOSIS — F329 Major depressive disorder, single episode, unspecified: Secondary | ICD-10-CM

## 2012-07-15 DIAGNOSIS — E1165 Type 2 diabetes mellitus with hyperglycemia: Secondary | ICD-10-CM

## 2012-07-15 MED ORDER — MIRTAZAPINE 15 MG PO TABS: 15.0000 mg | ORAL_TABLET | Freq: Every day | ORAL | Status: DC

## 2012-07-15 MED ORDER — OXYCODONE HCL 5 MG PO TABS: 5.0000 mg | ORAL_TABLET | ORAL | Status: DC | PRN

## 2012-07-15 NOTE — Patient Instructions (Addendum)
1.  Start Remeron 15 mg.  Take 1 tablet every night for the sleep  - It is okay to take the Oxycodone with the medication.  - If after an hour you have not fallen asleep, cut the Ambien in half and take 5 mg.  2.  Continue all of your other medication as prescribed.  3.  Follow up in 2-4 weeks to see how you are doing.

## 2012-07-15 NOTE — Progress Notes (Signed)
Subjective:   Patient ID: JAVARUS DORNER male   DOB: [birth year] 67 y.o.   MRN: [id number]  HPI: Mr.Ishan Reece Agar Gottwald is a 67 y.o. man who presents to clinic today for follow up on his chronic medical problems including cervical spondylosis, diabetes, and insomnia.  See Problem focused Assessment and Plan for full details of his chronic medical conditions.   He states that over the last month he has noticed that he is crying more and becoming more hopeless.  He states that he continues to have problems sleeping even with the [place] as well as feeling worthless and overwhelmed.  He denies SI, or HI but states that "I'm ready to go if it is my time."    Past Medical History  Diagnosis Date  . Sinus tachycardia 2006    a. Nonobstructive CAD by cath 2013. b. s/p atrial tachycardia ablation 08/2011 (sinus node modification)  . Orthostatic hypotension   . Unspecified essential hypertension   . Hyperlipidemia   . Cervical spondylosis with radiculopathy   . Syrinx     T5-T6  . Cord compression     Cord compression syndrome C5-C6, C6-C7  . ADD (attention deficit disorder)   . Decreased libido   . Depression   . DM (diabetes mellitus)     adult onset  . Pulmonary nodule, left     Evaluated 04/2011 by pulmonology - felt to be benign granuloma  . Peripheral edema   . 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 LV dysfunction     By echo 2013 at [facility] - reported EF 45% per pt  . Colitis     with surface exudate  . Hemorrhoids     small  . Pseudomembranous colitis   . Gastritis   . Clostridium difficile infection 02/15/2012  . Shortness of breath   . CHF (congestive heart failure)   . Right bundle branch block and left posterior fascicular block   . Sleep apnea     occassionally uses cpap  . 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 Alum & Mag  Hydroxide-Simeth (MAGIC MOUTHWASH W/LIDOCAINE) SOLN Take 10 mLs by mouth 4 (four) times daily as needed (pain). Swish and spit.  250 mL  0  . benazepril (LOTENSIN) 40 MG tablet Take 40 mg by mouth daily before breakfast.      . furosemide (LASIX) 40 MG tablet Take 3 tablets (120 mg total) by mouth 2 (two) times daily.  180 tablet  2  . metFORMIN (GLUCOPHAGE) 500 MG tablet Take 1 tablet (500 mg total) by mouth 2 (two) times daily with a meal.  60 tablet  0  . metolazone (ZAROXOLYN) 5 MG tablet Take 1 tablet by mouth daily.       . mycophenolate (CELLCEPT) 500 MG tablet Take 500 mg by mouth 2 (two) times daily.      Marland Kitchen oxyCODONE (OXY IR/ROXICODONE) 5 MG immediate release tablet Take 5 mg by mouth every 4 (four) hours as needed. For pain      . pantoprazole (PROTONIX) 40 MG tablet Take 1 tablet (40 mg total) by mouth daily.  30 tablet  6  . potassium chloride SA (K-DUR,KLOR-CON) 20 MEQ tablet Take 20 mEq by mouth 2 (two) times daily at 10 AM and 5 PM. With the lasix when he thinks about it      .  predniSONE (DELTASONE) 5 MG tablet Take 15 mg by mouth daily.      Marland Kitchen pyridostigmine (MESTINON) 60 MG tablet Take 60 mg by mouth every 3 (three) hours. Medication starts to wear off closer to 3 hours      . spironolactone (ALDACTONE) 25 MG tablet Take 25 mg by mouth daily.      Marland Kitchen zolpidem (AMBIEN) 10 MG tablet Take 1 tablet (10 mg total) by mouth at bedtime.  30 tablet  5  . [DISCONTINUED] pantoprazole (PROTONIX) 40 MG tablet Take 1 tablet (40 mg total) by mouth daily.  30 tablet  2   Family History  Problem Relation Age of Onset  . Breast cancer Mother   . Other Father     Beck's Sarcoid  . Colon cancer Neg Hx   . Prostate cancer Neg Hx    History   Social History  . Marital Status: Single    Spouse Name: N/A    Number of Children: 1  . Years of Education: N/A   Occupational History  . Retired     Surveyor, minerals   Social History Main Topics  . Smoking status: Former Smoker -- 2.5 packs/day for 30  years    Quit date: 06/09/1991  . Smokeless tobacco: Never Used  . Alcohol Use: No  . Drug Use: No  . Sexually Active: None   Other Topics Concern  . 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  Denies fever, chills, diaphoresis, and fatigue.  HEENT: Denies photophobia, eye pain, redness, hearing loss, ear pain, congestion, sore throat, rhinorrhea, sneezing, mouth sores, trouble swallowing, neck pain, neck stiffness and tinnitus.   Respiratory: Positive for SOB. Denies DOE, cough, chest tightness,  and wheezing.   Cardiovascular: Positive for  leg swelling. Denies chest pain, palpitations.  Gastrointestinal: Denies nausea, vomiting, abdominal pain, diarrhea, constipation, blood in stool and abdominal distention.  Genitourinary: Denies dysuria, urgency, frequency, hematuria, flank pain and difficulty urinating.  Musculoskeletal: Denies myalgias, back pain, joint swelling, arthralgias and gait problem.  Skin: Denies pallor, rash and wound.  Neurological: Denies dizziness, seizures, syncope, weakness, light-headedness, numbness and headaches.  Hematological: Denies adenopathy. Easy bruising, personal or family bleeding history  Psychiatric/Behavioral: Denies suicidal ideation, mood changes, confusion, nervousness, sleep disturbance and agitation  Objective:  Physical Exam: Filed Vitals:   07/15/12 1536  BP: 145/67  Pulse: 72  Temp: 97 F (36.1 C)  TempSrc: Oral  Height: 5\' 7"  (1.702 m)  Weight: 167 lb 12.8 oz (76.114 kg)  SpO2: 99%   Constitutional: Vital signs reviewed.  Patient is a well-developed and well-nourished elderly appearing man in mild distress.  Cooperative with exam. Alert and oriented x3.  Head: Normocephalic and atraumatic Ear: TM normal bilaterally Mouth: no erythema or exudates,  MMM Eyes: PERRL, EOMI, conjunctivae normal, No scleral icterus.  Neck: Supple, Trachea midline normal ROM, No JVD, mass, thyromegaly, or carotid bruit present.  Cardiovascular: RRR, S1 normal, S2 normal, no MRG, pulses symmetric and intact bilaterally Pulmonary/Chest: CTAB, no wheezes, rales, or rhonchi Abdominal: Soft. Non-tender, non-distended, bowel sounds are normal, no masses, organomegaly, or guarding present.  GU: no CVA tenderness Musculoskeletal: No joint deformities, erythema, or stiffness, ROM full and no nontender Hematology: no cervical, inginal, or axillary adenopathy.  Neurological: A&O x3, Strength is normal and symmetric bilaterally, cranial nerve II-XII are  grossly intact, no focal motor deficit, sensory intact to light touch bilaterally.  Skin: +3 pitting edema to the legs bilaterally.  Warm, dry and intact. No rash, cyanosis, or clubbing.  Psychiatric: depressed tearful mood and labile affect. speech is goal directed and behavior is normal. Judgment and insight are mildly impaired.  Thought content normal. Cognition and memory are normal.   Assessment & Plan:

## 2012-07-20 ENCOUNTER — Telehealth: Payer: Self-pay | Admitting: Licensed Clinical Social Worker

## 2012-07-20 NOTE — Telephone Encounter (Signed)
CSW emailed and send hard copy letter to [name]: [name],  Step 1. Please schedule a hearing test at:   [facility] [address] [place], [place] [address] [phone]  [facility] accepts your Medicare insurance.  Your co-pay will be approx. $10-$20.     Step 2.  Once [facility] determines if you would benefit from a hearing aid.  Schedule and face to face interview with:  [facility] of [facility] [address], [address] [place] [address] [phone]  You will need to apply during this face to face (proof of address and income, hearing test)

## 2012-07-22 ENCOUNTER — Telehealth: Payer: Self-pay | Admitting: Internal Medicine

## 2012-07-22 NOTE — Telephone Encounter (Signed)
INTERNAL MEDICINE RESIDENCY PROGRAM After-Hours Telephone Call    Reason for call:   I received a call from Mr. BAYLEE MCCORKEL on 07/22/2012 at 7:40 PM.  Pharmacist Thayer Ohm from [facility] at [facility] states that patient would like to have early refill of his Ambien Rx.     Pertinent Data:   Patient is on Ambien 10 mg QHS as needed. Last refill of # 30 tab was on 07/01/12. Patient states that he ran out of the pills and would like to have early refills and higher dose.     Assessment / Plan / Recommendations:   I will not give the refill prescription for Ambien tonight. I will route this note to Dr. Tonny Branch.      Dede Query, MD   07/22/2012, 7:40 PM

## 2012-07-25 ENCOUNTER — Other Ambulatory Visit (INDEPENDENT_AMBULATORY_CARE_PROVIDER_SITE_OTHER): Payer: Medicare Other

## 2012-07-25 DIAGNOSIS — G7000 Myasthenia gravis without (acute) exacerbation: Secondary | ICD-10-CM

## 2012-07-25 DIAGNOSIS — Z79899 Other long term (current) drug therapy: Secondary | ICD-10-CM

## 2012-07-25 LAB — HEPATIC FUNCTION PANEL
ALT: 10 U/L (ref 0–53)
Total Protein: 5.8 g/dL — ABNORMAL LOW (ref 6.0–8.3)

## 2012-07-25 LAB — CBC
HCT: 34.5 % — ABNORMAL LOW (ref 39.0–52.0)
MCH: 28 pg (ref 26.0–34.0)
MCHC: 35.1 g/dL (ref 30.0–36.0)
MCV: 79.9 fL (ref 78.0–100.0)
RDW: 13.1 % (ref 11.5–15.5)

## 2012-07-25 LAB — GAMMA GT: GGT: 39 U/L (ref 7–51)

## 2012-07-25 NOTE — Telephone Encounter (Signed)
I spoke with [name] about this at his last appointment.  He had been taking 1 tablet and then a second tablet 4 hours later to try to get more sleep.  We had changed his medication at his last visit.  I will try to discuss with him today about his sleeping medication but I agree that a dose increase with the ambien or an early refill is inappropriate.

## 2012-07-25 NOTE — Progress Notes (Signed)
Results faxed to [facility] Dr. Clarisa Kindred by [facility] 07/25/12 1:25pm Confirmed by Waynetta Sandy at [facility] at 2:40pm fax received

## 2012-07-29 ENCOUNTER — Telehealth: Payer: Self-pay | Admitting: *Deleted

## 2012-07-29 ENCOUNTER — Other Ambulatory Visit (INDEPENDENT_AMBULATORY_CARE_PROVIDER_SITE_OTHER): Payer: Medicare Other

## 2012-07-29 LAB — CBC
MCV: 81.3 fL (ref 78.0–100.0)
Platelets: 248 10*3/uL (ref 150–400)
RDW: 13.5 % (ref 11.5–15.5)
WBC: 9.4 10*3/uL (ref 4.0–10.5)

## 2012-07-29 LAB — HEPATIC FUNCTION PANEL
Bilirubin, Direct: 0.1 mg/dL (ref 0.0–0.3)
Indirect Bilirubin: 0.5 mg/dL (ref 0.0–0.9)
Total Protein: 5.5 g/dL — ABNORMAL LOW (ref 6.0–8.3)

## 2012-07-29 NOTE — Telephone Encounter (Signed)
Pt calls for lab appt this am, also would like

## 2012-07-29 NOTE — Progress Notes (Signed)
Lab results faxed to [facility] at Dr Glennon Hamilton office.  [facility] Dept. Of Neurology  [phone] [facility] confirmed receiving the fax. 1505p  Alric Quan, PBT 07-29-2012 1505p

## 2012-08-04 ENCOUNTER — Other Ambulatory Visit (INDEPENDENT_AMBULATORY_CARE_PROVIDER_SITE_OTHER): Payer: Medicare Other

## 2012-08-04 LAB — HEPATIC FUNCTION PANEL
Alkaline Phosphatase: 127 U/L — ABNORMAL HIGH (ref 39–117)
Indirect Bilirubin: 0.4 mg/dL (ref 0.0–0.9)
Total Bilirubin: 0.5 mg/dL (ref 0.3–1.2)
Total Protein: 6.2 g/dL (ref 6.0–8.3)

## 2012-08-04 LAB — CBC
MCV: 79.8 fL (ref 78.0–100.0)
Platelets: 277 10*3/uL (ref 150–400)
RBC: 4.35 MIL/uL (ref 4.22–5.81)
WBC: 8.2 10*3/uL (ref 4.0–10.5)

## 2012-08-04 NOTE — Progress Notes (Signed)
08-04-2012 lab results faxed to Dr Clarisa Kindred, Dept of Neurology, [facility].  [phone], Spoke to [place] and she confirmed receiving the fax at 1450p  08-04-2012  1455pTracey Deaveon Schoen, PBT

## 2012-08-08 ENCOUNTER — Ambulatory Visit (INDEPENDENT_AMBULATORY_CARE_PROVIDER_SITE_OTHER): Payer: Medicaid Other | Admitting: Internal Medicine

## 2012-08-08 ENCOUNTER — Encounter: Payer: Self-pay | Admitting: Internal Medicine

## 2012-08-08 VITALS — BP 152/77 | HR 85 | Temp 97.5°F | Ht 67.0 in | Wt 178.4 lb

## 2012-08-08 DIAGNOSIS — IMO0001 Reserved for inherently not codable concepts without codable children: Secondary | ICD-10-CM

## 2012-08-08 DIAGNOSIS — F329 Major depressive disorder, single episode, unspecified: Secondary | ICD-10-CM

## 2012-08-08 DIAGNOSIS — M5412 Radiculopathy, cervical region: Secondary | ICD-10-CM

## 2012-08-08 DIAGNOSIS — I10 Essential (primary) hypertension: Secondary | ICD-10-CM

## 2012-08-08 DIAGNOSIS — G4700 Insomnia, unspecified: Secondary | ICD-10-CM

## 2012-08-08 DIAGNOSIS — F3289 Other specified depressive episodes: Secondary | ICD-10-CM

## 2012-08-08 DIAGNOSIS — E1165 Type 2 diabetes mellitus with hyperglycemia: Secondary | ICD-10-CM

## 2012-08-08 MED ORDER — OXYCODONE HCL 5 MG PO TABS: 5.0000 mg | ORAL_TABLET | ORAL | Status: DC | PRN

## 2012-08-08 MED ORDER — MIRTAZAPINE 30 MG PO TABS: 30.0000 mg | ORAL_TABLET | Freq: Every day | ORAL | Status: DC

## 2012-08-08 NOTE — Patient Instructions (Signed)
1.  Increase the Remeron to 30 mg.  You can take 2 tablets of the 15 mg until you run out.    2.  Take the metformin 500 mg twice daily with meals.  If you feel woozy a small snack is fine  3.  Follow up with me in the end of March.

## 2012-08-08 NOTE — Progress Notes (Signed)
Subjective:   Patient ID: [name] male   DOB: Apr 01, 1946 67 y.o.   MRN: [id number]  HPI: [name] is a 67 y.o. man who presents to clinic today for follow up on his chronic medical conditions including diabetes, hypertension, insomnia, depression, and cervical radiculopathy.  See Problem focused Assessment and Plan for full details of his chronic medical conditions.   Past Medical History  Diagnosis Date  . Sinus tachycardia 2006    a. Nonobstructive CAD by cath 2013. b. s/p atrial tachycardia ablation 08/2011 (sinus node modification)  . Orthostatic hypotension   . Unspecified essential hypertension   . Hyperlipidemia   . Cervical spondylosis with radiculopathy   . Syrinx     T5-T6  . Cord compression     Cord compression syndrome C5-C6, C6-C7  . ADD (attention deficit disorder)   . Decreased libido   . Depression   . DM (diabetes mellitus)     adult onset  . Pulmonary nodule, left     Evaluated 04/2011 by pulmonology - felt to be benign granuloma  . Peripheral edema   . 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 LV dysfunction     By echo 2013 at [facility] - reported EF 45% per pt  . Colitis     with surface exudate  . Hemorrhoids     small  . Pseudomembranous colitis   . Gastritis   . Clostridium difficile infection 02/15/2012  . Shortness of breath   . CHF (congestive heart failure)   . Right bundle branch block and left posterior fascicular block   . Sleep apnea     occassionally uses cpap  . 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 Alum & Mag Hydroxide-Simeth (MAGIC MOUTHWASH W/LIDOCAINE) SOLN Take 10 mLs by mouth 4 (four) times daily as needed (pain). Swish and spit.  250 mL  0  . benazepril (LOTENSIN) 40 MG tablet Take 40 mg by mouth daily before breakfast.      . furosemide (LASIX) 40 MG tablet Take 3 tablets (120  mg total) by mouth 2 (two) times daily.  180 tablet  2  . metFORMIN (GLUCOPHAGE) 500 MG tablet Take 1 tablet (500 mg total) by mouth 2 (two) times daily with a meal.  60 tablet  0  . metolazone (ZAROXOLYN) 5 MG tablet Take 1 tablet by mouth daily.       . mirtazapine (REMERON) 15 MG tablet Take 1 tablet (15 mg total) by mouth at bedtime.  30 tablet  1  . mycophenolate (CELLCEPT) 500 MG tablet Take 500 mg by mouth 2 (two) times daily.      Marland Kitchen oxyCODONE (OXY IR/ROXICODONE) 5 MG immediate release tablet Take 1 tablet (5 mg total) by mouth every 4 (four) hours as needed. For pain  120 tablet  0  . pantoprazole (PROTONIX) 40 MG tablet Take 1 tablet (40 mg total) by mouth daily.  30 tablet  6  . potassium chloride SA (K-DUR,KLOR-CON) 20 MEQ tablet Take 20 mEq by mouth 2 (two) times daily at 10 AM and 5 PM. With the lasix when he thinks about it      . predniSONE (DELTASONE) 5 MG tablet Take 15 mg by mouth daily.      Marland Kitchen pyridostigmine (MESTINON) 60 MG tablet Take 60 mg by mouth every 3 (  three) hours. Medication starts to wear off closer to 3 hours      . spironolactone (ALDACTONE) 25 MG tablet Take 25 mg by mouth daily.      Marland Kitchen zolpidem (AMBIEN) 10 MG tablet Take 1 tablet (10 mg total) by mouth at bedtime.  30 tablet  5  . [DISCONTINUED] pantoprazole (PROTONIX) 40 MG tablet Take 1 tablet (40 mg total) by mouth daily.  30 tablet  2   No current facility-administered medications for this visit.   Family History  Problem Relation Age of Onset  . Breast cancer Mother   . Other Father     Beck's Sarcoid  . Colon cancer Neg Hx   . Prostate cancer Neg Hx    History   Social History  . Marital Status: Single    Spouse Name: N/A    Number of Children: 1  . Years of Education: N/A   Occupational History  . Retired     Surveyor, minerals   Social History Main Topics  . Smoking status: Former Smoker -- 2.50 packs/day for 30 years    Quit date: 06/09/1991  . Smokeless tobacco: Never Used  . Alcohol Use: No   . Drug Use: No  . Sexually Active: None   Other Topics Concern  . None   Social History Narrative   Has a single man's lifestyle and will remind anyone of it when asked.   Prefers to see only male physicians   Retired Surveyor, minerals               Review of Systems: A full 12 system ROS is negative except as noted in the HPI and A&P.   Objective:  Physical Exam: Filed Vitals:   08/08/12 1154  BP: 152/77  Pulse: 85  Temp: 97.5 F (36.4 C)  TempSrc: Oral  Height: 5\' 7"  (1.702 m)  Weight: 178 lb 6.4 oz (80.922 kg)  SpO2: 100%   Constitutional: Vital signs reviewed.  Patient is a well-developed and well-nourished elderly appearing man in no acute distress and cooperative with exam. Alert and oriented x3.  Head: Normocephalic and atraumatic Ear: TM normal bilaterally Mouth: no erythema or exudates, MMM Eyes: PERRL, EOMI, conjunctivae normal, No scleral icterus.  Neck: Supple, Trachea midline normal ROM, No JVD, mass, thyromegaly, or carotid bruit present.  Cardiovascular: RRR with occasional ectopic beats, S1 normal, S2 normal, no MRG, pulses symmetric and intact bilaterally Pulmonary/Chest: CTAB, no wheezes, rales, or rhonchi Abdominal: Soft. Mild periumbilical tenderness, non-distended, bowel sounds are normal, no masses, organomegaly, or guarding present.  GU: no CVA tenderness Musculoskeletal: No joint deformities, erythema, or stiffness, ROM full and no nontender Hematology: no cervical, inginal, or axillary adenopathy.  Neurological: A&O x3, Strength is normal and symmetric bilaterally, cranial nerve II-XII are grossly intact, no focal motor deficit, sensory intact to light touch bilaterally.  Skin: 3+ pitting edema to the knees bilaterally.  Warm, dry and intact. No rash, cyanosis, or clubbing.  Psychiatric: anxious mood and labile affect. speech is goal directed.  Behavior is anxious.  Judgment and insight appear poor.  Thought content normal. Cognition is intact and  memory are normal.   Assessment & Plan:

## 2012-08-11 ENCOUNTER — Other Ambulatory Visit (INDEPENDENT_AMBULATORY_CARE_PROVIDER_SITE_OTHER): Payer: Medicare Other

## 2012-08-11 DIAGNOSIS — G7000 Myasthenia gravis without (acute) exacerbation: Secondary | ICD-10-CM

## 2012-08-11 DIAGNOSIS — Z79899 Other long term (current) drug therapy: Secondary | ICD-10-CM

## 2012-08-11 LAB — HEPATIC FUNCTION PANEL
ALT: 8 U/L (ref 0–53)
AST: 10 U/L (ref 0–37)
Alkaline Phosphatase: 113 U/L (ref 39–117)
Indirect Bilirubin: 0.5 mg/dL (ref 0.0–0.9)
Total Protein: 6.2 g/dL (ref 6.0–8.3)

## 2012-08-11 LAB — CBC
HCT: 35.1 % — ABNORMAL LOW (ref 39.0–52.0)
Hemoglobin: 12.5 g/dL — ABNORMAL LOW (ref 13.0–17.0)
MCV: 78.7 fL (ref 78.0–100.0)
RBC: 4.46 MIL/uL (ref 4.22–5.81)
WBC: 6.9 10*3/uL (ref 4.0–10.5)

## 2012-08-11 NOTE — Progress Notes (Signed)
Lab results faxed to [facility] at Dr. Glennon Hamilton office at [facility].  [employer] and confirmed with her that she had received these results. [place]arrow 2:27pm

## 2012-08-17 ENCOUNTER — Other Ambulatory Visit: Payer: Self-pay | Admitting: Licensed Clinical Social Worker

## 2012-08-18 ENCOUNTER — Telehealth: Payer: Self-pay | Admitting: Internal Medicine

## 2012-08-18 ENCOUNTER — Other Ambulatory Visit (INDEPENDENT_AMBULATORY_CARE_PROVIDER_SITE_OTHER): Payer: Medicare Other

## 2012-08-18 LAB — HEPATIC FUNCTION PANEL
ALT: 10 U/L (ref 0–53)
AST: 14 U/L (ref 0–37)
Total Protein: 6.6 g/dL (ref 6.0–8.3)

## 2012-08-18 LAB — BASIC METABOLIC PANEL WITH GFR
BUN: 71 mg/dL — ABNORMAL HIGH (ref 6–23)
Calcium: 9.9 mg/dL (ref 8.4–10.5)
GFR, Est African American: 48 mL/min — ABNORMAL LOW
Glucose, Bld: 380 mg/dL — ABNORMAL HIGH (ref 70–99)

## 2012-08-18 LAB — CBC
MCH: 28.4 pg (ref 26.0–34.0)
Platelets: 293 10*3/uL (ref 150–400)
RBC: 4.47 MIL/uL (ref 4.22–5.81)
RDW: 13.2 % (ref 11.5–15.5)
WBC: 8.8 10*3/uL (ref 4.0–10.5)

## 2012-08-18 LAB — GAMMA GT: GGT: 61 U/L — ABNORMAL HIGH (ref 7–51)

## 2012-08-18 NOTE — Progress Notes (Signed)
08-18-2012  Faxed results to Dr Clarisa Kindred, [facility], Dept of Neurology.  AttnWaynetta Sandy  [phone] Called Beth to verify receipt of fax.  Beth not available.Left message stating fax sent. Will call again 08-19-12 to verbally verify receipt of results.  Alric Quan. PBT  08-18-12  1617p

## 2012-08-18 NOTE — Telephone Encounter (Signed)
[name] called this morning concerned about cramping in his hands and forearms.  He wanted his potassium checked when he came for his labs that were ordered by Dr. Dimas Aguas today.  Basic Metabolic Panel:  Recent Labs  29/56/21 1137  NA 128*  K 5.8*  CL 92*  CO2 26  GLUCOSE 380*  BUN 71*  CREATININE 1.66*  CALCIUM 9.9   Liver Function Tests:  Recent Labs  08/18/12 1137  AST 14  ALT 10  ALKPHOS 117  BILITOT 0.6  PROT 6.6  ALBUMIN 3.2*   CBC:  Recent Labs  08/18/12 1137  WBC 8.8  HGB 12.7*  HCT 35.9*  MCV 80.3  PLT 293   Potassium was elevated along with some mild hyponatremia and hypochloremia.  I called Mr. Delpilar back and he states that when the cramping started he took 20 mEq TID for the last 2 days thinking the cramping was due to his low potassium.  He also states that he has been taking his lasix, metolazone, and spironolactone as directed.   Plan: We will hold his potassium doses for the rest of today as well as his metolazone.  He will restart his medications as directed tomorrow until Monday.  We will recheck his potassium at that time and if it continues to be high we will decrease or stop his potassium supplement.  Leodis Sias, MD Internal Medicine Resident, PGY III Co-Chief Resident, Internal Medicine Pager: [phone] 08/18/2012 4:23 PM

## 2012-08-19 ENCOUNTER — Telehealth: Payer: Self-pay | Admitting: *Deleted

## 2012-08-19 NOTE — Progress Notes (Signed)
08-19-2012 0900  Spoke to LIsa from Dr Jeannette How office.  She verified receiving faxed lab results from 08-18-2012.  Alric Quan, PBT 08-19-2012

## 2012-08-19 NOTE — Telephone Encounter (Signed)
[name], [name] would like for you to call him 3/17 mid morning in re: to hearing aids

## 2012-08-19 NOTE — Progress Notes (Signed)
Received a call from Mr Coward 08-19-2012 1630. He received a bill from [employer] in reference to 08-11-12 labs. He was frustrated with long hold time at [place]. I called [facility] on his behalf. They explained to me that his insurance stated that his policy number was "inactive".  I called Mr. Belmontes and explained this to him.  I had him check his cards to see if he had received a new card for 2014. He had 2 cards with different group numbers.  He was not sure which one was the new card.  I encouraged him to call his Insurance company to verify correct number for 2014.  Instructed him to then call [facility] and give them the correct policy / group Numbers so they can resubmit bill.  I also instructed Mr. Southgate to bring the new card to his next visit so we can update his insurance billing information.  Mr. Poplaski expressed understanding and agreed with plan.  Alric Quan, PBT 08-19-2012

## 2012-08-20 ENCOUNTER — Emergency Department (HOSPITAL_COMMUNITY)
Admission: EM | Admit: 2012-08-20 | Discharge: 2012-08-21 | Discharge status: Against Medical Advice | Payer: Medicare Other | Attending: Emergency Medicine | Admitting: Emergency Medicine

## 2012-08-20 ENCOUNTER — Emergency Department (HOSPITAL_COMMUNITY): Payer: Medicare Other

## 2012-08-20 ENCOUNTER — Other Ambulatory Visit: Payer: Self-pay

## 2012-08-20 ENCOUNTER — Encounter (HOSPITAL_COMMUNITY): Payer: Self-pay | Admitting: Emergency Medicine

## 2012-08-20 DIAGNOSIS — I10 Essential (primary) hypertension: Secondary | ICD-10-CM | POA: Diagnosis present

## 2012-08-20 DIAGNOSIS — Z79899 Other long term (current) drug therapy: Secondary | ICD-10-CM | POA: Insufficient documentation

## 2012-08-20 DIAGNOSIS — E875 Hyperkalemia: Secondary | ICD-10-CM | POA: Diagnosis present

## 2012-08-20 DIAGNOSIS — N183 Chronic kidney disease, stage 3 unspecified: Secondary | ICD-10-CM | POA: Diagnosis present

## 2012-08-20 DIAGNOSIS — Z8709 Personal history of other diseases of the respiratory system: Secondary | ICD-10-CM | POA: Insufficient documentation

## 2012-08-20 DIAGNOSIS — I509 Heart failure, unspecified: Secondary | ICD-10-CM | POA: Insufficient documentation

## 2012-08-20 DIAGNOSIS — G4733 Obstructive sleep apnea (adult) (pediatric): Secondary | ICD-10-CM | POA: Diagnosis present

## 2012-08-20 DIAGNOSIS — F329 Major depressive disorder, single episode, unspecified: Secondary | ICD-10-CM | POA: Diagnosis present

## 2012-08-20 DIAGNOSIS — Z8619 Personal history of other infectious and parasitic diseases: Secondary | ICD-10-CM | POA: Insufficient documentation

## 2012-08-20 DIAGNOSIS — I5020 Unspecified systolic (congestive) heart failure: Secondary | ICD-10-CM

## 2012-08-20 DIAGNOSIS — Z8669 Personal history of other diseases of the nervous system and sense organs: Secondary | ICD-10-CM | POA: Insufficient documentation

## 2012-08-20 DIAGNOSIS — J4489 Other specified chronic obstructive pulmonary disease: Secondary | ICD-10-CM | POA: Diagnosis present

## 2012-08-20 DIAGNOSIS — F988 Other specified behavioral and emotional disorders with onset usually occurring in childhood and adolescence: Secondary | ICD-10-CM | POA: Insufficient documentation

## 2012-08-20 DIAGNOSIS — IMO0001 Reserved for inherently not codable concepts without codable children: Secondary | ICD-10-CM | POA: Insufficient documentation

## 2012-08-20 DIAGNOSIS — I5032 Chronic diastolic (congestive) heart failure: Secondary | ICD-10-CM | POA: Diagnosis present

## 2012-08-20 DIAGNOSIS — F3289 Other specified depressive episodes: Secondary | ICD-10-CM | POA: Diagnosis present

## 2012-08-20 DIAGNOSIS — I2510 Atherosclerotic heart disease of native coronary artery without angina pectoris: Secondary | ICD-10-CM | POA: Insufficient documentation

## 2012-08-20 DIAGNOSIS — I5030 Unspecified diastolic (congestive) heart failure: Secondary | ICD-10-CM

## 2012-08-20 DIAGNOSIS — E785 Hyperlipidemia, unspecified: Secondary | ICD-10-CM | POA: Diagnosis present

## 2012-08-20 DIAGNOSIS — E1165 Type 2 diabetes mellitus with hyperglycemia: Secondary | ICD-10-CM

## 2012-08-20 DIAGNOSIS — Z9861 Coronary angioplasty status: Secondary | ICD-10-CM | POA: Insufficient documentation

## 2012-08-20 DIAGNOSIS — IMO0002 Reserved for concepts with insufficient information to code with codable children: Secondary | ICD-10-CM | POA: Diagnosis present

## 2012-08-20 DIAGNOSIS — Z8679 Personal history of other diseases of the circulatory system: Secondary | ICD-10-CM | POA: Insufficient documentation

## 2012-08-20 DIAGNOSIS — Z8719 Personal history of other diseases of the digestive system: Secondary | ICD-10-CM | POA: Insufficient documentation

## 2012-08-20 DIAGNOSIS — M7989 Other specified soft tissue disorders: Secondary | ICD-10-CM | POA: Insufficient documentation

## 2012-08-20 DIAGNOSIS — N289 Disorder of kidney and ureter, unspecified: Secondary | ICD-10-CM

## 2012-08-20 DIAGNOSIS — Z8739 Personal history of other diseases of the musculoskeletal system and connective tissue: Secondary | ICD-10-CM | POA: Insufficient documentation

## 2012-08-20 DIAGNOSIS — J449 Chronic obstructive pulmonary disease, unspecified: Secondary | ICD-10-CM | POA: Diagnosis present

## 2012-08-20 DIAGNOSIS — D649 Anemia, unspecified: Secondary | ICD-10-CM | POA: Diagnosis present

## 2012-08-20 DIAGNOSIS — G4730 Sleep apnea, unspecified: Secondary | ICD-10-CM | POA: Insufficient documentation

## 2012-08-20 DIAGNOSIS — E871 Hypo-osmolality and hyponatremia: Secondary | ICD-10-CM | POA: Diagnosis present

## 2012-08-20 DIAGNOSIS — Z87891 Personal history of nicotine dependence: Secondary | ICD-10-CM | POA: Insufficient documentation

## 2012-08-20 HISTORY — DX: Atherosclerotic heart disease of native coronary artery without angina pectoris: I25.10

## 2012-08-20 HISTORY — DX: Reserved for concepts with insufficient information to code with codable children: IMO0002

## 2012-08-20 HISTORY — DX: Unspecified diastolic (congestive) heart failure: I50.30

## 2012-08-20 HISTORY — DX: Type 2 diabetes mellitus with unspecified complications: E11.8

## 2012-08-20 HISTORY — DX: Unspecified systolic (congestive) heart failure: I50.20

## 2012-08-20 HISTORY — DX: Type 2 diabetes mellitus with hyperglycemia: E11.65

## 2012-08-20 LAB — HEPATIC FUNCTION PANEL
Alkaline Phosphatase: 139 U/L — ABNORMAL HIGH (ref 39–117)
Bilirubin, Direct: 0.1 mg/dL (ref 0.0–0.3)
Indirect Bilirubin: 0.5 mg/dL (ref 0.3–0.9)
Total Protein: 7.3 g/dL (ref 6.0–8.3)

## 2012-08-20 LAB — BASIC METABOLIC PANEL
Calcium: 9.8 mg/dL (ref 8.4–10.5)
GFR calc non Af Amer: 37 mL/min — ABNORMAL LOW (ref >90)
Glucose, Bld: 378 mg/dL — ABNORMAL HIGH (ref 70–99)
Potassium: 6.2 mEq/L — ABNORMAL HIGH (ref 3.5–5.1)
Sodium: 123 mEq/L — ABNORMAL LOW (ref 135–145)

## 2012-08-20 LAB — TROPONIN I: Troponin I: <0.3 ng/mL (ref <0.30)

## 2012-08-20 LAB — CBC
Hemoglobin: 12.5 g/dL — ABNORMAL LOW (ref 13.0–17.0)
MCHC: 36.4 g/dL — ABNORMAL HIGH (ref 30.0–36.0)
Platelets: 284 10*3/uL (ref 150–400)
RBC: 4.36 MIL/uL (ref 4.22–5.81)

## 2012-08-20 LAB — POCT I-STAT TROPONIN I

## 2012-08-20 LAB — CREATININE, URINE, RANDOM: Creatinine, Urine: 37.51 mg/dL

## 2012-08-20 MED ORDER — SODIUM CHLORIDE 0.9 % IV SOLN
1.0000 g | Freq: Once | INTRAVENOUS | Status: DC
  Filled 2012-08-20: qty 10

## 2012-08-20 MED ORDER — INSULIN ASPART 100 UNIT/ML ~~LOC~~ SOLN: 10.0000 [IU] | Freq: Once | SUBCUTANEOUS | Status: DC

## 2012-08-20 MED ORDER — FUROSEMIDE 10 MG/ML IJ SOLN
120.0000 mg | Freq: Once | INTRAVENOUS | Status: AC
  Administered 2012-08-20: 120 mg via INTRAVENOUS
  Filled 2012-08-20: qty 12

## 2012-08-20 NOTE — ED Notes (Signed)
Pt refused to lay on bed, and refused to put a gown on. I let the nurse know taking care of him. Pt also asked for water and graham crackers.

## 2012-08-20 NOTE — ED Provider Notes (Signed)
I saw and evaluated the patient, reviewed the resident's note and I agree with the findings including ECG and plan.  Few days of recurrent orthopnea and edema with dyspnea with edema on my exam and lungs clear.  Patient appears to have capacity to refuse admission and signed out AGAINST MEDICAL ADVICE, the patient did stay in the ED long enough to talk to his internal medicine doctors who helped the ED staff try to convince the patient to stay for admission to his combination of worsening heart failure, hyperkalemia worsening renal insufficiency, hyponatremia, and hyperglycemia.  He will stop taking his potassium, he'll stop taking his spironolactone, he will continue his usual dose of Lasix, he will followup with the internal medicine clinic.  ECG: History fibrillation, ventricular rate 81, right axis deviation, right bundle branch block, septal Q waves, no acute ischemic changes noted, no significant change noted compared with January 2014  Hurman Horn, MD 08/21/12 [phone]

## 2012-08-20 NOTE — Progress Notes (Signed)
ED Consult Note   Date: 08/21/2012                Patient Name:  [name]  MRN: [id number]   DOB: [birth year]  Age / Sex: 67 y.o., male   PCP: Leodis Sias, MD              Medical Service: Internal Medicine Teaching Service         Chief Complaint: SOB, lower extremity edema  History of Present Illness: Patient is a 67 y.o. 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  In the emergency department, basic laboratory studies revealed sodium of 123, potassium of 6.2, and creatinine of 1.79. We had a lengthy conversation with the patient, lasting at least one hour, about hypokalemia, hyponatremia, renal insufficiency, and the possible complications of all these. The patient was persistent in trying to understand how all these were related and what he could do at home to solve these problems. Over and over again, we explained to him that there is no way to know exactly how to approach these problems without further testing. He refused all of our offerings of admission and also refused to wait 1 hour to receive a calcium gluconate infusion. We explained to him the risks that he would be taking on, including sudden death, if he were to leave AMA. Despite our urgings, he finally decided to leave AMA. His plan is to stop taking potassium supplements and spironolactone and he will come to the clinic Monday morning for a clinic visit and lab work. I explained to him that this is not our recommendation and it places him at a necessary risks. He understands this. I encouraged him to come back to the emergency department if anything changes.   Current Outpatient Medications: Current Facility-Administered Medications  Medication Dose Route Frequency Provider Last Rate Last Dose  . calcium gluconate 1  g in sodium chloride 0.9 % 100 mL IVPB  1 g Intravenous Once Maitri S Kalia-Reynolds, DO      . insulin aspart (novoLOG) injection 10 Units  10 Units Subcutaneous Once Shanon Ace, MD       Current Outpatient Prescriptions  Medication Sig Dispense Refill  . Alum & Mag Hydroxide-Simeth (MAGIC MOUTHWASH W/LIDOCAINE) SOLN Take 10 mLs by mouth 4 (four) times daily as needed (pain). Swish and spit.  250 mL  0  . benazepril (LOTENSIN) 40 MG tablet Take 40 mg by mouth daily before breakfast.      . furosemide (LASIX) 40 MG tablet Take 160 mg by mouth 2 (two) times daily.      . metFORMIN (GLUCOPHAGE) 500 MG tablet Take 1 tablet (500 mg total) by mouth 2 (two) times daily with a meal.  60 tablet  0  . metolazone (ZAROXOLYN) 5 MG tablet Take 1 tablet by mouth daily.       . mirtazapine (REMERON) 30 MG tablet Take 1 tablet (30 mg total) by mouth at bedtime.  30 tablet  1  . mycophenolate (CELLCEPT) 500 MG tablet Take 500 mg by mouth 2 (two) times daily.      Marland Kitchen oxyCODONE (OXY IR/ROXICODONE) 5 MG immediate release tablet Take 1 tablet (5 mg total) by mouth every 4 (four) hours as needed. For pain  120 tablet  0  . pantoprazole (PROTONIX) 40 MG  tablet Take 1 tablet (40 mg total) by mouth daily.  30 tablet  6  . predniSONE (DELTASONE) 5 MG tablet Take 15 mg by mouth daily.      Marland Kitchen pyridostigmine (MESTINON) 60 MG tablet Take 60 mg by mouth 5 (five) times daily. Medication starts to wear off closer to 3 hours      . spironolactone (ALDACTONE) 25 MG tablet Take 25 mg by mouth daily.      Marland Kitchen zolpidem (AMBIEN) 10 MG tablet Take 5 mg by mouth at bedtime.      . [DISCONTINUED] pantoprazole (PROTONIX) 40 MG tablet Take 1 tablet (40 mg total) by mouth daily.  30 tablet  2    Allergies: Allergies  Allergen Reactions  . Aminoglycosides     MG patient  . Beta Adrenergic Blockers Other (See Comments)    MG patient  . Calcium Channel Blockers Other (See Comments)    MG patient  . Metoprolol Other (See  Comments)    MG patient  . Neuromuscular Blocking Agents Other (See Comments)    MG patient  . Other Other (See Comments)    Myasthenia Gravis patient  . Penicillins Other (See Comments)    MG  . Quinine Derivatives Other (See Comments)    MG patient     Past Medical History: Past Medical History  Diagnosis Date  . Sinus tachycardia 2006    a. Nonobstructive CAD by cath 2013. b. s/p atrial tachycardia ablation 08/2011 (sinus node modification)  . Orthostatic hypotension   . Unspecified essential hypertension   . Hyperlipidemia   . Cervical spondylosis with radiculopathy   . Syrinx     T5-T6  . Cord compression     Cord compression syndrome C5-C6, C6-C7  . ADD (attention deficit disorder)   . Decreased libido   . Depression   . Diabetes mellitus type 2, uncontrolled, with complications     CAD, orthostatic hypotension, erectile dysfunction  . Pulmonary nodule, left     Evaluated 04/2011 by pulmonology - felt to be benign granuloma  . Peripheral edema   . Coronary artery disease, non-occlusive     Mild nonobstructive CAD by cath 08/2011  . Systolic congestive heart failure     By echo 2013 at [facility] - reported EF 45% per pt  . Colitis     with surface exudate  . Hemorrhoids     small  . Gastritis   . Clostridium difficile infection 02/15/2012    causing pseudomembranosu colitis  . Diastolic heart failure     grade 2 per echocardiogram (2011)  . Right bundle branch block and left posterior fascicular block   . Sleep apnea     occassionally uses cpap  . 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 Thymectomy  april 2012  . Colonoscopy  11/13/2011    Procedure: COLONOSCOPY;  Surgeon: Theda Belfast, MD;  Location: [facility] ENDOSCOPY;  Service: Endoscopy;  Laterality: N/A;  . Cardiac catheterization    . Esophagogastroduodenoscopy  05/13/2012    Procedure:  ESOPHAGOGASTRODUODENOSCOPY (EGD);  Surgeon: Iva Boop, MD;  Location: [facility];  Service: Endoscopy;  Laterality: N/A;  needs PAT w/ dx of Myastenia gravis and multiple meds. Pt requested lidocaine to numb IV site prior to start.  Very needle phobic.    Family History: Family History  Problem Relation Age of Onset  .  Breast cancer Mother   . Other Father     Beck's Sarcoid  . Colon cancer Neg Hx   . Prostate cancer Neg Hx     Social History: History   Social History  . Marital Status: Single    Spouse Name: N/A    Number of Children: 1  . Years of Education: N/A   Occupational History  . Retired     Surveyor, minerals   Social History Main Topics  . Smoking status: Former Smoker -- 2.50 packs/day for 30 years    Quit date: 06/09/1991  . Smokeless tobacco: Never Used  . Alcohol Use: No  . Drug Use: No  . Sexually Active: Not on file   Other Topics Concern  . Not on file   Social History Narrative   Has a single man's lifestyle and will remind anyone of it when asked.   Prefers to see only male physicians   Retired Surveyor, minerals     Vital Signs: Blood pressure 158/74, pulse 88, temperature 98.1 F (36.7 C), temperature source Oral, resp. rate 20, SpO2 98.00%.   Lab results:  CURRENT LABS: CBC:    Component Value Date/Time   WBC 8.9 08/20/2012 2035   HGB 12.5* 08/20/2012 2035   HCT 34.3* 08/20/2012 2035   PLT 284 08/20/2012 2035   MCV 78.7 08/20/2012 2035   NEUTROABS 7.5 06/10/2012 1216   LYMPHSABS 1.3 06/10/2012 1216   MONOABS 0.7 06/10/2012 1216   EOSABS 0.3 06/10/2012 1216   BASOSABS 0.0 06/10/2012 1216     Metabolic Panel:    Component Value Date/Time   NA 123* 08/20/2012 2035   K 6.2* 08/20/2012 2035   CL 88* 08/20/2012 2035   CO2 23 08/20/2012 2035   BUN 87* 08/20/2012 2035   CREATININE 1.79* 08/20/2012 2035   CREATININE 1.66* 08/18/2012 1137   GLUCOSE 378* 08/20/2012 2035   CALCIUM 9.8 08/20/2012 2035   AST 16 08/20/2012 2305   ALT 11 08/20/2012 2305    ALKPHOS 139* 08/20/2012 2305   BILITOT 0.6 08/20/2012 2305   PROT 7.3 08/20/2012 2305   ALBUMIN 3.7 08/20/2012 2305     Urinalysis:  Recent Labs  08/20/12 2313  COLORURINE YELLOW  LABSPEC 1.013  PHURINE 5.0  GLUCOSEU 100*  HGBUR TRACE*  BILIRUBINUR NEGATIVE  KETONESUR NEGATIVE  PROTEINUR 100*  UROBILINOGEN 0.2  NITRITE NEGATIVE  LEUKOCYTESUR NEGATIVE     Drugs of Abuse  No results found for this basename: labopia,  cocainscrnur,  labbenz,  amphetmu,  thcu,  labbarb     Troponin (Point of Care Test)  Recent Labs  08/20/12 2054  TROPIPOC 0.11*     HISTORICAL LABS: Lab Results  Component Value Date   HGBA1C 11.9 07/12/2012    Lab Results  Component Value Date   CHOL 171 11/04/2011   HDL 35* 11/04/2011   LDLCALC 99 11/04/2011   LDLDIRECT 96.5 07/09/2009   TRIG 187* 11/04/2011   CHOLHDL 4.9 11/04/2011    Lab Results  Component Value Date   TSH 1.351 05/19/2012     Imaging results:   Dg Chest 2 View (08/20/2012) - No active disease.   Original Report Authenticated By: Paulina Fusi, M.D.     Other results: EKG Results:  08/20/2012 Rate:  81 PR:  168 QRS:  142 QTc:  455 Axis:  Right axis EKG: Right bundle branch block, Q waves in V1 and V2, peaked T waves, otherwise unchanged from prior tracing.     Assessment & Plan:  Principal Problem:   Hyponatremia Active Problems:   Diabetes mellitus type II, uncontrolled   HYPERLIPIDEMIA   DEPRESSION   HYPERTENSION   COPD, MILD   Obstructive sleep apnea   Chronic systolic heart failure   Hyperkalemia   Chronic anemia   Acute kidney injury   As documented above in the history of present illness, we had an exhaustive conversation with this patient trying to explain to him the risks of not coming into the hospital and the benefits of further workup and treatment for his hyperkalemia, hyponatremia, and renal insufficiency. He will continue taking her furosemide as previously instructed, stop taking  spironolactone, and stop taking potassium supplements. We advised him to stop taking metformin in the setting of acute renal insufficiency. We will send a message to the [facility] front desk to schedule him an appointment Monday morning in the clinic. He has been instructed to return to the emergency department at any time, especially if his symptoms change.   PATIENT IS LEAVING AGAINST MEDICAL ADVICE   Signed: Lollie Sails, MD  PGY-1, Internal Medicine Resident 08/21/2012, 12:53 AM

## 2012-08-20 NOTE — ED Notes (Addendum)
Patient complaining of shortness of breath that started today.  Patient feels that his potassium level is "out of whack" and that he is overloaded with fluid.  History of congestive heart failure; denies chest pain.  Also complaining of abdominal pain because he took medication on an empty stomach.

## 2012-08-20 NOTE — ED Provider Notes (Signed)
History     CSN: [id number]  Arrival date & time 08/20/12  2010  Chief Complaint  Patient presents with  . Shortness of Breath   HPI  67 y/o male with history of CHF, DM, HTN, HLD who presents with cc of shortness of breath. The patient states his symptoms began approximately 3 days ago. He states that over the past few days he has noticed increased LE edema, orthopnea, and dyspnea on exertion. The patient denies chest pain. He states he feel like he "has too much fluid". He denies any fevers, chills, vomiting, or diarrhea.   Past Medical History  Diagnosis Date  . Sinus tachycardia 2006    a. Nonobstructive CAD by cath 2013. b. s/p atrial tachycardia ablation 08/2011 (sinus node modification)  . Orthostatic hypotension   . Unspecified essential hypertension   . Hyperlipidemia   . Cervical spondylosis with radiculopathy   . Syrinx     T5-T6  . Cord compression     Cord compression syndrome C5-C6, C6-C7  . ADD (attention deficit disorder)   . Decreased libido   . Depression   . Diabetes mellitus type 2, uncontrolled, with complications     CAD, orthostatic hypotension, erectile dysfunction  . Pulmonary nodule, left     Evaluated 04/2011 by pulmonology - felt to be benign granuloma  . Peripheral edema   . Coronary artery disease, non-occlusive     Mild nonobstructive CAD by cath 08/2011  . Systolic congestive heart failure     By echo 2013 at [facility] - reported EF 45% per pt  . Colitis     with surface exudate  . Hemorrhoids     small  . Gastritis   . Clostridium difficile infection 02/15/2012    causing pseudomembranosu colitis  . Diastolic heart failure     grade 2 per echocardiogram (2011)  . Right bundle branch block and left posterior fascicular block   . Sleep apnea     occassionally uses cpap  . Myasthenia gravis     Status post thymectomy April 2012, hx of plasmapheresis    Past Surgical History  Procedure Laterality Date  . Thymectomy  april 2012  . Colonoscopy   11/13/2011    Procedure: COLONOSCOPY;  Surgeon: Theda Belfast, MD;  Location: [facility];  Service: Endoscopy;  Laterality: N/A;  . Cardiac catheterization    . Esophagogastroduodenoscopy  05/13/2012    Procedure: ESOPHAGOGASTRODUODENOSCOPY (EGD);  Surgeon: Iva Boop, MD;  Location: [facility];  Service: Endoscopy;  Laterality: N/A;  needs PAT w/ dx of Myastenia gravis and multiple meds. Pt requested lidocaine to numb IV site prior to start.  Very needle phobic.    Family History  Problem Relation Age of Onset  . Breast cancer Mother   . Other Father     Beck's Sarcoid  . Colon cancer Neg Hx   . Prostate cancer Neg Hx     History  Substance Use Topics  . Smoking status: Former Smoker -- 2.50 packs/day for 30 years    Quit date: 06/09/1991  . Smokeless tobacco: Never Used  . Alcohol Use: No      Review of Systems  Constitutional: Negative for fever and chills.  HENT: Negative for congestion and rhinorrhea.   Respiratory: Positive for shortness of breath. Negative for cough.   Cardiovascular: Positive for leg swelling. Negative for chest pain.  Gastrointestinal: Negative for nausea, vomiting and abdominal pain.  Genitourinary: Negative for dysuria and frequency.  All other systems  reviewed and are negative.    Allergies  Aminoglycosides; Beta adrenergic blockers; Calcium channel blockers; Metoprolol; Neuromuscular blocking agents; Other; Penicillins; and Quinine derivatives  Home Medications   Current Outpatient Rx  Name  Route  Sig  Dispense  Refill  . Alum & Mag Hydroxide-Simeth (MAGIC MOUTHWASH W/LIDOCAINE) SOLN   Oral   Take 10 mLs by mouth 4 (four) times daily as needed (pain). Swish and spit.   250 mL   0   . benazepril (LOTENSIN) 40 MG tablet   Oral   Take 40 mg by mouth daily before breakfast.         . furosemide (LASIX) 40 MG tablet   Oral   Take 160 mg by mouth 2 (two) times daily.         . metFORMIN (GLUCOPHAGE) 500 MG tablet   Oral    Take 1 tablet (500 mg total) by mouth 2 (two) times daily with a meal.   60 tablet   0   . metolazone (ZAROXOLYN) 5 MG tablet   Oral   Take 1 tablet by mouth daily.          . mirtazapine (REMERON) 30 MG tablet   Oral   Take 1 tablet (30 mg total) by mouth at bedtime.   30 tablet   1   . mycophenolate (CELLCEPT) 500 MG tablet   Oral   Take 500 mg by mouth 2 (two) times daily.         Marland Kitchen oxyCODONE (OXY IR/ROXICODONE) 5 MG immediate release tablet   Oral   Take 1 tablet (5 mg total) by mouth every 4 (four) hours as needed. For pain   120 tablet   0   . pantoprazole (PROTONIX) 40 MG tablet   Oral   Take 1 tablet (40 mg total) by mouth daily.   30 tablet   6   . predniSONE (DELTASONE) 5 MG tablet   Oral   Take 15 mg by mouth daily.         Marland Kitchen pyridostigmine (MESTINON) 60 MG tablet   Oral   Take 60 mg by mouth 5 (five) times daily. Medication starts to wear off closer to 3 hours         . spironolactone (ALDACTONE) 25 MG tablet   Oral   Take 25 mg by mouth daily.         Marland Kitchen zolpidem (AMBIEN) 10 MG tablet   Oral   Take 5 mg by mouth at bedtime.           BP 154/104  Pulse 88  Temp(Src) 98.1 F (36.7 C) (Oral)  Resp 22  SpO2 98%  Physical Exam  Nursing note and vitals reviewed. Constitutional: He is oriented to person, place, and time. He appears well-developed and well-nourished. No distress.  HENT:  Head: Normocephalic and atraumatic.  Mouth/Throat: No oropharyngeal exudate.  Eyes: Conjunctivae are normal. Pupils are equal, round, and reactive to light.  Neck: Normal range of motion. Neck supple.  Cardiovascular: Normal rate, regular rhythm and normal heart sounds.  Exam reveals no gallop and no friction rub.   No murmur heard. Pulmonary/Chest: Effort normal and breath sounds normal.  Abdominal: Soft. Bowel sounds are normal. He exhibits no distension. There is no tenderness.  Musculoskeletal: Normal range of motion. He exhibits edema (+3 LE  edema bilaterally). He exhibits no tenderness.  Neurological: He is alert and oriented to person, place, and time.  Skin: Skin is warm and dry. No  erythema.  Psychiatric: He has a normal mood and affect.    ED Course  Procedures (including critical care time)  Labs Reviewed  CBC - Abnormal; Notable for the following:    Hemoglobin 12.5 (*)    HCT 34.3 (*)    MCHC 36.4 (*)    All other components within normal limits  BASIC METABOLIC PANEL - Abnormal; Notable for the following:    Sodium 123 (*)    Potassium 6.2 (*)    Chloride 88 (*)    Glucose, Bld 378 (*)    BUN 87 (*)    Creatinine, Ser 1.79 (*)    GFR calc non Af Amer 37 (*)    GFR calc Af Amer 43 (*)    All other components within normal limits  PRO B NATRIURETIC PEPTIDE - Abnormal; Notable for the following:    Pro B Natriuretic peptide (BNP) 1385.0 (*)    All other components within normal limits  HEPATIC FUNCTION PANEL - Abnormal; Notable for the following:    Alkaline Phosphatase 139 (*)    All other components within normal limits  POCT I-STAT TROPONIN I - Abnormal; Notable for the following:    Troponin i, poc 0.11 (*)    All other components within normal limits  CREATININE, URINE, RANDOM  TROPONIN I  UREA NITROGEN, URINE  OSMOLALITY, URINE  OSMOLALITY  TROPONIN I  TROPONIN I  URINALYSIS, ROUTINE W REFLEX MICROSCOPIC   Dg Chest 2 View  08/20/2012  *RADIOLOGY REPORT*  Clinical Data: Short of breath.  Edema in the lower extremities. Hypertension and diabetes.  CHEST - 2 VIEW  Comparison: 06/10/2012  Findings: Artifact overlies the chest.  There has been previous median sternotomy.  Heart size is normal.  Mediastinal shadows are unremarkable.  The vascularity is normal.  Lungs are clear.  Small granuloma at the left base unchanged.  Chronic degenerative changes effect the spine.  IMPRESSION: No active disease.   Original Report Authenticated By: Paulina Fusi, M.D.     1. Diabetes mellitus type 2, uncontrolled,  with complications   2. Coronary artery disease, non-occlusive   3. Systolic congestive heart failure   4. Diastolic heart failure     MDM  67 y/o male with history of CHF, DM, HTN, HLD who presents with cc of shortness of breath and bilateral lower extremity edema. Well appearing. No hypoxia or tachypnea on room air. CXR clear. BNP lower than previous. Doubt pulmonary edema. Troponin mildly elevated to 0.11. But no acute EKG changes or chest pain. BMP with elevated K of 6.2. The patient was given IV lasix 120 mg. Insulin ordered secondary to hyperglycemia with added benefit of lowering K. However, the patient refused the insulin despite explanation and reasoning. BMP also remarkable for hyponatremia of 123, elevated BUN/Cr from baseline. Given E-lyte abnormalities will admit to medicine for further management.          Shanon Ace, MD 08/21/12 [phone]

## 2012-08-20 NOTE — ED Notes (Signed)
Pt currently refusing IV insulin. States he does not want his "sugar to fall below 300 because it will make him sick."

## 2012-08-21 LAB — URINE MICROSCOPIC-ADD ON

## 2012-08-21 LAB — OSMOLALITY: Osmolality: 324 mOsm/kg — ABNORMAL HIGH (ref 275–300)

## 2012-08-21 LAB — URINALYSIS, ROUTINE W REFLEX MICROSCOPIC
Bilirubin Urine: NEGATIVE
Nitrite: NEGATIVE
Protein, ur: 100 mg/dL — AB
Specific Gravity, Urine: 1.013 (ref 1.005–1.030)
Urobilinogen, UA: 0.2 mg/dL (ref 0.0–1.0)

## 2012-08-21 LAB — UREA NITROGEN, URINE: Urea Nitrogen, Ur: 239 mg/dL

## 2012-08-21 NOTE — ED Notes (Signed)
Pt states he wants to leave AMA, RN explained the risks and benefits of leaving AMA. Pt stated he understands. RN instructed to follow up with his physician on Monday.

## 2012-08-21 NOTE — ED Notes (Signed)
RN went into the room to administer Calcium gluconate patient states "he does not want medicine at this time because he does not want to be here another hour."

## 2012-08-22 ENCOUNTER — Ambulatory Visit (INDEPENDENT_AMBULATORY_CARE_PROVIDER_SITE_OTHER): Payer: Medicare Other | Admitting: Internal Medicine

## 2012-08-22 ENCOUNTER — Ambulatory Visit (HOSPITAL_COMMUNITY)
Admission: RE | Admit: 2012-08-22 | Discharge: 2012-08-22 | Disposition: A | Discharge status: Home / Self Care | Payer: Medicare Other | Source: Ambulatory Visit | Attending: Internal Medicine | Admitting: Internal Medicine

## 2012-08-22 ENCOUNTER — Encounter: Payer: Medicare Other | Admitting: Internal Medicine

## 2012-08-22 ENCOUNTER — Encounter: Payer: Self-pay | Admitting: Internal Medicine

## 2012-08-22 VITALS — BP 144/71 | HR 88 | Temp 97.0°F | Ht 67.0 in | Wt 179.5 lb

## 2012-08-22 DIAGNOSIS — E875 Hyperkalemia: Secondary | ICD-10-CM

## 2012-08-22 DIAGNOSIS — I5042 Chronic combined systolic (congestive) and diastolic (congestive) heart failure: Secondary | ICD-10-CM

## 2012-08-22 DIAGNOSIS — R9431 Abnormal electrocardiogram [ECG] [EKG]: Secondary | ICD-10-CM | POA: Insufficient documentation

## 2012-08-22 DIAGNOSIS — E871 Hypo-osmolality and hyponatremia: Secondary | ICD-10-CM

## 2012-08-22 DIAGNOSIS — N179 Acute kidney failure, unspecified: Secondary | ICD-10-CM

## 2012-08-22 DIAGNOSIS — IMO0001 Reserved for inherently not codable concepts without codable children: Secondary | ICD-10-CM

## 2012-08-22 DIAGNOSIS — I4510 Unspecified right bundle-branch block: Secondary | ICD-10-CM | POA: Insufficient documentation

## 2012-08-22 DIAGNOSIS — I509 Heart failure, unspecified: Secondary | ICD-10-CM

## 2012-08-22 DIAGNOSIS — E1165 Type 2 diabetes mellitus with hyperglycemia: Secondary | ICD-10-CM

## 2012-08-22 LAB — BASIC METABOLIC PANEL
CO2: 27 mEq/L (ref 19–32)
Calcium: 9.8 mg/dL (ref 8.4–10.5)
Chloride: 87 mEq/L — ABNORMAL LOW (ref 96–112)
Glucose, Bld: 356 mg/dL — ABNORMAL HIGH (ref 70–99)
Sodium: 125 mEq/L — ABNORMAL LOW (ref 135–145)

## 2012-08-22 NOTE — Telephone Encounter (Signed)
Sent to shanag.

## 2012-08-22 NOTE — Patient Instructions (Signed)
1.  Stop the Lasix until I see you on Wednesday.  2. Follow up with me on Wednesday.

## 2012-08-22 NOTE — Progress Notes (Signed)
Subjective:   Patient ID: [name] male   DOB: 1945/12/06 67 y.o.   MRN: [id number]  HPI: Mr.Aadam SYRUS NAKAMA is a 67 y.o. man who presents to clinic today for ED follow up.  He presented to the [facility] ED with complaints of shortness of breath.  During that visit he was found to be hyperkalemia, hyponatremic, and hypochloremic with increased creatinine.  He refused admission to the hospital at that time and also refused the treatment for his hyperkalemia.  EKG showed mildly peaked T waves but no QT prolongation at that time.  He left AMA.  He presents here for follow up from that visit.  He states that since that visit he has stopped his potassium supplementation, Spironolactone, and Metolazone.  He has been drinking more water since then.  The cramping has improved in his hands.  He states that he continues to be dizzy while standing and that he can not breath when lying down.  He also noted that his swelling in his legs is down some but still high.    He states that he was told to stop his metformin in the ED because of his AKI.  He has been checking his blood sugars and states that if he doesn't get something to eat very soon he feels like his blood sugar is going to "crash."  CBG done in the clinic showed a glucose of 401. He also left to the bathroom twice during the interview today but states that he has not been more thirsty then normal or urinating more often.    Greater then 75 minutes were spent on today's visit with >50% of the time spent in direct patient counseling and coordination of care.   Past Medical History  Diagnosis Date  . Sinus tachycardia 2006    a. Nonobstructive CAD by cath 2013. b. s/p atrial tachycardia ablation 08/2011 (sinus node modification)  . Orthostatic hypotension   . Unspecified essential hypertension   . Hyperlipidemia   . Cervical spondylosis with radiculopathy   . Syrinx     T5-T6  . Cord compression     Cord compression syndrome C5-C6, C6-C7  . ADD  (attention deficit disorder)   . Decreased libido   . Depression   . Diabetes mellitus type 2, uncontrolled, with complications     CAD, orthostatic hypotension, erectile dysfunction  . Pulmonary nodule, left     Evaluated 04/2011 by pulmonology - felt to be benign granuloma  . Peripheral edema   . Coronary artery disease, non-occlusive     Mild nonobstructive CAD by cath 08/2011  . Systolic congestive heart failure     By echo 2013 at [facility] - reported EF 45% per pt  . Colitis     with surface exudate  . Hemorrhoids     small  . Gastritis   . Clostridium difficile infection 02/15/2012    causing pseudomembranosu colitis  . Diastolic heart failure     grade 2 per echocardiogram (2011)  . Right bundle branch block and left posterior fascicular block   . Sleep apnea     occassionally uses cpap  . 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 Alum & Mag Hydroxide-Simeth (MAGIC MOUTHWASH W/LIDOCAINE) SOLN Take 10 mLs by mouth 4 (four) times daily as needed (pain). Swish and spit.  250 mL  0  . benazepril (LOTENSIN) 40 MG tablet Take  40 mg by mouth daily before breakfast.      . furosemide (LASIX) 40 MG tablet Take 160 mg by mouth 2 (two) times daily.      . metFORMIN (GLUCOPHAGE) 500 MG tablet Take 1 tablet (500 mg total) by mouth 2 (two) times daily with a meal.  60 tablet  0  . metolazone (ZAROXOLYN) 5 MG tablet Take 1 tablet by mouth daily.       . mirtazapine (REMERON) 30 MG tablet Take 1 tablet (30 mg total) by mouth at bedtime.  30 tablet  1  . mycophenolate (CELLCEPT) 500 MG tablet Take 500 mg by mouth 2 (two) times daily.      Marland Kitchen oxyCODONE (OXY IR/ROXICODONE) 5 MG immediate release tablet Take 1 tablet (5 mg total) by mouth every 4 (four) hours as needed. For pain  120 tablet  0  . pantoprazole (PROTONIX) 40 MG tablet Take 1 tablet (40 mg total) by mouth daily.  30 tablet  6  .  predniSONE (DELTASONE) 5 MG tablet Take 15 mg by mouth daily.      Marland Kitchen pyridostigmine (MESTINON) 60 MG tablet Take 60 mg by mouth 5 (five) times daily. Medication starts to wear off closer to 3 hours      . spironolactone (ALDACTONE) 25 MG tablet Take 25 mg by mouth daily.      Marland Kitchen zolpidem (AMBIEN) 10 MG tablet Take 5 mg by mouth at bedtime.      . [DISCONTINUED] pantoprazole (PROTONIX) 40 MG tablet Take 1 tablet (40 mg total) by mouth daily.  30 tablet  2   No current facility-administered medications for this visit.   Family History  Problem Relation Age of Onset  . Breast cancer Mother   . Other Father     Beck's Sarcoid  . Colon cancer Neg Hx   . Prostate cancer Neg Hx    History   Social History  . Marital Status: Single    Spouse Name: N/A    Number of Children: 1  . Years of Education: N/A   Occupational History  . Retired     Surveyor, minerals   Social History Main Topics  . Smoking status: Former Smoker -- 2.50 packs/day for 30 years    Quit date: 06/09/1991  . Smokeless tobacco: Never Used  . Alcohol Use: No  . Drug Use: No  . Sexually Active: Not on file   Other Topics Concern  . Not on file   Social History Narrative   Has a single man's lifestyle and will remind anyone of it when asked.   Prefers to see only male physicians   Retired Surveyor, minerals   Review of Systems: Constitutional: Positive for fatigue. Denies fever, chills, diaphoresis, appetite change.  HEENT: Denies photophobia, eye pain, redness, hearing loss, ear pain, congestion, sore throat, rhinorrhea, sneezing, mouth sores, trouble swallowing, neck pain, neck stiffness and tinnitus.   Respiratory: Positive for SOB, DOE, and orthopnea. Denies cough, chest tightness,  and wheezing.   Cardiovascular: Positive for  leg swelling.  Denies chest pain, palpitations.  Gastrointestinal: Denies nausea, vomiting, abdominal pain, diarrhea, constipation, blood in stool and abdominal distention.  Genitourinary: Denies  dysuria, urgency, frequency, hematuria, flank pain and difficulty urinating.  Musculoskeletal: Denies myalgias, back pain, joint swelling, arthralgias and gait problem.  Skin: Denies pallor, rash and wound.  Neurological: Positive for weakness, light-headedness.  Denies vertigo, seizures, syncope, numbness and headaches.  Hematological: Denies adenopathy. Easy bruising, personal or family bleeding history  Psychiatric/Behavioral: Denies  suicidal ideation, mood changes, confusion, nervousness, sleep disturbance and agitation  Objective:  Physical Exam: Filed Vitals:   08/22/12 1102 08/22/12 1200 08/22/12 1202 08/22/12 1204  BP: 159/73 174/82 158/77 144/71  Pulse: 92 84 82 88  Temp: 97 F (36.1 C)     TempSrc: Oral     Height: 5\' 7"  (1.702 m)     Weight: 179 lb 8 oz (81.421 kg)     SpO2: 100%      Constitutional: Vital signs reviewed.  Orthostatic vitals noted with 30 point SBP drop moving from 45 degree angle laying to standing. Patient is an elderly appearing man in mild distress and cooperative with exam. Alert and oriented x3.  Head: Normocephalic and atraumatic Ear: TM normal bilaterally Mouth: no erythema or exudates, MMM Eyes: PERRL, EOMI, conjunctivae normal, No scleral icterus.  Neck: Supple, Trachea midline normal ROM, JVD noted to the angle of the jaw. No mass, thyromegaly, or carotid bruit present.  Cardiovascular: RRR with occasional ectopic beats, S1 normal, S2 normal, no MRG, pulses symmetric and intact bilaterally Pulmonary/Chest: mild bibasilar inspiratory crackles noted. no wheezes, or rhonchi Abdominal: Soft. Mild periumbilical tenderness to palpation, non-distended, bowel sounds are normal, no masses, organomegaly, or guarding present.  GU: no CVA tenderness Musculoskeletal: No joint deformities, erythema, or stiffness, ROM full and no nontender Hematology: no cervical, inginal, or axillary adenopathy.  Neurological: A&O x3, Strength is normal and symmetric  bilaterally, cranial nerve II-XII are grossly intact, no focal motor deficit, sensory intact to light touch bilaterally.  Skin: 3+ pitting edema noted to the hips bilaterally, 2+ presacral edema noted .  Warm, dry and intact. No rash, cyanosis, or clubbing.  Psychiatric: Labile mood and affect. speech goal directed and behavior is appropriate.  Judgment and insight are mildly impaired. Thought content normal. Cognition and memory are normal.   Assessment & Plan:

## 2012-08-23 NOTE — Assessment & Plan Note (Signed)
Lab Results  Component Value Date   HGBA1C 11.9 07/12/2012   HGBA1C 9.9* 03/17/2012   HGBA1C 14.0 10/30/2011     Assessment:  Diabetes control: poor control (HgbA1C >9%)  Progress toward A1C goal:  unchanged  Comments: He continues to not take any of his medications for his diabetes and continues to insist that his body does not function with blood sugars lower then 275.  He notes that the reason he has such trepidation about admission is that he is concerned that they will drop his blood sugar too low and "thats gonna kill me doc!"  We discussed that his body likely is used to having blood sugars that high but the long term consequences of having his blood sugar that high will be huge if he does not improve his control.   Plan:  Medications:  With his AKI he needs to hold his metformin for now.  We will readdress when he presents for follow up on Wednesday.   Home glucose monitoring:   Frequency: once a day   Timing: before breakfast  Instruction/counseling given: reminded to bring blood glucose meter & log to each visit and reminded to bring medications to each visit  Educational resources provided: brochure;handout  Self management tools provided:    Other plans: We need to continue to reinforce the need for better blood sugar control with [name].

## 2012-08-23 NOTE — Assessment & Plan Note (Signed)
[name] has several features of worsening volume overload including JVD to his jaw as well as +3 pitting edema to the hips and presacral edema.  On the other hand he also has signs of intravascular depletion including orthostasis and worsening renal function.  Despite several explanations of the rational for admission today as well as discussion of the possible consequences of his refusal of admission including worsening kidney function, continued decline of his breathing status, risk of decompensation, heart attack, and death he adamantly refused admission today.  We discussed that he would likely need a right heart catheterization to ascertain his true volume status and adjustment of his medications based on that as well as close monitoring of his electrolytes and kidney function during this process.    With his refusal to accept admission the initial plan was to stop the lasix for Monday and Tuesday and see him back Wednesday morning.  We discussed that if he has chest pain, increasing shortness of breath, problems breathing lying down, or worsening edema in the legs that he should present to the ED.    I contacted Dr. Freeman Ward who is his cardiologist at [facility] and we discussed his case and she states that he likely needs continued diuresis with pressor therapy to increase the effectiveness of the diuresis.  She stated that his worsening renal function was likely secondary to renal vascular congestion and a sign of needing more diuresis.  His orthostasis is likely secondary to his myasthenia vs his poorly controlled diabetes. She will contact him today and have him restart his lasix.  She also suggested that if his weight and breathing did not improve that we could consider switching him to Torsemide 60 mg BID for more consistent diuresis.

## 2012-08-23 NOTE — Assessment & Plan Note (Signed)
His baseline creatinine as recently as 06/2012 was ~1.0.  Over the last few months his creatinine has steadily risen and on check today was 1.71.  Differential diagnosis includes volume depletion vs cardiorenal syndrome vs progression of diabetic nephropathy.  With his signs of fluid overload cardiorenal syndrome seems the most appropriate.  We will hold  Diuretics until his follow up and if this does not improve we will work to diuresis him.  Again he refused admission today which limits my options to help figure out how we can treat him most efficiently.

## 2012-08-23 NOTE — Telephone Encounter (Signed)
I was out of the office on Monday.  Mr. Derrick is more than welcome to leave a voicemail on my phone line and I will return his message at my earliest possible time.  I have already sent Mr. Echavarria all needed information by mail and email in January 2014.  Thank you, please refer him to my direct number.

## 2012-08-23 NOTE — Assessment & Plan Note (Signed)
Basic Metabolic Panel:  Recent Labs Lab 08/20/12 2035 08/22/12 1059  NA 123* 125*  K 6.2* 4.7  CL 88* 87*  CO2 23 27  GLUCOSE 378* 356*  BUN 87* 82*  CREATININE 1.79* 1.71*  CALCIUM 9.8 9.8   Potassium is down out of the danger zone today and repeat EKG shows a resolution of the mildly peaked T waves seen in the ED.  He has stopped his potassium supplementation as well as his spironolactone.  We will continue to hold these medications but will need to monitor closely if the creatinine and orthostasis does not improve with holding diuresis and we proceed with aggressive diuresis.

## 2012-08-23 NOTE — Assessment & Plan Note (Signed)
Basic Metabolic Panel:  Recent Labs Lab 08/20/12 2035 08/22/12 1059  NA 123* 125*  K 6.2* 4.7  CL 88* 87*  CO2 23 27  GLUCOSE 378* 356*  BUN 87* 82*  CREATININE 1.79* 1.71*  CALCIUM 9.8 9.8   His sodium corrects to 129 which continues to be hyponatremic.  He appears volume overloaded which indicates that this is hypervolemic hyponatremia likely secondary to worsening CHF.  We discussed fluid restriction of <2L daily for the next few days along with holding his lasix at this time but he likely will need diuresis for worsening congestion.  We will recheck when he follows up in 2 days.

## 2012-08-24 ENCOUNTER — Ambulatory Visit (INDEPENDENT_AMBULATORY_CARE_PROVIDER_SITE_OTHER): Payer: Medicare Other | Admitting: Internal Medicine

## 2012-08-24 ENCOUNTER — Encounter: Payer: Self-pay | Admitting: Internal Medicine

## 2012-08-24 VITALS — BP 146/73 | HR 87 | Temp 97.3°F | Ht 67.0 in | Wt 181.5 lb

## 2012-08-24 DIAGNOSIS — I509 Heart failure, unspecified: Secondary | ICD-10-CM

## 2012-08-24 LAB — BASIC METABOLIC PANEL
BUN: 74 mg/dL — ABNORMAL HIGH (ref 6–23)
CO2: 28 mEq/L (ref 19–32)
Chloride: 89 mEq/L — ABNORMAL LOW (ref 96–112)
Creat: 1.59 mg/dL — ABNORMAL HIGH (ref 0.50–1.35)
Glucose, Bld: 353 mg/dL — ABNORMAL HIGH (ref 70–99)
Potassium: 4.4 mEq/L (ref 3.5–5.3)

## 2012-08-24 MED ORDER — TORSEMIDE 20 MG PO TABS: 60.0000 mg | ORAL_TABLET | Freq: Two times a day (BID) | ORAL | Status: DC

## 2012-08-24 NOTE — Patient Instructions (Signed)
1.  Stop the Lasix  2.  Start the Torsemide 20 mg tablets.  Take 3 tablets twice daily  - Watch your weight every day and if it starts to consistently go up please call  3.  Hold off the Potassium supplements as well as the spironolactone and metolazone.  4.  Follow up with me in 1 week.

## 2012-08-24 NOTE — Progress Notes (Signed)
Subjective:   Patient ID: [name] male   DOB: 19-Sep-1945 67 y.o.   MRN: [id number]  HPI: [name] is a 67 y.o. man who presents to clinic today for follow up from his last appointment.  He refused admission on the 17th and after a long discussion we decided to hold his lasix for 2 days and then restart it.  HE states that he took it yesterday with a good diuresis.  He feels better today and denies orthopnea or shortness of breath.  He states that he is compliant with his diet and fluid restriction.  When asked he states he drinks no more then 64 oz of water per day.    See Problem focused Assessment and Plan for full details of his chronic medical conditions including chronic anemia, hyponatremia, and CKD stage III.    Past Medical History  Diagnosis Date  . Sinus tachycardia 2006    a. Nonobstructive CAD by cath 2013. b. s/p atrial tachycardia ablation 08/2011 (sinus node modification)  . Orthostatic hypotension   . Unspecified essential hypertension   . Hyperlipidemia   . Cervical spondylosis with radiculopathy   . Syrinx     T5-T6  . Cord compression     Cord compression syndrome C5-C6, C6-C7  . ADD (attention deficit disorder)   . Decreased libido   . Depression   . Diabetes mellitus type 2, uncontrolled, with complications     CAD, orthostatic hypotension, erectile dysfunction  . Pulmonary nodule, left     Evaluated 04/2011 by pulmonology - felt to be benign granuloma  . Peripheral edema   . Coronary artery disease, non-occlusive     Mild nonobstructive CAD by cath 08/2011  . Systolic congestive heart failure     By echo 2013 at [facility] - reported EF 45% per pt  . Colitis     with surface exudate  . Hemorrhoids     small  . Gastritis   . Clostridium difficile infection 02/15/2012    causing pseudomembranosu colitis  . Diastolic heart failure     grade 2 per echocardiogram (2011)  . Right bundle branch block and left posterior fascicular block   . Sleep apnea      occassionally uses cpap  . 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 Alum & Mag Hydroxide-Simeth (MAGIC MOUTHWASH W/LIDOCAINE) SOLN Take 10 mLs by mouth 4 (four) times daily as needed (pain). Swish and spit.  250 mL  0  . benazepril (LOTENSIN) 40 MG tablet Take 40 mg by mouth daily before breakfast.      . furosemide (LASIX) 40 MG tablet Take 160 mg by mouth 2 (two) times daily.      . metFORMIN (GLUCOPHAGE) 500 MG tablet Take 1 tablet (500 mg total) by mouth 2 (two) times daily with a meal.  60 tablet  0  . metolazone (ZAROXOLYN) 5 MG tablet Take 1 tablet by mouth daily.       . mirtazapine (REMERON) 30 MG tablet Take 1 tablet (30 mg total) by mouth at bedtime.  30 tablet  1  . mycophenolate (CELLCEPT) 500 MG tablet Take 500 mg by mouth 2 (two) times daily.      Marland Kitchen oxyCODONE (OXY IR/ROXICODONE) 5 MG immediate release tablet Take 1 tablet (5 mg total) by mouth every 4 (four) hours as needed. For pain  120 tablet  0  .  pantoprazole (PROTONIX) 40 MG tablet Take 1 tablet (40 mg total) by mouth daily.  30 tablet  6  . predniSONE (DELTASONE) 5 MG tablet Take 15 mg by mouth daily.      Marland Kitchen pyridostigmine (MESTINON) 60 MG tablet Take 60 mg by mouth 5 (five) times daily. Medication starts to wear off closer to 3 hours      . spironolactone (ALDACTONE) 25 MG tablet Take 25 mg by mouth daily.      Marland Kitchen zolpidem (AMBIEN) 10 MG tablet Take 5 mg by mouth at bedtime.      . [DISCONTINUED] pantoprazole (PROTONIX) 40 MG tablet Take 1 tablet (40 mg total) by mouth daily.  30 tablet  2   No current facility-administered medications for this visit.   Family History  Problem Relation Age of Onset  . Breast cancer Mother   . Other Father     Beck's Sarcoid  . Colon cancer Neg Hx   . Prostate cancer Neg Hx    History   Social History  . Marital Status: Single    Spouse Name: N/A    Number of Children: 1   . Years of Education: N/A   Occupational History  . Retired     Surveyor, minerals   Social History Main Topics  . Smoking status: Former Smoker -- 2.50 packs/day for 30 years    Quit date: 06/09/1991  . Smokeless tobacco: Never Used  . Alcohol Use: No  . Drug Use: No  . Sexually Active: None   Other Topics Concern  . None   Social History Narrative   Has a single man's lifestyle and will remind anyone of it when asked.   Prefers to see only male physicians   Retired Surveyor, minerals   Review of Systems: A full 12 system ROS is negative except as noted in the HPI and A&P.   Objective:  Physical Exam: Filed Vitals:   08/24/12 1037  BP: 143/70  Pulse: 86  Temp: 97.3 F (36.3 C)  TempSrc: Oral  Height: 5\' 7"  (1.702 m)  Weight: 181 lb 8 oz (82.328 kg)  SpO2: 99%   Constitutional: Vital signs reviewed.  Patient is a well-developed and well-nourished elderly appearing man in no acute distress and cooperative with exam. Alert and oriented x3.  Head: Normocephalic and atraumatic Ear: TM normal bilaterally Mouth: no erythema or exudates, MMM Eyes: PERRL, EOMI, conjunctivae normal, No scleral icterus.  Neck: Supple, Trachea midline normal ROM, No JVD, mass, thyromegaly, or carotid bruit present.  Cardiovascular: RRR with occasional ectopic beats, S1 normal, S2 normal, no MRG, pulses symmetric and intact bilaterally Pulmonary/Chest: CTAB, no wheezes, rales, or rhonchi Abdominal: Soft. Mild periumbilical tenderness, non-distended, bowel sounds are normal, no masses, organomegaly, or guarding present.  GU: no CVA tenderness Musculoskeletal: No joint deformities, erythema, or stiffness, ROM full and no nontender Hematology: no cervical, inginal, or axillary adenopathy.  Neurological: A&O x3, Strength is normal and symmetric bilaterally, cranial nerve II-XII are grossly intact, no focal motor deficit, sensory intact to light touch bilaterally.  Skin: 3+ pitting edema to the knees bilaterally.   Warm, dry and intact. No rash, cyanosis, or clubbing.  Psychiatric: anxious mood and labile affect. speech is goal directed.  Behavior is anxious.  Judgment and insight appear poor.  Thought content normal. Cognition is intact and memory are normal.   Assessment & Plan:

## 2012-08-28 ENCOUNTER — Emergency Department (HOSPITAL_COMMUNITY): Payer: Medicare Other

## 2012-08-28 ENCOUNTER — Other Ambulatory Visit: Payer: Self-pay

## 2012-08-28 ENCOUNTER — Encounter (HOSPITAL_COMMUNITY): Payer: Self-pay

## 2012-08-28 ENCOUNTER — Emergency Department (HOSPITAL_COMMUNITY)
Admission: EM | Admit: 2012-08-28 | Discharge: 2012-08-28 | Disposition: A | Discharge status: Home / Self Care | Payer: Medicare Other | Attending: Emergency Medicine | Admitting: Emergency Medicine

## 2012-08-28 DIAGNOSIS — Z8719 Personal history of other diseases of the digestive system: Secondary | ICD-10-CM | POA: Insufficient documentation

## 2012-08-28 DIAGNOSIS — I5042 Chronic combined systolic (congestive) and diastolic (congestive) heart failure: Secondary | ICD-10-CM

## 2012-08-28 DIAGNOSIS — Z8669 Personal history of other diseases of the nervous system and sense organs: Secondary | ICD-10-CM | POA: Insufficient documentation

## 2012-08-28 DIAGNOSIS — Z8659 Personal history of other mental and behavioral disorders: Secondary | ICD-10-CM | POA: Insufficient documentation

## 2012-08-28 DIAGNOSIS — F329 Major depressive disorder, single episode, unspecified: Secondary | ICD-10-CM | POA: Insufficient documentation

## 2012-08-28 DIAGNOSIS — R42 Dizziness and giddiness: Secondary | ICD-10-CM | POA: Insufficient documentation

## 2012-08-28 DIAGNOSIS — G7000 Myasthenia gravis without (acute) exacerbation: Secondary | ICD-10-CM | POA: Insufficient documentation

## 2012-08-28 DIAGNOSIS — Z8639 Personal history of other endocrine, nutritional and metabolic disease: Secondary | ICD-10-CM | POA: Insufficient documentation

## 2012-08-28 DIAGNOSIS — Z79899 Other long term (current) drug therapy: Secondary | ICD-10-CM | POA: Insufficient documentation

## 2012-08-28 DIAGNOSIS — I2510 Atherosclerotic heart disease of native coronary artery without angina pectoris: Secondary | ICD-10-CM | POA: Insufficient documentation

## 2012-08-28 DIAGNOSIS — Z862 Personal history of diseases of the blood and blood-forming organs and certain disorders involving the immune mechanism: Secondary | ICD-10-CM | POA: Insufficient documentation

## 2012-08-28 DIAGNOSIS — Z8739 Personal history of other diseases of the musculoskeletal system and connective tissue: Secondary | ICD-10-CM | POA: Insufficient documentation

## 2012-08-28 DIAGNOSIS — Z9861 Coronary angioplasty status: Secondary | ICD-10-CM | POA: Insufficient documentation

## 2012-08-28 DIAGNOSIS — E1165 Type 2 diabetes mellitus with hyperglycemia: Secondary | ICD-10-CM | POA: Insufficient documentation

## 2012-08-28 DIAGNOSIS — Z8679 Personal history of other diseases of the circulatory system: Secondary | ICD-10-CM | POA: Insufficient documentation

## 2012-08-28 DIAGNOSIS — F3289 Other specified depressive episodes: Secondary | ICD-10-CM | POA: Insufficient documentation

## 2012-08-28 DIAGNOSIS — I10 Essential (primary) hypertension: Secondary | ICD-10-CM | POA: Insufficient documentation

## 2012-08-28 DIAGNOSIS — IMO0002 Reserved for concepts with insufficient information to code with codable children: Secondary | ICD-10-CM | POA: Insufficient documentation

## 2012-08-28 DIAGNOSIS — M7989 Other specified soft tissue disorders: Secondary | ICD-10-CM | POA: Insufficient documentation

## 2012-08-28 DIAGNOSIS — Z87891 Personal history of nicotine dependence: Secondary | ICD-10-CM | POA: Insufficient documentation

## 2012-08-28 DIAGNOSIS — Z8619 Personal history of other infectious and parasitic diseases: Secondary | ICD-10-CM | POA: Insufficient documentation

## 2012-08-28 DIAGNOSIS — R0789 Other chest pain: Secondary | ICD-10-CM | POA: Insufficient documentation

## 2012-08-28 LAB — CBC
HCT: 32.4 % — ABNORMAL LOW (ref 39.0–52.0)
Hemoglobin: 11.9 g/dL — ABNORMAL LOW (ref 13.0–17.0)
MCH: 28.5 pg (ref 26.0–34.0)
MCHC: 36.7 g/dL — ABNORMAL HIGH (ref 30.0–36.0)
MCV: 77.5 fL — ABNORMAL LOW (ref 78.0–100.0)
Platelets: 262 10*3/uL (ref 150–400)
RBC: 4.18 MIL/uL — ABNORMAL LOW (ref 4.22–5.81)
RDW: 12.7 % (ref 11.5–15.5)
WBC: 10 10*3/uL (ref 4.0–10.5)

## 2012-08-28 LAB — BASIC METABOLIC PANEL WITH GFR
BUN: 74 mg/dL — ABNORMAL HIGH (ref 6–23)
CO2: 26 meq/L (ref 19–32)
Chloride: 88 meq/L — ABNORMAL LOW (ref 96–112)
Creatinine, Ser: 1.24 mg/dL (ref 0.50–1.35)
GFR calc Af Amer: 67 mL/min — ABNORMAL LOW (ref >90)
Glucose, Bld: 305 mg/dL — ABNORMAL HIGH (ref 70–99)
Potassium: 4.2 meq/L (ref 3.5–5.1)

## 2012-08-28 LAB — BASIC METABOLIC PANEL
Calcium: 9.6 mg/dL (ref 8.4–10.5)
GFR calc non Af Amer: 58 mL/min — ABNORMAL LOW (ref >90)
Sodium: 125 mEq/L — ABNORMAL LOW (ref 135–145)

## 2012-08-28 LAB — POCT I-STAT TROPONIN I: Troponin i, poc: 0.09 ng/mL (ref 0.00–0.08)

## 2012-08-28 LAB — PRO B NATRIURETIC PEPTIDE: Pro B Natriuretic peptide (BNP): 2788 pg/mL — ABNORMAL HIGH (ref 0–125)

## 2012-08-28 MED ORDER — METOLAZONE 5 MG PO TABS: 5.0000 mg | ORAL_TABLET | Freq: Every day | ORAL | Status: DC

## 2012-08-28 NOTE — ED Notes (Addendum)
Pt requesting to have another room because he sts, "This room smells like [employer]. I can't deal with this room. Do you have another room?" Informed pt that there was no other room available at this time. Also, informed pt that he needed to change into a gown so a cardiac monitor came be applied. Pt sts "I don't need no gown or a monitor." Dr.Ghim aware of patient's refusal to get in a gown and that patient has a positive i-stat troponin.

## 2012-08-28 NOTE — Consult Note (Signed)
Consult Note (discharged from ED)  Date: 08/28/2012  Patient name: [name] Medical record number: [id number] Date of birth: 1946-01-08 Age: 67 y.o. Gender: male PCP: PRIBULA,CHRISTOPHER, MD  Medical Service: Internal Medicine Teaching Service   Attending physician: Dr Debe Coder    Chief Complaint: Weight gain and leg edema for three days   History of Present Illness: This is a 67 year old man, with past medical history of coronary artery disease, congestive heart failure with recurrent fluid overload, myasthenia gravis,, who presents to the ED, with a main complaint of weight gain of more than 3 pounds over the last 4 days. Patient was seen in the clinic on 08/24/2012 and his furosemide 160 mg twice a day was switched to torsemide 60 mg twice a day. He reports that since then his urine output has been unsatisfactory and has noted that his weight has increased from 181 pounds to 184 pounds. He is concerned about his inadequate urine output and weight gain. He was not able to get in touch with his PCP over the weekend to discuss this and therefore he presented to the ED. He denies any other complaints of shortness of breath, fevers, chills, or increased fatigue. He actually reports that on arrival to the ED, he started passing enough volume of urine.  Meds: Current Outpatient Rx  Name  Route  Sig  Dispense  Refill  . Alum & Mag Hydroxide-Simeth (MAGIC MOUTHWASH W/LIDOCAINE) SOLN   Oral   Take 10 mLs by mouth 4 (four) times daily as needed (pain). Swish and spit.   250 mL   0   . benazepril (LOTENSIN) 40 MG tablet   Oral   Take 40 mg by mouth daily before breakfast.         . metFORMIN (GLUCOPHAGE) 500 MG tablet   Oral   Take 1 tablet (500 mg total) by mouth 2 (two) times daily with a meal.   60 tablet   0   . mirtazapine (REMERON) 30 MG tablet   Oral   Take 1 tablet (30 mg total) by mouth at bedtime.   30 tablet   1   . mycophenolate (CELLCEPT) 500 MG tablet   Oral    Take 500 mg by mouth 2 (two) times daily.         Marland Kitchen oxyCODONE (OXY IR/ROXICODONE) 5 MG immediate release tablet   Oral   Take 1 tablet (5 mg total) by mouth every 4 (four) hours as needed. For pain   120 tablet   0   . pantoprazole (PROTONIX) 40 MG tablet   Oral   Take 1 tablet (40 mg total) by mouth daily.   30 tablet   6   . predniSONE (DELTASONE) 5 MG tablet   Oral   Take 15 mg by mouth daily.         Marland Kitchen pyridostigmine (MESTINON) 60 MG tablet   Oral   Take 60 mg by mouth 5 (five) times daily. Medication starts to wear off closer to 3 hours         . spironolactone (ALDACTONE) 25 MG tablet   Oral   Take 25 mg by mouth daily.         Marland Kitchen torsemide (DEMADEX) 20 MG tablet   Oral   Take 3 tablets (60 mg total) by mouth 2 (two) times daily.   180 tablet   3   . zolpidem (AMBIEN) 10 MG tablet   Oral   Take 5 mg by mouth  at bedtime.           Allergies: Allergies as of 08/28/2012 - Review Complete 08/28/2012  Allergen Reaction Noted  . Aminoglycosides  04/14/2011  . Beta adrenergic blockers Other (See Comments)   . Calcium channel blockers Other (See Comments)   . Metoprolol Other (See Comments) 09/12/2011  . Neuromuscular blocking agents Other (See Comments) 04/14/2011  . Other Other (See Comments) 09/12/2011  . Penicillins Other (See Comments) 09/12/2011  . Quinine derivatives Other (See Comments) 09/12/2011   Past Medical History  Diagnosis Date  . Sinus tachycardia 2006    a. Nonobstructive CAD by cath 2013. b. s/p atrial tachycardia ablation 08/2011 (sinus node modification)  . Orthostatic hypotension   . Unspecified essential hypertension   . Hyperlipidemia   . Cervical spondylosis with radiculopathy   . Syrinx     T5-T6  . Cord compression     Cord compression syndrome C5-C6, C6-C7  . ADD (attention deficit disorder)   . Decreased libido   . Depression   . Diabetes mellitus type 2, uncontrolled, with complications     CAD, orthostatic  hypotension, erectile dysfunction  . Pulmonary nodule, left     Evaluated 04/2011 by pulmonology - felt to be benign granuloma  . Peripheral edema   . Coronary artery disease, non-occlusive     Mild nonobstructive CAD by cath 08/2011  . Systolic congestive heart failure     By echo 2013 at [facility] - reported EF 45% per pt  . Colitis     with surface exudate  . Hemorrhoids     small  . Gastritis   . Clostridium difficile infection 02/15/2012    causing pseudomembranosu colitis  . Diastolic heart failure     grade 2 per echocardiogram (2011)  . Right bundle branch block and left posterior fascicular block   . Sleep apnea     occassionally uses cpap  . 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 Thymectomy  april 2012  . Colonoscopy  11/13/2011    Procedure: COLONOSCOPY;  Surgeon: Theda Belfast, MD;  Location: [facility];  Service: Endoscopy;  Laterality: N/A;  . Cardiac catheterization    . Esophagogastroduodenoscopy  05/13/2012    Procedure: ESOPHAGOGASTRODUODENOSCOPY (EGD);  Surgeon: Iva Boop, MD;  Location: [facility];  Service: Endoscopy;  Laterality: N/A;  needs PAT w/ dx of Myastenia gravis and multiple meds. Pt requested lidocaine to numb IV site prior to start.  Very needle phobic.   Family History  Problem Relation Age of Onset  . Breast cancer Mother   . Other Father     Beck's Sarcoid  . Colon cancer Neg Hx   . Prostate cancer Neg Hx    History   Social History  . Marital Status: Single    Spouse Name: N/A    Number of Children: 1  . Years of Education: N/A   Occupational History  . Retired     Surveyor, minerals   Social History Main Topics  . Smoking status: Former Smoker -- 2.50 packs/day for 30 years    Quit date: 06/09/1991  . Smokeless tobacco: Never Used  . Alcohol Use: No  . Drug Use: No  . Sexually Active: Not on file   Other Topics Concern  . Not on file    Social History Narrative   Has a single man's lifestyle and will remind anyone of  it when asked.   Prefers to see only male physicians   Retired Surveyor, minerals    Review of Systems:  Constitutional: Denies fever, chills, diaphoresis, appetite change and fatigue.  HEENT: Denies photophobia, eye pain, redness, hearing loss, ear pain, congestion, sore throat, rhinorrhea, sneezing, mouth sores, trouble swallowing, neck pain, neck stiffness and tinnitus.  Respiratory: Denies SOB, DOE, cough, chest tightness, and wheezing.  Cardiovascular: Denies chest pain, palpitations and leg swelling.  Gastrointestinal: Denies nausea, vomiting, abdominal pain, diarrhea, constipation, blood in stool and abdominal distention.  Genitourinary: Denies dysuria, urgency, frequency, hematuria, flank pain and difficulty urinating.  Musculoskeletal: Denies myalgias, back pain, joint swelling, arthralgias and gait problem.  Skin: Denies pallor, rash and wound.  Neurological: Denies dizziness, seizures, syncope, weakness, light-headedness, numbness and headaches.  Hematological: Denies adenopathy. Easy bruising, personal or family bleeding history  Psychiatric/Behavioral: Denies suicidal ideation, mood changes, confusion, nervousness, sleep disturbance and agitation   Physical Exam: Blood pressure 127/81, pulse 84, temperature 97.5 F (36.4 C), temperature source Oral, resp. rate 18, height 5\' 7"  (1.702 m), weight 184 lb 6 oz (83.632 kg), SpO2 100.00%. Constitutional: Vital signs reviewed. Patient is a well-developed and well-nourished in no acute distress and  and cooperative with exam. Alert and oriented x3. Patient is ambulating in the hallway. Head: Normocephalic and atraumatic  Ear: TM normal bilaterally  Mouth: no erythema or exudates, MMM  Eyes: PERRL, EOMI, conjunctivae normal, No scleral icterus.  Neck: Supple, Trachea midline normal ROM, No JVD, mass, thyromegaly, or carotid bruit present.  Cardiovascular:  RRR, S1 normal, S2 normal, pulses symmetric and intact bilaterally  Pulmonary/Chest: CTAB, no wheezes, rales, or rhonchi  Abdominal: Soft. Non-tender, non-distended, bowel sounds are normal, no masses, organomegaly, or guarding present.  GU: no CVA tenderness Musculoskeletal: No joint deformities, erythema, or stiffness, ROM full and no non-tender. Bilateral pitting edema to the level the knees.  Neurological: A&O x3, Strength is normal and symmetric bilaterally, cranial nerve II-XII are grossly intact, no focal motor deficit, sensory intact to light touch bilaterally.  Skin: Warm, dry and intact. No rash, cyanosis, or clubbing.  Psychiatric: Normal mood and affect. speech and behavior is normal. Judgment and thought content normal. Cognition and memory are normal.  Lab results: Basic Metabolic Panel:  Recent Labs  16/10/96 1511  NA 125*  K 4.2  CL 88*  CO2 26  GLUCOSE 305*  BUN 74*  CREATININE 1.24  CALCIUM 9.6   CBC:  Recent Labs  08/28/12 1511  WBC 10.0  HGB 11.9*  HCT 32.4*  MCV 77.5*  PLT 262   BNP:  Recent Labs  08/28/12 1511  PROBNP 2788.0*  Urine Drug Screen: Drugs of Abuse  No results found for this basename: labopia, cocainscrnur, labbenz, amphetmu, thcu, labbarb      Imaging results:  Dg Chest 2 View  08/28/2012  *RADIOLOGY REPORT*  Clinical Data: Shortness of breath, chest pain  CHEST - 2 VIEW  Comparison: 08/20/2012  Findings: Cardiomediastinal silhouette is stable.  No acute infiltrate or pleural effusion.  No pulmonary edema.  Mild right basilar atelectasis. Status post median sternotomy.  IMPRESSION: No active disease.  Mild right basilar atelectasis.   Original Report Authenticated By: Natasha Mead, M.D.    Other results: EKG: Normal sinus rhythm with tachycardia of 95 beats per minute, right bundle branch block, some remarkable premature supraventricular complexes, which are nonsustained. Has not significantly changed from the one done on  08/22/2012.  Assessment & Plan by Problem: This is a 67 year old gentleman  with past medical history of congestive heart failure, who presents with recent weight gain of 3 pounds over the last 4 days. The patient does not have any other symptoms of shortness of breath, chest pain or syncope.  Congestive heart failure: His current regimen with torsemide 60 mg twice a day was initiated on 08/24/2012 by his PCP. He is currently not responding to this therapy adequately with some weight gain of 3 pounds and reported history of low urine output. Initial assessment in the ED, is unremarkable, including his EKG, which is unchanged showing right bundle branch block, and PVCs present from the previous. Troponins was 0.09, but this has been elevated as far back as 8 months ago. Patient is completely asymptomatic. He is ambulating in the hallway and his vitals are stable. His medications have been reviewed and discussed with the patient. His basic metabolic panel is only remarkable for a sodium level which is 125. Potassium is stable at 4.2. ProBNP is 2788, but chest x-ray does not reveal pulmonary edema. Creatine is actually better at 1.24 from 1.59 on 08/24/2012. Added metolazone 5 mg once daily to augment his diuresis. The patient will be discharged from the ED with plan to followup with his preset appointment with his PCP on 08/31/2012. During this interim period the patient has been encouraged to monitor his weight daily and controlling his fluid and salt intake.   Signed:  Dow Adolph PGY-1 Internal Medicine Teaching Service Pager: [phone] 08/28/2012, 5:39 PM

## 2012-08-28 NOTE — ED Notes (Signed)
Pt given discharge information and explained by Admitting Dr.

## 2012-08-28 NOTE — ED Notes (Signed)
istat troponin reported to [place], [place]

## 2012-08-28 NOTE — ED Provider Notes (Signed)
History     CSN: [id number]  Arrival date & time 08/28/12  1453   First MD Initiated Contact with Patient 08/28/12 1607      Chief Complaint  Patient presents with  . Shortness of Breath  . Chest Pain    (Consider location/radiation/quality/duration/timing/severity/associated sxs/prior treatment) HPI Comments: Patient with long-standing history of congestive heart failure, followed closely by internal medicine teaching service. Patient was recently changed from furosemide 2 torsemide 3 or 4 days ago. He was taking 120 mg of furosemide to 60 mg of torsemide twice a day. He complains of minimal chest tightness that is intermittent which she is felt in the past, not associated with diaphoresis, nausea or worse shortness of breath. He does have some shortness of breath with laying flat. He denies any change to his exercise capacity. He thinks he has gained about 4 pounds in weight during this timeframe and notices some swelling more to his lower extremities as well as his lower abdomen. He reports he has urinated 4 or 5 times today but not with as much force her volume as he normally does when he was on his furosemide. He also feels some intermittent dizziness which he attributes to possibly low sodium and also cramps and was concerned that his potassium was too high. No coughing or cold symptoms. No diarrhea. He did not contact his primary care physician today knowing that it was Sunday. She reports upon arrival, in the waiting room he did urinate again rather voluminously and reports he does feel somewhat improved regarding his shortness of breath. No current chest pain or tightness at this moment.  The history is provided by the patient and medical records.    Past Medical History  Diagnosis Date  . Sinus tachycardia 2006    a. Nonobstructive CAD by cath 2013. b. s/p atrial tachycardia ablation 08/2011 (sinus node modification)  . Orthostatic hypotension   . Unspecified essential hypertension    . Hyperlipidemia   . Cervical spondylosis with radiculopathy   . Syrinx     T5-T6  . Cord compression     Cord compression syndrome C5-C6, C6-C7  . ADD (attention deficit disorder)   . Decreased libido   . Depression   . Diabetes mellitus type 2, uncontrolled, with complications     CAD, orthostatic hypotension, erectile dysfunction  . Pulmonary nodule, left     Evaluated 04/2011 by pulmonology - felt to be benign granuloma  . Peripheral edema   . Coronary artery disease, non-occlusive     Mild nonobstructive CAD by cath 08/2011  . Systolic congestive heart failure     By echo 2013 at [facility] - reported EF 45% per pt  . Colitis     with surface exudate  . Hemorrhoids     small  . Gastritis   . Clostridium difficile infection 02/15/2012    causing pseudomembranosu colitis  . Diastolic heart failure     grade 2 per echocardiogram (2011)  . Right bundle branch block and left posterior fascicular block   . Sleep apnea     occassionally uses cpap  . Myasthenia gravis     Status post thymectomy April 2012, hx of plasmapheresis    Past Surgical History  Procedure Laterality Date  . Thymectomy  april 2012  . Colonoscopy  11/13/2011    Procedure: COLONOSCOPY;  Surgeon: Theda Belfast, MD;  Location: [facility];  Service: Endoscopy;  Laterality: N/A;  . Cardiac catheterization    . Esophagogastroduodenoscopy  05/13/2012  Procedure: ESOPHAGOGASTRODUODENOSCOPY (EGD);  Surgeon: Iva Boop, MD;  Location: [facility];  Service: Endoscopy;  Laterality: N/A;  needs PAT w/ dx of Myastenia gravis and multiple meds. Pt requested lidocaine to numb IV site prior to start.  Very needle phobic.    Family History  Problem Relation Age of Onset  . Breast cancer Mother   . Other Father     Beck's Sarcoid  . Colon cancer Neg Hx   . Prostate cancer Neg Hx     History  Substance Use Topics  . Smoking status: Former Smoker -- 2.50 packs/day for 30 years    Quit date: 06/09/1991  .  Smokeless tobacco: Never Used  . Alcohol Use: No      Review of Systems  Constitutional: Negative for fever and chills.  Respiratory: Positive for chest tightness and shortness of breath. Negative for cough.   Cardiovascular: Positive for leg swelling.  Genitourinary: Negative for difficulty urinating.  Musculoskeletal: Negative for back pain.  All other systems reviewed and are negative.    Allergies  Aminoglycosides; Beta adrenergic blockers; Calcium channel blockers; Metoprolol; Neuromuscular blocking agents; Other; Penicillins; and Quinine derivatives  Home Medications   Current Outpatient Rx  Name  Route  Sig  Dispense  Refill  . Alum & Mag Hydroxide-Simeth (MAGIC MOUTHWASH W/LIDOCAINE) SOLN   Oral   Take 10 mLs by mouth 4 (four) times daily as needed (pain). Swish and spit.   250 mL   0   . benazepril (LOTENSIN) 40 MG tablet   Oral   Take 40 mg by mouth daily before breakfast.         . metFORMIN (GLUCOPHAGE) 500 MG tablet   Oral   Take 1 tablet (500 mg total) by mouth 2 (two) times daily with a meal.   60 tablet   0   . mirtazapine (REMERON) 30 MG tablet   Oral   Take 1 tablet (30 mg total) by mouth at bedtime.   30 tablet   1   . mycophenolate (CELLCEPT) 500 MG tablet   Oral   Take 500 mg by mouth 2 (two) times daily.         Marland Kitchen oxyCODONE (OXY IR/ROXICODONE) 5 MG immediate release tablet   Oral   Take 1 tablet (5 mg total) by mouth every 4 (four) hours as needed. For pain   120 tablet   0   . pantoprazole (PROTONIX) 40 MG tablet   Oral   Take 1 tablet (40 mg total) by mouth daily.   30 tablet   6   . predniSONE (DELTASONE) 5 MG tablet   Oral   Take 15 mg by mouth daily.         Marland Kitchen pyridostigmine (MESTINON) 60 MG tablet   Oral   Take 60 mg by mouth 5 (five) times daily. Medication starts to wear off closer to 3 hours         . spironolactone (ALDACTONE) 25 MG tablet   Oral   Take 25 mg by mouth daily.         Marland Kitchen torsemide  (DEMADEX) 20 MG tablet   Oral   Take 3 tablets (60 mg total) by mouth 2 (two) times daily.   180 tablet   3   . zolpidem (AMBIEN) 10 MG tablet   Oral   Take 5 mg by mouth at bedtime.         . metolazone (ZAROXOLYN) 5 MG tablet   Oral  Take 1 tablet (5 mg total) by mouth daily.           BP 127/81  Pulse 84  Temp(Src) 97.5 F (36.4 C) (Oral)  Resp 18  Ht 5\' 7"  (1.702 m)  Wt 184 lb 6 oz (83.632 kg)  BMI 28.87 kg/m2  SpO2 100%  Physical Exam  Nursing note and vitals reviewed. Constitutional: He appears well-developed and well-nourished.  HENT:  Head: Normocephalic and atraumatic.  Eyes: EOM are normal.  Neck: JVD present.  Cardiovascular: Normal rate and regular rhythm.   Murmur heard. Abdominal: Soft.  Musculoskeletal: He exhibits edema.  Neurological: He is alert.  Skin: Skin is warm. No rash noted.    ED Course  Procedures (including critical care time)  Labs Reviewed  CBC - Abnormal; Notable for the following:    RBC 4.18 (*)    Hemoglobin 11.9 (*)    HCT 32.4 (*)    MCV 77.5 (*)    MCHC 36.7 (*)    All other components within normal limits  BASIC METABOLIC PANEL - Abnormal; Notable for the following:    Sodium 125 (*)    Chloride 88 (*)    Glucose, Bld 305 (*)    BUN 74 (*)    GFR calc non Af Amer 58 (*)    GFR calc Af Amer 67 (*)    All other components within normal limits  PRO B NATRIURETIC PEPTIDE - Abnormal; Notable for the following:    Pro B Natriuretic peptide (BNP) 2788.0 (*)    All other components within normal limits  POCT I-STAT TROPONIN I - Abnormal; Notable for the following:    Troponin i, poc 0.09 (*)    All other components within normal limits   Dg Chest 2 View  08/28/2012  *RADIOLOGY REPORT*  Clinical Data: Shortness of breath, chest pain  CHEST - 2 VIEW  Comparison: 08/20/2012  Findings: Cardiomediastinal silhouette is stable.  No acute infiltrate or pleural effusion.  No pulmonary edema.  Mild right basilar atelectasis.  Status post median sternotomy.  IMPRESSION: No active disease.  Mild right basilar atelectasis.   Original Report Authenticated By: Natasha Mead, M.D.      1. Chronic combined systolic and diastolic congestive heart failure     RA sat is 100% and I interpret to be normal  ECG at time 15:01 shows SR with PAC's at rate 84.  RBBB.  Poor R wave progression seen on ECG from 08/22/12 is no longer present.  May be due to lead placement.  Interpretation is abn ECG with no sig new changes.      Pt seen by [facility], spoke to attending who agrees pt needs med adjustment and can be sent home and follow up with PCP.   MDM  Pt with CHF exacerbation symptoms, possibly fluid overload.  PT's mild elevated troponin in my opinion is related to CHF and not ACS.  ECG is not changed, no current CP now.  I have consulted his PCP group, [facility] to see pt and help         Gavin Pound. Oletta Lamas, MD 08/28/12 2114

## 2012-08-28 NOTE — ED Notes (Signed)
Pt seen recently for hyperkalemia.  Pt presents today with chief c/o SOB and increased edema to BLE.   Pt also states he's had a "little tingling" to the chest, but denies CP at this time.

## 2012-08-28 NOTE — ED Notes (Signed)
Patient reports fluid pill change to torsemide last week.  He was on lasix.  Patient is reporting dizziness.  He has noted edema to bil lower extremities.  He complains of sob, denies chest pain.  Patient aware of pending admission but states he does not want to be admitted.  He is seen by Kendell Bane MD for his Alvera Novel on Monday morning.  [facility]

## 2012-08-29 NOTE — Consult Note (Signed)
I did not see the patient in the ED, but discussed the case with the resident team and agree with the plan documented.

## 2012-08-31 ENCOUNTER — Encounter: Payer: Self-pay | Admitting: Internal Medicine

## 2012-08-31 ENCOUNTER — Ambulatory Visit (INDEPENDENT_AMBULATORY_CARE_PROVIDER_SITE_OTHER): Payer: Medicare Other | Admitting: Internal Medicine

## 2012-08-31 VITALS — BP 143/74 | HR 76 | Temp 97.9°F | Ht 67.0 in | Wt 186.3 lb

## 2012-08-31 DIAGNOSIS — R609 Edema, unspecified: Secondary | ICD-10-CM

## 2012-08-31 DIAGNOSIS — I509 Heart failure, unspecified: Secondary | ICD-10-CM

## 2012-08-31 DIAGNOSIS — I5042 Chronic combined systolic (congestive) and diastolic (congestive) heart failure: Secondary | ICD-10-CM

## 2012-08-31 DIAGNOSIS — E871 Hypo-osmolality and hyponatremia: Secondary | ICD-10-CM

## 2012-08-31 DIAGNOSIS — E1165 Type 2 diabetes mellitus with hyperglycemia: Secondary | ICD-10-CM

## 2012-08-31 DIAGNOSIS — N179 Acute kidney failure, unspecified: Secondary | ICD-10-CM

## 2012-08-31 DIAGNOSIS — I10 Essential (primary) hypertension: Secondary | ICD-10-CM

## 2012-08-31 LAB — BASIC METABOLIC PANEL
BUN: 71 mg/dL — ABNORMAL HIGH (ref 6–23)
CO2: 30 mEq/L (ref 19–32)
Calcium: 9.5 mg/dL (ref 8.4–10.5)
Glucose, Bld: 339 mg/dL — ABNORMAL HIGH (ref 70–99)

## 2012-08-31 MED ORDER — TORSEMIDE 20 MG PO TABS: 80.0000 mg | ORAL_TABLET | Freq: Two times a day (BID) | ORAL | Status: DC

## 2012-08-31 NOTE — Patient Instructions (Addendum)
1.  Increase the Torsemide to 4 tablets twice daily.  2.  We will work to get your a pair of compression stockings for your legs to help with the swelling.  3.  Look over the forms I gave you and discuss with your mom and son about your thoughts about DNR and your care.  You want to also work on filling out a [employer] Power of Attorney to designate who you would like to make decisions for you if you can't make them yourself.  4. Follow up on April 4th to see how you are doing.   Check out [email or web address] for some help on Healthcare Power of [address]

## 2012-08-31 NOTE — Progress Notes (Signed)
Subjective:   Patient ID: [name] male   DOB: 1946-02-06 67 y.o.   MRN: [id number]  HPI: [name] is a 67 y.o. man who presents to clinic today for follow up on his chronic medical conditions including hyponatremia, hypertension, diabetes, chronic diastolic heart failure, CKD stage III, and peripheral edema.    He wants to discuss end of life issues including DNR and healthcare power of attorney.  He states that he knows he is sick and that he "doesn't know how much time he has left."  He states that he has decided that he doesn't want extraordinary measures taken like CPR, intubation, or life support.    Has been laying down.  Some dizziness "wobbly" little bit better when changing positions.  Dietary indiscretion on Sunday when was in the ED.    End of life, DNR possible etc.  HCPOA.   Past Medical History  Diagnosis Date  . Sinus tachycardia 2006    a. Nonobstructive CAD by cath 2013. b. s/p atrial tachycardia ablation 08/2011 (sinus node modification)  . Orthostatic hypotension   . Unspecified essential hypertension   . Hyperlipidemia   . Cervical spondylosis with radiculopathy   . Syrinx     T5-T6  . Cord compression     Cord compression syndrome C5-C6, C6-C7  . ADD (attention deficit disorder)   . Decreased libido   . Depression   . Diabetes mellitus type 2, uncontrolled, with complications     CAD, orthostatic hypotension, erectile dysfunction  . Pulmonary nodule, left     Evaluated 04/2011 by pulmonology - felt to be benign granuloma  . Peripheral edema   . Coronary artery disease, non-occlusive     Mild nonobstructive CAD by cath 08/2011  . Systolic congestive heart failure     By echo 2013 at [facility] - reported EF 45% per pt  . Colitis     with surface exudate  . Hemorrhoids     small  . Gastritis   . Clostridium difficile infection 02/15/2012    causing pseudomembranosu colitis  . Diastolic heart failure     grade 2 per echocardiogram (2011)  . Right  bundle branch block and left posterior fascicular block   . Sleep apnea     occassionally uses cpap  . 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 Alum & Mag Hydroxide-Simeth (MAGIC MOUTHWASH W/LIDOCAINE) SOLN Take 10 mLs by mouth 4 (four) times daily as needed (pain). Swish and spit.  250 mL  0  . benazepril (LOTENSIN) 40 MG tablet Take 40 mg by mouth daily before breakfast.      . metFORMIN (GLUCOPHAGE) 500 MG tablet Take 1 tablet (500 mg total) by mouth 2 (two) times daily with a meal.  60 tablet  0  . metolazone (ZAROXOLYN) 5 MG tablet Take 1 tablet (5 mg total) by mouth daily.      . mirtazapine (REMERON) 30 MG tablet Take 1 tablet (30 mg total) by mouth at bedtime.  30 tablet  1  . mycophenolate (CELLCEPT) 500 MG tablet Take 500 mg by mouth 2 (two) times daily.      Marland Kitchen oxyCODONE (OXY IR/ROXICODONE) 5 MG immediate release tablet Take 1 tablet (5 mg total) by mouth every 4 (four) hours as needed. For pain  120 tablet  0  . pantoprazole (PROTONIX) 40 MG tablet Take 1 tablet (40 mg  total) by mouth daily.  30 tablet  6  . predniSONE (DELTASONE) 5 MG tablet Take 15 mg by mouth daily.      Marland Kitchen pyridostigmine (MESTINON) 60 MG tablet Take 60 mg by mouth 5 (five) times daily. Medication starts to wear off closer to 3 hours      . spironolactone (ALDACTONE) 25 MG tablet Take 25 mg by mouth daily.      Marland Kitchen torsemide (DEMADEX) 20 MG tablet Take 3 tablets (60 mg total) by mouth 2 (two) times daily.  180 tablet  3  . zolpidem (AMBIEN) 10 MG tablet Take 5 mg by mouth at bedtime.      . [DISCONTINUED] pantoprazole (PROTONIX) 40 MG tablet Take 1 tablet (40 mg total) by mouth daily.  30 tablet  2   No current facility-administered medications for this visit.   Family History  Problem Relation Age of Onset  . Breast cancer Mother   . Other Father     Beck's Sarcoid  . Colon cancer Neg Hx   . Prostate  cancer Neg Hx    History   Social History  . Marital Status: Single    Spouse Name: N/A    Number of Children: 1  . Years of Education: N/A   Occupational History  . Retired     Surveyor, minerals   Social History Main Topics  . Smoking status: Former Smoker -- 2.50 packs/day for 30 years    Quit date: 06/09/1991  . Smokeless tobacco: Never Used  . Alcohol Use: No  . Drug Use: No  . Sexually Active: None   Other Topics Concern  . None   Social History Narrative   Has a single man's lifestyle and will remind anyone of it when asked.   Prefers to see only male physicians   Retired Surveyor, minerals   Review of Systems: A full 12 system ROS is negative except as noted in the HPI and A&P.   Objective:  Physical Exam: Filed Vitals:   08/31/12 1100  BP: 159/78  Pulse: 87  Temp: 97.9 F (36.6 C)  TempSrc: Oral  Height: 5\' 7"  (1.702 m)  Weight: 186 lb 4.8 oz (84.505 kg)  SpO2: 98%   Constitutional: Vital signs reviewed.  Patient is a well-developed and well-nourished elderly appearing man in no acute distress and cooperative with exam. Alert and oriented x3.  Head: Normocephalic and atraumatic Ear: TM normal bilaterally Mouth: no erythema or exudates, MMM Eyes: PERRL, EOMI, conjunctivae normal, No scleral icterus.  Neck: Supple, Trachea midline normal ROM, No JVD, mass, thyromegaly, or carotid bruit present.  Cardiovascular: RRR with occasional ectopic beats, S1 normal, S2 normal, no MRG, pulses symmetric and intact bilaterally Pulmonary/Chest: CTAB, no wheezes, rales, or rhonchi Abdominal: Soft. Mild periumbilical tenderness, non-distended, bowel sounds are normal, no masses, organomegaly, or guarding present.  GU: no CVA tenderness Musculoskeletal: No joint deformities, erythema, or stiffness, ROM full and no nontender Hematology: no cervical, inginal, or axillary adenopathy.  Neurological: A&O x3, Strength is normal and symmetric bilaterally, cranial nerve II-XII are grossly  intact, no focal motor deficit, sensory intact to light touch bilaterally.  Skin: 3+ pitting edema to the knees bilaterally.  Warm, dry and intact. No rash, cyanosis, or clubbing.  Psychiatric: anxious mood and labile affect. speech is goal directed.  Behavior is anxious.  Judgment and insight appear poor.  Thought content normal. Cognition is intact and memory are normal.   Assessment & Plan:  We discussed end of life issues today  and he is fixated on the DNR.  When we discussed actual goals of care he states that "when it is my time, I just want to go."  We introduced the topic of the MOST form as well as establishing a Health care power of attorney should he not be able to verbalize his own wishes.  We did not place the DNR order today until he has a chance to discuss this with his son and mother.  We reinforced the fact that DNR does not mean we do not treat but that we would avoid testing and procedures that are highly invasive and not focused on the quality of life.

## 2012-09-01 ENCOUNTER — Telehealth: Payer: Self-pay | Admitting: *Deleted

## 2012-09-01 NOTE — Telephone Encounter (Signed)
Pt called and left a message stating he was not feeling well and something had to be done. He was feeling dizzy and swelling.  I talked with Dr Tonny Branch and we have no open appointments tomorrow. Pt to go to ED or [facility] for evaluation.  Returned call to pt and no answer.  Above message left.

## 2012-09-03 ENCOUNTER — Other Ambulatory Visit: Payer: Self-pay

## 2012-09-03 ENCOUNTER — Encounter (HOSPITAL_COMMUNITY): Payer: Self-pay | Admitting: Cardiology

## 2012-09-03 ENCOUNTER — Emergency Department (HOSPITAL_COMMUNITY)
Admission: EM | Admit: 2012-09-03 | Discharge: 2012-09-03 | Disposition: A | Discharge status: Home / Self Care | Payer: Medicare Other | Attending: Emergency Medicine | Admitting: Emergency Medicine

## 2012-09-03 ENCOUNTER — Emergency Department (HOSPITAL_COMMUNITY): Payer: Medicare Other

## 2012-09-03 DIAGNOSIS — Z87891 Personal history of nicotine dependence: Secondary | ICD-10-CM | POA: Insufficient documentation

## 2012-09-03 DIAGNOSIS — I10 Essential (primary) hypertension: Secondary | ICD-10-CM | POA: Insufficient documentation

## 2012-09-03 DIAGNOSIS — I509 Heart failure, unspecified: Secondary | ICD-10-CM

## 2012-09-03 DIAGNOSIS — Z8619 Personal history of other infectious and parasitic diseases: Secondary | ICD-10-CM | POA: Insufficient documentation

## 2012-09-03 DIAGNOSIS — G7000 Myasthenia gravis without (acute) exacerbation: Secondary | ICD-10-CM | POA: Insufficient documentation

## 2012-09-03 DIAGNOSIS — Z79899 Other long term (current) drug therapy: Secondary | ICD-10-CM | POA: Insufficient documentation

## 2012-09-03 DIAGNOSIS — Z8679 Personal history of other diseases of the circulatory system: Secondary | ICD-10-CM | POA: Insufficient documentation

## 2012-09-03 DIAGNOSIS — Z9989 Dependence on other enabling machines and devices: Secondary | ICD-10-CM | POA: Insufficient documentation

## 2012-09-03 DIAGNOSIS — F988 Other specified behavioral and emotional disorders with onset usually occurring in childhood and adolescence: Secondary | ICD-10-CM | POA: Insufficient documentation

## 2012-09-03 DIAGNOSIS — Z8709 Personal history of other diseases of the respiratory system: Secondary | ICD-10-CM | POA: Insufficient documentation

## 2012-09-03 DIAGNOSIS — Z8739 Personal history of other diseases of the musculoskeletal system and connective tissue: Secondary | ICD-10-CM | POA: Insufficient documentation

## 2012-09-03 DIAGNOSIS — F3289 Other specified depressive episodes: Secondary | ICD-10-CM | POA: Insufficient documentation

## 2012-09-03 DIAGNOSIS — R42 Dizziness and giddiness: Secondary | ICD-10-CM | POA: Insufficient documentation

## 2012-09-03 DIAGNOSIS — IMO0001 Reserved for inherently not codable concepts without codable children: Secondary | ICD-10-CM | POA: Insufficient documentation

## 2012-09-03 DIAGNOSIS — Z8719 Personal history of other diseases of the digestive system: Secondary | ICD-10-CM | POA: Insufficient documentation

## 2012-09-03 DIAGNOSIS — I2510 Atherosclerotic heart disease of native coronary artery without angina pectoris: Secondary | ICD-10-CM | POA: Insufficient documentation

## 2012-09-03 DIAGNOSIS — I951 Orthostatic hypotension: Secondary | ICD-10-CM | POA: Insufficient documentation

## 2012-09-03 DIAGNOSIS — E785 Hyperlipidemia, unspecified: Secondary | ICD-10-CM | POA: Insufficient documentation

## 2012-09-03 DIAGNOSIS — G4730 Sleep apnea, unspecified: Secondary | ICD-10-CM | POA: Insufficient documentation

## 2012-09-03 DIAGNOSIS — E871 Hypo-osmolality and hyponatremia: Secondary | ICD-10-CM | POA: Insufficient documentation

## 2012-09-03 DIAGNOSIS — F329 Major depressive disorder, single episode, unspecified: Secondary | ICD-10-CM | POA: Insufficient documentation

## 2012-09-03 DIAGNOSIS — M7989 Other specified soft tissue disorders: Secondary | ICD-10-CM | POA: Insufficient documentation

## 2012-09-03 DIAGNOSIS — R609 Edema, unspecified: Secondary | ICD-10-CM | POA: Insufficient documentation

## 2012-09-03 LAB — URINALYSIS, ROUTINE W REFLEX MICROSCOPIC
Bilirubin Urine: NEGATIVE
Ketones, ur: NEGATIVE mg/dL
Leukocytes, UA: NEGATIVE
Nitrite: NEGATIVE
Protein, ur: 30 mg/dL — AB
Urobilinogen, UA: 0.2 mg/dL (ref 0.0–1.0)
pH: 6 (ref 5.0–8.0)

## 2012-09-03 LAB — URINE MICROSCOPIC-ADD ON

## 2012-09-03 LAB — BASIC METABOLIC PANEL
CO2: 21 mEq/L (ref 19–32)
Calcium: 9.7 mg/dL (ref 8.4–10.5)
Creatinine, Ser: 1.63 mg/dL — ABNORMAL HIGH (ref 0.50–1.35)
GFR calc Af Amer: 48 mL/min — ABNORMAL LOW (ref >90)
GFR calc non Af Amer: 42 mL/min — ABNORMAL LOW (ref >90)
Sodium: 123 mEq/L — ABNORMAL LOW (ref 135–145)

## 2012-09-03 LAB — CBC WITH DIFFERENTIAL/PLATELET
Basophils Absolute: 0.1 10*3/uL (ref 0.0–0.1)
Basophils Relative: 1 % (ref 0–1)
Eosinophils Relative: 9 % — ABNORMAL HIGH (ref 0–5)
Lymphocytes Relative: 12 % (ref 12–46)
MCHC: 36.7 g/dL — ABNORMAL HIGH (ref 30.0–36.0)
MCV: 75.5 fL — ABNORMAL LOW (ref 78.0–100.0)
Neutro Abs: 6.2 10*3/uL (ref 1.7–7.7)
Platelets: 304 10*3/uL (ref 150–400)
RDW: 12.8 % (ref 11.5–15.5)
WBC: 9.2 10*3/uL (ref 4.0–10.5)

## 2012-09-03 LAB — PRO B NATRIURETIC PEPTIDE: Pro B Natriuretic peptide (BNP): 2552 pg/mL — ABNORMAL HIGH (ref 0–125)

## 2012-09-03 NOTE — ED Notes (Signed)
Pt now in room.   

## 2012-09-03 NOTE — Discharge Instructions (Signed)
Heart Failure Heart failure (HF) is a condition in which the heart has trouble pumping blood. This means your heart does not pump blood efficiently for your body to work well. In some cases of HF, fluid may back up into your lungs or you may have swelling (edema) in your lower legs. HF is a long-term (chronic) condition. It is important for you to take good care of yourself and follow your caregiver's treatment plan. CAUSES   Health conditions:  High blood pressure (hypertension) causes the heart muscle to work harder than normal. When pressure in the blood vessels is high, the heart needs to pump (contract) with more force in order to circulate blood throughout the body. High blood pressure eventually causes the heart to become stiff and weak.  Coronary artery disease (CAD) is the buildup of cholesterol and fat (plaques) in the arteries of the heart. The blockage in the arteries deprives the heart muscle of oxygen and blood. This can cause chest pain and may lead to a heart attack. High blood pressure can also contribute to CAD.  Heart attack (myocardial infarction) occurs when 1 or more arteries in the heart become blocked. The loss of oxygen damages the muscle tissue of the heart. When this happens, part of the heart muscle dies. The injured tissue does not contract as well and weakens the heart's ability to pump blood.  Abnormal heart valves can cause HF when the heart valves do not open and close properly. This makes the heart muscle pump harder to keep the blood flowing.  Heart muscle disease (cardiomyopathy or myocarditis) is damage to the heart muscle from a variety of causes. These can include drug or alcohol abuse, infections, or unknown reasons. These can increase the risk of HF.  Lung disease makes the heart work harder because the lungs do not work properly. This can cause a strain on the heart leading it to fail.  Diabetes increases the risk of HF. High blood sugar contributes to high  fat (lipid) levels in the blood. Diabetes can also cause slow damage to tiny blood vessels that carry important nutrients to the heart muscle. When the heart does not get enough oxygen and food, it can cause the heart to become weak and stiff. This leads to a heart that does not contract efficiently.  Other diseases can contribute to HF. These include abnormal heart rhythms, thyroid problems, and low blood counts (anemia).  Unhealthy lifestyle habits:  Obesity.  Smoking.  Eating foods high in fat and cholesterol.  Eating or drinking beverages high in salt.  Drug or alcohol abuse.  Lack of exercise. SYMPTOMS  HF symptoms may vary and can be hard to detect. Symptoms may include:  Shortness of breath with activity, such as climbing stairs.  Persistent cough.  Swelling of the feet, ankles, legs, or abdomen.  Unexplained weight gain.  Difficulty breathing when lying flat.  Waking from sleep because of the need to sit up and get more air.  Rapid heartbeat.  Fatigue and loss of energy.  Feeling lightheaded or close to fainting. DIAGNOSIS  A diagnosis of HF is based on your history, symptoms, physical examination, and diagnostic tests. Diagnostic tests for HF may include:  EKG.  Chest X-ray.  Blood tests.  Exercise stress test.  Blood oxygen test (arterial blood gas).  Evaluation by a heart doctor (cardiologist).  Ultrasound evaluation of the heart (echocardiogram).  Heart artery test to look for blockages (angiogram).  Radioactive imaging to look at the heart (radionuclide  test). TREATMENT  Treatment is aimed at managing the symptoms of HF. Medicines, lifestyle changes, or surgical intervention may be necessary to treat HF.  Medicines to help treat HF may include:  Angiotensin-converting enzyme (ACE) inhibitors. These block the effects of a blood protein called angiotensin-converting enzyme. ACE inhibitors relax (dilate) the blood vessels and help lower blood  pressure. This decreases the workload of the heart, slows the progression of HF, and improves symptoms.  Angiotensin receptor blockers (ARBs). These medications work similar to ACE inhibitors. ARBs may be an alternative for people who cannot tolerate an ACE inhibitor.  Aldosterone antagonists. This medication helps get rid of extra fluid from your body. This lowers the volume of blood the heart has to pump.  Water pills (diuretics). Diuretics cause the kidneys to remove salt and water from the blood. The extra fluid is removed by urination. By removing extra fluid from the body, diuretics help lower the workload of the heart and help prevent fluid buildup in the lungs so breathing is easier.  Beta blockers. These prevent the heart from beating too fast and improve heart muscle strength. Beta blockers help maintain a normal heart rate, control blood pressure, and improve HF symptoms.  Digitalis. This increases the force of the heartbeat and may be helpful to people with HF or heart rhythm problems.  Healthy lifestyle changes include:  Stopping smoking.  Eating a healthy diet. Avoid foods high in fat. Avoid foods fried in oil or made with fat. A dietician can help with healthy food choices.  Limiting how much salt you eat.  Limiting alcohol intake to no more than 1 drink per day for women and 2 drinks per day for men. Drinking more than that is harmful to your heart. If your heart has already been damaged by alcohol or you have severe HF, drinking alcohol should be stopped completely.  Exercising as directed by your caregiver.  Surgical treatment for HF may include:  Procedures to open blocked arteries, repair damaged heart valves, or remove damaged heart muscle tissue.  A pacemaker to help heart muscle function and to control certain abnormal heart rhythms.  A defibrillator to possibly prevent sudden cardiac death. HOME CARE INSTRUCTIONS   Activity level. Your caregiver can help you  determine what type of exercise program may be helpful. It is important to maintain your strength. Pace your physical activity to avoid shortness of breath or chest pain. Rest for 1 hour before and after meals. A cardiac rehabilitation program may be helpful to some people with HF.  Diet. Eat a heart healthy diet. Food choices should be low in saturated fat and cholesterol. Talk to a dietician to learn about heart healthy foods.  Salt intake. When you have HF, you need to limit the amount of salt you eat. Eat less than 1500 milligrams (mg) of salt per day or as recommended by your caregiver.  Weight monitoring. Weigh yourself every day. You should weigh yourself in the morning after you urinate and before you eat breakfast. Wear the same amount of clothing each time you weigh yourself. Record your weight daily. Bring your recorded weights to your clinic visits. Tell your caregiver right away if you have gained 3 lb/1.4 kg in 1 day, or 5 lb/2.3 kg in a week or whatever amount you were told to report.  Blood pressure monitoring. This should be done as directed by your caregiver. A home blood pressure cuff can be purchased at a drugstore. Record your blood pressure numbers and  bring them to your clinic visits. Tell your caregiver if you become dizzy or lightheaded upon standing up.  Smoking. If you are currently a smoker, it is time to quit. Nicotine makes your heart work harder by causing your blood vessels to constrict. Do not use nicotine gum or patches before talking to your caregiver.  Follow up. Be sure to schedule a follow-up visit with your caregiver. Keep all your appointments. SEEK MEDICAL CARE IF:   Your weight increases by 3 lb/1.4 kg in 1 day or 5 lb/2.3 kg in a week.  You notice increasing shortness of breath that is unusual for you. This may happen during rest, sleep, or with activity.  You cough more than normal, especially with physical activity.  You notice more swelling in your  hands, feet, ankles, or belly (abdomen).  You are unable to sleep because it is hard to breathe.  You cough up bloody mucus (sputum).  You begin to feel "jumping" or "fluttering" sensations (palpitations) in your chest. SEEK IMMEDIATE MEDICAL CARE IF:   You have severe chest pain or pressure which may include symptoms such as:  Pain or pressure in the arms, neck, jaw, or back.  Feeling sweaty.  Feeling sick to your stomach (nauseous).  Feeling short of breath while at rest.  Having a fast or irregular heartbeat.  You experience stroke symptoms. These symptoms include:  Facial weakness or numbness.  Weakness or numbness in an arm, leg, or on one side of your body.  Blurred vision.  Difficulty talking or thinking.  Dizziness or fainting.  Severe headache. MAKE SURE YOU:   Understand these instructions.  Will watch your condition.  Will get help right away if you are not doing well or get worse. Document Released: 05/25/2005 Document Revised: 11/24/2011 Document Reviewed: 09/06/2009 [facility] Patient Information 2013 [place], [place].  Hyponatremia  Hyponatremia is when the amount of salt (sodium) in your blood is too low. When sodium levels are low, your cells will absorb extra water and swell. The swelling happens throughout the body, but it mostly affects the brain. Severe brain swelling (cerebral edema), seizures, or coma can happen.  CAUSES   Heart, kidney, or liver problems.  Thyroid problems.  Adrenal gland problems.  Severe vomiting and diarrhea.  Certain medicines or illegal drugs.  Dehydration.  Drinking too much water.  Low-sodium diet. SYMPTOMS   Nausea and vomiting.  Confusion.  Lethargy.  Agitation.  Headache.  Twitching or shaking (seizures).  Unconsciousness.  Appetite loss.  Muscle weakness and cramping. DIAGNOSIS  Hyponatremia is identified by a simple blood test. Your caregiver will perform a history and physical exam to  try to find the cause and type of hyponatremia. Other tests may be needed to measure the amount of sodium in your blood and urine. TREATMENT  Treatment will depend on the cause.   Fluids may be given through the vein (IV).  Medicines may be used to correct the sodium imbalance. If medicines are causing the problem, they will need to be adjusted.  Water or fluid intake may be restricted to restore proper balance. The speed of correcting the sodium problem is very important. If the problem is corrected too fast, nerve damage (sometimes unchangeable) can happen. HOME CARE INSTRUCTIONS   Only take medicines as directed by your caregiver. Many medicines can make hyponatremia worse. Discuss all your medicines with your caregiver.  Carefully follow any recommended diet, including any fluid restrictions.  You may be asked to repeat lab tests. Follow  these directions.  Avoid alcohol and recreational drugs. SEEK MEDICAL CARE IF:   You develop worsening nausea, fatigue, headache, confusion, or weakness.  Your original hyponatremia symptoms return.  You have problems following the recommended diet. SEEK IMMEDIATE MEDICAL CARE IF:   You have a seizure.  You faint.  You have ongoing diarrhea or vomiting. MAKE SURE YOU:   Understand these instructions.  Will watch your condition.  Will get help right away if you are not doing well or get worse. Document Released: 05/15/2002 Document Revised: 08/17/2011 Document Reviewed: 11/09/2010 [facility] Patient Information 2013 [place], [place].

## 2012-09-03 NOTE — ED Notes (Signed)
Pt dc to home.  Pt is refusing all test and scans at this time. Pt dc to home with ama.  Pt states understanding to instructions.  Pt to exit via w/c.

## 2012-09-03 NOTE — ED Notes (Signed)
Dr.Pollina shown results of Istat Trop. Results ED-Lab

## 2012-09-03 NOTE — ED Provider Notes (Signed)
History     CSN: [id number]  Arrival date & time 09/03/12  1641   First MD Initiated Contact with Patient 09/03/12 1737      Chief Complaint  Patient presents with  . Shortness of Breath    (Consider location/radiation/quality/duration/timing/severity/associated sxs/prior treatment) HPI Comments: Patient presents to the ER for evaluation of increasing shortness of breath for a period of 3 days. Patient has noticed progressively worsening swelling of his legs as well. Patient has a history of congestive heart failure. He reports that he was recently switched from Lasix to torsemide because of renal insufficiency. He feels like since the switch, he has not been urinating as much and he has been building up fluid in his legs. This is not experiencing any chest pain. He has not had fever or cough.  Patient is a 67 y.o. male presenting with shortness of breath.  Shortness of Breath   Past Medical History  Diagnosis Date  . Sinus tachycardia 2006    a. Nonobstructive CAD by cath 2013. b. s/p atrial tachycardia ablation 08/2011 (sinus node modification)  . Orthostatic hypotension   . Unspecified essential hypertension   . Hyperlipidemia   . Cervical spondylosis with radiculopathy   . Syrinx     T5-T6  . Cord compression     Cord compression syndrome C5-C6, C6-C7  . ADD (attention deficit disorder)   . Decreased libido   . Depression   . Diabetes mellitus type 2, uncontrolled, with complications     CAD, orthostatic hypotension, erectile dysfunction  . Pulmonary nodule, left     Evaluated 04/2011 by pulmonology - felt to be benign granuloma  . Peripheral edema   . Coronary artery disease, non-occlusive     Mild nonobstructive CAD by cath 08/2011  . Systolic congestive heart failure     By echo 2013 at [facility] - reported EF 45% per pt  . Colitis     with surface exudate  . Hemorrhoids     small  . Gastritis   . Clostridium difficile infection 02/15/2012    causing pseudomembranosu  colitis  . Diastolic heart failure     grade 2 per echocardiogram (2011)  . Right bundle branch block and left posterior fascicular block   . Sleep apnea     occassionally uses cpap  . Myasthenia gravis     Status post thymectomy April 2012, hx of plasmapheresis    Past Surgical History  Procedure Laterality Date  . Thymectomy  april 2012  . Colonoscopy  11/13/2011    Procedure: COLONOSCOPY;  Surgeon: Theda Belfast, MD;  Location: [facility];  Service: Endoscopy;  Laterality: N/A;  . Cardiac catheterization    . Esophagogastroduodenoscopy  05/13/2012    Procedure: ESOPHAGOGASTRODUODENOSCOPY (EGD);  Surgeon: Iva Boop, MD;  Location: [facility];  Service: Endoscopy;  Laterality: N/A;  needs PAT w/ dx of Myastenia gravis and multiple meds. Pt requested lidocaine to numb IV site prior to start.  Very needle phobic.    Family History  Problem Relation Age of Onset  . Breast cancer Mother   . Other Father     Beck's Sarcoid  . Colon cancer Neg Hx   . Prostate cancer Neg Hx     History  Substance Use Topics  . Smoking status: Former Smoker -- 2.50 packs/day for 30 years    Quit date: 06/09/1991  . Smokeless tobacco: Never Used  . Alcohol Use: No      Review of Systems  Respiratory:  Positive for shortness of breath.   Cardiovascular: Positive for leg swelling.  All other systems reviewed and are negative.    Allergies  Aminoglycosides; Beta adrenergic blockers; Calcium channel blockers; Metoprolol; Neuromuscular blocking agents; Other; Penicillins; and Quinine derivatives  Home Medications   Current Outpatient Rx  Name  Route  Sig  Dispense  Refill  . Alum & Mag Hydroxide-Simeth (MAGIC MOUTHWASH W/LIDOCAINE) SOLN   Oral   Take 10 mLs by mouth 4 (four) times daily as needed (pain). Swish and spit.   250 mL   0   . benazepril (LOTENSIN) 40 MG tablet   Oral   Take 40 mg by mouth daily before breakfast.         . metFORMIN (GLUCOPHAGE) 500 MG tablet    Oral   Take 1 tablet (500 mg total) by mouth 2 (two) times daily with a meal.   60 tablet   0   . metolazone (ZAROXOLYN) 5 MG tablet   Oral   Take 1 tablet (5 mg total) by mouth daily.         . mirtazapine (REMERON) 30 MG tablet   Oral   Take 1 tablet (30 mg total) by mouth at bedtime.   30 tablet   1   . mycophenolate (CELLCEPT) 500 MG tablet   Oral   Take 500 mg by mouth 2 (two) times daily.         Marland Kitchen oxyCODONE (OXY IR/ROXICODONE) 5 MG immediate release tablet   Oral   Take 1 tablet (5 mg total) by mouth every 4 (four) hours as needed. For pain   120 tablet   0   . pantoprazole (PROTONIX) 40 MG tablet   Oral   Take 1 tablet (40 mg total) by mouth daily.   30 tablet   6   . predniSONE (DELTASONE) 5 MG tablet   Oral   Take 15 mg by mouth daily.         Marland Kitchen pyridostigmine (MESTINON) 60 MG tablet   Oral   Take 60 mg by mouth 5 (five) times daily. Medication starts to wear off closer to 3 hours         . spironolactone (ALDACTONE) 25 MG tablet   Oral   Take 25 mg by mouth daily.         Marland Kitchen torsemide (DEMADEX) 20 MG tablet   Oral   Take 4 tablets (80 mg total) by mouth 2 (two) times daily.   240 tablet   3   . zolpidem (AMBIEN) 10 MG tablet   Oral   Take 5 mg by mouth at bedtime.           BP 157/88  Pulse 83  Temp(Src) 97.8 F (36.6 C) (Oral)  Resp 18  SpO2 100%  Physical Exam  Constitutional: He is oriented to person, place, and time. He appears well-developed and well-nourished. No distress.  HENT:  Head: Normocephalic and atraumatic.  Right Ear: Hearing normal.  Nose: Nose normal.  Mouth/Throat: Oropharynx is clear and moist and mucous membranes are normal.  Eyes: Conjunctivae and EOM are normal. Pupils are equal, round, and reactive to light.  Neck: Normal range of motion. Neck supple.  Cardiovascular: Normal rate, regular rhythm, S1 normal and S2 normal.  Exam reveals no gallop and no friction rub.   No murmur  heard. Pulmonary/Chest: Effort normal and breath sounds normal. No respiratory distress. He exhibits no tenderness.  Abdominal: Soft. Normal appearance and bowel sounds are  normal. There is no hepatosplenomegaly. There is no tenderness. There is no rebound, no guarding, no tenderness at McBurney's point and negative Murphy's sign. No hernia.  Musculoskeletal: Normal range of motion. He exhibits edema.  Neurological: He is alert and oriented to person, place, and time. He has normal strength. No cranial nerve deficit or sensory deficit. Coordination normal. GCS eye subscore is 4. GCS verbal subscore is 5. GCS motor subscore is 6.  Skin: Skin is warm, dry and intact. No rash noted. No cyanosis.  Psychiatric: He has a normal mood and affect. His speech is normal and behavior is normal. Thought content normal.    ED Course  Procedures (including critical care time)  Labs Reviewed  CBC WITH DIFFERENTIAL - Abnormal; Notable for the following:    RBC 4.08 (*)    Hemoglobin 11.3 (*)    HCT 30.8 (*)    MCV 75.5 (*)    MCHC 36.7 (*)    Eosinophils Relative 9 (*)    Eosinophils Absolute 0.8 (*)    All other components within normal limits  POCT I-STAT TROPONIN I - Abnormal; Notable for the following:    Troponin i, poc 0.13 (*)    All other components within normal limits  BASIC METABOLIC PANEL  PRO B NATRIURETIC PEPTIDE  URINALYSIS, ROUTINE W REFLEX MICROSCOPIC  TROPONIN I   Dg Chest 2 View  09/03/2012  *RADIOLOGY REPORT*  Clinical Data: Bilateral lower extremity swelling and shortness of breath.  CHEST - 2 VIEW  Comparison: 08/28/2012  Findings: Two views of the chest demonstrate increased densities at the right lung base and cannot exclude a small right pleural effusion.  Median sternotomy wires are present. Heart size is normal.  Trachea is midline.  IMPRESSION: Hazy densities at the right lung base could represent a focus of infection, atelectasis or infarct (if there is concern for  thromboembolic disease).  Probable small right pleural effusion.   Original Report Authenticated By: Richarda Overlie, M.D.      Diagnosis: 1. Hyponatremia 2. Congestive heart failure 3. Dizziness 4. Myasthenia gravis    MDM  She presented to the ER stating that he has been short of breath and dizzy. Symptoms have been present for 3 days. Workup was initiated. Patient is complaining of increased swelling of the lower extremities and he has a history of congestive heart failure. His lungs are clear to auscultation. His natruretic peptide appears to be around its normal baseline. I therefore cannot explain the patient's shortness of breath on congestive heart failure. This is concerning. Patient has a history of myasthenia gravis, potentially could be experiencing more shortness of breath because his Mestinon dosing is incorrect. Additionally, chest x-ray showed haziness at the right base that could be an infarct or pneumonia.  This is very difficult to deal with. He is very demanding and will not listen to recommendations. Patient has decided that he wants to go home. I did have a lengthy conversation with him on multiple occasions. He was informed that his shortness of breath is due to his myasthenia gravis, he could die from respiratory failure if he leaves the hospital. Likewise if he has a PE, this could time as well. He understands this and refuses admission. Additionally, patient profoundly hyponatremic. This is likely causing his dizziness. I did explain this to him, but he refuses to stay for treatment of this as well.  The attempt to perform a VQ scan on the patient. When he was called to go for the  procedure, however, he then refused an IV and refused the procedure.  Has already indicated, I did speak with him on multiple occasions about admission. I did have internal medicine teaching service come and talk with him as well. Either today or nor I could convince the patient to stay in the hospital.  He was told multiple times that he needed admission and could diabetes home but decided to leave AGAINST MEDICAL ADVICE.        Gilda Crease, MD 09/04/12 [phone]

## 2012-09-03 NOTE — ED Notes (Signed)
Pt states he will agree to be admitted and to have the vq scan done.  Pt up walking around will not sit down on bed at this time. Md aware.

## 2012-09-03 NOTE — ED Notes (Signed)
Pt reports increased SOB over the past 3 days. States he is retaining fluid. Reports decreased urine out after having his medication changed. States he has an appt next Friday. Swelling noted to bilateral lower extremities.

## 2012-09-03 NOTE — ED Notes (Signed)
Pt is still in x-ray but will come back to room when done.

## 2012-09-03 NOTE — ED Notes (Signed)
Pt has been ordered vq scan.  Pt states he does not want iv to be placed.  Daytime rn spoke with radiologist regarding the same.  md at bedside now.

## 2012-09-03 NOTE — ED Notes (Signed)
Internal med residents at bedside to speak with the pt.

## 2012-09-03 NOTE — ED Notes (Addendum)
Pt refusing to get into gown or be placed on monitor. States only here for sodium, potassium, renal function tests. Denies cp at this time states sob when lying. Pt will not let RN start IV at this time

## 2012-09-03 NOTE — ED Notes (Signed)
Pt now states he is unsure if he will agree to have the scan down.  Discussed risk with the pt regarding not getting the scan done.

## 2012-09-04 ENCOUNTER — Telehealth: Payer: Self-pay | Admitting: Internal Medicine

## 2012-09-04 NOTE — Telephone Encounter (Signed)
Called re abn cxr obtained for eval of sob he attributed to missing a mestinon and extra fluid buildup in legs.   cxr suggests R effusion/ atx  We have not seen him since 04/2011 but says he's feeling better back on mestinon and lasix, in fact no symptoms at all  rec f/u cxr this week with Dr Delford Field or me or Tammy NP

## 2012-09-05 ENCOUNTER — Telehealth: Payer: Self-pay | Admitting: *Deleted

## 2012-09-05 ENCOUNTER — Ambulatory Visit (INDEPENDENT_AMBULATORY_CARE_PROVIDER_SITE_OTHER): Payer: Medicare Other | Admitting: Adult Health

## 2012-09-05 ENCOUNTER — Telehealth: Payer: Self-pay | Admitting: Critical Care Medicine

## 2012-09-05 ENCOUNTER — Ambulatory Visit (INDEPENDENT_AMBULATORY_CARE_PROVIDER_SITE_OTHER)
Admission: RE | Admit: 2012-09-05 | Discharge: 2012-09-05 | Disposition: A | Discharge status: Home / Self Care | Payer: Medicare Other | Source: Ambulatory Visit | Attending: Adult Health | Admitting: Adult Health

## 2012-09-05 ENCOUNTER — Encounter: Payer: Self-pay | Admitting: Adult Health

## 2012-09-05 ENCOUNTER — Encounter (HOSPITAL_COMMUNITY): Payer: Self-pay | Admitting: *Deleted

## 2012-09-05 ENCOUNTER — Inpatient Hospital Stay (HOSPITAL_COMMUNITY)
Admission: EM | Admit: 2012-09-05 | Discharge: 2012-09-07 | DRG: 292 | Disposition: A | Discharge status: Home / Self Care | Payer: Medicare Other | Attending: Internal Medicine | Admitting: Internal Medicine

## 2012-09-05 VITALS — BP 134/82 | HR 76 | Temp 97.3°F | Ht 67.0 in | Wt 193.8 lb

## 2012-09-05 DIAGNOSIS — Z79899 Other long term (current) drug therapy: Secondary | ICD-10-CM

## 2012-09-05 DIAGNOSIS — N183 Chronic kidney disease, stage 3 unspecified: Secondary | ICD-10-CM | POA: Diagnosis present

## 2012-09-05 DIAGNOSIS — E1165 Type 2 diabetes mellitus with hyperglycemia: Secondary | ICD-10-CM | POA: Diagnosis present

## 2012-09-05 DIAGNOSIS — N058 Unspecified nephritic syndrome with other morphologic changes: Secondary | ICD-10-CM | POA: Diagnosis present

## 2012-09-05 DIAGNOSIS — R0602 Shortness of breath: Secondary | ICD-10-CM

## 2012-09-05 DIAGNOSIS — E871 Hypo-osmolality and hyponatremia: Secondary | ICD-10-CM | POA: Diagnosis present

## 2012-09-05 DIAGNOSIS — IMO0002 Reserved for concepts with insufficient information to code with codable children: Secondary | ICD-10-CM

## 2012-09-05 DIAGNOSIS — I509 Heart failure, unspecified: Secondary | ICD-10-CM

## 2012-09-05 DIAGNOSIS — I5033 Acute on chronic diastolic (congestive) heart failure: Secondary | ICD-10-CM

## 2012-09-05 DIAGNOSIS — F3289 Other specified depressive episodes: Secondary | ICD-10-CM | POA: Diagnosis present

## 2012-09-05 DIAGNOSIS — E785 Hyperlipidemia, unspecified: Secondary | ICD-10-CM | POA: Diagnosis present

## 2012-09-05 DIAGNOSIS — G4733 Obstructive sleep apnea (adult) (pediatric): Secondary | ICD-10-CM | POA: Diagnosis present

## 2012-09-05 DIAGNOSIS — D509 Iron deficiency anemia, unspecified: Secondary | ICD-10-CM | POA: Diagnosis present

## 2012-09-05 DIAGNOSIS — J4489 Other specified chronic obstructive pulmonary disease: Secondary | ICD-10-CM | POA: Diagnosis present

## 2012-09-05 DIAGNOSIS — E1122 Type 2 diabetes mellitus with diabetic chronic kidney disease: Secondary | ICD-10-CM

## 2012-09-05 DIAGNOSIS — G7000 Myasthenia gravis without (acute) exacerbation: Secondary | ICD-10-CM | POA: Diagnosis present

## 2012-09-05 DIAGNOSIS — I2510 Atherosclerotic heart disease of native coronary artery without angina pectoris: Secondary | ICD-10-CM | POA: Diagnosis present

## 2012-09-05 DIAGNOSIS — J90 Pleural effusion, not elsewhere classified: Secondary | ICD-10-CM

## 2012-09-05 DIAGNOSIS — I129 Hypertensive chronic kidney disease with stage 1 through stage 4 chronic kidney disease, or unspecified chronic kidney disease: Secondary | ICD-10-CM | POA: Diagnosis present

## 2012-09-05 DIAGNOSIS — N179 Acute kidney failure, unspecified: Secondary | ICD-10-CM | POA: Diagnosis present

## 2012-09-05 DIAGNOSIS — M47812 Spondylosis without myelopathy or radiculopathy, cervical region: Secondary | ICD-10-CM | POA: Diagnosis present

## 2012-09-05 DIAGNOSIS — I10 Essential (primary) hypertension: Secondary | ICD-10-CM

## 2012-09-05 DIAGNOSIS — F329 Major depressive disorder, single episode, unspecified: Secondary | ICD-10-CM | POA: Diagnosis present

## 2012-09-05 DIAGNOSIS — R609 Edema, unspecified: Secondary | ICD-10-CM

## 2012-09-05 DIAGNOSIS — I4510 Unspecified right bundle-branch block: Secondary | ICD-10-CM | POA: Diagnosis present

## 2012-09-05 DIAGNOSIS — I5023 Acute on chronic systolic (congestive) heart failure: Principal | ICD-10-CM | POA: Diagnosis present

## 2012-09-05 DIAGNOSIS — E1129 Type 2 diabetes mellitus with other diabetic kidney complication: Secondary | ICD-10-CM | POA: Diagnosis present

## 2012-09-05 DIAGNOSIS — J449 Chronic obstructive pulmonary disease, unspecified: Secondary | ICD-10-CM | POA: Diagnosis present

## 2012-09-05 DIAGNOSIS — F988 Other specified behavioral and emotional disorders with onset usually occurring in childhood and adolescence: Secondary | ICD-10-CM | POA: Diagnosis present

## 2012-09-05 DIAGNOSIS — I5042 Chronic combined systolic (congestive) and diastolic (congestive) heart failure: Secondary | ICD-10-CM

## 2012-09-05 DIAGNOSIS — I5043 Acute on chronic combined systolic (congestive) and diastolic (congestive) heart failure: Secondary | ICD-10-CM

## 2012-09-05 DIAGNOSIS — I5032 Chronic diastolic (congestive) heart failure: Secondary | ICD-10-CM | POA: Diagnosis present

## 2012-09-05 DIAGNOSIS — Z87891 Personal history of nicotine dependence: Secondary | ICD-10-CM

## 2012-09-05 LAB — PROTIME-INR: INR: 0.86 (ref 0.00–1.49)

## 2012-09-05 LAB — CBC
HCT: 31.6 % — ABNORMAL LOW (ref 39.0–52.0)
MCHC: 36.7 g/dL — ABNORMAL HIGH (ref 30.0–36.0)
RDW: 12.5 % (ref 11.5–15.5)

## 2012-09-05 LAB — COMPREHENSIVE METABOLIC PANEL
ALT: 8 U/L (ref 0–53)
Calcium: 9.8 mg/dL (ref 8.4–10.5)
GFR calc Af Amer: 61 mL/min — ABNORMAL LOW (ref >90)
Glucose, Bld: 337 mg/dL — ABNORMAL HIGH (ref 70–99)
Sodium: 121 mEq/L — ABNORMAL LOW (ref 135–145)
Total Protein: 6.8 g/dL (ref 6.0–8.3)

## 2012-09-05 LAB — POCT I-STAT, CHEM 8
BUN: 68 mg/dL — ABNORMAL HIGH (ref 6–23)
HCT: 35 % — ABNORMAL LOW (ref 39.0–52.0)
Hemoglobin: 11.9 g/dL — ABNORMAL LOW (ref 13.0–17.0)
Sodium: 122 mEq/L — ABNORMAL LOW (ref 135–145)
TCO2: 29 mmol/L (ref 0–100)

## 2012-09-05 LAB — POCT I-STAT TROPONIN I: Troponin i, poc: 0.09 ng/mL (ref 0.00–0.08)

## 2012-09-05 LAB — PRO B NATRIURETIC PEPTIDE: Pro B Natriuretic peptide (BNP): 2519 pg/mL — ABNORMAL HIGH (ref 0–125)

## 2012-09-05 LAB — GLUCOSE, CAPILLARY: Glucose-Capillary: 300 mg/dL — ABNORMAL HIGH (ref 70–99)

## 2012-09-05 LAB — APTT: aPTT: 30 seconds (ref 24–37)

## 2012-09-05 MED ORDER — ONDANSETRON HCL 4 MG PO TABS: 4.0000 mg | ORAL_TABLET | Freq: Four times a day (QID) | ORAL | Status: DC | PRN

## 2012-09-05 MED ORDER — ZOLPIDEM TARTRATE 5 MG PO TABS
5.0000 mg | ORAL_TABLET | Freq: Every day | ORAL | Status: DC
  Administered 2012-09-05 – 2012-09-06 (×2): 5 mg via ORAL
  Filled 2012-09-05 (×2): qty 1

## 2012-09-05 MED ORDER — MYCOPHENOLATE MOFETIL 500 MG PO TABS
500.0000 mg | ORAL_TABLET | Freq: Two times a day (BID) | ORAL | Status: DC
  Filled 2012-09-05: qty 1

## 2012-09-05 MED ORDER — ONDANSETRON HCL 4 MG/2ML IJ SOLN: 4.0000 mg | Freq: Four times a day (QID) | INTRAMUSCULAR | Status: DC | PRN

## 2012-09-05 MED ORDER — MAGIC MOUTHWASH W/LIDOCAINE
10.0000 mL | Freq: Four times a day (QID) | ORAL | Status: DC | PRN
  Filled 2012-09-05: qty 10

## 2012-09-05 MED ORDER — SODIUM CHLORIDE 0.9 % IJ SOLN: 3.0000 mL | INTRAMUSCULAR | Status: DC | PRN

## 2012-09-05 MED ORDER — OXYCODONE HCL 5 MG PO TABS
5.0000 mg | ORAL_TABLET | Freq: Once | ORAL | Status: AC
  Administered 2012-09-05: 5 mg via ORAL
  Filled 2012-09-05: qty 1

## 2012-09-05 MED ORDER — ACETAMINOPHEN 325 MG PO TABS
650.0000 mg | ORAL_TABLET | Freq: Four times a day (QID) | ORAL | Status: DC | PRN
  Administered 2012-09-06 (×3): 650 mg via ORAL
  Filled 2012-09-05 (×4): qty 2

## 2012-09-05 MED ORDER — ACETAMINOPHEN 650 MG RE SUPP: 650.0000 mg | Freq: Four times a day (QID) | RECTAL | Status: DC | PRN

## 2012-09-05 MED ORDER — SODIUM CHLORIDE 0.9 % IJ SOLN
3.0000 mL | Freq: Two times a day (BID) | INTRAMUSCULAR | Status: DC
  Administered 2012-09-05 – 2012-09-06 (×3): 3 mL via INTRAVENOUS

## 2012-09-05 MED ORDER — SODIUM CHLORIDE 0.9 % IJ SOLN
3.0000 mL | Freq: Two times a day (BID) | INTRAMUSCULAR | Status: DC
  Administered 2012-09-05: 3 mL via INTRAVENOUS

## 2012-09-05 MED ORDER — FUROSEMIDE 10 MG/ML IJ SOLN
40.0000 mg | INTRAMUSCULAR | Status: AC
  Administered 2012-09-05: 40 mg via INTRAVENOUS
  Filled 2012-09-05: qty 4

## 2012-09-05 MED ORDER — HEPARIN SODIUM (PORCINE) 5000 UNIT/ML IJ SOLN
5000.0000 [IU] | Freq: Three times a day (TID) | INTRAMUSCULAR | Status: DC
  Administered 2012-09-05 – 2012-09-07 (×5): 5000 [IU] via SUBCUTANEOUS
  Filled 2012-09-05 (×8): qty 1

## 2012-09-05 MED ORDER — INSULIN ASPART 100 UNIT/ML ~~LOC~~ SOLN: 0.0000 [IU] | SUBCUTANEOUS | Status: DC

## 2012-09-05 MED ORDER — PYRIDOSTIGMINE BROMIDE 60 MG PO TABS
60.0000 mg | ORAL_TABLET | ORAL | Status: DC | PRN
  Administered 2012-09-06 – 2012-09-07 (×5): 60 mg via ORAL
  Filled 2012-09-05 (×7): qty 1

## 2012-09-05 MED ORDER — MIRTAZAPINE 30 MG PO TABS
30.0000 mg | ORAL_TABLET | Freq: Every day | ORAL | Status: DC
  Administered 2012-09-05 – 2012-09-06 (×2): 30 mg via ORAL
  Filled 2012-09-05 (×3): qty 1

## 2012-09-05 MED ORDER — ONDANSETRON HCL 4 MG/2ML IJ SOLN: 4.0000 mg | Freq: Three times a day (TID) | INTRAMUSCULAR | Status: AC | PRN

## 2012-09-05 MED ORDER — SODIUM CHLORIDE 0.9 % IV SOLN: 250.0000 mL | INTRAVENOUS | Status: DC | PRN

## 2012-09-05 MED ORDER — PREDNISONE 5 MG PO TABS
15.0000 mg | ORAL_TABLET | Freq: Every day | ORAL | Status: DC
  Filled 2012-09-05 (×3): qty 1

## 2012-09-05 MED ORDER — MYCOPHENOLATE MOFETIL 250 MG PO CAPS
500.0000 mg | ORAL_CAPSULE | Freq: Two times a day (BID) | ORAL | Status: DC
  Administered 2012-09-05 – 2012-09-07 (×3): 500 mg via ORAL
  Filled 2012-09-05 (×5): qty 2

## 2012-09-05 MED ORDER — ASPIRIN 81 MG PO CHEW
324.0000 mg | CHEWABLE_TABLET | Freq: Once | ORAL | Status: AC
  Administered 2012-09-05: 324 mg via ORAL
  Filled 2012-09-05: qty 4

## 2012-09-05 MED ORDER — ALUM & MAG HYDROXIDE-SIMETH 200-200-20 MG/5ML PO SUSP
30.0000 mL | Freq: Four times a day (QID) | ORAL | Status: DC | PRN
  Filled 2012-09-05: qty 30

## 2012-09-05 NOTE — Telephone Encounter (Signed)
Pt calls for the 3rd time today, states he does not remember talking w/ triage this am but message was given to dr Tonny Branch, he has been since to dr werts office and seen NP, he was encouraged then to agree to be admitted but declined once again, he ask what triage nurse thinks and is told that he should take the advice of his physicians and it is not up to the triage nurse. He states he will come to the ED as advised by his md's and be admitted

## 2012-09-05 NOTE — Telephone Encounter (Signed)
Pt scheduled to see TP today. Carron Curie, CMA

## 2012-09-05 NOTE — ED Notes (Signed)
Pt was here Sat and told he may have a pnx or "blood clot" to R lower lung.  Pt left before VQ scan, thinking he would follow up with pulmonologist.  Pulmonologist told him to return to ED.  3+ edema to BIL lower extremiites.

## 2012-09-05 NOTE — Telephone Encounter (Signed)
Pt scheduled to see TP today at 11:30 with STAT CXR prior. Carron Curie, CMA

## 2012-09-05 NOTE — Telephone Encounter (Signed)
I spoke with [name] at [facility] and from what it sounds like he definitely needs to be admitted and diuresed.  I agree with encouraging him to come to the ED today for evaluation and admission.

## 2012-09-05 NOTE — Assessment & Plan Note (Signed)
Worsening renal insufficiency with electrolyte imbalances.  Plan  Recommend admission , suspect will need to change metformin and consider d/c ACE  May need renal US .

## 2012-09-05 NOTE — ED Provider Notes (Signed)
History     CSN: [id number]  Arrival date & time 09/05/12  4098   First MD Initiated Contact with Patient 09/05/12 1848      Chief Complaint  Patient presents with  . Shortness of Breath    (Consider location/radiation/quality/duration/timing/severity/associated sxs/prior treatment) HPI Comments: 67 year old male with a history of myasthenia gravis, congestive heart failure who has had a recent evaluation in the emergency department at which time he left AGAINST MEDICAL ADVICE where he was diagnosed with significant hyponatremia and a right lower lobe opacity which is currently unexplained as to the source of either infection or pulmonary embolism or other source. The patient refused evaluation at that time. He presents today coming from the offices of pulmonology, Dr. Delford Field where he saw the nurse practitioner and underwent a chest x-ray confirming a persistent right lower lobe opacity. He was also told that his renal function had decreased and that his increased swelling would require further evaluation and likely admission to the hospital. The patient endorses increased shortness of breath which is intermittent, does not appear to be positional or exertional but gets worse as his lower extremity edema worsens. He denies fevers chills or coughing. He has recently switched from furosemide 2 torsemide and has felt like this is not done as well diureses.  Patient is a 67 y.o. male presenting with shortness of breath. The history is provided by the patient and medical records.  Shortness of Breath   Past Medical History  Diagnosis Date  . Sinus tachycardia 2006    a. Nonobstructive CAD by cath 2013. b. s/p atrial tachycardia ablation 08/2011 (sinus node modification)  . Orthostatic hypotension   . Unspecified essential hypertension   . Hyperlipidemia   . Cervical spondylosis with radiculopathy   . Syrinx     T5-T6  . Cord compression     Cord compression syndrome C5-C6, C6-C7  . ADD  (attention deficit disorder)   . Decreased libido   . Depression   . Diabetes mellitus type 2, uncontrolled, with complications     CAD, orthostatic hypotension, erectile dysfunction  . Pulmonary nodule, left     Evaluated 04/2011 by pulmonology - felt to be benign granuloma  . Peripheral edema   . Coronary artery disease, non-occlusive     Mild nonobstructive CAD by cath 08/2011  . Systolic congestive heart failure     By echo 2013 at [facility] - reported EF 45% per pt  . Colitis     with surface exudate  . Hemorrhoids     small  . Gastritis   . Clostridium difficile infection 02/15/2012    causing pseudomembranosu colitis  . Diastolic heart failure     grade 2 per echocardiogram (2011)  . Right bundle branch block and left posterior fascicular block   . Sleep apnea     occassionally uses cpap  . Myasthenia gravis     Status post thymectomy April 2012, hx of plasmapheresis    Past Surgical History  Procedure Laterality Date  . Thymectomy  april 2012  . Colonoscopy  11/13/2011    Procedure: COLONOSCOPY;  Surgeon: Theda Belfast, MD;  Location: [facility];  Service: Endoscopy;  Laterality: N/A;  . Cardiac catheterization    . Esophagogastroduodenoscopy  05/13/2012    Procedure: ESOPHAGOGASTRODUODENOSCOPY (EGD);  Surgeon: Iva Boop, MD;  Location: [facility];  Service: Endoscopy;  Laterality: N/A;  needs PAT w/ dx of Myastenia gravis and multiple meds. Pt requested lidocaine to numb IV site prior to  start.  Very needle phobic.    Family History  Problem Relation Age of Onset  . Breast cancer Mother   . Other Father     Beck's Sarcoid  . Colon cancer Neg Hx   . Prostate cancer Neg Hx     History  Substance Use Topics  . Smoking status: Former Smoker -- 2.50 packs/day for 30 years    Quit date: 06/09/1991  . Smokeless tobacco: Never Used  . Alcohol Use: No      Review of Systems  Respiratory: Positive for shortness of breath.   All other systems reviewed and are  negative.    Allergies  Aminoglycosides; Beta adrenergic blockers; Calcium channel blockers; Metoprolol; Neuromuscular blocking agents; Other; Penicillins; and Quinine derivatives  Home Medications   Current Outpatient Rx  Name  Route  Sig  Dispense  Refill  . Alum & Mag Hydroxide-Simeth (MAGIC MOUTHWASH W/LIDOCAINE) SOLN   Oral   Take 10 mLs by mouth 4 (four) times daily as needed (pain). Swish and spit.   250 mL   0   . benazepril (LOTENSIN) 40 MG tablet   Oral   Take 40 mg by mouth daily before breakfast.         . metFORMIN (GLUCOPHAGE) 500 MG tablet   Oral   Take 1 tablet (500 mg total) by mouth 2 (two) times daily with a meal.   60 tablet   0   . metolazone (ZAROXOLYN) 5 MG tablet   Oral   Take 5 mg by mouth daily.         . mirtazapine (REMERON) 30 MG tablet   Oral   Take 1 tablet (30 mg total) by mouth at bedtime.   30 tablet   1   . mycophenolate (CELLCEPT) 500 MG tablet   Oral   Take 500 mg by mouth 2 (two) times daily.         Marland Kitchen oxyCODONE (OXY IR/ROXICODONE) 5 MG immediate release tablet   Oral   Take 1 tablet (5 mg total) by mouth every 4 (four) hours as needed. For pain   120 tablet   0   . pantoprazole (PROTONIX) 40 MG tablet   Oral   Take 1 tablet (40 mg total) by mouth daily.   30 tablet   6   . predniSONE (DELTASONE) 5 MG tablet   Oral   Take 15 mg by mouth daily.         Marland Kitchen pyridostigmine (MESTINON) 60 MG tablet   Oral   Take 60 mg by mouth 5 (five) times daily. Medication starts to wear off closer to 2.5 hours         . torsemide (DEMADEX) 20 MG tablet   Oral   Take 80 mg by mouth 2 (two) times daily.         Marland Kitchen zolpidem (AMBIEN) 10 MG tablet   Oral   Take 5 mg by mouth at bedtime.           BP 127/61  Pulse 81  Temp(Src) 97.8 F (36.6 C) (Oral)  Resp 17  Ht 5\' 7"  (1.702 m)  Wt 187 lb (84.823 kg)  BMI 29.28 kg/m2  SpO2 100%  Physical Exam  Nursing note and vitals reviewed. Constitutional: He appears  well-developed and well-nourished. No distress.  HENT:  Head: Normocephalic and atraumatic.  Mouth/Throat: Oropharynx is clear and moist. No oropharyngeal exudate.  Eyes: Conjunctivae and EOM are normal. Pupils are equal, round, and reactive to  light. Right eye exhibits no discharge. Left eye exhibits no discharge. No scleral icterus.  Neck: Normal range of motion. Neck supple. No JVD present. No thyromegaly present.  Cardiovascular: Normal rate, regular rhythm, normal heart sounds and intact distal pulses.  Exam reveals no gallop and no friction rub.   No murmur heard. Pulmonary/Chest: Effort normal and breath sounds normal. No respiratory distress. He has no wheezes. He has no rales.  Abdominal: Soft. Bowel sounds are normal. He exhibits no distension and no mass. There is no tenderness.  Musculoskeletal: Normal range of motion. He exhibits edema ( Bilateral lower extremities pitting symmetrical edema). He exhibits no tenderness.  Lymphadenopathy:    He has no cervical adenopathy.  Neurological: He is alert. Coordination normal.  Skin: Skin is warm and dry. No rash noted. No erythema.  Psychiatric: He has a normal mood and affect. His behavior is normal.    ED Course  Procedures (including critical care time)  Labs Reviewed  CBC - Abnormal; Notable for the following:    WBC 11.2 (*)    Hemoglobin 11.6 (*)    HCT 31.6 (*)    MCV 74.9 (*)    MCHC 36.7 (*)    All other components within normal limits  PRO B NATRIURETIC PEPTIDE - Abnormal; Notable for the following:    Pro B Natriuretic peptide (BNP) 2519.0 (*)    All other components within normal limits  COMPREHENSIVE METABOLIC PANEL - Abnormal; Notable for the following:    Sodium 121 (*)    Chloride 82 (*)    Glucose, Bld 337 (*)    BUN 73 (*)    Alkaline Phosphatase 150 (*)    GFR calc non Af Amer 52 (*)    GFR calc Af Amer 61 (*)    All other components within normal limits  POCT I-STAT, CHEM 8 - Abnormal; Notable for the  following:    Sodium 122 (*)    Chloride 89 (*)    BUN 68 (*)    Creatinine, Ser 1.70 (*)    Glucose, Bld 335 (*)    Hemoglobin 11.9 (*)    HCT 35.0 (*)    All other components within normal limits  POCT I-STAT TROPONIN I - Abnormal; Notable for the following:    Troponin i, poc 0.09 (*)    All other components within normal limits  APTT  PROTIME-INR   Dg Chest 2 View  09/05/2012  *RADIOLOGY REPORT*  Clinical Data: Shortness of breath.  CHEST - 2 VIEW  Comparison: 09/03/2012.  Findings: Trachea is midline.  Heart size normal.  Mild bibasilar air space disease and small right pleural effusion persist.  Tiny left pleural effusion.  IMPRESSION: Tiny bilateral pleural effusions with mild right basilar airspace disease.   Original Report Authenticated By: Leanna Battles, M.D.      1. Peripheral edema   2.  Hyponatremia    MDM  The patient does not have respiratory distress, he does not have hypoxia, fever, tachycardia or hypotension. He is mildly tachypneic but on my clinical exam his respiratory rate seems to be around 20 breaths per minute. His EKG shows a normal sinus rhythm with no significant abnormal findings but his clinical exam is concerning for increased peripheral edema. Review of his prior ER visit show that he was hyponatremic with a sodium of 123. He will need repeat labs today, IV access, diuresis and admission to the hospital for his hyponatremia, further evaluation of possible pulmonary embolism. The patient  does not appear toxic.  ED ECG REPORT  I personally interpreted this EKG   Date: 09/05/2012   Rate: 87  Rhythm: normal sinus rhythm  QRS Axis: normal  Intervals: normal  ST/T Wave abnormalities: nonspecific ST/T changes  Conduction Disutrbances:none  Narrative Interpretation:   Old EKG Reviewed: Compared with 10/10/2011, no significant changes    Labs show elevated BNP, renal insufficiency - also noted to be hyponatremic, will be admitted to the internal  medicine service, Lasix given in emergency department. Patient is not tachycardic and is not have any dyspnea and does not appear in any distress     Vida Roller, MD 09/05/12 2112

## 2012-09-05 NOTE — H&P (Signed)
Hospital Admission Note Date: 09/05/2012  Patient name: [name] Medical record number: [id number] Date of birth: 31-Jan-1946 Age: 67 y.o. Gender: male PCP: PRIBULA,CHRISTOPHER, MD  Medical Service: Internal Medicine Teaching Service   Attending physician:  Dr. Kem Kays    1st Contact: Dr. Earlene Plater   Pager: [phone] 2nd Contact: Dr. Manson Passey   Pager: [phone] After 5 pm or weekends: 1st Contact:      Pager: [phone] 2nd Contact:      Pager: [phone]  Chief Complaint: SOB  History of Present Illness: 67yo male with PMH myasthenia gravis, congestive heart failure (grade 2 diastolic dysfunction from ECHO 2011), and a recent evaluation in the emergency department on 3/29 at which time he left AGAINST MEDICAL ADVICE where he was diagnosed with significant hyponatremia and a right lower lobe opacity which was thought to be infection vs. pulmonary embolism, returns to the ED today from his Pulmonologist's office with continued SOB and increased BLE. Today he was seen at his Pulmonologists, Dr. Lynelle Doctor office by the nurse practitioner. A CXR was performed which showed a persistent right lower lobe opacity. His renal function was noted to have worsened as well, and he was told that his increased edema would require further evaluation in the ED and likely admission to the hospital.   The patient does endorse intermittent increased shortness of breath which is not positional or exertional but does worsen with increasing lower extremity edema. It is improved with standing. He denies fevers chills or cough. He was switched from furosemide to torsemide 60mg  po BID on 3/19, which was increased to 80mg  po BID on 3/26, but he feels like this has not improved his diureses.   He takes Mestinon for his Myasthenia Gravis, and states that if he does not get a dose exactly when he needs one, he begins "to panic" and cannot breathe. He states that he is going to keep his Mestinon on his person this hospitalization and no  one is going to take it from him or he will leave AMA.   He is also demanding his medications for sleep, which consist of mirtazepine, Ambien, and oxycodone 5mg  all taken prior to bed. Again, he is threatening to leave AMA if he does not get these medications and states thathe has been taking them for 5 weeks without any adverse side effects.  Meds: No current outpatient prescriptions on file.  Allergies: Allergies as of 09/05/2012 - Review Complete 09/05/2012  Allergen Reaction Noted  . Aminoglycosides  04/14/2011  . Beta adrenergic blockers Other (See Comments)   . Calcium channel blockers Other (See Comments)   . Metoprolol Other (See Comments) 09/12/2011  . Neuromuscular blocking agents Other (See Comments) 04/14/2011  . Other Other (See Comments) 09/12/2011  . Penicillins Other (See Comments) 09/12/2011  . Quinine derivatives Other (See Comments) 09/12/2011   Past Medical History  Diagnosis Date  . Sinus tachycardia 2006    a. Nonobstructive CAD by cath 2013. b. s/p atrial tachycardia ablation 08/2011 (sinus node modification)  . Orthostatic hypotension   . Unspecified essential hypertension   . Hyperlipidemia   . Cervical spondylosis with radiculopathy   . Syrinx     T5-T6  . Cord compression     Cord compression syndrome C5-C6, C6-C7  . ADD (attention deficit disorder)   . Decreased libido   . Depression   . Diabetes mellitus type 2, uncontrolled, with complications     CAD, orthostatic hypotension, erectile dysfunction  . Pulmonary nodule, left  Evaluated 04/2011 by pulmonology - felt to be benign granuloma  . Peripheral edema   . Coronary artery disease, non-occlusive     Mild nonobstructive CAD by cath 08/2011  . Systolic congestive heart failure     By echo 2013 at [facility] - reported EF 45% per pt  . Colitis     with surface exudate  . Hemorrhoids     small  . Gastritis   . Clostridium difficile infection 02/15/2012    causing pseudomembranosu colitis  .  Diastolic heart failure     grade 2 per echocardiogram (2011)  . Right bundle branch block and left posterior fascicular block   . Sleep apnea     occassionally uses cpap  . 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 Thymectomy  april 2012  . Colonoscopy  11/13/2011    Procedure: COLONOSCOPY;  Surgeon: Theda Belfast, MD;  Location: [facility];  Service: Endoscopy;  Laterality: N/A;  . Cardiac catheterization    . Esophagogastroduodenoscopy  05/13/2012    Procedure: ESOPHAGOGASTRODUODENOSCOPY (EGD);  Surgeon: Iva Boop, MD;  Location: [facility];  Service: Endoscopy;  Laterality: N/A;  needs PAT w/ dx of Myastenia gravis and multiple meds. Pt requested lidocaine to numb IV site prior to start.  Very needle phobic.   Family History  Problem Relation Age of Onset  . Breast cancer Mother   . Other Father     Beck's Sarcoid  . Colon cancer Neg Hx   . Prostate cancer Neg Hx    History   Social History  . Marital Status: Single    Spouse Name: N/A    Number of Children: 1  . Years of Education: N/A   Occupational History  . Retired     Surveyor, minerals   Social History Main Topics  . Smoking status: Former Smoker -- 2.50 packs/day for 30 years    Quit date: 06/09/1991  . Smokeless tobacco: Never Used  . Alcohol Use: No  . Drug Use: No  . Sexually Active: Not on file   Other Topics Concern  . Not on file   Social History Narrative   Has a single man's lifestyle and will remind anyone of it when asked.   Prefers to see only male physicians   Retired Surveyor, minerals    Review of Systems: A 10 point ROS was performed; pertinent positives and negatives were noted in the HPI   Physical Exam: Blood pressure 140/115, pulse 97, temperature 97.8 F (36.6 C), temperature source Oral, resp. rate 20, height 5\' 7"  (1.702 m), weight 187 lb (84.823 kg), SpO2 97.00%. General: Alert,  well-developed, somewhat uncooperative on evaluation but cooperative on exam.  Head: Normocephalic and atraumatic.  Eyes: Pupils equal, round, and reactive to light, no injection and anicteric.  Mouth: Pharynx pink and moist, no erythema or exudates.  Neck: Supple, full ROM, + JVD  Lungs: CTAB, normal respiratory effort, no accessory muscle use, no crackles, and no wheezes. Heart: Irregularly, irregular rate and rhythm Abdomen: Soft, non-tender, non-distended.  Msk: No joint swelling, warmth, or erythema.  Extremities: 2+ radial pulses bilaterally. 3+ pitting edema of BLE up to knees. Neurologic: Alert & oriented X3, cranial nerves II-XII intact, non-focal Psych: Normal mood and affect, but difficult at times when he does not get his way.  Lab results: Basic Metabolic Panel:  Recent Labs  54/09/81 1655 09/05/12 1920 09/05/12 1941  NA 123* 121* 122*  K 4.6 4.7 4.7  CL 85* 82* 89*  CO2 21 26  --   GLUCOSE 283* 337* 335*  BUN 75* 73* 68*  CREATININE 1.63* 1.35 1.70*  CALCIUM 9.7 9.8  --    Liver Function Tests:  Recent Labs  09/05/12 1920  AST 15  ALT 8  ALKPHOS 150*  BILITOT 0.4  PROT 6.8  ALBUMIN 3.5   CBC:  Recent Labs  09/03/12 1655 09/05/12 1941 09/05/12 2021  WBC 9.2  --  11.2*  NEUTROABS 6.2  --   --   HGB 11.3* 11.9* 11.6*  HCT 30.8* 35.0* 31.6*  MCV 75.5*  --  74.9*  PLT 304  --  289   Cardiac Enzymes:  Recent Labs  09/03/12 1735  TROPONINI <0.30   BNP:  Recent Labs  09/03/12 1656 09/05/12 1920  PROBNP 2552.0* 2519.0*   Coagulation:  Recent Labs  09/05/12 1920  LABPROT 11.7  INR 0.86   Urinalysis:  Recent Labs  09/03/12 1730  COLORURINE YELLOW  LABSPEC 1.009  PHURINE 6.0  GLUCOSEU NEGATIVE  HGBUR SMALL*  BILIRUBINUR NEGATIVE  KETONESUR NEGATIVE  PROTEINUR 30*  UROBILINOGEN 0.2  NITRITE NEGATIVE  LEUKOCYTESUR NEGATIVE    Imaging results:  Dg Chest 2 View  09/05/2012  *RADIOLOGY REPORT*  Clinical Data: Shortness of  breath.  CHEST - 2 VIEW  Comparison: 09/03/2012.  Findings: Trachea is midline.  Heart size normal.  Mild bibasilar air space disease and small right pleural effusion persist.  Tiny left pleural effusion.  IMPRESSION: Tiny bilateral pleural effusions with mild right basilar airspace disease.   Original Report Authenticated By: Leanna Battles, M.D.     Other results: EKG: RBBB, diffuse nonspecific ST changes. Unchanged from previous EKG.  Assessment & Plan by Problem: Acute on chronic CHF exacerbation: Pt with h/o CHF, but last ECHO was in 2011 which showed normal systolic function and EF but grade 2 diastolic dysfunction. During his previous ED admission, there were concerns for pneumonia vs PE; however the patient left AMA before a V/Q scan could be obtained. He was seen in his pulmonologists office today where a repeat CXR was performed and showed small bilateral pleural effusions but made no mention of concern for possible thromboembolus.  He is saturating 98% on room air but is tachypnic to 26. He does have a mild leukocytosis but is afebrile, so cannot r/o infectious process at this time or pulmonary emboli, but PE does seem less likely. Wells score of 3 puts him in the moderate risk category with a 16.2% chance for PE; Modified Geneva Score also puts him at moderate risk with 20-30% chance for PE. He does have a h/o of medication noncompliance and a recurrent h/o significant edema contributed to his CHF. He is currently supposed to be taking Metolazone 5mg  po daily, Torsemide 80mg  po BID, and Benazepril 40mg  po daily. ProBNP is 2519, which is actually slightly lower than previous values. iSTAT troponin 0.09; previously elevated, 0.13 on 3/29, likely 2/2 to demand ischemia. 40IV Lasix was given in the ED with good diuresis. Holding home medications on admission. - Admit to IMTS to telemetry - Daily weights - Strict I&Os - 2D ECHO - Troponin q6h x3 - Heart Healthy diet - KVO  Hyponatremia:   Sodium is 122 in the ED. On 3.26.14 it was 133. This is likely 2/2 to his CHF exacerbation, but will work up to r/o other causes.  - Urine osmols - Serum osmols -  Urine sodium - AM BMP  AKI: Cr on 3/29 was 1.63. Today it has improved to 1.35. Holding home medications of benazepril and Metformin 2/2 AKI. Possibly 2/2 Cardio-Renal Syndrome.  - AM BMP - Renal U/S  Chronic microcytic anemia: Hgb 11.6 in the ED. Over the past few years, his hemoglobin has been fluctuating between 12-14 with microcytosis noted since 05/2012. No active bleeding. - Anemia panel - AM CBC  Diabetes Mellitus, Type II: Pt is currently on Metformin, but blood glucose in the ED is >300. Will hold Metformin 2/2 AKI. - SSI -  CBGs ACHS Hypertension:  Stable. Pt is currently on benazepril in addition to the torsemide. Blood pressure appears well controlled in the ED. Will hold the ACEi 2/2 to his improving AKI, and will start IV Lasix and hold the Toresmide as noted above.  Myasthenia Gravis: Currently taking pyridostigmine and Cellcept. He is taking Prednisone intermittently when he remembers.  Pt feels his symptoms are well controlled. As noted in the HPI, he refused to turn in his home pyridostigmine and continues to keep it on his person. He has 6 tablets, confirmed by Pharmacy. Pt to notify nursing if he takes his home medication. He is aware of the risks and acknowledges them. Will continue home medication - Pyridostigmine 60mg  po 5x daily - Cellcept 500mg  po BID - Prednisone 15mg  po daily  Difficulty Sleeping:  Pt states that he has difficulty sleeping and began taking a regimen consisting of -1 5mg  tablet of Ambien a night, Remeron 30mg , and oxycodone 5mg . He has been taking this combination for the past 5 weeks at home without any respiratory effects. He is demanding this regimen or states that he will leave AMA.  Patient aware of risks but demands these medications despite this. Will provide for the patient under  close supervision. - Remeron 30mg  po at bedtime - Oxycodone 5mg  po at bedtime - Ambien 5mg  po at beditime  DVT PPx: Heparin  Dispo: Disposition is deferred at this time, awaiting improvement of current medical problems. Anticipated discharge in approximately 1-3 day(s).   The patient does have a current PCP (PRIBULA,CHRISTOPHER, MD), therefore will be requiring OPC follow-up after discharge.   The patient does not have transportation limitations that hinder transportation to clinic appointments.  Signed: Genelle Gather 09/05/2012, 10:50 PM

## 2012-09-05 NOTE — Patient Instructions (Addendum)
We feel you need to be admitted to hospital immediately.  You have declined EMS transport .  If you change your mind please go directly to [facility] Emergency department.  Continue on Demadex as directly.  Follow up with your [facility] doctor as planned this week.  Follow up Dr. Delford Field  In 4 weeks and As needed     Late add: spoke with DR. Tonny Branch , notified of pt condition and agreed of admission needs Will contact pt and arrange acute OV at office if pt agrees

## 2012-09-05 NOTE — Progress Notes (Signed)
Subjective:    Patient ID: [name], male    DOB: 13-Oct-1945, 67 y.o.   MRN: [id number]  HPI   67 yo WM referred for abn CXR.       This is a  67 y.o.   male who presents with an abnormal radiology finding.  The patient complains of cough, but denies history of diagnosed COPD, shortness of breath, chest tightness, chest pain worse with breathing and coughing, wheezing, mucous production, nocturnal awakening, exercise induced symptoms, and congestion.    This pt has a hx of mild throat clearing and 30ys of smoking and quit smoking  1993.  Pt desired a CXR per PCP and was obtained .This revealed peribronchial thickening and  pt referred to pulm for eval. Pt denies any dyspnea or chest pain.  There is not any mucus or excess wheeze.  The pt has no specific pulm symptoms at this time.  Pt denies any significant sore throat, nasal congestion or excess secretions, fever, chills, sweats, unintended weight loss, pleurtic or exertional chest pain, orthopnea PND, or leg swelling Pt denies any increase in rescue therapy over baseline, denies waking up needing it or having any early am or nocturnal exacerbations of coughing/wheezing/or dyspnea. Pt also denies any obvious fluctuation in symptoms wiht weather or environmental change or other alleviating or aggravating factors  Spirometry obtained today and is normal.June 18, 2010 3:29 PM Pt now with myasthenia gravis.  Pt with non calc 5mm nodule. No other changes.  Pt denies any significant sore throat, nasal congestion or excess secretions, fever, chills, sweats, unintended weight loss, pleurtic or exertional chest pain, orthopnea PND, or leg swelling Pt denies any increase in rescue therapy over baseline, denies waking up needing it or having any early am or nocturnal exacerbations of coughing/wheezing/or dyspnea.  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  Also had plasmapheresis.  Had dysphagia. Had eye drooping.   The eye drooping and dysphagia is better.  Dyspnea is better.  Mestinon helped.    04/14/2011 S/p thymectomy  4/12.  Notes some chest pain at surgery site,  When ankles swelled, saw Cardiology, Rx lasix. Already on mestinon.  Pt then quit salt intake and ok since that time. No real mucus.  Drooping helped.  MG better.  Plasmapheresis also helped.  Prednisone up to 20mg /d and this helped the weakness >no changes     09/05/2012 Acute OV   [name] is a 67 year old gentleman with past medical history significant for CHF, OSA, DM 2, and Myasthenia Gravis who presents to clinic today for an acute OV.  He was last seen in 11/202. Has been having difficulty with recurrent CHF decompensation , renal failure with diuretic adjustment. He was seen in ER on 09/03/12 with worsening hyponatremia w/ NA+ drop to 123, fluid overload. , elevated BNP , worsening scr at 1.63 /BUN 75 .  He was recommended for admission he has declined. He refused a VQ scan .  Today he continues with lower ext swelling, weakness. No increased sob . CXR in office shows tiny bilateral pleural effusions. , right basilar atx  He is very unclear of his meds.  Med list includes ACE , Metformin along with aldactone , metalazone and demadex. He is unsure of what he taking.  Prev : ECHO 2011: The estimated ejection fraction was in the range of 55% to 60%. Wall motion was normal; there were no regional wall motion abnormalities. Features are consistent with a  pseudonormal left ventricular filling pattern, with concomitant abnormal relaxation and increased filling pressure (grade 2 diastolic dysfunction).    He says his MG has  Been under fair control with Mesitonin.  No increased muscle weakness, dysphagia or visual changes.   He denies chest pain, fever or n/v. No cough or discolored mucus       Review of Systems  Constitutional:   No  weight loss, night sweats,  Fevers, chills, +fatigue, lassitude. HEENT:   No headaches,  Difficulty  swallowing,  Tooth/dental problems,  Sore throat,                No sneezing, itching, ear ache, nasal congestion, post nasal drip,   CV:  No chest pain,    palpitations  GI  No heartburn, indigestion, abdominal pain, nausea, vomiting, diarrhea, change in bowel habits, loss of appetite  Resp: .  No excess mucus, no productive cough,  No non-productive cough,  No coughing up of blood.  No change in color of mucus.  No wheezing.  No chest wall deformity  Skin: no rash or lesions.  GU: no dysuria, change in color of urine, no urgency or frequency.  No flank pain.  MS:  No joint pain or swelling.  No decreased range of motion.  No back pain.  Psych:  No change in mood or affect. No depression or anxiety.  No memory loss.     Objective:   Physical Exam   Filed Vitals:   09/05/12 1233  BP: 134/82  Pulse: 76  Temp: 97.3 F (36.3 C)  TempSrc: Oral  Height: 5\' 7"  (1.702 m)  Weight: 193 lb 12.8 oz (87.907 kg)  SpO2: 98%    Gen: Pleasant, elderly dishelved male , tearful   ENT: No lesions,  mouth clear,  oropharynx clear, no postnasal drip  Neck: No JVD, no TMG, no carotid bruits  Lungs: No use of accessory muscles, no dullness to percussion, clear without rales or rhonchi  Cardiovascular: RRR, heart sounds normal, no murmur or gallops, 2-3 + peripheral edema  Abdomen: soft and NT, no HSM,  BS normal  Musculoskeletal: No deformities, no cyanosis or clubbing  Neuro: alert, non focal  Skin: Warm, no lesions or rashes       Assessment & Plan:   No problem-specific assessment & plan notes found for this encounter.   Updated Medication List Outpatient Encounter Prescriptions as of 09/05/2012  Medication Sig Dispense Refill  . mirtazapine (REMERON) 30 MG tablet Take 1 tablet (30 mg total) by mouth at bedtime.  30 tablet  1  . mycophenolate (CELLCEPT) 500 MG tablet Take 500 mg by mouth 2 (two) times daily.      Marland Kitchen oxyCODONE (OXY IR/ROXICODONE) 5 MG immediate release  tablet Take 1 tablet (5 mg total) by mouth every 4 (four) hours as needed. For pain  120 tablet  0  . pantoprazole (PROTONIX) 40 MG tablet Take 1 tablet (40 mg total) by mouth daily.  30 tablet  6  . predniSONE (DELTASONE) 5 MG tablet Take 15 mg by mouth daily.      Marland Kitchen pyridostigmine (MESTINON) 60 MG tablet Take 60 mg by mouth 5 (five) times daily. Medication starts to wear off closer to 3 hours      . torsemide (DEMADEX) 20 MG tablet Take 4 tablets (80 mg total) by mouth 2 (two) times daily.  240 tablet  3  . zolpidem (AMBIEN) 10 MG tablet Take 5 mg by mouth at bedtime.      Marland Kitchen  Alum & Mag Hydroxide-Simeth (MAGIC MOUTHWASH W/LIDOCAINE) SOLN Take 10 mLs by mouth 4 (four) times daily as needed (pain). Swish and spit.  250 mL  0  . benazepril (LOTENSIN) 40 MG tablet Take 40 mg by mouth daily before breakfast.      . metFORMIN (GLUCOPHAGE) 500 MG tablet Take 1 tablet (500 mg total) by mouth 2 (two) times daily with a meal.  60 tablet  0  . metolazone (ZAROXOLYN) 5 MG tablet Take 1 tablet (5 mg total) by mouth daily.      Marland Kitchen spironolactone (ALDACTONE) 25 MG tablet Take 25 mg by mouth daily.       No facility-administered encounter medications on file as of 09/05/2012.

## 2012-09-05 NOTE — Telephone Encounter (Signed)
Ok to work in with [employer], i have limited office time this week

## 2012-09-05 NOTE — Assessment & Plan Note (Signed)
Does not appear to be decompensated.  Appears to be more fluid overload.

## 2012-09-05 NOTE — Assessment & Plan Note (Addendum)
Decompensated CHF with worsening renal failure  Will need admission to sort out medication and adjust meds w/ IV diuresis as tolerated.  Consider repeat echo if possible.  CXR shows bilateral pleural effusion ? Consider ven doppler and VQ if d dimer elevated.    >pt has been recommended for admission with EMS transport.  Pt refuses to be admitted . Discussed case with Dr. Sherene Sires and Dr. Delford Field   Will contact IM clinic Dr. Tonny Branch to alert regarding pt condition.

## 2012-09-06 ENCOUNTER — Inpatient Hospital Stay (HOSPITAL_COMMUNITY): Payer: Medicare Other

## 2012-09-06 DIAGNOSIS — I5043 Acute on chronic combined systolic (congestive) and diastolic (congestive) heart failure: Secondary | ICD-10-CM

## 2012-09-06 DIAGNOSIS — G7000 Myasthenia gravis without (acute) exacerbation: Secondary | ICD-10-CM

## 2012-09-06 DIAGNOSIS — E1129 Type 2 diabetes mellitus with other diabetic kidney complication: Secondary | ICD-10-CM

## 2012-09-06 DIAGNOSIS — E871 Hypo-osmolality and hyponatremia: Secondary | ICD-10-CM

## 2012-09-06 LAB — GLUCOSE, CAPILLARY
Glucose-Capillary: 293 mg/dL — ABNORMAL HIGH (ref 70–99)
Glucose-Capillary: 370 mg/dL — ABNORMAL HIGH (ref 70–99)
Glucose-Capillary: 407 mg/dL — ABNORMAL HIGH (ref 70–99)

## 2012-09-06 LAB — PROTEIN / CREATININE RATIO, URINE
Creatinine, Urine: 54.98 mg/dL
Protein Creatinine Ratio: 2.56 — ABNORMAL HIGH (ref 0.00–0.15)
Total Protein, Urine: 140.7 mg/dL

## 2012-09-06 LAB — CBC
MCH: 27.3 pg (ref 26.0–34.0)
MCHC: 36.6 g/dL — ABNORMAL HIGH (ref 30.0–36.0)
MCV: 74.7 fL — ABNORMAL LOW (ref 78.0–100.0)
Platelets: 258 10*3/uL (ref 150–400)
RBC: 3.95 MIL/uL — ABNORMAL LOW (ref 4.22–5.81)

## 2012-09-06 LAB — COMPREHENSIVE METABOLIC PANEL
BUN: 74 mg/dL — ABNORMAL HIGH (ref 6–23)
CO2: 26 mEq/L (ref 19–32)
Chloride: 84 mEq/L — ABNORMAL LOW (ref 96–112)
Creatinine, Ser: 1.49 mg/dL — ABNORMAL HIGH (ref 0.50–1.35)
GFR calc non Af Amer: 46 mL/min — ABNORMAL LOW (ref >90)
Total Bilirubin: 0.5 mg/dL (ref 0.3–1.2)

## 2012-09-06 LAB — SODIUM, URINE, RANDOM: Sodium, Ur: 42 mEq/L

## 2012-09-06 LAB — TROPONIN I: Troponin I: <0.3 ng/mL (ref <0.30)

## 2012-09-06 MED ORDER — FUROSEMIDE 80 MG PO TABS
80.0000 mg | ORAL_TABLET | Freq: Three times a day (TID) | ORAL | Status: DC
  Administered 2012-09-06 – 2012-09-07 (×3): 80 mg via ORAL
  Filled 2012-09-06 (×5): qty 1

## 2012-09-06 MED ORDER — FUROSEMIDE 10 MG/ML IJ SOLN
60.0000 mg | Freq: Three times a day (TID) | INTRAMUSCULAR | Status: DC
  Administered 2012-09-06: 60 mg via INTRAVENOUS

## 2012-09-06 MED ORDER — PANTOPRAZOLE SODIUM 40 MG PO TBEC
40.0000 mg | DELAYED_RELEASE_TABLET | Freq: Once | ORAL | Status: AC
  Administered 2012-09-06: 40 mg via ORAL
  Filled 2012-09-06: qty 1

## 2012-09-06 MED ORDER — OXYCODONE HCL 5 MG PO TABS
5.0000 mg | ORAL_TABLET | Freq: Three times a day (TID) | ORAL | Status: DC | PRN
  Administered 2012-09-06: 5 mg via ORAL
  Filled 2012-09-06: qty 1

## 2012-09-06 MED ORDER — PNEUMOCOCCAL VAC POLYVALENT 25 MCG/0.5ML IJ INJ
0.5000 mL | INJECTION | INTRAMUSCULAR | Status: DC
  Filled 2012-09-06: qty 0.5

## 2012-09-06 MED ORDER — INSULIN ASPART 100 UNIT/ML ~~LOC~~ SOLN: 0.0000 [IU] | Freq: Every day | SUBCUTANEOUS | Status: DC

## 2012-09-06 MED ORDER — TECHNETIUM TC 99M DIETHYLENETRIAME-PENTAACETIC ACID: 40.0000 | Freq: Once | INTRAVENOUS | Status: AC | PRN

## 2012-09-06 MED ORDER — INSULIN ASPART 100 UNIT/ML ~~LOC~~ SOLN: 0.0000 [IU] | Freq: Three times a day (TID) | SUBCUTANEOUS | Status: DC

## 2012-09-06 MED ORDER — FUROSEMIDE 10 MG/ML IJ SOLN
INTRAMUSCULAR | Status: AC
  Filled 2012-09-06: qty 8

## 2012-09-06 MED ORDER — OXYCODONE HCL 5 MG PO TABS
5.0000 mg | ORAL_TABLET | Freq: Once | ORAL | Status: AC
  Administered 2012-09-06: 5 mg via ORAL
  Filled 2012-09-06: qty 1

## 2012-09-06 MED ORDER — PANTOPRAZOLE SODIUM 40 MG PO TBEC
40.0000 mg | DELAYED_RELEASE_TABLET | Freq: Every day | ORAL | Status: DC
  Administered 2012-09-06 – 2012-09-07 (×2): 40 mg via ORAL
  Filled 2012-09-06 (×2): qty 1

## 2012-09-06 MED ORDER — TECHNETIUM TO 99M ALBUMIN AGGREGATED
6.0000 | Freq: Once | INTRAVENOUS | Status: AC | PRN
  Administered 2012-09-06: 6 via INTRAVENOUS

## 2012-09-06 NOTE — Progress Notes (Signed)
Patient has been refusing insulin for CBG's in the 300's or less. He says that he's blood sugar has been in the 300's for many years and if it's lower than this then he doesn't feel like himself, he states " I start sweating and feeling shaky". Even after encouraging patient and informing him about the complications that could come from elevated blood glucose patient still refused insulin. CBG have been taken and patient is cooperative with this. Will place sticky note for physician to address for possible diabetic education consult.

## 2012-09-06 NOTE — H&P (Signed)
INTERNAL MEDICINE TEACHING SERVICE Attending Admission Note  Date: 09/06/2012  Patient name: [name]  Medical record number: [id number]  Date of birth: 1945-12-22    I have seen and evaluated [name] and discussed their care with the Residency Team. 58 yr. Old WM w/ pmhx significant for MG, chronic diastolic/systolic heart failure (HFPEF vs HFREF), HL, depression, Type 2 DM, hx C diff, OSA, presented due to SOB and increased bilateral LE edema.  He states that he "lives the single-man's life" and has recently been eating a generous amount of saltine-crackers, which he feels are to blame for his symptoms.  He was diuresed in the ED and today feels much better. He was observed by me personally walking the hallway outside his room without visible dyspnea.   He had a V/Q scan done which is low probability for PE. He had an initial elevated i-stat trop i, marginally, but subsequent trop i have been negative x 3.  His Na on admission was 121, but corrected for Glucose of 337 is about 125.  His pro-BNP is marginally changed from prior known value.  His weight last weight on 3/26 was 186 lbs, compared to  193 lbs on admission (accuracy is questionable).  His I/O's also have questionable accuracy.   Physical Exam: Blood pressure 145/63, pulse 90, temperature 97.5 F (36.4 C), temperature source Oral, resp. rate 20, height 5\' 7"  (1.702 m), weight 188 lb 14.4 oz (85.684 kg), SpO2 94.00%.  General: Vital signs reviewed and noted. Well-developed, well-nourished, in no acute distress; alert, appropriate and cooperative throughout examination.  Head: Normocephalic, atraumatic.  Eyes: PERRL, EOMI, No signs of anemia or jaundince.  Nose: Mucous membranes moist, not inflammed, nonerythematous.  Throat: Oropharynx nonerythematous, no exudate appreciated.   Neck: No deformities, masses, or tenderness noted.Supple, No carotid Bruits, no JVD.  Lungs:  Normal respiratory effort. Clear to auscultation BL  without crackles or wheezes.  Heart: RRR. S1 and S2 normal without gallop, murmur, or rubs.  Abdomen:  BS normoactive. Soft, Nondistended, non-tender.  No masses or organomegaly.  Extremities: 2+ pitting edema RLE, 3+ pitting edema in LLE.  Neurologic: A&O X3, CN II - XII are grossly intact. Motor strength is 5/5 in the all 4 extremities, Sensations intact to light touch, Cerebellar signs negative.  Skin: No visible rashes, scars.    Lab results: Results for orders placed during the hospital encounter of 09/05/12 (from the past 24 hour(s))  PRO B NATRIURETIC PEPTIDE     Status: Abnormal   Collection Time    09/05/12  7:20 PM      Result Value Range   Pro B Natriuretic peptide (BNP) 2519.0 (*) 0 - 125 pg/mL  COMPREHENSIVE METABOLIC PANEL     Status: Abnormal   Collection Time    09/05/12  7:20 PM      Result Value Range   Sodium 121 (*) 135 - 145 mEq/L   Potassium 4.7  3.5 - 5.1 mEq/L   Chloride 82 (*) 96 - 112 mEq/L   CO2 26  19 - 32 mEq/L   Glucose, Bld 337 (*) 70 - 99 mg/dL   BUN 73 (*) 6 - 23 mg/dL   Creatinine, Ser 4.09  0.50 - 1.35 mg/dL   Calcium 9.8  8.4 - 73.5 mg/dL   Total Protein 6.8  6.0 - 8.3 g/dL   Albumin 3.5  3.5 - 5.2 g/dL   AST 15  0 - 37 U/L   ALT 8  0 -  53 U/L   Alkaline Phosphatase 150 (*) 39 - 117 U/L   Total Bilirubin 0.4  0.3 - 1.2 mg/dL   GFR calc non Af Amer 52 (*) >90 mL/min   GFR calc Af Amer 61 (*) >90 mL/min  APTT     Status: None   Collection Time    09/05/12  7:20 PM      Result Value Range   aPTT 30  24 - 37 seconds  PROTIME-INR     Status: None   Collection Time    09/05/12  7:20 PM      Result Value Range   Prothrombin Time 11.7  11.6 - 15.2 seconds   INR 0.86  0.00 - 1.49  POCT I-STAT TROPONIN I     Status: Abnormal   Collection Time    09/05/12  7:39 PM      Result Value Range   Troponin i, poc 0.09 (*) 0.00 - 0.08 ng/mL   Comment NOTIFIED PHYSICIAN     Comment 3           POCT I-STAT, CHEM 8     Status: Abnormal   Collection  Time    09/05/12  7:41 PM      Result Value Range   Sodium 122 (*) 135 - 145 mEq/L   Potassium 4.7  3.5 - 5.1 mEq/L   Chloride 89 (*) 96 - 112 mEq/L   BUN 68 (*) 6 - 23 mg/dL   Creatinine, Ser 1.61 (*) 0.50 - 1.35 mg/dL   Glucose, Bld 096 (*) 70 - 99 mg/dL   Calcium, Ion 0.45  4.09 - 1.30 mmol/L   TCO2 29  0 - 100 mmol/L   Hemoglobin 11.9 (*) 13.0 - 17.0 g/dL   HCT 81.1 (*) 91.4 - 78.2 %  CBC     Status: Abnormal   Collection Time    09/05/12  8:21 PM      Result Value Range   WBC 11.2 (*) 4.0 - 10.5 K/uL   RBC 4.22  4.22 - 5.81 MIL/uL   Hemoglobin 11.6 (*) 13.0 - 17.0 g/dL   HCT 95.6 (*) 21.3 - 08.6 %   MCV 74.9 (*) 78.0 - 100.0 fL   MCH 27.5  26.0 - 34.0 pg   MCHC 36.7 (*) 30.0 - 36.0 g/dL   RDW 57.8  46.9 - 62.9 %   Platelets 289  150 - 400 K/uL  TROPONIN I     Status: None   Collection Time    09/05/12 11:34 PM      Result Value Range   Troponin I <0.30  <0.30 ng/mL  OSMOLALITY     Status: None   Collection Time    09/05/12 11:34 PM      Result Value Range   Osmolality 296  275 - 300 mOsm/kg  GLUCOSE, CAPILLARY     Status: Abnormal   Collection Time    09/05/12 11:52 PM      Result Value Range   Glucose-Capillary 300 (*) 70 - 99 mg/dL  GLUCOSE, CAPILLARY     Status: Abnormal   Collection Time    09/06/12  4:44 AM      Result Value Range   Glucose-Capillary 293 (*) 70 - 99 mg/dL  SODIUM, URINE, RANDOM     Status: None   Collection Time    09/06/12  4:51 AM      Result Value Range   Sodium, Ur 42    OSMOLALITY, URINE  Status: Abnormal   Collection Time    09/06/12  4:51 AM      Result Value Range   Osmolality, Ur 274 (*) 390 - 1090 mOsm/kg  TROPONIN I     Status: None   Collection Time    09/06/12  5:10 AM      Result Value Range   Troponin I <0.30  <0.30 ng/mL  COMPREHENSIVE METABOLIC PANEL     Status: Abnormal   Collection Time    09/06/12  5:10 AM      Result Value Range   Sodium 125 (*) 135 - 145 mEq/L   Potassium 4.2  3.5 - 5.1 mEq/L    Chloride 84 (*) 96 - 112 mEq/L   CO2 26  19 - 32 mEq/L   Glucose, Bld 279 (*) 70 - 99 mg/dL   BUN 74 (*) 6 - 23 mg/dL   Creatinine, Ser 1.61 (*) 0.50 - 1.35 mg/dL   Calcium 9.7  8.4 - 09.6 mg/dL   Total Protein 6.3  6.0 - 8.3 g/dL   Albumin 3.2 (*) 3.5 - 5.2 g/dL   AST 13  0 - 37 U/L   ALT 8  0 - 53 U/L   Alkaline Phosphatase 127 (*) 39 - 117 U/L   Total Bilirubin 0.5  0.3 - 1.2 mg/dL   GFR calc non Af Amer 46 (*) >90 mL/min   GFR calc Af Amer 54 (*) >90 mL/min  CBC     Status: Abnormal   Collection Time    09/06/12  5:10 AM      Result Value Range   WBC 8.9  4.0 - 10.5 K/uL   RBC 3.95 (*) 4.22 - 5.81 MIL/uL   Hemoglobin 10.8 (*) 13.0 - 17.0 g/dL   HCT 04.5 (*) 40.9 - 81.1 %   MCV 74.7 (*) 78.0 - 100.0 fL   MCH 27.3  26.0 - 34.0 pg   MCHC 36.6 (*) 30.0 - 36.0 g/dL   RDW 91.4  78.2 - 95.6 %   Platelets 258  150 - 400 K/uL  PROTEIN / CREATININE RATIO, URINE     Status: Abnormal   Collection Time    09/06/12  7:39 AM      Result Value Range   Creatinine, Urine 54.98     Total Protein, Urine 140.7     PROTEIN CREATININE RATIO 2.56 (*) 0.00 - 0.15  GLUCOSE, CAPILLARY     Status: Abnormal   Collection Time    09/06/12  7:44 AM      Result Value Range   Glucose-Capillary 379 (*) 70 - 99 mg/dL  GLUCOSE, CAPILLARY     Status: Abnormal   Collection Time    09/06/12  9:01 AM      Result Value Range   Glucose-Capillary 370 (*) 70 - 99 mg/dL   Comment 1 Documented in Chart     Comment 2 Notify RN    GLUCOSE, CAPILLARY     Status: Abnormal   Collection Time    09/06/12 11:18 AM      Result Value Range   Glucose-Capillary 315 (*) 70 - 99 mg/dL  TROPONIN I     Status: None   Collection Time    09/06/12 11:36 AM      Result Value Range   Troponin I <0.30  <0.30 ng/mL    Imaging results:  Dg Chest 2 View  09/05/2012  *RADIOLOGY REPORT*  Clinical Data: Shortness of breath.  CHEST - 2  VIEW  Comparison: 09/03/2012.  Findings: Trachea is midline.  Heart size normal.  Mild  bibasilar air space disease and small right pleural effusion persist.  Tiny left pleural effusion.  IMPRESSION: Tiny bilateral pleural effusions with mild right basilar airspace disease.   Original Report Authenticated By: Leanna Battles, M.D.    Nm Pulmonary Perf And Vent  09/06/2012  *RADIOLOGY REPORT*  Clinical Data: History of shortness of breath and left-sided pulmonary nodules, the patient refused the complete DTPA ventilation images.  NM PULMONARY VENTILATION AND PERFUSION SCAN  Technique:  Wash-in, equilibrium, and wash-out phase ventilation images were obtained technetium DTPA.  Perfusion images were obtained in multiple projections after intravenous injection of Tc- 73m MAA.  Radiopharmaceuticals:  40 mCi technetium DTPA and 6 mCi Tc-70m MAA.  Comparison:  Chest radiograph - 09/05/2012; 06/10/2012  Findings:  Chest radiograph: Review of the preceding chest radiograph demonstrates trace bilateral pleural effusions and bibasilar heterogeneous air space opacities, right greater than left.  Mild cephalization of flow without frank evidence of edema.  Ventilation images:  As above, the patient refused the complete DTPA ventilation images, however anterior and posterior projection images were obtained and are negative for discrete area of non ventilation.  Ingested radiotracer is seen within the hypopharynx, mid-esophagus and upper abdomen.  Perfusion images:  There is relative homogeneous distribution of injected radiotracer throughout the bilateral pulmonary parenchyma. No discrete segmental or subsegmental filling defects.  IMPRESSION: Pulmonary embolism absent (very low probability for pulmonary embolism).   Original Report Authenticated By: Tacey Ruiz, MD      Assessment and Plan: I agree with the formulated Assessment and Plan with the following changes:  1 yr. Old WM w/ pmhx significant for MG, chronic diastolic/systolic heart failure (HFPEF vs HFREF), HL, depression, Type 2 DM, hx C diff, OSA,  presented due to SOB and increased bilateral LE edema, with likely acute on chronic systolic heart failure. 1) Acute on chronic systolic heart failure: attempt to obtain most recent echo, if not this may have to be repeated to see what his EF is at this time.  This is likely secondary to dietary indiscretion, as the patient admits.  He prefers Lasix to demadex, so this may be changed per patient preference. I agree with recent IV diuresis, extra 60 mg IV lasix given this morning. May continue metolazone.  2) Hyponatremia:  Likely hypervolemic in setting of CHF, hopefully this will improve with diuresis. UNa not useful in setting of recent diuretic use.  Metolazone may be contributing as well. 3) CKD stage 3: Urine prot/creat reviewed. Likely secondary to diabetic nephropathy, I doubt cardio renal unless his EF is markedly reduced.  ACE-i for now, would not add CCB in setting of CHF. 4) Type 2 DM: Does not want education and refuses insulin. Poorly controlled. HgbA1C of 11.9%. His CKD will progress to ESRD at this rate, and he understands this. I would be VERY careful with metformin use in this patient, consider alternative agent. 5) rest per resident note.  Jonah Blue, DO 4/1/20142:31 PM

## 2012-09-06 NOTE — Progress Notes (Signed)
Pt HR looked irregular possible afib/NSR on monitor. EKG done to confirm rhythm. MD notified. Pt request pain medicine at 10pm tonight and an antacid. MD notified and new orders given. Baron Hamper, RN RN 09/06/2012 8:58 PM

## 2012-09-06 NOTE — Progress Notes (Signed)
4098 - arrived to pt room to give prn Mastinon which is due now.  Pt stated he had taken pill from home supply.  Reinforced need to call nurse for prns from hospital supply.  He stated his doctors are aware and he only takes when we are not here at time needed.  Gave prn dose earlier today from hospital supply.

## 2012-09-06 NOTE — Progress Notes (Signed)
09/05/12 2200 Patient was admitted to floor by wheelchair and without oxygen. A/O x 4. He has no signs of respiratory distress. ED nurse and internal medicine physicians accompanied patient to room. Patient appeared to show signs of agitation and irritability with physicians and nursing staff. In the presence of the internal medicine physicians and RN patient refused to have home po medication ( for myasthenia gravis)  taken to pharmacy. Patient states " I need the this medication to breathe, I take this medication all the time at home, you are not taking this medication from me and that's that !" Even after explaining and encouraging to patient the importance of pharmacy keeping the medication and physicians/staff being aware of when he take his medication to avoid possible complications. Patient still refused. Physicians and pharmacy are aware that patient is keeping his medication in his pocket. Charge nurse was present during the admission and was also aware this as well as the [facility] nurses who were called to the room. Patient has a history of leaving AMA. 2330 Pt.was requesting aleve po in addition to the oxycodone po that he receives scheduled. Even after explaining to the patient that the oxycodone would help with pain, he still was adamant about receiving the aleve Pt.states that " The aleve I take at home and I take that for a different kind of pain, back pain" MD on call for internal medicine was called and notified. Was told medication wouldn't be given due to renal function. Patient received scheduled oxycodone. Pt. Is non-compliant with bed alarm placement, calling for assistance x 1, and fluid restriction. Pt.is in camera room for safety. He appears to have a steady gait. He still has some irritability present but is more cooperative at the moment.

## 2012-09-06 NOTE — Progress Notes (Signed)
Pt CBG >300. Pt refused SSI tonight. Will continue to monitor. Baron Hamper RN 09/06/2012 10:21 PM

## 2012-09-06 NOTE — Progress Notes (Signed)
Telemetry called from stating the patient may be in AF.  He is asymptomatic but will get EKG.   Shirlee Latch MD [phone]

## 2012-09-06 NOTE — Progress Notes (Signed)
Patient's cbg @ lunch was 315 and patient refused SSI coverage.  Diabetic resource nurse rounded and reviewed diabetes management today.  CBG pre-dinner = 407.  Patient refused SSI for glucose of 407. Alert and oriented and skin warm and dry.  Also discussed fluid intake and need to regulate per receiving lasix for fluid removal.  Stated understanding of all above.

## 2012-09-06 NOTE — Progress Notes (Signed)
Subjective:    Interval Events:  Patient feels okay. Still has significant orthopnea. Denies dyspnea otherwise. Denies chest pain or dizziness. Still has lower extremity swelling.     Objective:    Vital Signs:   Temp:  [97.5 F (36.4 C)-98.1 F (36.7 C)] 97.5 F (36.4 C) (04/01 1406) Pulse Rate:  [52-100] 90 (04/01 1406) Resp:  [17-26] 20 (04/01 1406) BP: (127-170)/(61-122) 145/63 mmHg (04/01 1406) SpO2:  [94 %-100 %] 94 % (04/01 1406) Weight:  [187 lb (84.823 kg)-189 lb 6 oz (85.9 kg)] 188 lb 14.4 oz (85.684 kg) (04/01 0446) Last BM Date: 09/05/12   Weights: Filed Weights   09/05/12 1821 09/05/12 2300 09/06/12 0446  Weight: 187 lb (84.823 kg) 189 lb 6 oz (85.9 kg) 188 lb 14.4 oz (85.684 kg)   Net since admission:  -0.22kg   Intake/Output:   Intake/Output Summary (Last 24 hours) at 09/06/12 1532 Last data filed at 09/06/12 1432  Gross per 24 hour  Intake   1983 ml  Output   2075 ml  Net    -92 ml      Physical Exam: GENERAL: alert and oriented; standing in his room comfortably, no distress EYES: pupils equal, round, and reactive to light; sclera anicteric ENT: moist mucosa NECK: no JVD while standing LUNGS: clear to auscultation bilaterally, normal work of breathing HEART: Irregular rate; regular rhythm; normal S1 and S2, no S3 or S4 appreciated; crescendo-decrescendo systolic murmur heard throughout the precordium but loudest at the upper sternal borders ABDOMEN: soft, non-tender, normal bowel sounds, no masses palpated EXTREMITIES: 2+ pitting edema SKIN: normal turgor    Labs: Basic Metabolic Panel:  Recent Labs Lab 08/31/12 1044 09/03/12 1655 09/05/12 1920 09/05/12 1941 09/06/12 0510  NA 133* 123* 121* 122* 125*  K 3.9 4.6 4.7 4.7 4.2  CL 92* 85* 82* 89* 84*  CO2 30 21 26   --  26  GLUCOSE 339* 283* 337* 335* 279*  BUN 71* 75* 73* 68* 74*  CREATININE 1.38* 1.63* 1.35 1.70* 1.49*  CALCIUM 9.5 9.7 9.8  --  9.7    Liver Function  Tests:  Recent Labs Lab 09/05/12 1920 09/06/12 0510  AST 15 13  ALT 8 8  ALKPHOS 150* 127*  BILITOT 0.4 0.5  PROT 6.8 6.3  ALBUMIN 3.5 3.2*    CBC:  Recent Labs Lab 09/03/12 1655 09/05/12 1941 09/05/12 2021 09/06/12 0510  WBC 9.2  --  11.2* 8.9  NEUTROABS 6.2  --   --   --   HGB 11.3* 11.9* 11.6* 10.8*  HCT 30.8* 35.0* 31.6* 29.5*  MCV 75.5*  --  74.9* 74.7*  PLT 304  --  289 258    Cardiac Enzymes:  Recent Labs Lab 09/03/12 1735 09/05/12 2334 09/06/12 0510 09/06/12 1136  TROPONINI <0.30 <0.30 <0.30 <0.30    BNP (last 3 results):  Recent Labs  08/28/12 1511 09/03/12 1656 09/05/12 1920  PROBNP 2788.0* 2552.0* 2519.0*    CBG:  Recent Labs Lab 09/05/12 2352 09/06/12 0444 09/06/12 0744 09/06/12 0901 09/06/12 1118  GLUCAP 300* 293* 379* 370* 315*    Coagulation Studies:  Recent Labs  09/05/12 1920  LABPROT 11.7  INR 0.86     Imaging: Nm Pulmonary Perf And Vent 09/06/2012  FINDINGS:  Chest radiograph: Review of the preceding chest radiograph demonstrates trace bilateral pleural effusions and bibasilar heterogeneous air space opacities, right greater than left.  Mild cephalization of flow without frank evidence of edema.  Ventilation images:  As above,  the patient refused the complete DTPA ventilation images, however anterior and posterior projection images were obtained and are negative for discrete area of non ventilation.  Ingested radiotracer is seen within the hypopharynx, mid-esophagus and upper abdomen.  Perfusion images:  There is relative homogeneous distribution of injected radiotracer throughout the bilateral pulmonary parenchyma. No discrete segmental or subsegmental filling defects.   IMPRESSION: Pulmonary embolism absent (very low probability for pulmonary embolism).    Medications:    Infusions:     Scheduled Medications: . furosemide  80 mg Oral TID  . heparin  5,000 Units Subcutaneous Q8H  . insulin aspart  0-15 Units  Subcutaneous TID WC  . insulin aspart  0-5 Units Subcutaneous QHS  . mirtazapine  30 mg Oral QHS  . mycophenolate  500 mg Oral BID  . pantoprazole  40 mg Oral Daily  . [START ON 09/07/2012] pneumococcal 23 valent vaccine  0.5 mL Intramuscular Tomorrow-1000  . predniSONE  15 mg Oral Q breakfast  . sodium chloride  3 mL Intravenous Q12H  . zolpidem  5 mg Oral QHS     PRN Medications: sodium chloride, acetaminophen, acetaminophen, alum & mag hydroxide-simeth, magic mouthwash w/lidocaine, ondansetron (ZOFRAN) IV, ondansetron, pyridostigmine, sodium chloride, technetium TC 57M diethylenetriame-pentaacetic acid    Assessment/ Plan:    1.   Acute exacerbation of chronic CHF: His last echocardiogram was in 2011, demonstrating normal systolic function with grade 2 diastolic dysfunction. Fluid balance is been difficult with this patient for some time. He consumes a tremendous amount of sodium at home despite our continued recommendations against this. He was recently transitioned to torsemide 80 mg twice a day and metolazone 5 mg daily. He came to the emergency department with significant dyspnea and orthopnea. ProBNP was 2500. He initially got 40 mg IV furosemide in the emergency department. We administered 80 mg IV this morning and has now transitioned to by mouth 80 mg 3 times a day. We will follow his creatinine closely as we try to diurese him. We did obtain urine protein to creatinine ratio which was quite elevated. 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 - Transitioned to oral furosemide 80 mg 3 times a day - BMP in the morning  2.   Hyponatremia:  This is been a problem for some time. His sodium this morning was 125. This is up from 121 yesterday. Based on the exam, this is hypervolemic hyponatremia most likely from CHF but the frozen may be contributing. We will continue diuresing to address this problem.  3.   Acute kidney injury and stage I CKD:  Back in November and December of 2013  his creatinine was normal. It has been steadily in the 1.5-1.7 range this year. I think the largest contributing factor to this is cardiac renal syndrome from his overloaded fluid status, but as the proteinuria suggests, he likely has some progression of chronic kidney disease secondary to diabetic nephropathy. We will continue to diurese and follow his creatinine closely.  4.   Chronic microcytic anemia:  Baseline hemoglobin is around 12. It is currently 10.8. I expect this to rise somewhat with diuresis. His MCV is 74.7. It has been in the 70s since early March. No iron panel on file. - Iron panel and reticulocyte count ordered  5.   Type II diabetes mellitus, uncontrolled, with complications:  Patient has persistently refused to allow [place] to treat his diabetes. Despite trying to educate him over and over again, he does not want his sugars to  fall below 300. He will leave AMA if we try to do this without his approval. His last hemoglobin A1c was 11.9 in February. We currently have correctional spell insulin aspart ordered, but he has been refusing this. We are holding his home metformin while in the hospital.  6.   Hypertension:  In addition to diuretics, he takes benazepril 40 mg daily. Over the past year his blood pressure has averaged about 150/85. His blood pressures have been running in a similar range here in the hospital. Verlon Au currently holding the ACE inhibitor in the setting of acute kidney injury.  7.   Myasthenia gravis:  Managed at [facility] for this. Stable on his home regimen which we have continued here. - Mycophenolate 500 mg twice a day - Prednisone 50 mg daily - Pyridostigmine 60mg  every 2 hours when necessary  8.   Nephropathy:  As evidenced by elevated creatinine and urine protein to creatinine ratio of 2.56. The most likely etiology is diabetic nephropathy from this patient's long-standing uncontrolled diabetes. He has been on an ACE inhibitor previously but we are holding it right now  while we try to sort out his renal insufficiency.  9.   Prophylaxis:  Heparin 5000 units subcutaneous every 8 hours  10. Disposition:  Patient's PCP is Bard Herbert.  He will require OPC followup. He will not need outpatient services. He has no transportation limitations. Expected discharge date is tomorrow.     Length of Stay: 1 days   Signed by:  Dorthula Rue. Earlene Plater, MD PGY-I, Internal Medicine Pager [phone]  09/06/2012, 3:32 PM

## 2012-09-06 NOTE — Progress Notes (Signed)
  Echocardiogram 2D Echocardiogram has been performed.  [name] 09/06/2012, 3:11 PM

## 2012-09-06 NOTE — Progress Notes (Signed)
Utilization Review Completed Diamonds Lippard J. Damarkus Balis, RN, BSN, NCM [phone]  

## 2012-09-06 NOTE — Progress Notes (Signed)
Inpatient Diabetes Program Recommendations  AACE/ADA: New Consensus Statement on Inpatient Glycemic Control (2013)  Target Ranges:  Prepandial:   less than 140 mg/dL      Peak postprandial:   less than 180 mg/dL (1-2 hours)      Critically ill patients:  140 - 180 mg/dL    Admitted with CHF and hyponatremia.  Has history of uncontrolled diabetes.  Last A1c on file was 11.9% (07/12/12).  Noted patient has been refusing insulin throughout this hospitalization.  Patient states he feels really bad when his CBG drops below 300 mg/dl.  Patient stated he tries to keep his glucose levels between 300-350 mg/dl at home.  Checks CBGs once daily in the morning and prn if he "feels bad".    Attempted to explain how dangerous high blood sugar levels can be on the body, however, patient stated he knows how to take care of himself and has "bigger problems" than his diabetes.  Patient stated he sees his PCP regularly and will take his Metformin sporadically.  Patient told me he tried insulin at one point, however, it dropped his sugar too much and made him feel worse.  Attempted to give patient educational pamphlets on diabetes and the importance of good CBG control, however, patient stated he did not want them.  Will follow. Ambrose Finland RN, MSN, CDE Diabetes Coordinator Inpatient Diabetes Program [phone]

## 2012-09-07 DIAGNOSIS — I5033 Acute on chronic diastolic (congestive) heart failure: Secondary | ICD-10-CM

## 2012-09-07 DIAGNOSIS — I129 Hypertensive chronic kidney disease with stage 1 through stage 4 chronic kidney disease, or unspecified chronic kidney disease: Secondary | ICD-10-CM

## 2012-09-07 LAB — IRON AND TIBC
Iron: 125 ug/dL (ref 42–135)
Saturation Ratios: 39 % (ref 20–55)
UIBC: 199 ug/dL (ref 125–400)

## 2012-09-07 LAB — BASIC METABOLIC PANEL
Chloride: 87 mEq/L — ABNORMAL LOW (ref 96–112)
GFR calc Af Amer: 58 mL/min — ABNORMAL LOW (ref >90)
Potassium: 4.3 mEq/L (ref 3.5–5.1)

## 2012-09-07 LAB — GLUCOSE, CAPILLARY: Glucose-Capillary: 245 mg/dL — ABNORMAL HIGH (ref 70–99)

## 2012-09-07 MED ORDER — FUROSEMIDE 80 MG PO TABS: 80.0000 mg | ORAL_TABLET | Freq: Three times a day (TID) | ORAL | Status: DC

## 2012-09-07 NOTE — Discharge Summary (Signed)
INTERNAL MEDICINE TEACHING SERVICE Attending Note  Date: 09/07/2012  Patient name: [name]  Medical record number: [id number]  Date of birth: 30-Sep-1945    This patient has been seen and discussed with the house staff. Please see their note for complete details. I concur with their findings with the following additions/corrections: Discussed importance of DM and diet adherence with patient.  He will need very close follow up in clinic. Extensive instructions given to him.  Jonah Blue, DO  09/07/2012, 1:20 PM

## 2012-09-07 NOTE — Progress Notes (Signed)
Subjective:    Interval Events:  Patient says he slept very well last night. He was able to lie down more so than he has in the past last night. He says his breathing is improved. He says the swelling in his legs is also improved. We discussed the results of his urine protein creatinine test and his echocardiogram this morning.     Objective:    Vital Signs:   Filed Vitals:   09/06/12 0948 09/06/12 1406 09/06/12 2100 09/07/12 0511  BP: 149/68 145/63 149/66 137/86  Pulse: 81 90 89 88  Temp:  97.5 F (36.4 C) 97.7 F (36.5 C) 97.7 F (36.5 C)  TempSrc:  Oral Oral Oral  Resp: 20 20 20 18   Height:      Weight:    188 lb 3.2 oz (85.367 kg)  SpO2:  94% 99% 100%     Weights: 24-hour Weight change: 1 lb 3.2 oz (0.544 kg)  Filed Weights   09/05/12 2300 09/06/12 0446 09/07/12 0511  Weight: 189 lb 6 oz (85.9 kg) 188 lb 14.4 oz (85.684 kg) 188 lb 3.2 oz (85.367 kg)   Net since admission:  -0.533 kg   Intake/Output:   Intake/Output Summary (Last 24 hours) at 09/07/12 0846 Last data filed at 09/07/12 0700  Gross per 24 hour  Intake   1420 ml  Output   3550 ml  Net  -2130 ml     Net since admission:  -1.34 L   Physical Exam: GENERAL: alert and oriented; standing comfortably in his room and in no distress LUNGS: clear to auscultation bilaterally, normal work of breathing HEART: normal rate; regular rhythm; normal S1 and S2, no S3 or S4 appreciated; no murmurs, rubs, or clicks ABDOMEN: soft, non-tender, normal bowel sounds, no masses palpated EXTREMITIES: 2+ edema SKIN: normal turgor    Labs: Basic Metabolic Panel:  Recent Labs Lab 08/31/12 1044 09/03/12 1655 09/05/12 1920 09/05/12 1941 09/06/12 0510 09/07/12 0555  NA 133* 123* 121* 122* 125* 126*  K 3.9 4.6 4.7 4.7 4.2 4.3  CL 92* 85* 82* 89* 84* 87*  CO2 30 21 26   --  26 29  GLUCOSE 339* 283* 337* 335* 279* 236*  BUN 71* 75* 73* 68* 74* 67*  CREATININE 1.38* 1.63* 1.35 1.70* 1.49* 1.41*  CALCIUM 9.5 9.7  9.8  --  9.7 9.7    CBC:  Recent Labs Lab 09/03/12 1655 09/05/12 1941 09/05/12 2021 09/06/12 0510  WBC 9.2  --  11.2* 8.9  NEUTROABS 6.2  --   --   --   HGB 11.3* 11.9* 11.6* 10.8*  HCT 30.8* 35.0* 31.6* 29.5*  MCV 75.5*  --  74.9* 74.7*  PLT 304  --  289 258    Iron/TIBC/Ferritin   No results found for this basename: iron,  tibc,  ferritin    Reticulocytes Count - 2.4% Index -  1.82  Cardiac Enzymes:  Recent Labs Lab 09/03/12 1735 09/05/12 2334 09/06/12 0510 09/06/12 1136  TROPONINI <0.30 <0.30 <0.30 <0.30    CBG:  Recent Labs Lab 09/06/12 0744 09/06/12 0901 09/06/12 1118 09/06/12 1612 09/06/12 2026  GLUCAP 379* 370* 315* 407* 338*     Medications:    Infusions:     Scheduled Medications: . furosemide  80 mg Oral TID  . heparin  5,000 Units Subcutaneous Q8H  . insulin aspart  0-15 Units Subcutaneous TID WC  . insulin aspart  0-5 Units Subcutaneous QHS  . mirtazapine  30 mg Oral QHS  .  mycophenolate  500 mg Oral BID  . pantoprazole  40 mg Oral Daily  . pneumococcal 23 valent vaccine  0.5 mL Intramuscular Tomorrow-1000  . predniSONE  15 mg Oral Q breakfast  . sodium chloride  3 mL Intravenous Q12H  . zolpidem  5 mg Oral QHS     PRN Medications: sodium chloride, acetaminophen, acetaminophen, alum & mag hydroxide-simeth, magic mouthwash w/lidocaine, ondansetron (ZOFRAN) IV, ondansetron, oxyCODONE, pyridostigmine, sodium chloride    Assessment/ Plan:    1.   Acute exacerbation of chronic CHF: His last echocardiogram was in 2011, demonstrating normal systolic function with grade 2 diastolic dysfunction. The echocardiogram performed yesterday demonstrates vigorous systolic function with an EF of 65-70% but grade 3 diastolic dysfunction. Fluid balance is been difficult with this patient for some time. He consumes a tremendous amount of sodium at home despite our continued recommendations against this. He was recently transitioned to torsemide 80  mg twice a day and metolazone 5 mg daily. He came to the emergency department with significant dyspnea and orthopnea. ProBNP was 2500. He initially got 40 mg IV furosemide in the emergency department. We administered 80 mg IV yesterday morning and have now transitioned to PO 80 mg 3 times a day. We will follow his creatinine closely as we try to diurese him. We did obtain urine protein to creatinine ratio which was quite elevated. Protein nephropathy is contributing as well. - Transitioned to oral furosemide 80 mg 3 times a day  2.   Hyponatremia:  This is been a problem for some time. His sodium this morning increased to 126 from 125 yesterday; this corrects to 128, accounting for glucose.  Based on the exam, this is hypervolemic hyponatremia; CHF from his grade 3 diastolic dysfunction may be contributing but it seems nephropathy is the primary cause now. We will continue diuresing to address this problem.  3.   Acute kidney injury and stage III CKD:  Back in November and December of 2013 his creatinine was normal. It has been steadily in the 1.5-1.7 range this year. Given the findings from the echocardiogram yesterday and the elevated urine protein to creatinine ratio, I think progression of diabetic nephropathy is the primary cause; he is not yet in nephrotic range proteinuria but is spilling significant protein into his urine. His creatinine has down treated with diuresis, indicating a small component of cardiorenal syndrome. We will continue to diurese and follow his creatinine closely.  4.   Chronic microcytic anemia:  Baseline hemoglobin is around 12. It is currently 10.8. Reticulocyte count is 2.4% with an index of 1.82, indicating low but near-normal reticulocyte production. Iron panel is pending. - Iron panel pending  5.   Type II diabetes mellitus, uncontrolled, with complications:  Patient has persistently refused to allow [place] to treat his diabetes. Despite trying to educate him over and over  again, he does not want his sugars to fall below 300. He will leave AMA if we try to do this without his approval. His last hemoglobin A1c was 11.9 in February. We currently have correctional scale insulin aspart ordered, but he has been refusing this. We are holding his home metformin while in the hospital. We discussed the implications of continued noncompliance with glycemic control regimens including the possibility of dialysis. I think he is beginning to consider restarting an aggressive insulin strategy.  6.   Hypertension:  In addition to diuretics, he takes benazepril 40 mg daily. Over the past year his blood pressure has averaged about  150/85. His blood pressures have been running in a similar range here in the hospital. Verlon Au currently holding the ACE inhibitor in the setting of acute kidney injury.  7.   Myasthenia gravis:  Managed at [facility] for this. Stable on his home regimen which we have continued here. - Mycophenolate 500 mg twice a day - Prednisone 50 mg daily - Pyridostigmine 60mg  every 2 hours when necessary  8.   Nephropathy:  As evidenced by elevated creatinine and urine protein to creatinine ratio of 2.56. The most likely etiology is diabetic nephropathy from this patient's long-standing, uncontrolled diabetes. He has been on an ACE inhibitor previously but we are holding it right now while we try to sort out his renal insufficiency.  9.   Prophylaxis:  Heparin 5000 units subcutaneous every 8 hours  10. Disposition:  Patient's PCP is [facility].  He will require OPC followup. He will not need outpatient services. He has no transportation limitations. Expected discharge date is tomorrow.    Length of Stay: 2 days   Signed by:  Dorthula Rue. Earlene Plater, MD PGY-I, Internal Medicine Pager [phone]  09/07/2012, 8:46 AM

## 2012-09-07 NOTE — Progress Notes (Signed)
Pt CBG 245 this AM. Pt refused SSI and requested coffee with sugar and water. Pt educated on importance of fluid restriction and carb modified diet. Baron Hamper, RN 6:03 AM 09/07/2012

## 2012-09-07 NOTE — Discharge Summary (Signed)
Patient Name:  [name] MRN: [id number]  PCP: Leodis Sias, MD DOB:  1945-12-12       Date of Admission:  09/05/2012  Date of Discharge:  09/07/2012      Attending Physician: Dr. Kem Kays         DISCHARGE DIAGNOSES: 1. Acute exacerbation of chronic diastolic CHF 2. Hyponatremia 3. Acute kidney injury and stage III CKD 4. Chronic microcytic anemia 5. Type 2 diabetes mellitus, uncontrolled, with complications 6. Hypertension 7. Myasthenia gravis 8. Diabetic nephropathy   DISPOSITION AND FOLLOW-UP: [name] is to follow-up with the listed providers as detailed below, at which time, the following should be addressed:  1. Follow-up visits: 1. CHF - normal systolic function, grade 3 diastolic dysfunction; discharged on furosemide 80mg  TID, given explicit instructions to restrict sodium to 1500 mg daily 2. Diabetes/Nephropathy - urine protein to creatinine ratio is 2.56, discussed starting insulin with patient, he may be amenable now, also holding metformin 3. Hypertension - not on therapy currently, holding ACEi because of renal function  2. Labs and images needed: 1. BMET  3. Pending labs and tests needing follow-up:  NONE   Follow-up Information   Follow up with PRIBULA,CHRISTOPHER, MD On 09/09/2012. (PRIMARY CARE - Your appointment is on Friday, April 4th at 2:45.)    Contact information:   [address] [place] [address] [phone]      Discharge Orders   Future Appointments Provider Department Dept Phone   09/09/2012 2:45 PM Leodis Sias, MD [facility] [phone]   Future Orders Complete By Expires     Call MD for:  difficulty breathing, headache or visual disturbances  As directed     Call MD for:  extreme fatigue  As directed     Call MD for:  persistant dizziness or light-headedness  As directed     Call MD for:  persistant nausea and vomiting  As directed     Diet - low sodium heart healthy  As directed      Increase activity slowly  As directed         DISCHARGE MEDICATIONS:   Medication List    STOP taking these medications       benazepril 40 MG tablet  Commonly known as:  LOTENSIN     metFORMIN 500 MG tablet  Commonly known as:  GLUCOPHAGE     metolazone 5 MG tablet  Commonly known as:  ZAROXOLYN     torsemide 20 MG tablet  Commonly known as:  DEMADEX      TAKE these medications       furosemide 80 MG tablet  Commonly known as:  LASIX  Take 1 tablet (80 mg total) by mouth 3 (three) times daily.     magic mouthwash w/lidocaine Soln  Take 10 mLs by mouth 4 (four) times daily as needed (pain). Swish and spit.     MESTINON 60 MG tablet  Generic drug:  pyridostigmine  Take 60 mg by mouth 5 (five) times daily. Medication starts to wear off closer to 2.5 hours     mirtazapine 30 MG tablet  Commonly known as:  REMERON  Take 1 tablet (30 mg total) by mouth at bedtime.     mycophenolate 500 MG tablet  Commonly known as:  CELLCEPT  Take 500 mg by mouth 2 (two) times daily.     oxyCODONE 5 MG immediate release tablet  Commonly known as:  Oxy IR/ROXICODONE  Take  1 tablet (5 mg total) by mouth every 4 (four) hours as needed. For pain     pantoprazole 40 MG tablet  Commonly known as:  PROTONIX  Take 1 tablet (40 mg total) by mouth daily.     predniSONE 5 MG tablet  Commonly known as:  DELTASONE  Take 15 mg by mouth daily.     zolpidem 10 MG tablet  Commonly known as:  AMBIEN  Take 5 mg by mouth at bedtime.         CONSULTS:  NONE   PROCEDURES PERFORMED:  Dg Chest 2 View 09/05/2012  FINDINGS: Trachea is midline.  Heart size normal.  Mild bibasilar air space disease and small right pleural effusion persist.  Tiny left pleural effusion.  IMPRESSION: Tiny bilateral pleural effusions with mild right basilar airspace disease.  Dg Chest 2 View 09/03/2012   FINDINGS: Two views of the chest demonstrate increased densities at the right lung base and cannot exclude  a small right pleural effusion.  Median sternotomy wires are present. Heart size is normal.  Trachea is midline.   IMPRESSION: Hazy densities at the right lung base could represent a focus of infection, atelectasis or infarct (if there is concern for thromboembolic disease).  Probable small right pleural effusion.   Dg Chest 2 View 08/28/2012  FINDINGS: Cardiomediastinal silhouette is stable.  No acute infiltrate or pleural effusion.  No pulmonary edema.  Mild right basilar atelectasis. Status post median sternotomy.   IMPRESSION: No active disease.  Mild right basilar atelectasis.  Dg Chest 2 View 08/20/2012  FINDINGS: Artifact overlies the chest.  There has been previous median sternotomy.  Heart size is normal.  Mediastinal shadows are unremarkable.  The vascularity is normal.  Lungs are clear.  Small granuloma at the left base unchanged.  Chronic degenerative changes effect the spine.  IMPRESSION: No active disease.   Nm Pulmonary Perf And Vent 09/06/2012  FINDINGS:  Chest radiograph: Review of the preceding chest radiograph demonstrates trace bilateral pleural effusions and bibasilar heterogeneous air space opacities, right greater than left.  Mild cephalization of flow without frank evidence of edema.  Ventilation images:  As above, the patient refused the complete DTPA ventilation images, however anterior and posterior projection images were obtained and are negative for discrete area of non ventilation.  Ingested radiotracer is seen within the hypopharynx, mid-esophagus and upper abdomen.  Perfusion images:  There is relative homogeneous distribution of injected radiotracer throughout the bilateral pulmonary parenchyma. No discrete segmental or subsegmental filling defects.   IMPRESSION: Pulmonary embolism absent (very low probability for pulmonary embolism).  2-D echocardiogram 09/06/2012 LV: The cavity size was normal. Wall thickness was increased in a pattern of mild LVH. There was mild  focal basal hypertrophy of the septum, with an appearance suggesting concentric remodeling (increased wall thickness with normal wall mass). Systolic function was vigorous. The estimated ejection fraction was in the range of 65% to 70%. Although no diagnostic regional wall motion abnormality was identified, this possibility cannot be completely excluded on the basis of this study. Doppler parameters are consistent with a reversible restrictive pattern, indicative of decreased left ventricular diastolic compliance and/or increased left atrial pressure (grade 3 diastolic dysfunction). Doppler parameters are consistent with both elevated ventricular end-diastolic filling pressure and elevated left atrial filling pressure. AV: Cusp separation was reduced. Valve mobility was mildly restricted. MV: Moderately calcified annulus. Mildly calcified leaflets . Mild focal calcification and tethering of the anterior leaflet, with involvement of chords. LA: The atrium was  severely dilated. RV: The cavity size was mildly dilated. Wall thickness was normal. RA: The atrium was mildly dilated. PA: PA peak pressure: 59mm Hg (S).         ADMISSION DATA: H&P: 67yo male with PMH myasthenia gravis, congestive heart failure (grade 2 diastolic dysfunction from ECHO 2011), and a recent evaluation in the emergency department on 3/29 at which time he left AGAINST MEDICAL ADVICE where he was diagnosed with significant hyponatremia and a right lower lobe opacity which was thought to be infection vs. pulmonary embolism, returns to the ED today from his Pulmonologist's office with continued SOB and increased BLE. Today he was seen at his Pulmonologists, Dr. Lynelle Doctor office by the nurse practitioner. A CXR was performed which showed a persistent right lower lobe opacity. His renal function was noted to have worsened as well, and he was told that his increased edema would require further evaluation in the ED and likely admission to  the hospital.  The patient does endorse intermittent increased shortness of breath which is not positional or exertional but does worsen with increasing lower extremity edema. It is improved with standing. He denies fevers chills or cough. He was switched from furosemide to torsemide 60mg  po BID on 3/19, which was increased to 80mg  po BID on 3/26, but he feels like this has not improved his diureses.  He takes Mestinon for his Myasthenia Gravis, and states that if he does not get a dose exactly when he needs one, he begins "to panic" and cannot breathe. He states that he is going to keep his Mestinon on his person this hospitalization and no one is going to take it from him or he will leave AMA.  He is also demanding his medications for sleep, which consist of mirtazepine, Ambien, and oxycodone 5mg  all taken prior to bed. Again, he is threatening to leave AMA if he does not get these medications and states thathe has been taking them for 5 weeks without any adverse side effects.   Physical Exam: Vitals: Blood pressure 140/115, pulse 97, temperature 97.8 F (36.6 C), temperature source Oral, resp. rate 20, height 5\' 7"  (1.702 m), weight 187 lb (84.823 kg), SpO2 97.00%.  General: Alert, well-developed, somewhat uncooperative on evaluation but cooperative on exam.  Head: Normocephalic and atraumatic.  Eyes: Pupils equal, round, and reactive to light, no injection and anicteric.  Mouth: Pharynx pink and moist, no erythema or exudates.  Neck: Supple, full ROM, + JVD  Lungs: CTAB, normal respiratory effort, no accessory muscle use, no crackles, and no wheezes. Heart: Irregularly, irregular rate and rhythm  Abdomen: Soft, non-tender, non-distended.  Msk: No joint swelling, warmth, or erythema.  Extremities: 2+ radial pulses bilaterally. 3+ pitting edema of BLE up to knees.  Neurologic: Alert & oriented X3, cranial nerves II-XII intact, non-focal Psych: Normal mood and affect, but difficult at times when  he does not get his way.   Labs: Basic Metabolic Panel:    09/03/12 1655  09/05/12 1920  09/05/12 1941   NA  123*  121*  122*   K  4.6  4.7  4.7   CL  85*  82*  89*   CO2  21  26  --   GLUCOSE  283*  337*  335*   BUN  75*  73*  68*   CREATININE  1.63*  1.35  1.70*   CALCIUM  9.7  9.8  --    Liver Function Tests:   09/05/12 1920  AST  15   ALT  8   ALKPHOS  150*   BILITOT  0.4   PROT  6.8   ALBUMIN  3.5    CBC:   09/03/12 1655  09/05/12 1941  09/05/12 2021   WBC  9.2  --  11.2*   NEUTROABS  6.2  --  --   HGB  11.3*  11.9*  11.6*   HCT  30.8*  35.0*  31.6*   MCV  75.5*  --  74.9*   PLT  304  --  289    Cardiac Enzymes:   09/03/12 1735   TROPONINI  <0.30    BNP:   09/03/12 1656  09/05/12 1920   PROBNP  2552.0*  2519.0*    Coagulation:   09/05/12 1920   LABPROT  11.7   INR  0.86    Urinalysis:   09/03/12 1730   COLORURINE  YELLOW   LABSPEC  1.009   PHURINE  6.0   GLUCOSEU  NEGATIVE   HGBUR  SMALL*   BILIRUBINUR  NEGATIVE   KETONESUR  NEGATIVE   PROTEINUR  30*   UROBILINOGEN  0.2   NITRITE  NEGATIVE   LEUKOCYTESUR  NEGATIVE      HOSPITAL COURSE: 1.   Acute exacerbation of chronic diastolic CHF:  His last echocardiogram was in 2011, demonstrating normal systolic function with grade 2 diastolic dysfunction. The echocardiogram performed yesterday demonstrates vigorous systolic function with an EF of 65-70% but grade 3 diastolic dysfunction. Fluid balance is been difficult with this patient for some time. He consumes a tremendous amount of sodium at home despite our continued recommendations against this. He was recently transitioned to torsemide 80 mg twice a day and metolazone 5 mg daily. He came to the emergency department with significant dyspnea and orthopnea. ProBNP was 2500. He initially got 40 mg IV furosemide in the emergency department. We administered 80 mg IV yesterday morning and have now transitioned to PO 80 mg 3 times a day. He  diuresed well with this regimen. We gave him explicit instructions to restrict sodium to 1500 mg a day. He said that he would comply with this, and he would begin recording and monitoring his sodium intake. We did obtain urine protein to creatinine ratio which was quite elevated. Protein nephropathy is contributing as well. Discharged home on oral furosemide 80 mg 3 times a day.  2.   Hyponatremia:  This is been a problem for some time. His sodium this morning increased to 126 from 125 yesterday; this corrects to 128, accounting for glucose. Based on the exam, this is hypervolemic hyponatremia; CHF from his grade 3 diastolic dysfunction may be contributing but it seems nephropathy is the primary cause now. We will continue diuresing to address this problem.   3.   Acute kidney injury and stage III CKD:  Back in November and December of 2013 his creatinine was normal. It has been steadily in the 1.5-1.7 range this year. Given the findings from the echocardiogram yesterday and the elevated urine protein to creatinine ratio, I think progression of diabetic nephropathy is the primary cause; he is not yet in nephrotic range proteinuria but is spilling significant protein into his urine. His creatinine has down treated with diuresis, indicating a small component of cardiorenal syndrome. We will continue to diurese and follow his creatinine closely.  4.   Chronic microcytic anemia:  Baseline hemoglobin is around 12. It is currently 10.8. Reticulocyte count is 2.4% with an  index of 1.82, indicating low but near-normal reticulocyte production. Iron panel is normal.  5.   Type II diabetes mellitus, uncontrolled, with complications:  Patient has persistently refused to allow [place] to treat his diabetes. Despite trying to educate him over and over again, he does not want his sugars to fall below 300. He will leave AMA if we try to do this without his approval. His last hemoglobin A1c was 11.9 in February. We currently have  correctional scale insulin aspart ordered, but he has been refusing this. We are holding his home metformin because of renal function. We discussed the implications of continued noncompliance with glycemic control regimens including the possibility of dialysis. I think he is beginning to consider restarting an aggressive insulin strategy.   6.   Hypertension:  In addition to diuretics, he takes benazepril 40 mg daily. Over the past year his blood pressure has averaged about 150/85. His blood pressures have been running in a similar range here in the hospital. Verlon Au currently holding the ACE inhibitor in the setting of acute kidney injury.   7.   Myasthenia gravis:  Managed at [facility] for this. Stable on his home regimen which we have continued here.   8.   Diabetic Nephropathy:  As evidenced by elevated creatinine and urine protein to creatinine ratio of 2.56. The most likely etiology is diabetic nephropathy from this patient's long-standing, uncontrolled diabetes. He has been on an ACE inhibitor previously but we are holding it right now while we try to sort out his renal insufficiency.   DISCHARGE DATA: Vital Signs: BP 137/86  Pulse 88  Temp(Src) 97.7 F (36.5 C) (Oral)  Resp 18  Ht 5\' 7"  (1.702 m)  Wt 188 lb 3.2 oz (85.367 kg)  BMI 29.47 kg/m2  SpO2 100%  Labs: Results for orders placed during the hospital encounter of 09/05/12 (from the past 24 hour(s))  GLUCOSE, CAPILLARY     Status: Abnormal   Collection Time    09/06/12 11:18 AM      Result Value Range   Glucose-Capillary 315 (*) 70 - 99 mg/dL  TROPONIN I     Status: None   Collection Time    09/06/12 11:36 AM      Result Value Range   Troponin I <0.30  <0.30 ng/mL  GLUCOSE, CAPILLARY     Status: Abnormal   Collection Time    09/06/12  4:12 PM      Result Value Range   Glucose-Capillary 407 (*) 70 - 99 mg/dL   Comment 1 Notify RN    GLUCOSE, CAPILLARY     Status: Abnormal   Collection Time    09/06/12  8:26 PM      Result  Value Range   Glucose-Capillary 338 (*) 70 - 99 mg/dL   Comment 1 Documented in Chart     Comment 2 Notify RN    BASIC METABOLIC PANEL     Status: Abnormal   Collection Time    09/07/12  5:55 AM      Result Value Range   Sodium 126 (*) 135 - 145 mEq/L   Potassium 4.3  3.5 - 5.1 mEq/L   Chloride 87 (*) 96 - 112 mEq/L   CO2 29  19 - 32 mEq/L   Glucose, Bld 236 (*) 70 - 99 mg/dL   BUN 67 (*) 6 - 23 mg/dL   Creatinine, Ser 1.61 (*) 0.50 - 1.35 mg/dL   Calcium 9.7  8.4 - 09.6 mg/dL  GFR calc non Af Amer 50 (*) >90 mL/min   GFR calc Af Amer 58 (*) >90 mL/min  IRON AND TIBC     Status: None   Collection Time    09/07/12  5:55 AM      Result Value Range   Iron 125  42 - 135 ug/dL   TIBC 841  324 - 401 ug/dL   Saturation Ratios 39  20 - 55 %   UIBC 199  125 - 400 ug/dL  RETICULOCYTES     Status: Abnormal   Collection Time    09/07/12  5:55 AM      Result Value Range   Retic Ct Pct 2.4  0.4 - 3.1 %   RBC. 3.93 (*) 4.22 - 5.81 MIL/uL   Retic Count, Manual 94.3  19.0 - 186.0 K/uL     Time spent on discharge: 42 minutes   Services Ordered on Discharge: 1. PT - no 2. OT - no 3. RN - no 4. Other - no   Signed by:  Dorthula Rue. Earlene Plater, MD PGY-I, Internal Medicine  09/07/2012, 11:14 AM

## 2012-09-09 ENCOUNTER — Ambulatory Visit (INDEPENDENT_AMBULATORY_CARE_PROVIDER_SITE_OTHER): Payer: Medicare Other | Admitting: Internal Medicine

## 2012-09-09 ENCOUNTER — Other Ambulatory Visit: Payer: Self-pay | Admitting: Internal Medicine

## 2012-09-09 ENCOUNTER — Encounter: Payer: Self-pay | Admitting: Internal Medicine

## 2012-09-09 VITALS — BP 158/78 | HR 80 | Temp 97.7°F | Wt 189.9 lb

## 2012-09-09 DIAGNOSIS — G7000 Myasthenia gravis without (acute) exacerbation: Secondary | ICD-10-CM

## 2012-09-09 DIAGNOSIS — I5033 Acute on chronic diastolic (congestive) heart failure: Secondary | ICD-10-CM

## 2012-09-09 DIAGNOSIS — I10 Essential (primary) hypertension: Secondary | ICD-10-CM

## 2012-09-09 DIAGNOSIS — IMO0001 Reserved for inherently not codable concepts without codable children: Secondary | ICD-10-CM

## 2012-09-09 DIAGNOSIS — N179 Acute kidney failure, unspecified: Secondary | ICD-10-CM

## 2012-09-09 DIAGNOSIS — E1165 Type 2 diabetes mellitus with hyperglycemia: Secondary | ICD-10-CM

## 2012-09-09 LAB — BASIC METABOLIC PANEL
Chloride: 89 mEq/L — ABNORMAL LOW (ref 96–112)
Creat: 1.5 mg/dL — ABNORMAL HIGH (ref 0.50–1.35)
Potassium: 4.9 mEq/L (ref 3.5–5.3)

## 2012-09-09 MED ORDER — PEN NEEDLES 31G X 6 MM MISC: 1.0000 | Freq: Every day | Status: DC

## 2012-09-09 MED ORDER — GLUCOSE BLOOD VI STRP: ORAL_STRIP | Status: DC

## 2012-09-09 MED ORDER — INSULIN ASPART 100 UNIT/ML ~~LOC~~ SOLN: SUBCUTANEOUS | Status: DC

## 2012-09-09 MED ORDER — FUROSEMIDE 80 MG PO TABS: 80.0000 mg | ORAL_TABLET | Freq: Four times a day (QID) | ORAL | Status: DC

## 2012-09-09 MED ORDER — FREESTYLE SYSTEM KIT: PACK | Status: DC

## 2012-09-09 MED ORDER — BENAZEPRIL HCL 20 MG PO TABS: 20.0000 mg | ORAL_TABLET | Freq: Every day | ORAL | Status: DC

## 2012-09-09 MED ORDER — INSULIN GLARGINE 100 UNIT/ML ~~LOC~~ SOLN: 20.0000 [IU] | Freq: Every day | SUBCUTANEOUS | Status: DC

## 2012-09-09 NOTE — Progress Notes (Signed)
Results of labs for Dr. Clarisa Kindred at [facility] faxed to [facility] by Levy Sjogren on 09/09/12 at 4:15pm. Therisa Doyne at [facility] to confirm that she received results at 4:25pm.  Results were received.  Levy Sjogren 09/09/12 4:30pm

## 2012-09-09 NOTE — Patient Instructions (Signed)
1.  Restart the Benazepril 20 mg tablets.  Take 1 tablet daily for the blood pressure and to protect the kidneys.    2.  Start Lantus 20 Units once daily for the sugars  3.  Start Novolog 5 units with each meal.    4.  Check your blood sugars every morning as well as before meals.  Alternate before lunch, supper, and bed every 3 days.    5.  Follow up in 3 weeks.

## 2012-09-09 NOTE — Progress Notes (Signed)
Subjective:   Patient ID: [name] male   DOB: 09-22-1945 67 y.o.   MRN: [id number]  HPI: [name] is a 67 y.o. man who presents to clinic today for follow up from his recent hospitalization.  He states that he feels better then when he went into the hospital but that the swelling in his legs started again due to "dietay indiscretions."  See Problem focused Assessment and Plan for full details of his chronic medical conditions including myasthenia gravis, hypertension, diabetes, chronic diastolic heart failure, and CKD stage III.    Past Medical History  Diagnosis Date  . Sinus tachycardia 2006    a. Nonobstructive CAD by cath 2013. b. s/p atrial tachycardia ablation 08/2011 (sinus node modification)  . Orthostatic hypotension   . Unspecified essential hypertension   . Hyperlipidemia   . Cervical spondylosis with radiculopathy   . Syrinx     T5-T6  . Cord compression     Cord compression syndrome C5-C6, C6-C7  . ADD (attention deficit disorder)   . Decreased libido   . Depression   . Diabetes mellitus type 2, uncontrolled, with complications     CAD, orthostatic hypotension, erectile dysfunction  . Pulmonary nodule, left     Evaluated 04/2011 by pulmonology - felt to be benign granuloma  . Peripheral edema   . Coronary artery disease, non-occlusive     Mild nonobstructive CAD by cath 08/2011  . Systolic congestive heart failure     By echo 2013 at [facility] - reported EF 45% per pt  . Colitis     with surface exudate  . Hemorrhoids     small  . Gastritis   . Clostridium difficile infection 02/15/2012    causing pseudomembranosu colitis  . Diastolic heart failure     grade 2 per echocardiogram (2011)  . Right bundle branch block and left posterior fascicular block   . Sleep apnea     occassionally uses cpap  . 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 Alum & Mag  Hydroxide-Simeth (MAGIC MOUTHWASH W/LIDOCAINE) SOLN Take 10 mLs by mouth 4 (four) times daily as needed (pain). Swish and spit.  250 mL  0  . furosemide (LASIX) 80 MG tablet Take 1 tablet (80 mg total) by mouth 3 (three) times daily.  90 tablet  0  . mirtazapine (REMERON) 30 MG tablet Take 1 tablet (30 mg total) by mouth at bedtime.  30 tablet  1  . mycophenolate (CELLCEPT) 500 MG tablet Take 500 mg by mouth 2 (two) times daily.      Marland Kitchen oxyCODONE (OXY IR/ROXICODONE) 5 MG immediate release tablet Take 1 tablet (5 mg total) by mouth every 4 (four) hours as needed. For pain  120 tablet  0  . pantoprazole (PROTONIX) 40 MG tablet Take 1 tablet (40 mg total) by mouth daily.  30 tablet  6  . predniSONE (DELTASONE) 5 MG tablet Take 15 mg by mouth daily.      Marland Kitchen pyridostigmine (MESTINON) 60 MG tablet Take 60 mg by mouth 5 (five) times daily. Medication starts to wear off closer to 2.5 hours      . zolpidem (AMBIEN) 10 MG tablet Take 5 mg by mouth at bedtime.      . [DISCONTINUED] pantoprazole (PROTONIX) 40 MG tablet Take 1 tablet (40 mg total) by mouth daily.  30 tablet  2  No current facility-administered medications for this visit.   Family History  Problem Relation Age of Onset  . Breast cancer Mother   . Other Father     Beck's Sarcoid  . Colon cancer Neg Hx   . Prostate cancer Neg Hx    History   Social History  . Marital Status: Single    Spouse Name: N/A    Number of Children: 1  . Years of Education: N/A   Occupational History  . Retired     Surveyor, minerals   Social History Main Topics  . Smoking status: Former Smoker -- 2.50 packs/day for 30 years    Quit date: 06/09/1991  . Smokeless tobacco: Never Used  . Alcohol Use: No  . Drug Use: No  . Sexually Active: Not on file   Other Topics Concern  . Not on file   Social History Narrative   Has a single man's lifestyle and will remind anyone of it when asked.   Prefers to see only male physicians   Retired Surveyor, minerals   Review of  Systems: A full 12 system ROS is negative except as noted in the HPI and A&P.   Objective:  Physical Exam: Filed Vitals:   09/09/12 1450  BP: 158/78  Pulse: 80  Temp: 97.7 F (36.5 C)  TempSrc: Oral  Weight: 189 lb 14.4 oz (86.138 kg)  SpO2: 100%   Constitutional: Vital signs reviewed.  Patient is a well-developed and well-nourished elderly appearing man in no acute distress and cooperative with exam. Alert and oriented x3.  Head: Normocephalic and atraumatic Ear: TM normal bilaterally Mouth: no erythema or exudates, MMM Eyes: PERRL, EOMI, conjunctivae normal, No scleral icterus.  Neck: Supple, Trachea midline normal ROM, No JVD, mass, thyromegaly, or carotid bruit present.  Cardiovascular: RRR with occasional ectopic beats, S1 normal, S2 normal, no MRG, pulses symmetric and intact bilaterally Pulmonary/Chest: CTAB, no wheezes, rales, or rhonchi Abdominal: Soft. Mild periumbilical tenderness, non-distended, bowel sounds are normal, no masses, organomegaly, or guarding present.  GU: no CVA tenderness Musculoskeletal: No joint deformities, erythema, or stiffness, ROM full and no nontender Hematology: no cervical, inginal, or axillary adenopathy.  Neurological: A&O x3, Strength is normal and symmetric bilaterally, cranial nerve II-XII are grossly intact, no focal motor deficit, sensory intact to light touch bilaterally.  Skin: 3+ pitting edema to the knees bilaterally.  Warm, dry and intact. No rash, cyanosis, or clubbing.  Psychiatric: anxious mood and labile affect. speech is goal directed.  Behavior is anxious.  Judgment and insight appear poor.  Thought content normal. Cognition is intact and memory are normal.    Assessment & Plan:

## 2012-09-12 ENCOUNTER — Other Ambulatory Visit: Payer: Self-pay | Admitting: *Deleted

## 2012-09-12 ENCOUNTER — Encounter: Payer: Self-pay | Admitting: Internal Medicine

## 2012-09-12 DIAGNOSIS — M5412 Radiculopathy, cervical region: Secondary | ICD-10-CM

## 2012-09-12 DIAGNOSIS — R269 Unspecified abnormalities of gait and mobility: Secondary | ICD-10-CM | POA: Insufficient documentation

## 2012-09-12 NOTE — Telephone Encounter (Signed)
Pt called stating the medicine you gave him is not pulling off water, he is still swollen.   He wants to know if you want him to take the "kicker pill"? It's been several days with no improvement in swelling.  He is watching diet closely. He is taking water pill 4 times a day, (lasix 80 mg 4 times a day) do you want to increase?  Pt # [id number] He would like to talk with you.

## 2012-09-13 ENCOUNTER — Telehealth: Payer: Self-pay | Admitting: *Deleted

## 2012-09-13 MED ORDER — OXYCODONE HCL 5 MG PO TABS: 5.0000 mg | ORAL_TABLET | ORAL | Status: AC | PRN

## 2012-09-13 MED ORDER — OXYCODONE HCL 5 MG PO TABS: 5.0000 mg | ORAL_TABLET | ORAL | Status: DC | PRN

## 2012-09-13 NOTE — Telephone Encounter (Signed)
Pt called again today stating swelling has not gone down at all, he continues to swell.  He is asking for the "kicker pill". He is not c/o SOB.  Also wants refill on oxycodone and PA for Ambien.   I talked with Purnell Shoemaker and his insurance will only pay for # 90 of Ambien a year so may need to change to something else.

## 2012-09-13 NOTE — Telephone Encounter (Signed)
Called and spoke with [name] and discussed his swelling.  He states that his weight has been going up for the last few days from 188 to 190 and 192.  He has been having problems sleeping but is still able to lay down at night on only 1 pillow.  He denies dizziness on standing, cramping, or chest pain.  He has not been wearing his compression stockings.   Plan: -Increase Lasix to 120 mg QID until Thursday - Appointment either Thursday to assess fluid status. - Will need a Bmet at that appointment. - If continued swelling and Creatinine okay, add metolazone. - Ensure that he has picked up his compression stockings.  Leodis Sias, MD Internal Medicine Resident, PGY III Co-Chief Resident, Internal Medicine Pager: [phone] 09/13/2012 10:19 AM

## 2012-09-13 NOTE — Telephone Encounter (Signed)
Pt calls and states he has some dizziness and cramping of the arms since starting new dose of furosemide, "not anything bad but just let christopher know"

## 2012-09-13 NOTE — Telephone Encounter (Signed)
Pt informed Rx is ready 

## 2012-09-13 NOTE — Telephone Encounter (Signed)
Received faxed PA request for pt's zolpidem 10 mg.  Contacted pt's insurance at [phone] to initiate prior authorization, phone given to pt's pcp to complete request over the phone.   Request completed over the phone and approved for until end of year 05/2013.  Contacted pharmacy to make them aware but request was still being denied.  Pharmacist had me call in "new" rx and he was going to give it a little time and resubmit.Danny Spittle Cassady4/8/201411:57 AM

## 2012-09-13 NOTE — Telephone Encounter (Signed)
Three refills of his oxycodone done and given to Triage nurse.

## 2012-09-15 ENCOUNTER — Ambulatory Visit (INDEPENDENT_AMBULATORY_CARE_PROVIDER_SITE_OTHER): Payer: Medicare Other | Admitting: Internal Medicine

## 2012-09-15 ENCOUNTER — Encounter: Payer: Self-pay | Admitting: Internal Medicine

## 2012-09-15 ENCOUNTER — Other Ambulatory Visit: Payer: Medicare Other

## 2012-09-15 ENCOUNTER — Ambulatory Visit: Payer: Medicare Other | Admitting: Internal Medicine

## 2012-09-15 VITALS — BP 137/76 | HR 74 | Temp 97.5°F | Ht 64.0 in | Wt 194.3 lb

## 2012-09-15 DIAGNOSIS — I5033 Acute on chronic diastolic (congestive) heart failure: Secondary | ICD-10-CM

## 2012-09-15 DIAGNOSIS — K2970 Gastritis, unspecified, without bleeding: Secondary | ICD-10-CM | POA: Insufficient documentation

## 2012-09-15 DIAGNOSIS — E871 Hypo-osmolality and hyponatremia: Secondary | ICD-10-CM

## 2012-09-15 DIAGNOSIS — E1165 Type 2 diabetes mellitus with hyperglycemia: Secondary | ICD-10-CM

## 2012-09-15 LAB — BASIC METABOLIC PANEL
BUN: 63 mg/dL — ABNORMAL HIGH (ref 6–23)
Chloride: 84 mEq/L — ABNORMAL LOW (ref 96–112)
Creat: 1.42 mg/dL — ABNORMAL HIGH (ref 0.50–1.35)
Glucose, Bld: 365 mg/dL — ABNORMAL HIGH (ref 70–99)
Potassium: 4.6 mEq/L (ref 3.5–5.3)

## 2012-09-15 MED ORDER — METOLAZONE 5 MG PO TABS: 5.0000 mg | ORAL_TABLET | Freq: Every day | ORAL | Status: DC

## 2012-09-15 NOTE — Assessment & Plan Note (Signed)
He continues to struggle with sleep which will also interact with his depression as well.  We will start Remeron 15 mg with 5 mg of ambien as back up if the remeron doesn't work.  We will plan on seeing him back in 2-4 weeks to see how he is doing.

## 2012-09-15 NOTE — Assessment & Plan Note (Signed)
BP Readings from Last 5 Encounters:  02/24/12 141/84  02/10/12 150/78  01/08/12 150/78  12/25/11 150/97  12/16/11 148/66   BP today is mildly improved from previously but still mildly above his goal.  We talked about watching very closely the salt he has in his diet.

## 2012-09-15 NOTE — Patient Instructions (Signed)
1.  Start Metolazone 5 mg tablets. Take 1 tablet daily  2.  Continue your Lasix 120 mg 4 times per day  3.  Continue watching your weight daily and your water and salt intake as well.   4.  If your breathing gets worse you need to be seen in the ED.  5.  Follow up with Dr. Lavena Bullion on 4/15.  Please be here at 8 am to get your labs drawn.

## 2012-09-15 NOTE — Progress Notes (Signed)
Subjective:   Patient ID: [name] male   DOB: Jul 10, 1945 67 y.o.   MRN: [id number]  HPI: [name] is a 67 y.o. man who presents to clinic today complaining of swelling in his legs along with orthopnea for the last week.  We had discussed this over the phone on Monday after increasing his Lasix to 80 mg QID at his 09/09/12 appointment.  He states that at that time he was having increase in his weight and the swelling in his legs is getting worse.  We increased his Lasix to 120 mg QID over the phone and he returns today for follow up after that dosing change.  He has continued to gain weight and states that for the last 3 nights he has been having a hard time breathing when he lies flat and some mild problems breathing when he leans forward.  His legs have continued to swell.  He states that he has been meticulous about his diet to keep his sodium intake down to less then 2000 mg daily.  He states that he has also cut back on his water intake and is only drinking around 32 oz per day.  He is very worried about his kidney function as well as his sodium with the change in his lasix.    Greater then 50 minutes spent today in direct coordination of care and face to face counseling with [name].    Past Medical History  Diagnosis Date  . Sinus tachycardia 2006    a. Nonobstructive CAD by cath 2013. b. s/p atrial tachycardia ablation 08/2011 (sinus node modification)  . Orthostatic hypotension   . Unspecified essential hypertension   . Hyperlipidemia   . Cervical spondylosis with radiculopathy   . Syrinx     T5-T6  . Cord compression     Cord compression syndrome C5-C6, C6-C7  . ADD (attention deficit disorder)   . Decreased libido   . Depression   . Diabetes mellitus type 2, uncontrolled, with complications     CAD, orthostatic hypotension, erectile dysfunction  . Pulmonary nodule, left     Evaluated 04/2011 by pulmonology - felt to be benign granuloma  . Peripheral edema   .  Coronary artery disease, non-occlusive     Mild nonobstructive CAD by cath 08/2011  . Systolic congestive heart failure     By echo 2013 at [facility] - reported EF 45% per pt  . Colitis     with surface exudate  . Hemorrhoids     small  . Gastritis   . Clostridium difficile infection 02/15/2012    causing pseudomembranosu colitis  . Diastolic heart failure     grade 2 per echocardiogram (2011)  . Right bundle branch block and left posterior fascicular block   . Sleep apnea     occassionally uses cpap  . 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 Alum & Mag Hydroxide-Simeth (MAGIC MOUTHWASH W/LIDOCAINE) SOLN Take 10 mLs by mouth 4 (four) times daily as needed (pain). Swish and spit.  250 mL  0  . benazepril (LOTENSIN) 20 MG tablet Take 1 tablet (20 mg total) by mouth daily before breakfast.  30 tablet  3  . Blood Glucose Monitoring Suppl (FREESTYLE FREEDOM) KIT 1 each by Does not apply route as needed.      . furosemide (LASIX) 80 MG tablet Take 1 tablet (80 mg  total) by mouth 4 (four) times daily.  120 tablet  1  . Glucose Blood (FREESTYLE LITE TEST VI) 1 each 4 (four) times daily.      . insulin aspart (NOVOLOG) 100 UNIT/ML injection Take 5 units with each meal three times daily.  15 vial  12  . insulin glargine (LANTUS SOLOSTAR) 100 UNIT/ML injection Inject 0.2 mLs (20 Units total) into the skin at bedtime.  15 mL  12  . Insulin Pen Needle (PEN NEEDLES) 31G X 6 MM MISC Inject 1 each into the skin daily.  100 each  6  . mirtazapine (REMERON) 30 MG tablet Take 1 tablet (30 mg total) by mouth at bedtime.  30 tablet  1  . mycophenolate (CELLCEPT) 500 MG tablet Take 500 mg by mouth 2 (two) times daily.      Melene Muller ON 11/11/2012] oxyCODONE (OXY IR/ROXICODONE) 5 MG immediate release tablet Take 1 tablet (5 mg total) by mouth every 4 (four) hours as needed. For pain  120 tablet  0  . pantoprazole  (PROTONIX) 40 MG tablet Take 1 tablet (40 mg total) by mouth daily.  30 tablet  6  . predniSONE (DELTASONE) 5 MG tablet Take 15 mg by mouth daily.      Marland Kitchen pyridostigmine (MESTINON) 60 MG tablet Take 60 mg by mouth 5 (five) times daily. Medication starts to wear off closer to 2.5 hours      . zolpidem (AMBIEN) 10 MG tablet Take 5 mg by mouth at bedtime.      . [DISCONTINUED] pantoprazole (PROTONIX) 40 MG tablet Take 1 tablet (40 mg total) by mouth daily.  30 tablet  2   No current facility-administered medications for this visit.   Family History  Problem Relation Age of Onset  . Breast cancer Mother   . Other Father     Beck's Sarcoid  . Colon cancer Neg Hx   . Prostate cancer Neg Hx    History   Social History  . Marital Status: Single    Spouse Name: N/A    Number of Children: 1  . Years of Education: N/A   Occupational History  . Retired     Surveyor, minerals   Social History Main Topics  . Smoking status: Former Smoker -- 2.50 packs/day for 30 years    Quit date: 06/09/1991  . Smokeless tobacco: Never Used  . Alcohol Use: No  . Drug Use: No  . Sexually Active: Not on file   Other Topics Concern  . Not on file   Social History Narrative   Has a single man's lifestyle and will remind anyone of it when asked.   Prefers to see only male physicians   Retired Surveyor, minerals   Review of Systems: Constitutional: Positive for fatigue.  Denies fever, chills, diaphoresis, appetite change.  HEENT: Denies photophobia, eye pain, redness, hearing loss, ear pain, congestion, sore throat, rhinorrhea, sneezing, mouth sores, trouble swallowing, neck pain, neck stiffness and tinnitus.   Respiratory: Positive for SOB, DOE.  Denies cough, chest tightness,  and wheezing.   Cardiovascular: Positive for leg swelling. Denies chest pain, palpitations.   Gastrointestinal: Denies nausea, vomiting, abdominal pain, diarrhea, constipation, blood in stool and abdominal distention.  Genitourinary: Denies  dysuria, urgency, frequency, hematuria, flank pain and difficulty urinating.  Musculoskeletal: Denies myalgias, back pain, joint swelling, arthralgias and gait problem.  Skin: Denies pallor, rash and wound.  Neurological: Positive for dizziness. Denies seizures, syncope, weakness, light-headedness, numbness and headaches.  Hematological: Denies adenopathy. Easy  bruising, personal or family bleeding history  Psychiatric/Behavioral: Positive for agitation and nervousness.  Denies suicidal ideation, mood changes, confusion, nervousness, sleep disturbance and agitation  Objective:  Physical Exam: Filed Vitals:   09/15/12 1003  BP: 140/69  Pulse: 88  Temp: 97.5 F (36.4 C)  TempSrc: Oral  Height: 5\' 4"  (1.626 m)  Weight: 194 lb 4.8 oz (88.134 kg)  SpO2: 98%   Constitutional: Vital signs reviewed.  Patient is an anxious appearing elderly man in mild distress and cooperative with exam. Alert and oriented x3.  Head: Normocephalic and atraumatic Ear: TM normal bilaterally Mouth: no erythema or exudates, MMM Eyes: PERRL, EOMI, conjunctivae normal, No scleral icterus.  Neck: Supple, Trachea midline normal ROM, JVD 9 cm, mass, thyromegaly, or carotid bruit present.  Cardiovascular: RRR with occasional ectopic beats, S1 normal, S2 normal, no MRG, pulses symmetric and intact bilaterally Pulmonary/Chest: mild bibasilar crackles noted.   no wheezes, or rhonchi Abdominal: Soft. Non-tender, non-distended, bowel sounds are normal, no masses, organomegaly, or guarding present.  Musculoskeletal: No joint deformities, erythema, or stiffness, ROM full and no nontender Hematology: no cervical, inginal, or axillary adenopathy.  Neurological: A&O x3, Strength is normal and symmetric bilaterally, cranial nerve II-XII are grossly intact, no focal motor deficit, sensory intact to light touch bilaterally.  Skin: 3+ pitting edema to the knee with 2+ pitting edema up the IT band bilaterally to the hips. No presacral  edema.  Skin changes consistent with chronic venous stasis bilaterally to the knee with multiple excoriation marks.  No open wounds noted.  Psychiatric: anxious mood and labile affect. speech and behavior is normal. Judgment and insight are intact.  Thought content normal. Cognition and memory are normal.   Assessment & Plan:

## 2012-09-15 NOTE — Assessment & Plan Note (Signed)
He still has not started Cellcept and is reluctant to because of the fact he already has stomach problems and when he read the literature with the medication it was the number 1 side effect.  I encouraged him today to get started on the medication to help stop the progression of his weakness.  He continues to follow with Dr. Dimas Aguas and if he does start the medication we would help by drawing the labs that Dr. Dimas Aguas has requested.

## 2012-09-15 NOTE — Assessment & Plan Note (Signed)
Weight is stable today so we will continue on his current course of medications.  He states that he has been doing a low salt diet, no more then 2000 mg of salt per day.  He has questionable compliance but today is doing rather well from a symptom standpoint.

## 2012-09-15 NOTE — Assessment & Plan Note (Signed)
Refill of his medications today.  Pain is well controlled on his current regimen.

## 2012-09-15 NOTE — Assessment & Plan Note (Signed)
Hemoglobin A1C (%)  Date Value  10/30/2011 14.0   01/29/2011 13.9*  04/28/2010 10.3   10/29/2009 10.4    His last a1c in May was 14 and he refuses to let me check it today stating "I'm still on the prednisone and I know its going to be high." He continues to refuse to use his medication to help control his blood sugar for fear that it will "drop him too low."  When asked how low is too low he states "between 275 and 300 is too low for me."  We discussed that continued high blood sugar runs the risk of damaging his kidneys, eyes, and are likely contributing to his weight loss that he has noted in the last few months.  He continues to refuse treatment.

## 2012-09-15 NOTE — Assessment & Plan Note (Signed)
We discussed today how dangerous it was to take his Remus Loffler like this and I feel this is the cause for his confusion today.  We will refill his medication today but remind him that he is only to take it once before bedtime not twice per day.  If he continues to abuse this we will likely have to switch him to something different to avoid harm.

## 2012-09-15 NOTE — Assessment & Plan Note (Signed)
He continues to be non-compliance with his diabetes medications.  He states that he is taking his lantus but his blood sugar to day is elevated and he refused to let me check his A1C today.  We discussed taking his insulin EVERY day not just when he tests and sees his blood sugar above 400.  This is likely contributing to his fatigue and weight loss.

## 2012-09-15 NOTE — Assessment & Plan Note (Signed)
He is clinically depressed today, likely secondary to his chronic health problems.  He states that he feels overwhelmed by everything and that he feels bad that he can't do everything he used to do.  We discussed this today and the treatment of depression.  He refuses to accept counseling as a part of his treatment and is very hesitant about SSRIs.  We discussed using Remeron for sleep, depression, as well as appetite stimulant.  He is willing to give this a try.

## 2012-09-15 NOTE — Assessment & Plan Note (Signed)
Lab Results  Component Value Date   HGBA1C 11.9 07/12/2012   HGBA1C 9.9* 03/17/2012   HGBA1C 14.0 10/30/2011     Assessment: Diabetes control: poor control (HgbA1C >9%) Progress toward A1C goal:  unchanged Comments: CBG and Bmet show markedly elevated blood sugar today that is around his baseline.  He has not started his insulin because he states that his blood sugar has been "Low" at home, around 230-240.  Discussed again how he needs better control to fend off worsening kidney function.   Plan: Medications:  Encouraged to start his Lantus and novolog with meals.  Home glucose monitoring: Frequency: 2 times a day Timing: before meals Instruction/counseling given: reminded to bring blood glucose meter & log to each visit and discussed diet Educational resources provided:   Self management tools provided:   Other plans: He continues to be non-compliant because of a persistent belief that his body does not function with a CBG below 230-250.  We discussed the risks including worsening kidney function and eventual dialysis if he continues to not control his blood sugar.

## 2012-09-15 NOTE — Assessment & Plan Note (Signed)
He is due for a refill of his pain medication today and we will provide this.  If he continues to need this medication (as opposed to overusing NSAIDs) we will work on getting up set up with a pain contract.

## 2012-09-15 NOTE — Assessment & Plan Note (Signed)
Basic Metabolic Panel:    Component Value Date/Time   NA 122* 09/15/2012 0913   K 4.6 09/15/2012 0913   CL 84* 09/15/2012 0913   CO2 28 09/15/2012 0913   BUN 63* 09/15/2012 0913   CREATININE 1.42* 09/15/2012 0913   CREATININE 1.41* 09/07/2012 0555   GLUCOSE 365* 09/15/2012 0913   CALCIUM 9.4 09/15/2012 0913   With the increase in his lasix his creatinine actually improved which makes me think that this bump in his creatinine, while very likely a sign of progressive renal damage from uncontrolled diabetes may also be from progressive cardiorenal syndrome as well.  We will work to try to diurese him more and see if that continues to improve.  He is cautioned to avoid NSAIDs to limit damage to his kidneys as well as to start his insulin therapy to control his blood sugars.

## 2012-09-15 NOTE — Assessment & Plan Note (Signed)
BP Readings from Last 5 Encounters:  06/06/12 165/82  05/24/12 159/84  05/20/12 148/93  05/18/12 152/70  05/18/12 173/80   BP today is elevated but similar to previous.  This is likely due to medication non-adherence.  We discussed taking his medications as prescribed and to continue with his low salt diet.

## 2012-09-15 NOTE — Assessment & Plan Note (Signed)
He has been overusing ibuprofen which is likely contributing to his abdominal pain.  He has no worrisome signs for upper GI bleed but he needs to stop the medication.  He is reluctant because he is having problems with back pain, neck pain, and hip pain as well as the stomach pain. We will start a low dose of oxycodone without tylenol to replace the ibuprofen while we work this up.  He has a follow up with Dr. Leone Payor and we will continue to monitor his symptoms.

## 2012-09-15 NOTE — Assessment & Plan Note (Signed)
Basic Metabolic Panel:    Component Value Date/Time   NA 122* 09/15/2012 0913   K 4.6 09/15/2012 0913   CL 84* 09/15/2012 0913   CO2 28 09/15/2012 0913   BUN 63* 09/15/2012 0913   CREATININE 1.42* 09/15/2012 0913   CREATININE 1.41* 09/07/2012 0555   GLUCOSE 365* 09/15/2012 0913   CALCIUM 9.4 09/15/2012 0913   Sodium is worse today then previously, likely secondary to excessive water intake and worsening heart failure.  Initial plans were to admit him today but he is adamant that he will not be admitted today.  Risks of not being admitted and treated for his CHF exacerbation and hyponatremia were discussed at length and he continued to refuse.  We will add Metolazone to augment his diuresis but I am greatly concerned that his sodium and chloride will get worse with this medication.  We will need to watch very closely and will see him back on Tuesday next week.  He is instructed that if he feels worse he should be seen in the ED and should be expected to be admitted at that time.

## 2012-09-15 NOTE — Assessment & Plan Note (Signed)
Glucose-Capillary (mg/dL)  Date Value  02/06/4781 224*   CBG still low today and he states that he feels "woozy" which he states is because his blood sugar is too low.  We discussed that he has several reasons to feel woozy and that he needs to control his blood sugars better but he refuses.  He has not been taking his lantus at home because of feel that it will drop him "too low."  We discussed the long term benefits from glucose control and the risks of continued hyperglycemia including kidney damage, stroke, heart attacks, and blindness.

## 2012-09-15 NOTE — Assessment & Plan Note (Signed)
He is in an acute exacerbation of his heart failure today and needs to be diuresed.  With his persistent hyponatremia, hypochloremia, and likely cardiorenal syndrome I wanted to admit him today.  He was ADAMANT that he not be admitted today and that we try to control this with outpatient therapy.  I discussed that his best interest would be to be admitted and diuresed with IV lasix and consultation by the heart failure team for considerations for advance therapies.  Last EF was fine but he has gone from grade 2 diastolic failure to grade 3 diastolic failure that was seen during his last admission.  We discussed the risks of not being admitted for treatment today including worsening breathing and swelling as well as need for longer hospitalization if he does not improve and death.  He continued to refuse admission.    Since he continued to refuse admission we will add metolazone to augment his Lasix 120 mg QID.  I will also refer him to the social worker, Lynnae January, for referral to [facility] Heart Failure Monitoring program.  He is encouraged to weigh himself daily, take his medications, maintain his dietary restriction of sodium as well as to restrict his fluids to <1500 ml per day.  He will follow up on Tuesday and will need a STAT Bmet at that time to follow his sodium, creatinine, potassium, and Chloride.

## 2012-09-16 ENCOUNTER — Telehealth: Payer: Self-pay | Admitting: Licensed Clinical Social Worker

## 2012-09-16 ENCOUNTER — Encounter (HOSPITAL_COMMUNITY): Payer: Self-pay | Admitting: Cardiology

## 2012-09-16 ENCOUNTER — Inpatient Hospital Stay (HOSPITAL_COMMUNITY)
Admission: EM | Admit: 2012-09-16 | Discharge: 2012-09-24 | DRG: 291 | Disposition: A | Discharge status: Home / Self Care | Payer: Medicare Other | Attending: Internal Medicine | Admitting: Internal Medicine

## 2012-09-16 ENCOUNTER — Emergency Department (HOSPITAL_COMMUNITY): Payer: Medicare Other

## 2012-09-16 ENCOUNTER — Encounter: Payer: Self-pay | Admitting: Licensed Clinical Social Worker

## 2012-09-16 ENCOUNTER — Other Ambulatory Visit: Payer: Self-pay

## 2012-09-16 DIAGNOSIS — I1310 Hypertensive heart and chronic kidney disease without heart failure, with stage 1 through stage 4 chronic kidney disease, or unspecified chronic kidney disease: Secondary | ICD-10-CM

## 2012-09-16 DIAGNOSIS — E871 Hypo-osmolality and hyponatremia: Secondary | ICD-10-CM

## 2012-09-16 DIAGNOSIS — R609 Edema, unspecified: Secondary | ICD-10-CM

## 2012-09-16 DIAGNOSIS — Z79899 Other long term (current) drug therapy: Secondary | ICD-10-CM

## 2012-09-16 DIAGNOSIS — Z91199 Patient's noncompliance with other medical treatment and regimen due to unspecified reason: Secondary | ICD-10-CM

## 2012-09-16 DIAGNOSIS — I130 Hypertensive heart and chronic kidney disease with heart failure and stage 1 through stage 4 chronic kidney disease, or unspecified chronic kidney disease: Principal | ICD-10-CM | POA: Diagnosis present

## 2012-09-16 DIAGNOSIS — F988 Other specified behavioral and emotional disorders with onset usually occurring in childhood and adolescence: Secondary | ICD-10-CM | POA: Diagnosis present

## 2012-09-16 DIAGNOSIS — D509 Iron deficiency anemia, unspecified: Secondary | ICD-10-CM | POA: Diagnosis present

## 2012-09-16 DIAGNOSIS — Z87891 Personal history of nicotine dependence: Secondary | ICD-10-CM

## 2012-09-16 DIAGNOSIS — Z9119 Patient's noncompliance with other medical treatment and regimen: Secondary | ICD-10-CM

## 2012-09-16 DIAGNOSIS — I509 Heart failure, unspecified: Secondary | ICD-10-CM

## 2012-09-16 DIAGNOSIS — I5032 Chronic diastolic (congestive) heart failure: Secondary | ICD-10-CM | POA: Diagnosis present

## 2012-09-16 DIAGNOSIS — N058 Unspecified nephritic syndrome with other morphologic changes: Secondary | ICD-10-CM | POA: Diagnosis present

## 2012-09-16 DIAGNOSIS — Z888 Allergy status to other drugs, medicaments and biological substances status: Secondary | ICD-10-CM

## 2012-09-16 DIAGNOSIS — F3289 Other specified depressive episodes: Secondary | ICD-10-CM | POA: Diagnosis present

## 2012-09-16 DIAGNOSIS — R600 Localized edema: Secondary | ICD-10-CM

## 2012-09-16 DIAGNOSIS — F329 Major depressive disorder, single episode, unspecified: Secondary | ICD-10-CM | POA: Diagnosis present

## 2012-09-16 DIAGNOSIS — E785 Hyperlipidemia, unspecified: Secondary | ICD-10-CM | POA: Diagnosis present

## 2012-09-16 DIAGNOSIS — I2510 Atherosclerotic heart disease of native coronary artery without angina pectoris: Secondary | ICD-10-CM | POA: Diagnosis present

## 2012-09-16 DIAGNOSIS — IMO0002 Reserved for concepts with insufficient information to code with codable children: Secondary | ICD-10-CM

## 2012-09-16 DIAGNOSIS — N179 Acute kidney failure, unspecified: Secondary | ICD-10-CM | POA: Diagnosis not present

## 2012-09-16 DIAGNOSIS — R809 Proteinuria, unspecified: Secondary | ICD-10-CM | POA: Diagnosis present

## 2012-09-16 DIAGNOSIS — I440 Atrioventricular block, first degree: Secondary | ICD-10-CM | POA: Diagnosis present

## 2012-09-16 DIAGNOSIS — E878 Other disorders of electrolyte and fluid balance, not elsewhere classified: Secondary | ICD-10-CM

## 2012-09-16 DIAGNOSIS — G4733 Obstructive sleep apnea (adult) (pediatric): Secondary | ICD-10-CM | POA: Diagnosis present

## 2012-09-16 DIAGNOSIS — Z794 Long term (current) use of insulin: Secondary | ICD-10-CM

## 2012-09-16 DIAGNOSIS — Z880 Allergy status to penicillin: Secondary | ICD-10-CM

## 2012-09-16 DIAGNOSIS — I452 Bifascicular block: Secondary | ICD-10-CM | POA: Diagnosis present

## 2012-09-16 DIAGNOSIS — D649 Anemia, unspecified: Secondary | ICD-10-CM | POA: Diagnosis present

## 2012-09-16 DIAGNOSIS — I498 Other specified cardiac arrhythmias: Secondary | ICD-10-CM

## 2012-09-16 DIAGNOSIS — E1165 Type 2 diabetes mellitus with hyperglycemia: Secondary | ICD-10-CM | POA: Diagnosis present

## 2012-09-16 DIAGNOSIS — G7000 Myasthenia gravis without (acute) exacerbation: Secondary | ICD-10-CM | POA: Diagnosis present

## 2012-09-16 DIAGNOSIS — I5033 Acute on chronic diastolic (congestive) heart failure: Secondary | ICD-10-CM

## 2012-09-16 DIAGNOSIS — I10 Essential (primary) hypertension: Secondary | ICD-10-CM

## 2012-09-16 DIAGNOSIS — N183 Chronic kidney disease, stage 3 unspecified: Secondary | ICD-10-CM

## 2012-09-16 LAB — URINALYSIS, ROUTINE W REFLEX MICROSCOPIC
Glucose, UA: 250 mg/dL — AB
Ketones, ur: NEGATIVE mg/dL
Protein, ur: >300 mg/dL — AB
Urobilinogen, UA: 0.2 mg/dL (ref 0.0–1.0)

## 2012-09-16 LAB — BASIC METABOLIC PANEL
BUN: 60 mg/dL — ABNORMAL HIGH (ref 6–23)
Chloride: 82 mEq/L — ABNORMAL LOW (ref 96–112)
Creatinine, Ser: 1.55 mg/dL — ABNORMAL HIGH (ref 0.50–1.35)
GFR calc Af Amer: 51 mL/min — ABNORMAL LOW (ref >90)
Glucose, Bld: 331 mg/dL — ABNORMAL HIGH (ref 70–99)

## 2012-09-16 LAB — CBC WITH DIFFERENTIAL/PLATELET
Basophils Relative: 1 % (ref 0–1)
Eosinophils Absolute: 0.5 10*3/uL (ref 0.0–0.7)
HCT: 28.8 % — ABNORMAL LOW (ref 39.0–52.0)
Hemoglobin: 10.6 g/dL — ABNORMAL LOW (ref 13.0–17.0)
Lymphs Abs: 1 10*3/uL (ref 0.7–4.0)
MCH: 28 pg (ref 26.0–34.0)
MCHC: 36.8 g/dL — ABNORMAL HIGH (ref 30.0–36.0)
Monocytes Absolute: 0.6 10*3/uL (ref 0.1–1.0)
Monocytes Relative: 7 % (ref 3–12)
Neutro Abs: 5.7 10*3/uL (ref 1.7–7.7)

## 2012-09-16 MED ORDER — ENOXAPARIN SODIUM 40 MG/0.4ML ~~LOC~~ SOLN
40.0000 mg | SUBCUTANEOUS | Status: DC
  Filled 2012-09-16 (×8): qty 0.4

## 2012-09-16 MED ORDER — NAPROXEN 250 MG PO TABS
500.0000 mg | ORAL_TABLET | ORAL | Status: AC
  Administered 2012-09-16: 500 mg via ORAL
  Filled 2012-09-16: qty 2

## 2012-09-16 MED ORDER — PREDNISONE 5 MG PO TABS
15.0000 mg | ORAL_TABLET | Freq: Every day | ORAL | Status: DC
  Administered 2012-09-17 – 2012-09-21 (×2): 15 mg via ORAL
  Filled 2012-09-16 (×8): qty 1

## 2012-09-16 MED ORDER — SODIUM CHLORIDE 0.9 % IJ SOLN
3.0000 mL | Freq: Two times a day (BID) | INTRAMUSCULAR | Status: DC
  Administered 2012-09-17 – 2012-09-24 (×13): 3 mL via INTRAVENOUS

## 2012-09-16 MED ORDER — SODIUM CHLORIDE 0.9 % IJ SOLN
3.0000 mL | INTRAMUSCULAR | Status: DC | PRN
  Administered 2012-09-17: 3 mL via INTRAVENOUS

## 2012-09-16 MED ORDER — METOLAZONE 5 MG PO TABS
5.0000 mg | ORAL_TABLET | Freq: Every day | ORAL | Status: DC
  Administered 2012-09-17 – 2012-09-21 (×3): 5 mg via ORAL
  Filled 2012-09-16 (×6): qty 1

## 2012-09-16 MED ORDER — INSULIN GLARGINE 100 UNIT/ML ~~LOC~~ SOLN
20.0000 [IU] | Freq: Every day | SUBCUTANEOUS | Status: DC
  Filled 2012-09-16 (×9): qty 0.2

## 2012-09-16 MED ORDER — FUROSEMIDE 10 MG/ML IJ SOLN
120.0000 mg | Freq: Four times a day (QID) | INTRAVENOUS | Status: DC
  Administered 2012-09-16: 120 mg via INTRAVENOUS
  Filled 2012-09-16 (×2): qty 12

## 2012-09-16 MED ORDER — FUROSEMIDE 10 MG/ML IJ SOLN
120.0000 mg | Freq: Once | INTRAVENOUS | Status: DC
  Filled 2012-09-16: qty 12

## 2012-09-16 MED ORDER — MYCOPHENOLATE MOFETIL 250 MG PO CAPS
1000.0000 mg | ORAL_CAPSULE | Freq: Two times a day (BID) | ORAL | Status: DC
  Administered 2012-09-16 – 2012-09-24 (×15): 1000 mg via ORAL
  Filled 2012-09-16 (×17): qty 4

## 2012-09-16 MED ORDER — ONDANSETRON HCL 4 MG/2ML IJ SOLN: 4.0000 mg | Freq: Four times a day (QID) | INTRAMUSCULAR | Status: DC | PRN

## 2012-09-16 MED ORDER — OXYCODONE HCL 5 MG PO TABS
5.0000 mg | ORAL_TABLET | ORAL | Status: DC | PRN
  Administered 2012-09-16 – 2012-09-24 (×11): 5 mg via ORAL
  Filled 2012-09-16 (×11): qty 1

## 2012-09-16 MED ORDER — ZOLPIDEM TARTRATE 5 MG PO TABS
5.0000 mg | ORAL_TABLET | Freq: Every day | ORAL | Status: DC
  Administered 2012-09-16 – 2012-09-23 (×8): 5 mg via ORAL
  Filled 2012-09-16 (×8): qty 1

## 2012-09-16 MED ORDER — PYRIDOSTIGMINE BROMIDE 60 MG PO TABS
60.0000 mg | ORAL_TABLET | Freq: Every day | ORAL | Status: DC
  Administered 2012-09-17 – 2012-09-18 (×6): 60 mg via ORAL
  Filled 2012-09-16 (×12): qty 1

## 2012-09-16 MED ORDER — MIRTAZAPINE 30 MG PO TABS
30.0000 mg | ORAL_TABLET | Freq: Every day | ORAL | Status: DC
  Administered 2012-09-16 – 2012-09-23 (×8): 30 mg via ORAL
  Filled 2012-09-16 (×9): qty 1

## 2012-09-16 MED ORDER — SODIUM CHLORIDE 0.9 % IV SOLN: 250.0000 mL | INTRAVENOUS | Status: DC | PRN

## 2012-09-16 MED ORDER — PANTOPRAZOLE SODIUM 40 MG PO TBEC
40.0000 mg | DELAYED_RELEASE_TABLET | Freq: Two times a day (BID) | ORAL | Status: DC
  Administered 2012-09-16 – 2012-09-24 (×15): 40 mg via ORAL
  Filled 2012-09-16 (×13): qty 1

## 2012-09-16 MED ORDER — FUROSEMIDE 10 MG/ML IJ SOLN
60.0000 mg | Freq: Four times a day (QID) | INTRAMUSCULAR | Status: DC
  Administered 2012-09-17 (×2): 60 mg via INTRAVENOUS
  Filled 2012-09-16 (×4): qty 6

## 2012-09-16 MED ORDER — BENAZEPRIL HCL 20 MG PO TABS
20.0000 mg | ORAL_TABLET | Freq: Every day | ORAL | Status: DC
  Administered 2012-09-20 – 2012-09-24 (×2): 20 mg via ORAL
  Filled 2012-09-16 (×9): qty 1

## 2012-09-16 MED ORDER — ACETAMINOPHEN 325 MG PO TABS
650.0000 mg | ORAL_TABLET | ORAL | Status: DC | PRN
  Administered 2012-09-17 – 2012-09-18 (×3): 650 mg via ORAL
  Filled 2012-09-16 (×3): qty 2

## 2012-09-16 NOTE — ED Notes (Signed)
Explained to pt that, based on labs, he needs to make the decision to stay in the hospital.  Pt did not understand what everything meant, he did not understand what could happen if he made the decision to go home.  After educating pt on the dangers of leaving AMA pt has decided to stay stating "given that, there isn't much fight in me."

## 2012-09-16 NOTE — ED Provider Notes (Signed)
History     CSN: [id number]  Arrival date & time 09/16/12  1448   First MD Initiated Contact with Patient 09/16/12 1535      Chief Complaint  Patient presents with  . Leg Swelling    (Consider location/radiation/quality/duration/timing/severity/associated sxs/prior treatment) HPI  67 year old male with history of peripheral edema, diabetes, and diastolic heart failure presents for evaluations of leg swelling. Patient was at his doctor's office for regular visit yesterday. He was found to be in acute exacerbations of his heart failure and would need to be diarrhea his. He was also found to have persistent hyponatremia, hypochloremia, and likely cardiorenal syndrome. Although the recommendation was for admission, patient adamantly refused. However, patient presents to the ED and agrees to be admitted today and agrees to be seen by a heart doctor as previously recommended. Patient states he's has had increase leg swelling for months despite taking high dose of Lasix. He also reports increased short of breath with ambulation. SOB with walking less than a block. Patient sleeps on a sofa only, and using towels to prop himself up.  He is having increased difficulty sleeping due to shortness of breath. Symptom seems to be improving if he stands up straight. Otherwise patient denies fever, chills, headache, chest pain, productive cough, hemoptysis, abdominal pain, nausea, vomiting, diarrhea, new numbness or weakness, or rash.      Past Medical History  Diagnosis Date  . Sinus tachycardia 2006    a. Nonobstructive CAD by cath 2013. b. s/p atrial tachycardia ablation 08/2011 (sinus node modification)  . Orthostatic hypotension   . Unspecified essential hypertension   . Hyperlipidemia   . Cervical spondylosis with radiculopathy   . Syrinx     T5-T6  . Cord compression     Cord compression syndrome C5-C6, C6-C7  . ADD (attention deficit disorder)   . Decreased libido   . Depression   .  Diabetes mellitus type 2, uncontrolled, with complications     CAD, orthostatic hypotension, erectile dysfunction  . Pulmonary nodule, left     Evaluated 04/2011 by pulmonology - felt to be benign granuloma  . Peripheral edema   . Coronary artery disease, non-occlusive     Mild nonobstructive CAD by cath 08/2011  . Systolic congestive heart failure     By echo 2013 at [facility] - reported EF 45% per pt  . Colitis     with surface exudate  . Hemorrhoids     small  . Gastritis   . Clostridium difficile infection 02/15/2012    causing pseudomembranosu colitis  . Diastolic heart failure     grade 2 per echocardiogram (2011)  . Right bundle branch block and left posterior fascicular block   . Sleep apnea     occassionally uses cpap  . Myasthenia gravis     Status post thymectomy April 2012, hx of plasmapheresis    Past Surgical History  Procedure Laterality Date  . Thymectomy  april 2012  . Colonoscopy  11/13/2011    Procedure: COLONOSCOPY;  Surgeon: Theda Belfast, MD;  Location: [facility];  Service: Endoscopy;  Laterality: N/A;  . Cardiac catheterization    . Esophagogastroduodenoscopy  05/13/2012    Procedure: ESOPHAGOGASTRODUODENOSCOPY (EGD);  Surgeon: Iva Boop, MD;  Location: [facility];  Service: Endoscopy;  Laterality: N/A;  needs PAT w/ dx of Myastenia gravis and multiple meds. Pt requested lidocaine to numb IV site prior to start.  Very needle phobic.    Family History  Problem Relation Age  of Onset  . Breast cancer Mother   . Other Father     Beck's Sarcoid  . Colon cancer Neg Hx   . Prostate cancer Neg Hx     History  Substance Use Topics  . Smoking status: Former Smoker -- 2.50 packs/day for 30 years    Quit date: 06/09/1991  . Smokeless tobacco: Never Used  . Alcohol Use: No      Review of Systems  Constitutional:       A complete 10 system review of systems was obtained and all systems are negative except as noted in the HPI and PMH.    Allergies   Aminoglycosides; Beta adrenergic blockers; Calcium channel blockers; Metoprolol; Neuromuscular blocking agents; Other; Penicillins; and Quinine derivatives  Home Medications   Current Outpatient Rx  Name  Route  Sig  Dispense  Refill  . Alum & Mag Hydroxide-Simeth (MAGIC MOUTHWASH W/LIDOCAINE) SOLN   Oral   Take 10 mLs by mouth 4 (four) times daily as needed (pain). Swish and spit.   250 mL   0   . benazepril (LOTENSIN) 20 MG tablet   Oral   Take 1 tablet (20 mg total) by mouth daily before breakfast.   30 tablet   3   . Blood Glucose Monitoring Suppl (FREESTYLE FREEDOM) KIT   Does not apply   1 each by Does not apply route as needed.         . furosemide (LASIX) 80 MG tablet   Oral   Take 1 tablet (80 mg total) by mouth 4 (four) times daily.   120 tablet   1   . Glucose Blood (FREESTYLE LITE TEST VI)      1 each 4 (four) times daily.         . insulin aspart (NOVOLOG) 100 UNIT/ML injection      Take 5 units with each meal three times daily.   15 vial   12   . insulin glargine (LANTUS SOLOSTAR) 100 UNIT/ML injection   Subcutaneous   Inject 0.2 mLs (20 Units total) into the skin at bedtime.   15 mL   12   . Insulin Pen Needle (PEN NEEDLES) 31G X 6 MM MISC   Subcutaneous   Inject 1 each into the skin daily.   100 each   6   . metolazone (ZAROXOLYN) 5 MG tablet   Oral   Take 1 tablet (5 mg total) by mouth daily.   30 tablet   1   . mirtazapine (REMERON) 30 MG tablet   Oral   Take 1 tablet (30 mg total) by mouth at bedtime.   30 tablet   1   . mycophenolate (CELLCEPT) 500 MG tablet   Oral   Take 500 mg by mouth 2 (two) times daily.         Marland Kitchen oxyCODONE (OXY IR/ROXICODONE) 5 MG immediate release tablet   Oral   Take 1 tablet (5 mg total) by mouth every 4 (four) hours as needed. For pain   120 tablet   0   . pantoprazole (PROTONIX) 40 MG tablet   Oral   Take 1 tablet (40 mg total) by mouth daily.   30 tablet   6   . predniSONE (DELTASONE)  5 MG tablet   Oral   Take 15 mg by mouth daily.         Marland Kitchen pyridostigmine (MESTINON) 60 MG tablet   Oral   Take 60 mg by mouth 5 (  five) times daily. Medication starts to wear off closer to 2.5 hours         . zolpidem (AMBIEN) 10 MG tablet   Oral   Take 5 mg by mouth at bedtime.           BP 147/72  Pulse 79  Temp(Src) 97.2 F (36.2 C) (Oral)  Resp 18  SpO2 99%  Physical Exam  Nursing note and vitals reviewed. Constitutional: He is oriented to person, place, and time. He appears well-developed and well-nourished. No distress.  HENT:  Head: Atraumatic.  Eyes: Conjunctivae are normal.  Neck: Normal range of motion. Neck supple. No JVD present.  Cardiovascular: Normal rate and regular rhythm.   Murmur (2 out 6 systolic murmur best heard at the second intercostal space on the right side of chest, radiating to back.) heard. Pulmonary/Chest: Breath sounds normal. He is in respiratory distress (Patient is speaking in complete sentences,  appears to be in no acute respiratory distress.). He has no wheezes. He has no rales.  Abdominal:  Protuberance abdomen, nondistended  Musculoskeletal: He exhibits edema (3+ pitting edema bilaterally with brisk cap refill and no evidence of infection.). He exhibits no tenderness.  Neurological: He is alert and oriented to person, place, and time. He displays no tremor. He exhibits normal muscle tone. GCS eye subscore is 4. GCS verbal subscore is 5. GCS motor subscore is 6.  Skin: Skin is warm. No rash noted.  Psychiatric: He has a normal mood and affect.    ED Course  Procedures (including critical care time)   Date: 09/16/2012  Rate: 75  Rhythm: ectopic atrial rhythm  QRS Axis: normal  Intervals: normal  ST/T Wave abnormalities: nonspecific ST/T changes  Conduction Disutrbances:right bundle branch block, left posterior fascicular block and Multiple atrial premature complexes  Narrative Interpretation:   Old EKG Reviewed:  unchanged    4:03 PM Patient with significant peripheral edema, history of heart failure, and recent finding of chronic hyponatremia, hypochromic chloremia likely secondary  to Lasix use.  He is currently in no acute respiratory distress. He is mentating well.  Care discussed with attending.  4:35 PM Pt is uncooperative, refusing ECG, IV access, changing in gown.  He however appears to be in NAD, and without symptoms to suggest altered mental status.  He has a history of being noncompliant, as evident from prior visit notes.  6:08 PM Patient is currently taking two different prescription of Lasix.  He takes 40 mg 4 times daily and 80mg  four times daily for a total of 480mg  of Lasix daily.  His current sodium level is 119. Chloride is 82. Also have an elevated glucose of 331. His BUN is 60, creatinine 1.5, which is at his baseline. ProBNP is 2865, at his baseline. Chest x-ray shows no acute cardiopulmonary disease. There is mild vascular congestion without overt edema.   Patient will benefit from admission for further management of his cardiorenal syndrome.  6:45 PM I have consulted with internal medicine, who is aware of pt and will admit for further care.  Pt currently stable.  He agrees with admission.    Labs Reviewed  CBC WITH DIFFERENTIAL - Abnormal; Notable for the following:    RBC 3.78 (*)    Hemoglobin 10.6 (*)    HCT 28.8 (*)    MCV 76.2 (*)    MCHC 36.8 (*)    Eosinophils Relative 6 (*)    All other components within normal limits  BASIC METABOLIC PANEL - Abnormal;  Notable for the following:    Sodium 119 (*)    Chloride 82 (*)    Glucose, Bld 331 (*)    BUN 60 (*)    Creatinine, Ser 1.55 (*)    GFR calc non Af Amer 44 (*)    GFR calc Af Amer 51 (*)    All other components within normal limits  PRO B NATRIURETIC PEPTIDE - Abnormal; Notable for the following:    Pro B Natriuretic peptide (BNP) 2865.0 (*)    All other components within normal limits   Dg Chest 2  View  09/16/2012  *RADIOLOGY REPORT*  Clinical Data: Short of breath, lower extremity edema  CHEST - 2 VIEW  Comparison: Prior chest x-ray 09/05/2012  Findings: Stable to slightly enlarged right pleural effusion and associated right basilar opacity.  There is pulmonary vascular congestion without overt pulmonary edema.  Stable mild cardiomegaly with left atrial enlargement.  Trace atherosclerotic calcification in the thoracic aorta.  No acute osseous abnormality.  IMPRESSION:  1.  No acute cardiopulmonary disease. 2.  Stable to incrementally enlarged right pleural effusion and associated basilar atelectasis. 3.  Mild vascular congestion without overt edema. 4.  Left atrial enlargement   Original Report Authenticated By: Malachy Moan, M.D.      1. Cardiorenal syndrome with renal failure   2. CHF (congestive heart failure), NYHA class III   3. Hyponatremia   4. Hypochloremia   5. Peripheral edema       MDM  BP 151/69  Pulse 78  Temp(Src) 97.2 F (36.2 C) (Oral)  Resp 18  Wt 194 lb 8 oz (88.225 kg)  BMI 33.37 kg/m2  SpO2 98%  I have reviewed nursing notes and vital signs. I personally reviewed the imaging tests through PACS system  I reviewed available ER/hospitalization records thought the EMR         Fayrene Helper, [place] 09/16/12 1848

## 2012-09-16 NOTE — ED Provider Notes (Signed)
Medical screening examination/treatment/procedure(s) were conducted as a shared visit with non-physician practitioner(s) and myself.  I personally evaluated the patient during the encounter  Pt with acute on chronic CHF, numerous ED visits and admission for same. Seen at PCP yesterday and advised admission but he refused. Here now for eval.   Leonette Most B. Bernette Mayers, MD 09/16/12 6295

## 2012-09-16 NOTE — Telephone Encounter (Signed)
He would need to be evaluated before arrangements for admission can be made by someone.  Since we have no spots to have him seen in clinic today he would have to go through the ED.

## 2012-09-16 NOTE — ED Notes (Signed)
Sodium 119 per lab

## 2012-09-16 NOTE — ED Notes (Addendum)
Pt reports he told by PCP to come in to have extra fluid drawn off. States he was suppose to come in for an appt yesterday but was unable to do so at the scheduled time to see Dr. Dian Situ. Denies any chest pain or SOB with the swelling. Denies any changes to medications.

## 2012-09-16 NOTE — Addendum Note (Signed)
Addended by: Leodis Sias on: 09/16/2012 03:13 PM   Modules accepted: Orders

## 2012-09-16 NOTE — Telephone Encounter (Signed)
Talked with Dr Tonny Branch and pt advised to come to ED for evaluation and possible admission. Pt informed.

## 2012-09-16 NOTE — ED Notes (Signed)
Spoke with Dr. Bosie Clos. MD wants pt to have one time dose of 120mg  Lasix IV. Spoke with Fleet Contras in pharmacy, and she stated that pharmacy would change the order to reflect Dr. Marge Duncans request.

## 2012-09-16 NOTE — ED Notes (Signed)
Pt refusing to be hooked to cardiac monitor, doesn't want to have a pulse ox either, st's "I don't want all those stickers hooked to me, my oxygen is always 97%."  Attempted to explain importance of cardiac monitoring given his condition however pt still refused.

## 2012-09-16 NOTE — ED Notes (Signed)
Pt refuses to change into a gown, refuses to stay in bed to be on the monitor, refuses to give a urine sample, and refuses to be transferred to the floor on the stretcher.

## 2012-09-16 NOTE — ED Notes (Signed)
Pt's Wt. Is 194.5 lbs.

## 2012-09-16 NOTE — Telephone Encounter (Signed)
[name] was referred to CSW for referral to care management.  CSW placed call to Mr. Seiden to discuss services available through [facility].  Pt states he is unsure and is still "kinda sleep", call was after 10:00 am.  However, pt requested CSW to relay message to his PCP regarding an appt with a "heart doctor".  Pt stated he "made a mistake not checking in with the heart doctor and would like to check in with the heart doctor today".  CSW sent message to PCP regarding pt's concern.  Pt aware PCP will not be in office until this afternoon.  CSW informed [name] will provide informational brochure on [name], Tuesday during his f/u appt with Dr. Lavena Bullion.

## 2012-09-16 NOTE — Telephone Encounter (Signed)
Pt called stating he "messed up" by not seeing cardiologist yesterday and being admitted. Pt would like to be admitted today and see cards. Pt # [id number]  Breathing is okay now, rt leg more swollen, he slept at desk last night but did sleep.   He wants to know if he would see the "Heart Doctor" if admitted through ED.  He wants to know if he should just wait and see how he does over the week end.

## 2012-09-16 NOTE — H&P (Signed)
Hospital Admission Note Date: 09/16/2012  Patient name: [name] Medical record number: [id number] Date of birth: 07-14-1945 Age: 67 y.o. Gender: male PCP: PRIBULA,CHRISTOPHER, MD  Medical Service: Internal Medicine  Attending physician: Dr. Kem Kays     1st Contact: Dr. Earlene Plater Pager:[phone] 2nd Contact: Dr. Manson Passey Pager:[phone] After 5 pm or weekends: 1st Contact: Pager: [phone] 2nd Contact: Pager: [phone]  Chief Complaint: Leg swelling, shortness of breath   History of Present Illness: 67 year old male history of chronic diastolic heart failure (grade 3), history of peripheral edema, uncontrolled diabetes, HTN presents for worsening leg swelling and shortness of breath at rest and with exertion. He also complains of orthopnea not being able to lie flat and he sleeps on the a desk with his head down and is unable to lie down in the bed.  He was seen in the [facility] 09/16/12 for leg edema at that time Metolazone was added and Lasix was increased from 80 mg QID to 120 mg QID.  He states his dry weight is 188 lbs.  He is not having adequate urine output after the increase in Lasix and addition of Metolazone.  He mentions unintentionally a couple of days ago he ate 2 pretzels and has been drinking 16 oz of water in a gatorade container. His cardiologist is in [place].      Meds: Current Outpatient Rx  Name  Route  Sig  Dispense  Refill  . benazepril (LOTENSIN) 20 MG tablet   Oral   Take 1 tablet (20 mg total) by mouth daily before breakfast.   30 tablet   3   . furosemide (LASIX) 40 MG tablet   Oral   Take 40 mg by mouth 4 (four) times daily.         . furosemide (LASIX) 80 MG tablet   Oral   Take 1 tablet (80 mg total) by mouth 4 (four) times daily.   120 tablet   1   . insulin aspart (NOVOLOG) 100 UNIT/ML injection      Take 5 units with each meal three times daily.   15 vial   12   . insulin glargine (LANTUS SOLOSTAR) 100 UNIT/ML injection   Subcutaneous   Inject 0.2  mLs (20 Units total) into the skin at bedtime.   15 mL   12   . metolazone (ZAROXOLYN) 5 MG tablet   Oral   Take 1 tablet (5 mg total) by mouth daily.   30 tablet   1   . mirtazapine (REMERON) 30 MG tablet   Oral   Take 1 tablet (30 mg total) by mouth at bedtime.   30 tablet   1   . mycophenolate (CELLCEPT) 500 MG tablet   Oral   Take 1,000 mg by mouth 2 (two) times daily.          Marland Kitchen oxyCODONE (OXY IR/ROXICODONE) 5 MG immediate release tablet   Oral   Take 1 tablet (5 mg total) by mouth every 4 (four) hours as needed. For pain   120 tablet   0   . pantoprazole (PROTONIX) 40 MG tablet   Oral   Take 1 tablet (40 mg total) by mouth daily.   30 tablet   6   . predniSONE (DELTASONE) 5 MG tablet   Oral   Take 15 mg by mouth daily.         Marland Kitchen pyridostigmine (MESTINON) 60 MG tablet   Oral   Take 60 mg by mouth 5 (five)  times daily. Medication starts to wear off closer to 2.5 hours         . zolpidem (AMBIEN) 10 MG tablet   Oral   Take 5 mg by mouth at bedtime.         . Blood Glucose Monitoring Suppl (FREESTYLE FREEDOM) KIT   Does not apply   1 each by Does not apply route as needed.         . Glucose Blood (FREESTYLE LITE TEST VI)      1 each 4 (four) times daily.         . Insulin Pen Needle (PEN NEEDLES) 31G X 6 MM MISC   Subcutaneous   Inject 1 each into the skin daily.   100 each   6     Allergies: Allergies as of 09/16/2012 - Review Complete 09/16/2012  Allergen Reaction Noted  . Aminoglycosides  04/14/2011  . Beta adrenergic blockers Other (See Comments)   . Calcium channel blockers Other (See Comments)   . Metoprolol Other (See Comments) 09/12/2011  . Neuromuscular blocking agents Other (See Comments) 04/14/2011  . Other Other (See Comments) 09/12/2011  . Penicillins Other (See Comments) 09/12/2011  . Quinine derivatives Other (See Comments) 09/12/2011   Past Medical History  Diagnosis Date  . Sinus tachycardia 2006    a.  Nonobstructive CAD by cath 2013. b. s/p atrial tachycardia ablation 08/2011 (sinus node modification)  . Orthostatic hypotension   . Unspecified essential hypertension   . Hyperlipidemia   . Cervical spondylosis with radiculopathy   . Syrinx     T5-T6  . Cord compression     Cord compression syndrome C5-C6, C6-C7  . ADD (attention deficit disorder)   . Decreased libido   . Depression   . Diabetes mellitus type 2, uncontrolled, with complications     CAD, orthostatic hypotension, erectile dysfunction  . Pulmonary nodule, left     Evaluated 04/2011 by pulmonology - felt to be benign granuloma  . Peripheral edema   . Coronary artery disease, non-occlusive     Mild nonobstructive CAD by cath 08/2011  . Systolic congestive heart failure     By echo 2013 at [facility] - reported EF 45% per pt  . Colitis     with surface exudate  . Hemorrhoids     small  . Gastritis   . Clostridium difficile infection 02/15/2012    causing pseudomembranosu colitis  . Diastolic heart failure     grade 2 per echocardiogram (2011)  . Right bundle branch block and left posterior fascicular block   . Sleep apnea     occassionally uses cpap  . 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 Thymectomy  april 2012  . Colonoscopy  11/13/2011    Procedure: COLONOSCOPY;  Surgeon: Theda Belfast, MD;  Location: [facility] ENDOSCOPY;  Service: Endoscopy;  Laterality: N/A;  . Cardiac catheterization    . Esophagogastroduodenoscopy  05/13/2012    Procedure: ESOPHAGOGASTRODUODENOSCOPY (EGD);  Surgeon: Iva Boop, MD;  Location: [facility];  Service: Endoscopy;  Laterality: N/A;  needs PAT w/ dx of Myastenia gravis and multiple meds. Pt requested lidocaine to numb IV site prior to start.  Very needle phobic.   Family History  Problem Relation Age of Onset  . Breast cancer Mother   . Other Father     Beck's Sarcoid  . Colon cancer Neg Hx    .  Prostate cancer Neg Hx    History   Social History  . Marital Status: Single    Spouse Name: N/A    Number of Children: 1  . Years of Education: N/A   Occupational History  . Retired     Surveyor, minerals   Social History Main Topics  . Smoking status: Former Smoker -- 2.50 packs/day for 30 years    Quit date: 06/09/1991  . Smokeless tobacco: Never Used  . Alcohol Use: No  . Drug Use: No  . Sexually Active: Not on file   Other Topics Concern  . Not on file   Social History Narrative   Has a single man's lifestyle and will remind anyone of it when asked.   Prefers to see only male physicians   Retired Surveyor, minerals      Lives at home with mother (43 y.o)    Review of Systems: General: denies fever, chills, +decreased appetite  HEENT: +h/a  CV: +orthopnea, denies chest pain or PND Lungs:+sob with rest and exertion Ab/GU: denies abdominal pain or constipation or nausea or vomiting  Extremities: +lower extremity edema (worsening)  Neuro: +h/a   Physical Exam: Blood pressure 165/80, pulse 76, temperature 98 F (36.7 C), temperature source Oral, resp. rate 18, weight 194 lb 8 oz (88.225 kg), SpO2 99.00%. General: sitting up by bed, nad, alert and oriented x 3  HEENT: [place]/at, wet oral mucosa  CV: RRR, no murmurs  Lungs: ctab  Ab/GU: obese, soft, ntnd, nl bowel sounds  Extremities: 2 to 3+ lower extremity pitting edema, warm no cyanosis or edema  Neuro: alert and oriented x 3, moving all 4 extremities   Lab results: Basic Metabolic Panel:  Recent Labs  78/46/96 0913 09/16/12 1638  NA 122* 119*  K 4.6 4.5  CL 84* 82*  CO2 28 28  GLUCOSE 365* 331*  BUN 63* 60*  CREATININE 1.42* 1.55*  CALCIUM 9.4 9.4   CBC:  Recent Labs  09/16/12 1638  WBC 7.8  NEUTROABS 5.7  HGB 10.6*  HCT 28.8*  MCV 76.2*  PLT 300   BNP:  Recent Labs  09/16/12 1639  PROBNP 2865.0*   CBG:  Recent Labs  09/15/12 1041  GLUCAP 311*   Urinalysis: No results found for this  basename: COLORURINE, APPERANCEUR, LABSPEC, PHURINE, GLUCOSEU, HGBUR, BILIRUBINUR, KETONESUR, PROTEINUR, UROBILINOGEN, NITRITE, LEUKOCYTESUR,  in the last 72 hours  Misc. Labs: UA  Imaging results:  Dg Chest 2 View  09/16/2012  *RADIOLOGY REPORT*  Clinical Data: Short of breath, lower extremity edema  CHEST - 2 VIEW  Comparison: Prior chest x-ray 09/05/2012  Findings: Stable to slightly enlarged right pleural effusion and associated right basilar opacity.  There is pulmonary vascular congestion without overt pulmonary edema.  Stable mild cardiomegaly with left atrial enlargement.  Trace atherosclerotic calcification in the thoracic aorta.  No acute osseous abnormality.  IMPRESSION:  1.  No acute cardiopulmonary disease. 2.  Stable to incrementally enlarged right pleural effusion and associated basilar atelectasis. 3.  Mild vascular congestion without overt edema. 4.  Left atrial enlargement   Original Report Authenticated By: Malachy Moan, M.D.     Other results: EKG: 75 ectopic atrial rhythm normal axis, normal intervals, non specific ST changes, RBBB, left posterior fascicular block and multiple atrial premature complexes  09/06/12 echo   Study Conclusions  - Left ventricle: The cavity size was normal. Wall thickness was increased in a pattern of mild LVH. There was mild focal basal hypertrophy of the septum, with  an appearance suggesting concentric remodeling (increased wall thickness with normal wall mass). Systolic function was vigorous. The estimated ejection fraction was in the range of 65% to 70%. Although no diagnostic regional wall motion abnormality was identified, this possibility cannot be completely excluded on the basis of this study. Doppler parameters are consistent with a reversible restrictive pattern, indicative of decreased left ventricular diastolic compliance and/or increased left atrial pressure (grade 3 diastolic dysfunction). Doppler parameters are consistent  with both elevated ventricular end-diastolic filling pressure and elevated left atrial filling pressure. - Procedure narrative: Transthoracic echocardiography. Image quality was adequate, but doppler data was inadequate for assessment of either Aortic or Mitral stenosis. - Aortic valve: Cusp separation was reduced. Valve mobility was mildly restricted. - Mitral valve: Moderately calcified annulus. Mildly calcified leaflets . Mild focal calcification and tethering of the anterior leaflet, with involvement of chords. - Left atrium: The atrium was severely dilated. - Right ventricle: The cavity size was mildly dilated. Wall thickness was normal. - Right atrium: The atrium was mildly dilated. - Pulmonary arteries: PA peak pressure: 59mm Hg (S). Impressions:  - Occaisional ectopy was noted on this study, making this study technically difficult for the assessment of cardiac function. The lack of adequate doppler data makes assessment of Aortic or Mitral stenosis impossible. The right ventricular systolic pressure was increased consistent with moderate to severe pulmonary hypertension.   Assessment & Plan by Problem: 67 year old male history of chronic diastolic heart failure (grade 3), history of peripheral edema (worsening), uncontrolled diabetes, HTN presents for worsening leg swelling and shortness of breath at rest and with exertion.   1. Acute on chronic diastolic (grade 3) CHF with history of multiple admissions -proBNP 2865.0 not drastically elevated from previous. CXR with mild vascular congestion without overt edema. Left atrial enlargement.  No crackles on exam but with 2-3+ lower extremity edema.  Weight is 194.5 pounds and the patient states his dry weight is 188.   -Recently added Metolazone and increased Lasix from 80 mg QID 120 mg QID on 09/16/12 at clinic appt  -We will give iv Lasix for diuresis  -He was given 120 mg iv in the ED x 1.  Then will dose Lasix 60 mg iv qid   -Continue ACEI.  Patient has BB in allergy list  -Strict i/o, daily weights, fluid restrict -f/u appt with [facility] 4/15  -Consider evaluation by heart failure team inpatient or outpatient.  His primary cardiologist is in [place]   2. DM 2 uncontrolled  -HA1C 11.9 07/2012  -Hyperglycemic this admission though patient states he does like his fsbs to be below 230s or 240s.  He is not taking his Lantus at home prior to admission -Monitor cbg, SSI  3. HTN -Monitor BP.  BP on admission was 147/72 -Continue Lotensin 20 mg daily  4. Chronic kidney disease -Creatinine 1.55 on admission.  Creatinine baseline 1.3-1.4  -trend BMET with diuresis   5. Normocytic anemia  -Hbg 10.6 on admission.  Baseline Hbg 11-12.   -trend CBC   6. Myastenia gravis  -Continue home medication Mestinon 60 mg fives times a day starting a 7 am then repeat q 2.5 hours per patient   7. F/E/N -Asymptomatic Hyponatremia (119) likely due to heart failure. Patient needs fluid restriction (1500 mL).  With Na <120 indicates severe heart failure and poor prognosis  -Hypochloremia (82)  -will monitor and replace electrolytes prn  -heart healthy diet 2g sodium  8.DVT px  -Lovenox   Dispo: Disposition is  deferred at this time, awaiting improvement of current medical problems. Anticipated discharge in approximately 1-2 day(s).   The patient does have a current PCP (PRIBULA,CHRISTOPHER, MD), therefore will be requiring OPC follow-up after discharge.   The patient does not have transportation limitations that hinder transportation to clinic appointments.  SignedAnnett Gula [phone] 09/16/2012, 9:42 PM

## 2012-09-16 NOTE — ED Notes (Signed)
States he had blood work done yesterday at the clinic downstairs

## 2012-09-17 DIAGNOSIS — I1310 Hypertensive heart and chronic kidney disease without heart failure, with stage 1 through stage 4 chronic kidney disease, or unspecified chronic kidney disease: Secondary | ICD-10-CM

## 2012-09-17 LAB — BASIC METABOLIC PANEL
CO2: 29 mEq/L (ref 19–32)
Calcium: 9.4 mg/dL (ref 8.4–10.5)
Creatinine, Ser: 1.5 mg/dL — ABNORMAL HIGH (ref 0.50–1.35)
GFR calc non Af Amer: 46 mL/min — ABNORMAL LOW (ref >90)
Glucose, Bld: 385 mg/dL — ABNORMAL HIGH (ref 70–99)

## 2012-09-17 LAB — GLUCOSE, CAPILLARY: Glucose-Capillary: 395 mg/dL — ABNORMAL HIGH (ref 70–99)

## 2012-09-17 MED ORDER — FUROSEMIDE 10 MG/ML IJ SOLN
80.0000 mg | Freq: Four times a day (QID) | INTRAMUSCULAR | Status: DC
  Administered 2012-09-17 – 2012-09-19 (×7): 80 mg via INTRAVENOUS
  Filled 2012-09-17 (×10): qty 8

## 2012-09-17 MED ORDER — NAPROXEN 375 MG PO TABS
375.0000 mg | ORAL_TABLET | Freq: Once | ORAL | Status: AC
  Administered 2012-09-17: 375 mg via ORAL
  Filled 2012-09-17: qty 1

## 2012-09-17 MED ORDER — INSULIN ASPART 100 UNIT/ML ~~LOC~~ SOLN
0.0000 [IU] | Freq: Three times a day (TID) | SUBCUTANEOUS | Status: DC
  Administered 2012-09-21 – 2012-09-23 (×3): 9 [IU] via SUBCUTANEOUS

## 2012-09-17 MED ORDER — INSULIN ASPART 100 UNIT/ML ~~LOC~~ SOLN
0.0000 [IU] | Freq: Every day | SUBCUTANEOUS | Status: DC
Start: 2012-09-17 — End: 2012-09-24
  Administered 2012-09-22: 5 [IU] via SUBCUTANEOUS

## 2012-09-17 NOTE — Progress Notes (Signed)
Subjective:    Interval Events:  Patient feels okay.  He is not ready to treat his hyperglycemia yet.  He says he has had a good, but not an overwhelming, response to furosemide.    Objective:    Vital Signs:   Filed Vitals:   09/16/12 2126 09/16/12 2230 09/17/12 0154 09/17/12 0535  BP: 165/80 159/79 144/75 136/74  Pulse: 76 76 75 79  Temp: 98 F (36.7 C) 98.1 F (36.7 C) 98 F (36.7 C) 98.2 F (36.8 C)  TempSrc: Oral Oral Oral Oral  Resp: 18 18 18 18   Height:  5\' 7"  (1.702 m)    Weight:  195 lb 14.4 oz (88.86 kg)  195 lb 8.8 oz (88.7 kg)  SpO2: 99% 100% 99% 99%    Weights: Filed Weights   09/16/12 1730 09/16/12 2230 09/17/12 0535  Weight: 194 lb 8 oz (88.225 kg) 195 lb 14.4 oz (88.86 kg) 195 lb 8.8 oz (88.7 kg)   Net since admission:  -0.2 kg   Intake/Output:   Intake/Output Summary (Last 24 hours) at 09/17/12 1413 Last data filed at 09/17/12 0950  Gross per 24 hour  Intake   1323 ml  Output    900 ml  Net    423 ml     Net since admission:  +0.4 L   Physical Exam: GENERAL: alert and oriented; pacing his room; no distress EYES: pupils equal, round, and reactive to light; sclera anicteric ENT: moist mucosa LUNGS: clear to auscultation bilaterally, normal work of breathing HEART: normal rate; regular rhythm; normal S1 and S2, no S3 or S4 appreciated; no murmurs, rubs, or clicks ABDOMEN: soft, non-tender, normal bowel sounds, no masses palpated EXTREMITIES: 2+ edema SKIN: normal turgor    Labs: Basic Metabolic Panel: Lab 09/15/12 0913 09/16/12 1638 09/17/12 0600  NA 122* 119* 122*  K 4.6 4.5 4.3  CL 84* 82* 84*  CO2 28 28 29   GLUCOSE 365* 331* 385*  BUN 63* 60* 63*  CREATININE 1.42* 1.55* 1.50*  CALCIUM 9.4 9.4 9.4    CBC: Lab 09/16/12 1638  WBC 7.8  NEUTROABS 5.7  HGB 10.6*  HCT 28.8*  MCV 76.2*  PLT 300    BNP (last 3 results):  09/03/12 1656 09/05/12 1920 09/16/12 1639  PROBNP 2552.0* 2519.0* 2865.0*    CBG: Lab  09/15/12 1041  GLUCAP 311*     Medications:    Infusions:     Scheduled Medications: . benazepril  20 mg Oral QAC breakfast  . enoxaparin (LOVENOX) injection  40 mg Subcutaneous Q24H  . furosemide  120 mg Intravenous Once  . furosemide  60 mg Intravenous Q6H  . insulin glargine  20 Units Subcutaneous QHS  . metolazone  5 mg Oral Daily  . mirtazapine  30 mg Oral QHS  . mycophenolate  1,000 mg Oral BID  . pantoprazole  40 mg Oral BID  . predniSONE  15 mg Oral Daily  . pyridostigmine  60 mg Oral 5 X Daily  . sodium chloride  3 mL Intravenous Q12H  . zolpidem  5 mg Oral QHS     PRN Medications: sodium chloride, acetaminophen, ondansetron (ZOFRAN) IV, oxyCODONE, sodium chloride    Assessment/ Plan:    1.   Acute exacerbation of chronic diastolic CHF:  An echocardiogram from 2011 demonstrated normal systolic function with grade 2 diastolic dysfunction. The echocardiogram performed earlier this month demonstrates vigorous systolic function with an EF of 65-70% but grade 3 diastolic dysfunction.  This is  complicated by near nephrotic range proteinuria secondary to diabetic nephropathy. His diuretic regimen was recently increased to furosemide 120 mg by mouth 4 times a day plus metolazone 5 mg daily. Here in the hospital, we are treating him with IV furosemide 60 mg every 6 hours in metolazone 5 mg orally every day. - Increasing IV furosemide to 80 mg 4 times a day - Continue PO metolazone 5 mg daily - Strict I&Os - Daily weights  2.   Diabetic nephropathy with proteinuria:  Urine protein to creatinine ratio was measured at his last hospitalization earlier this month:  it was 2.56. This is complicating his fluid status and management of CHF. We will diurese as noted above.  3.   Hyponatremia:  This is been a problem for some time.   His sodium was 126 at discharge earlier in the month. It was up to 128 a few days ago. It was 119 at admission and now up to 122 today. We will address  this by diuresing him as noted above.  4.   Stage III CKD:  Back in November and December of 2013 his creatinine was normal. It has been steadily in the 1.5-1.7 range this year. Given the findings from the echocardiogram and the elevated urine protein-to-creatinine ratio, I think progression of diabetic nephropathy is the primary cause; he is not yet in nephrotic range proteinuria but is spilling significant protein into his urine. His creatinine has down treated with diuresis, indicating a small component of cardiorenal syndrome. We will continue to diurese and follow his creatinine closely.   5.   Chronic microcytic anemia:  Baseline hemoglobin is around 12. It is currently 10.6. Reticulocyte count previously was 2.4% with an index of 1.82, indicating low but near-normal reticulocyte production. Iron panel was normal except for a slightly elevated ferritin of 323. I think this indicates an anemia of chronic inflammation with superimposed hemodilution.  6.   Type II diabetes mellitus, uncontrolled, with complications:  Patient has persistently refused to allow [place] to treat his diabetes. Despite trying to educate him over and over again, he does not want his sugars to fall below 300.  His last hemoglobin A1c was 11.9 in February.  He refused to be insulin glargine which was ordered for last night. I will add correctional scale insulin aspart today, but I doubt he will let the nurses administer this. - Continue insulin glargine 20 units at bedtime - Add sensitive scale correctional insulin aspart  7.   Hypertension:  In addition to diuretics, he takes benazepril 20 mg daily. Over the past year his blood pressure has averaged about 150/85. His blood pressures have been running in a similar range here in the hospital.  We will continue the benazepril here. - Benazepril 20 mg daily   8.   Myasthenia gravis: Managed at [facility] for this. Stable on his home regimen, which we have continued here.  - Mycophenolate  1000 mg twice a day  - Prednisone 15 mg daily  - Pyridostigmine 60mg  5 times daily  9.   Prophylaxis:  Enoxaparin 40 mg [place] daily  10. Disposition:  Patient's PCP is [facility]. He will require OPC followup. He will not need outpatient services. He has no transportation limitations.    Length of Stay: 1 days   Signed by:  Dorthula Rue. Earlene Plater, MD PGY-I, Internal Medicine Pager [phone]  09/17/2012, 9:59 AM

## 2012-09-17 NOTE — Progress Notes (Signed)
Pt. Requesting shower.  MD on call said it was ok tonight.  Will assist pt. With shower.

## 2012-09-17 NOTE — H&P (Signed)
Internal Medicine Attending Admission Note Date: 09/17/2012  Patient name: [name] Medical record number: [id number] Date of birth: 01-24-1946 Age: 67 y.o. Gender: male  I saw and evaluated the patient. I reviewed the resident's note and I agree with the resident's findings and plan as documented in the resident's note.  Chief Complaint(s): Worsening shortness of breath and leg swelling  History - key components related to admission: Patient is a 67 year old man with past medical history most significant for chronic heart failure with preserved ejection fraction, uncontrolled diabetes, hypertension and history of medication and dietary noncompliance comes back again with worsening shortness of breath and leg swelling. Patient was discharged from the hospital 10 days ago and was seen twice last week for followup with last followup yesterday. Patient has been having progressive swelling of his legs and worsening shortness of breath since discharge. His diuretics were increased to 120 mg 4 times a day by internal medicine clinic which did not improve his swelling or shortness of breath.  Patient admits to eating pretzels and mashed potatoes with salt and gravy after which he noted the worsening. Patient also tells me that he has not been taking metolazone "the kicker" medication since discharge.  15 point review of system is negative except for as noted above.  Past medical history, past surgical history, medications, social history and family history was reviewed and is as per resident's note.  Physical Exam - key components related to admission:  Filed Vitals:   09/16/12 2126 09/16/12 2230 09/17/12 0154 09/17/12 0535  BP: 165/80 159/79 144/75 136/74  Pulse: 76 76 75 79  Temp: 98 F (36.7 C) 98.1 F (36.7 C) 98 F (36.7 C) 98.2 F (36.8 C)  TempSrc: Oral Oral Oral Oral  Resp: 18 18 18 18   Height:  5\' 7"  (1.702 m)    Weight:  195 lb 14.4 oz (88.86 kg)  195 lb 8.8 oz (88.7 kg)  SpO2:  99% 100% 99% 99%    Lab results:  General: sitting up by bed, nad, alert and oriented x 3  HEENT: [place]/at, wet oral mucosa  CV: RRR, no murmurs  Lungs: Clear to auscultation bilaterally except decreased breath sounds in the right base, no rhonchi, rales or wheezing  Ab/GU: obese, soft, ntnd, nl bowel sounds  Extremities: 2 to 3+ lower extremity pitting edema, warm no cyanosis or edema  Neuro: alert and oriented x 3, moving all 4 extremities    Basic Metabolic Panel:  Recent Labs  40/98/11 1638 09/17/12 0600  NA 119* 122*  K 4.5 4.3  CL 82* 84*  CO2 28 29  GLUCOSE 331* 385*  BUN 60* 63*  CREATININE 1.55* 1.50*  CALCIUM 9.4 9.4   CBC:  Recent Labs  09/16/12 1638  WBC 7.8  NEUTROABS 5.7  HGB 10.6*  HCT 28.8*  MCV 76.2*  PLT 300   CBG:  Recent Labs  09/15/12 1041  GLUCAP 311*     Imaging results:  Dg Chest 2 View  09/16/2012  *RADIOLOGY REPORT*  Clinical Data: Short of breath, lower extremity edema  CHEST - 2 VIEW  Comparison: Prior chest x-ray 09/05/2012  Findings: Stable to slightly enlarged right pleural effusion and associated right basilar opacity.  There is pulmonary vascular congestion without overt pulmonary edema.  Stable mild cardiomegaly with left atrial enlargement.  Trace atherosclerotic calcification in the thoracic aorta.  No acute osseous abnormality.  IMPRESSION:  1.  No acute cardiopulmonary disease. 2.  Stable to incrementally enlarged right  pleural effusion and associated basilar atelectasis. 3.  Mild vascular congestion without overt edema. 4.  Left atrial enlargement   Original Report Authenticated By: Malachy Moan, M.D.     Other results: EKG: 88 beats per minute, normal sinus rhythm, no ST or T wave changes consistent with ischemia noted  Assessment & Plan by Problem:  Principal Problem:   Acute on chronic diastolic heart failure Active Problems:   Diabetes mellitus type II, uncontrolled   HYPERTENSION   Hyponatremia   CKD  (chronic kidney disease), stage III   Normocytic anemia  Patient is a 67 year old man with past medical history significant for myasthenia gravis, chronic heart failure with preserved ejection fraction, hyperlipidemia, depression, type 2 diabetes, obstructive sleep apnea and medication noncompliance who comes back in for worsening shortness of breath and lower extremity edema after dietary indiscretion and medication noncompliance. -Start the patient on lasix 60 mg IV every 6 hours and metolazone 5 mg 30 minutes before Lasix -Patient is agreeable to being followed by congestive heart failure team(patient was seen Dr. Jeralyn Bennett at [facility] but she is graduating and will not be his doctor anymore) -Patient on optimal dose of ACE inhibitor -Not a candidate for beta blocker due to myasthenia gravis -Patient was on digoxin but it was discontinued along the line(patient does not know the reason), we may consider starting him on digoxin once we achieve adequate diuresis -Strict ins and outs, daily weights, elevate legs above heart, fluid restriction to 1500 cc daily, salt restriction -Patient may need milrinone if we are unable to achieve adequate diuresis in next 24 hours -His dry weight was 168 in last 6 months and he weighs currently 194 pounds which is above  Hyponatremia: This is a very poor prognostic sign in a patient of heart failure. This is most likely secondary to intravascular hypervolemia. We will continue to monitor and this will hopefully improve on diuresis  Stage III chronic kidney disease: This is cardio renal syndrome and again I believe that patient will require IV inotrope or milrinone to break this cycle in order to improve renal perfusion.  Rest of the medical management as per resident's note.  Lars Mage MD Faculty-Internal Medicine Residency Program

## 2012-09-17 NOTE — Progress Notes (Signed)
Patient requesting Aleve for hip pain, Notified Dr. Earlene Plater, new orders given and will administer per doctors order and continue to monitor patient as needed to end of shift.   Also just for informational purposes, Patient has refused insulin coverage for Lunch and at dinner sliding scale insulin in which he was supposed to receive 9 units at lunch and 9 units at dinner. Patient states," I'm not taking any insulin because my sugar will drop on its own. If it get to 290 then I will get cool and clammy." Explained the risks of how elevated sugars such as his can affect the body and how effective insulin coverage can prevent such risks and patient still refused and stated, "NO!"Notified Dr. Earlene Plater and will notify oncoming nurse of this matter.   Last but not least, Patient was very upset earlier today that pharmacy had not scheduled the medication Mestinon the way he takes it at home. Called pharmacy and nuse and patient spoke with pharmacist in regards to how he wants to take med. Pharmacist stated since he gets it pretty often at 5x per day, and his first dose was around 0930, as long as he get what is ordered and within the 5 he may request Mest[place]on the way he takes it at home. If he request the medication after 5 doses have been given for the day as ordered, then attending nurse at that time will need to call MD to have order changed. Explained to patient and patient understood. Will continue to monitor patient to end of shift.

## 2012-09-18 LAB — BASIC METABOLIC PANEL
BUN: 63 mg/dL — ABNORMAL HIGH (ref 6–23)
Calcium: 9.6 mg/dL (ref 8.4–10.5)
GFR calc Af Amer: 46 mL/min — ABNORMAL LOW (ref >90)
GFR calc non Af Amer: 40 mL/min — ABNORMAL LOW (ref >90)
Potassium: 4.1 mEq/L (ref 3.5–5.1)
Sodium: 127 mEq/L — ABNORMAL LOW (ref 135–145)

## 2012-09-18 LAB — GLUCOSE, CAPILLARY
Glucose-Capillary: 259 mg/dL — ABNORMAL HIGH (ref 70–99)
Glucose-Capillary: 278 mg/dL — ABNORMAL HIGH (ref 70–99)

## 2012-09-18 MED ORDER — LOPERAMIDE HCL 2 MG PO CAPS
2.0000 mg | ORAL_CAPSULE | ORAL | Status: DC | PRN
  Administered 2012-09-18 – 2012-09-22 (×3): 2 mg via ORAL
  Filled 2012-09-18 (×3): qty 1

## 2012-09-18 MED ORDER — PYRIDOSTIGMINE BROMIDE 60 MG PO TABS
60.0000 mg | ORAL_TABLET | Freq: Every day | ORAL | Status: DC
  Administered 2012-09-18 – 2012-09-24 (×27): 60 mg via ORAL
  Filled 2012-09-18 (×35): qty 1

## 2012-09-18 NOTE — Progress Notes (Signed)
[name] stocking ordered and pt did not want put on until this morning.  After putting one [name] stocking on, he stated he was not awake enough right now to leave them on.  Stocking removed and pt. [name] he would put them on later.

## 2012-09-18 NOTE — Progress Notes (Signed)
Subjective:    Interval Events:  Patient feels well. Since he urinated quite a bit this morning. Slept well last night.     Objective:    Vital Signs:  09/17/12 1500 09/17/12 1935 09/17/12 2000 09/18/12 0547  BP: 136/70 140/80 146/79 128/62  Pulse: 72 79 80 87  Temp: 97.6 F (36.4 C)  97.5 F (36.4 C) 97.1 F (36.2 C)  TempSrc: Oral  Oral Oral  Resp: 18  18 18   Height:      Weight:    194 lb 1.6 oz (88.043 kg)  SpO2:  100% 100% 100%     Last BM Date: 09/16/12   Weights: 24-hour Weight change: -6.4 oz (-0.181 kg)  Filed Weights   09/16/12 2230 09/17/12 0535 09/18/12 0547  Weight: 195 lb 14.4 oz (88.86 kg) 195 lb 8.8 oz (88.7 kg) 194 lb 1.6 oz (88.043 kg)   Net since admission:  -0.817 kg   Intake/Output:   Gross per 24 hour  Intake   1923 ml  Output   2475 ml  Net   - 552 ml     Net since admission:  -0.6 L   Physical Exam: GENERAL: alert and oriented; resting comfortably in bed and in no distress LUNGS: clear to auscultation bilaterally, normal work of breathing HEART: normal rate; regular rhythm; normal S1 and S2, no S3 or S4 appreciated; no murmurs, rubs, or clicks ABDOMEN: soft, non-tender, normal bowel sounds, no masses palpated EXTREMITIES: 1+ edema SKIN: normal turgor    Labs: Basic Metabolic Panel: Lab 09/15/12 0913 09/16/12 1638 09/17/12 0600 09/18/12 0530  NA 122* 119* 122* 127*  K 4.6 4.5 4.3 4.1  CL 84* 82* 84* 86*  CO2 28 28 29 29   GLUCOSE 365* 331* 385* 233*  BUN 63* 60* 63* 63*  CREATININE 1.42* 1.55* 1.50* 1.69*  CALCIUM 9.4 9.4 9.4 9.6    CBG: Lab 09/15/12 1041 09/17/12 1144 09/17/12 1615 09/17/12 1938 09/18/12 0609  GLUCAP 311* 355* 395* 337* 259*      Medications:    Infusions:     Scheduled Medications: . benazepril  20 mg Oral QAC breakfast  . enoxaparin (LOVENOX) injection  40 mg Subcutaneous Q24H  . furosemide  120 mg Intravenous Once  . furosemide  80 mg Intravenous Q6H  . insulin aspart  0-5 Units  Subcutaneous QHS  . insulin aspart  0-9 Units Subcutaneous TID WC  . insulin glargine  20 Units Subcutaneous QHS  . metolazone  5 mg Oral Daily  . mirtazapine  30 mg Oral QHS  . mycophenolate  1,000 mg Oral BID  . pantoprazole  40 mg Oral BID  . predniSONE  15 mg Oral Daily  . pyridostigmine  60 mg Oral 5 X Daily  . sodium chloride  3 mL Intravenous Q12H  . zolpidem  5 mg Oral QHS     PRN Medications: sodium chloride, acetaminophen, ondansetron (ZOFRAN) IV, oxyCODONE, sodium chloride    Assessment/ Plan:    1.   Acute exacerbation of chronic diastolic CHF:  An echocardiogram from 2011 demonstrated normal systolic function with grade 2 diastolic dysfunction. The echocardiogram performed earlier this month demonstrated vigorous systolic function with an EF of 65-70% but grade 3 diastolic dysfunction.  This is complicated by near nephrotic range proteinuria secondary to diabetic nephropathy. His diuretic regimen was recently increased to furosemide 120 mg by mouth 4 times a day plus metolazone 5 mg daily. Here in the hospital, we are treating him with IV  furosemide 80 mg every 6 hours and metolazone 5 mg orally every day. - Continue IV furosemide 80 mg 4 times a day - Continue PO metolazone 5 mg daily - Strict I&Os - Daily weights  2.   Diabetic nephropathy with proteinuria:  Urine protein to creatinine ratio was measured at his last hospitalization earlier this month:  it was 2.56. This is complicating his fluid status and management of CHF. We will diurese as noted above.  3.   Hyponatremia:  This is been a problem for some time.   His sodium was 126 at discharge earlier in the month. It was up to 128 a few days ago. It was 119 at admission and now up to 127 today. We will address this by diuresing him as noted above.  4.   Stage III CKD:  Back in November and December of 2013 his creatinine was normal. It has been steadily in the 1.5-1.7 range this year. Given the findings from the  echocardiogram and the elevated urine protein-to-creatinine ratio, I think progression of diabetic nephropathy is the primary cause; he is not yet in nephrotic range proteinuria but is spilling significant protein into his urine. His creatinine has down treated with diuresis, indicating a small component of cardiorenal syndrome. We will continue to diurese and follow his creatinine closely.   5.   Chronic microcytic anemia:  Baseline hemoglobin is around 12. It is currently 10.6. Reticulocyte count previously was 2.4% with an index of 1.82, indicating low but near-normal reticulocyte production. Iron panel was normal except for a slightly elevated ferritin of 323. I think this indicates an anemia of chronic inflammation with superimposed hemodilution.  6.   Type II diabetes mellitus, uncontrolled, with complications:  Patient has persistently refused to allow [place] to treat his diabetes. Despite trying to educate him over and over again, he does not want his sugars to fall below 300.  His last hemoglobin A1c was 11.9 in February.  He refused to be insulin glargine which was ordered for last night. I will add correctional scale insulin aspart today, but I doubt he will let the nurses administer this. - Continue insulin glargine 20 units at bedtime - Continue sensitive scale correctional insulin aspart  7.   Hypertension:  In addition to diuretics, he takes benazepril 20 mg daily. Over the past year his blood pressure has averaged about 150/85. His blood pressures have been running in a similar range here in the hospital.  We will continue the benazepril here. - Continue benazepril 20 mg daily   8.   Myasthenia gravis: Managed at [facility] for this. Stable on his home regimen, which we have continued here.  - Continue mycophenolate 1000 mg twice a day  - Continue prednisone 15 mg daily  - Continue pyridostigmine 60mg  5 times daily  9.   Prophylaxis:  Enoxaparin 40 mg [place] daily  10. Disposition:  Patient's PCP  is Bard Herbert. He will require OPC followup. He will not need outpatient services. He has no transportation limitations.    Length of Stay: 2 days   Signed by:  Dorthula Rue. Earlene Plater, MD PGY-I, Internal Medicine Pager [phone]  09/18/2012, 8:04 AM

## 2012-09-19 ENCOUNTER — Other Ambulatory Visit: Payer: Self-pay

## 2012-09-19 DIAGNOSIS — I129 Hypertensive chronic kidney disease with stage 1 through stage 4 chronic kidney disease, or unspecified chronic kidney disease: Secondary | ICD-10-CM

## 2012-09-19 DIAGNOSIS — I5033 Acute on chronic diastolic (congestive) heart failure: Secondary | ICD-10-CM

## 2012-09-19 LAB — BASIC METABOLIC PANEL
CO2: 30 mEq/L (ref 19–32)
Calcium: 9.4 mg/dL (ref 8.4–10.5)
GFR calc Af Amer: 54 mL/min — ABNORMAL LOW (ref >90)
GFR calc non Af Amer: 47 mL/min — ABNORMAL LOW (ref >90)
Sodium: 126 mEq/L — ABNORMAL LOW (ref 135–145)

## 2012-09-19 LAB — GLUCOSE, CAPILLARY: Glucose-Capillary: 274 mg/dL — ABNORMAL HIGH (ref 70–99)

## 2012-09-19 MED ORDER — FUROSEMIDE 10 MG/ML IJ SOLN
100.0000 mg | Freq: Four times a day (QID) | INTRAVENOUS | Status: DC
  Administered 2012-09-19 – 2012-09-20 (×5): 100 mg via INTRAVENOUS
  Filled 2012-09-19 (×9): qty 10

## 2012-09-19 NOTE — Progress Notes (Signed)
Subjective:    Interval Events:  Patient says he slept well last night. Was able to tolerate lying flat without any troubles. He says his breathing is better.     Objective:    Vital Signs:   Filed Vitals:   09/18/12 0547 09/18/12 1300 09/18/12 2110 09/19/12 0523  BP: 128/62 149/85 146/81 118/70  Pulse: 87 68 82 79  Temp: 97.1 F (36.2 C)  97.6 F (36.4 C) 98.7 F (37.1 C)  TempSrc: Oral  Oral Oral  Resp: 18 20 18 18   Height:      Weight: 194 lb 1.6 oz (88.043 kg)   192 lb 1.6 oz (87.136 kg)  SpO2: 100% 97% 100% 100%   Last BM Date: 09/18/12   Weights: 24-hour Weight change: -2 lb (-0.907 kg)  Filed Weights   09/17/12 0535 09/18/12 0547 09/19/12 0523  Weight: 195 lb 8.8 oz (88.7 kg) 194 lb 1.6 oz (88.043 kg) 192 lb 1.6 oz (87.136 kg)   Net since admission:  -1.7 kg   Intake/Output:   Intake/Output Summary (Last 24 hours) at 09/19/12 0734 Last data filed at 09/19/12 0710  Gross per 24 hour  Intake   1083 ml  Output   2425 ml  Net  -1342 ml     Net since admission:  -2.0 L   Physical Exam: GENERAL: alert and oriented; standing in his room in no distress EYES: pupils equal, round, and reactive to light; sclera anicteric LUNGS: clear to auscultation bilaterally, normal work of breathing HEART: normal rate; regular rhythm; normal S1 and S2, no S3 or S4 appreciated; no murmurs, rubs, or clicks ABDOMEN: soft, non-tender, normal bowel sounds, no masses palpated EXTREMITIES: 1+ edema SKIN: normal turgor    Labs: Basic Metabolic Panel: Lab 09/15/12 0913 09/16/12 1638 09/17/12 0600 09/18/12 0530 09/19/12 0535  NA 122* 119* 122* 127* 126*  K 4.6 4.5 4.3 4.1 4.0  CL 84* 82* 84* 86* 86*  CO2 28 28 29 29 30   GLUCOSE 365* 331* 385* 233* 212*  BUN 63* 60* 63* 63* 62*  CREATININE 1.42* 1.55* 1.50* 1.69* 1.48*  CALCIUM 9.4 9.4 9.4 9.6 9.4    CBG: Lab 09/17/12 1938 09/18/12 0609 09/18/12 1133 09/18/12 1616 09/18/12 2100  GLUCAP 337* 259* 261* 338*  278*    Other results: EKG Results:  09/19/2012 Rate:  85 PR:  212 QRS:  142 QTc:  483 Axis:  right EKG: unchanged from previous tracings, normal sinus rhythm, RBBB, 1st degree AV block.    Medications:    Infusions:     Scheduled Medications: . benazepril  20 mg Oral QAC breakfast  . enoxaparin (LOVENOX) injection  40 mg Subcutaneous Q24H  . furosemide  120 mg Intravenous Once  . furosemide  80 mg Intravenous Q6H  . insulin aspart  0-5 Units Subcutaneous QHS  . insulin aspart  0-9 Units Subcutaneous TID WC  . insulin glargine  20 Units Subcutaneous QHS  . metolazone  5 mg Oral Daily  . mirtazapine  30 mg Oral QHS  . mycophenolate  1,000 mg Oral BID  . pantoprazole  40 mg Oral BID  . predniSONE  15 mg Oral Daily  . pyridostigmine  60 mg Oral 5 X Daily  . sodium chloride  3 mL Intravenous Q12H  . zolpidem  5 mg Oral QHS     PRN Medications: sodium chloride, acetaminophen, loperamide, ondansetron (ZOFRAN) IV, oxyCODONE, sodium chloride    Assessment/ Plan:    1.   Acute  exacerbation of chronic diastolic CHF:  An echocardiogram from 2011 demonstrated normal systolic function with grade 2 diastolic dysfunction. The echocardiogram performed earlier this month demonstrated vigorous systolic function with an EF of 65-70% but grade 3 diastolic dysfunction.  This is complicated by near nephrotic range proteinuria secondary to diabetic nephropathy. His diuretic regimen was recently increased to furosemide 120 mg by mouth 4 times a day plus metolazone 5 mg daily. Here in the hospital, we were treating him with IV furosemide 80 mg every 6 hours and metolazone 5 mg orally every day.  This seems to be working:  his weight is down 1.7 kg since admission (0.9 kg in the last 25 hours) and he is net negative 2 liters since admission. - Increase IV furosemide to 100 mg 4 times a day - Continue PO metolazone 5 mg daily - Strict I&Os - Daily weights - Daily BMET  2.   Diabetic  nephropathy with proteinuria:  Urine protein to creatinine ratio was measured at his last hospitalization earlier this month:  it was 2.56. This is complicating his fluid status and management of CHF. We will diurese as noted above.  3.   Hyponatremia:  This is been a problem for some time.   His sodium was 126 at discharge earlier in the month. It was up to 128 a few days ago. It was 119 at admission and now126 (down from 127 yesterday). We will address this by diuresing him as noted above.  4.   Stage III CKD:  Back in November and December of 2013 his creatinine was normal. It has been steadily in the 1.5-1.7 range this year. Given the findings from the echocardiogram and the elevated urine protein-to-creatinine ratio, I think progression of diabetic nephropathy is the primary cause; he is not yet in nephrotic range proteinuria but is spilling significant protein into his urine. His creatinine has down treated with diuresis, indicating a small component of cardiorenal syndrome. We will continue to diurese and follow his creatinine closely.  Down to 1.48 today from 1.69 yesterday.  5.   Chronic microcytic anemia:  Baseline hemoglobin is around 12. It is currently 10.6. Reticulocyte count previously was 2.4% with an index of 1.82, indicating low but near-normal reticulocyte production. Iron panel was normal except for a slightly elevated ferritin of 323. I think this indicates an anemia of chronic inflammation with superimposed hemodilution.  6.   Type II diabetes mellitus, uncontrolled, with complications:  Patient has persistently refused to allow [place] to treat his diabetes. Despite trying to educate him over and over again, he does not want his sugars to fall below 300.  His last hemoglobin A1c was 11.9 in February.  He refused to be insulin glargine which was ordered for last night. I will add correctional scale insulin aspart today, but I doubt he will let the nurses administer this. - Continue insulin  glargine 20 units at bedtime - Continue sensitive scale correctional insulin aspart  7.   Hypertension:  In addition to diuretics, he takes benazepril 20 mg daily. Over the past year his blood pressure has averaged about 150/85. His blood pressures have been running in a similar range here in the hospital.  We will continue the benazepril here. - Continue benazepril 20 mg daily   8.   Myasthenia gravis: Managed at [facility] for this. Stable on his home regimen, which we have continued here.  - Continue mycophenolate 1000 mg twice a day  - Continue prednisone 15 mg daily  -  Continue pyridostigmine 60mg  5 times daily  9.   Prophylaxis:  Enoxaparin 40 mg [place] daily  10. Disposition:  Patient's PCP is [facility] Herbert. He will require OPC followup. He will not need outpatient services. He has no transportation limitations.    Length of Stay: 3 days   Signed by:  Dorthula Rue. Earlene Plater, MD PGY-I, Internal Medicine Pager [phone]  09/19/2012, 10:57 AM

## 2012-09-19 NOTE — Progress Notes (Signed)
INTERNAL MEDICINE TEACHING SERVICE Attending Note  Date: 09/19/2012  Patient name: [name]  Medical record number: [id number]  Date of birth: 1946/05/16    This patient has been seen and discussed with the house staff. Please see their note for complete details. I concur with their findings with the following additions/corrections: He states he is having trouble sleeping due to interruptions. He gives a mixed answer, but appears to be breathing better and tolerating lying flat in bed.  His weight is down about 1.7 kg since admission. His renal function is stable. He states he is taking his home dose of diuretics, but perhaps again eating too much salt. He has HFPEF with grade 3 diastolic dysfunction and perhaps near nephrotic range proteinuria.  I agree with increasing lasix to 100 mg IV q6hrs and using metolazone. There may be some component of diuretic resistance. I would ask Cardiology for their opinion on this case. Strict I/O's. Daily weights as you are doing. Follow his BMP + Mg at least twice daily.   The treatment plan was discussed in detail with the patient.  Alternatives to treatment, side effects, risks and benefits, and complications were discussed with the patient. Informed consent was obtained. The patient agrees to proceed with the current treatment plan.  Jonah Blue, DO  09/19/2012, 12:50 PM

## 2012-09-19 NOTE — Care Management Note (Signed)
    Page 1 of 1   09/19/2012     11:31:56 AM   CARE MANAGEMENT NOTE 09/19/2012  Patient:  [name], [name]   Account Number:  [id number]  Date Initiated:  09/19/2012  Documentation initiated by:  Oletta Cohn  Subjective/Objective Assessment:   67 year old man with past medical history most significant for chronic heart failure with preserved ejection fraction, uncontrolled diabetes, hypertension and hx of med and dietary noncompliance comes back w/ worsening sob and leg swelling     Action/Plan:   home alone/ home with [facility]   Anticipated DC Date:  09/22/2012   Anticipated DC Plan:  HOME W HOME HEALTH SERVICES      DC Planning Services  CM consult      Choice offered to / List presented to:  C-1 Patient        HH arranged  HH - 11 Patient Refused      Status of service:   Medicare Important Message given?   (If response is "NO", the following Medicare IM given date fields will be blank) Date Medicare IM given:   Date Additional Medicare IM given:    Discharge Disposition:    Per UR Regulation:  Reviewed for med. necessity/level of care/duration of stay  If discussed at Long Length of Stay Meetings, dates discussed:    Comments:  09/19/12 Oletta Cohn, RN, BSN, NCM [phone] Spoke with pt regarding discharge planning.  CM offered [facility] list of [facility] agencies.  Pt refuses HH services at this time.

## 2012-09-19 NOTE — Progress Notes (Signed)
I have discussed this case with Dr. Pribula, read the documentation and I agree with the plan of care. Please see the resident note for details of management. 

## 2012-09-19 NOTE — Progress Notes (Signed)
Pt's cbg for 1100 was 274, Pt refused his insulin dose.  Pt states he gets sick if his CBG drops below 300.  MD is aware, will continue to monitor.

## 2012-09-19 NOTE — Progress Notes (Signed)
Patient refused CBG check and insulin.

## 2012-09-19 NOTE — Progress Notes (Signed)
Dr. Shirlee Latch made aware that patient is possibly going from a junctional rhythm to sinus rhythm.  Pt. Asymptomatic.  EKG obtained per order that shows sinus rhythm with first degree AV block.  Will continue to monitor patient.

## 2012-09-19 NOTE — Progress Notes (Signed)
Utilization Review Completed Abel Ra J. Excell Neyland, RN, BSN, NCM [phone]  

## 2012-09-19 NOTE — Progress Notes (Signed)
Inpatient Diabetes Program Recommendations  AACE/ADA: New Consensus Statement on Inpatient Glycemic Control (2013)  Target Ranges:  Prepandial:   less than 140 mg/dL      Peak postprandial:   less than 180 mg/dL (1-2 hours)      Critically ill patients:  140 - 180 mg/dL   Inpatient Diabetes Program Recommendations Insulin - Basal: Consider decreasing Lantus to 10 units (1/2 home dose)    Note: Patient is refusing insulin.  Maybe decreasing the dose will make him feel more comfortable that he will not have a hypoglycemic event.  Also, patient takes Novolog 5 units TID at home.  Consider ordering the Novolog 5 units TID and maybe he will take 5 units if not the correction scale. Will follow. Thank you  Piedad Climes BSN, RN,CDE Inpatient Diabetes Coordinator [phone] (team pager)

## 2012-09-20 ENCOUNTER — Ambulatory Visit: Payer: Medicare Other | Admitting: Radiation Oncology

## 2012-09-20 DIAGNOSIS — I499 Cardiac arrhythmia, unspecified: Secondary | ICD-10-CM

## 2012-09-20 DIAGNOSIS — N19 Unspecified kidney failure: Secondary | ICD-10-CM

## 2012-09-20 LAB — RENAL FUNCTION PANEL
Albumin: 2.7 g/dL — ABNORMAL LOW (ref 3.5–5.2)
BUN: 62 mg/dL — ABNORMAL HIGH (ref 6–23)
Calcium: 9 mg/dL (ref 8.4–10.5)
Creatinine, Ser: 1.56 mg/dL — ABNORMAL HIGH (ref 0.50–1.35)
Glucose, Bld: 334 mg/dL — ABNORMAL HIGH (ref 70–99)
Phosphorus: 4.8 mg/dL — ABNORMAL HIGH (ref 2.3–4.6)
Potassium: 3.8 mEq/L (ref 3.5–5.1)

## 2012-09-20 LAB — GLUCOSE, CAPILLARY
Glucose-Capillary: 273 mg/dL — ABNORMAL HIGH (ref 70–99)
Glucose-Capillary: 305 mg/dL — ABNORMAL HIGH (ref 70–99)

## 2012-09-20 MED ORDER — MILRINONE IN DEXTROSE 20 MG/100ML IV SOLN
0.2500 ug/kg/min | INTRAVENOUS | Status: DC
  Administered 2012-09-20 – 2012-09-24 (×6): 0.25 ug/kg/min via INTRAVENOUS
  Filled 2012-09-20 (×6): qty 100

## 2012-09-20 MED ORDER — FUROSEMIDE 10 MG/ML IJ SOLN
30.0000 mg/h | INTRAVENOUS | Status: DC
  Administered 2012-09-20 – 2012-09-21 (×3): 20 mg/h via INTRAVENOUS
  Administered 2012-09-22 – 2012-09-24 (×5): 30 mg/h via INTRAVENOUS
  Filled 2012-09-20 (×17): qty 25

## 2012-09-20 MED ORDER — PYRIDOSTIGMINE BROMIDE 60 MG PO TABS
60.0000 mg | ORAL_TABLET | Freq: Once | ORAL | Status: AC
  Administered 2012-09-20: 60 mg via ORAL
  Filled 2012-09-20: qty 1

## 2012-09-20 MED ORDER — ALUM & MAG HYDROXIDE-SIMETH 200-200-20 MG/5ML PO SUSP: 30.0000 mL | Freq: Four times a day (QID) | ORAL | Status: DC | PRN

## 2012-09-20 NOTE — Progress Notes (Signed)
Subjective:    Interval Events:  Patient slept well. No dyspnea or orthopnea.  Says his IV pump broke overnight and he received several hours of normal saline.  I do not see that recorded in his I&Os.  He received only 1443 mL in yesterday with only 3 mL IV.      Objective:    Vital Signs:   Filed Vitals:   09/19/12 0523 09/19/12 1432 09/19/12 2032 09/19/12 2033  BP: 118/70 141/92 156/103 157/73  Pulse: 79 79 92   Temp: 98.7 F (37.1 C) 97.9 F (36.6 C) 98.7 F (37.1 C)   TempSrc: Oral Oral Oral   Resp: 18 18 18    Height:      Weight: 192 lb 1.6 oz (87.136 kg)     SpO2: 100% 99% 94% 100%   Last BM Date: 09/18/12   Weights: Filed Weights   09/17/12 0535 09/18/12 0547 09/19/12 0523  Weight: 195 lb 8.8 oz (88.7 kg) 194 lb 1.6 oz (88.043 kg) 192 lb 1.6 oz (87.136 kg)    Intake/Output:   Gross per 24 hour  Intake   1443 mL  Output   1950 mL  Net   -507 mL     Net since admission:  -2.0 L   Physical Exam: GENERAL: alert and oriented; standing in his room in no distress LUNGS: clear to auscultation bilaterally, normal work of breathing HEART: irregular rhythm, normal rate; 2/6 systolic murmur heard best at the upper sternal borders; no diastolic murmur ABDOMEN: soft, non-tender, normal bowel sounds, no masses palpated EXTREMITIES: 1+ edema SKIN: normal turgor    Labs: Basic Metabolic Panel: Lab 09/16/12 1638 09/17/12 0600 09/18/12 0530 09/19/12 0535 09/20/12 0450  NA 119* 122* 127* 126* 127*  K 4.5 4.3 4.1 4.0 3.8  CL 82* 84* 86* 86* 88*  CO2 28 29 29 30 28   GLUCOSE 331* 385* 233* 212* 334*  BUN 60* 63* 63* 62* 62*  CREATININE 1.55* 1.50* 1.69* 1.48* 1.56*  CALCIUM 9.4 9.4 9.6 9.4 9.0 (10.0)  MG  --   --   --   --  1.9  PHOS  --   --   --   --  4.8*  ALBUMIN     2.7*    CBG: Lab 09/18/12 0609 09/18/12 1133 09/18/12 1616 09/18/12 2100 09/19/12 1129  GLUCAP 259* 261* 338* 278* 274*       Medications:    Infusions:     Scheduled  Medications: . benazepril  20 mg Oral QAC breakfast  . enoxaparin (LOVENOX) injection  40 mg Subcutaneous Q24H  . furosemide  100 mg Intravenous Q6H  . insulin aspart  0-5 Units Subcutaneous QHS  . insulin aspart  0-9 Units Subcutaneous TID WC  . insulin glargine  20 Units Subcutaneous QHS  . metolazone  5 mg Oral Daily  . mirtazapine  30 mg Oral QHS  . mycophenolate  1,000 mg Oral BID  . pantoprazole  40 mg Oral BID  . predniSONE  15 mg Oral Daily  . pyridostigmine  60 mg Oral 5 X Daily  . sodium chloride  3 mL Intravenous Q12H  . zolpidem  5 mg Oral QHS     PRN Medications: sodium chloride, acetaminophen, loperamide, ondansetron (ZOFRAN) IV, oxyCODONE, sodium chloride    Assessment/ Plan:    1.   Acute exacerbation of chronic diastolic CHF:  An echocardiogram from 2011 demonstrated normal systolic function with grade 2 diastolic dysfunction. The echocardiogram performed earlier this month  demonstrated vigorous systolic function with an EF of 65-70% but grade 3 diastolic dysfunction.  This is complicated by near nephrotic range proteinuria secondary to diabetic nephropathy. His diuretic regimen was recently increased to furosemide 120 mg by mouth 4 times a day plus metolazone 5 mg daily. Here in the hospital, we were treating him with IV furosemide 100 mg every 6 hours and metolazone 5 mg orally every day.  This seems to be working slowly:  He is net negative 500 mL over the past 24 hours and net negative 2 L since admission. - Continue IV furosemide 100 mg 4 times a day - Continue PO metolazone 5 mg daily - Strict I&Os - Daily weights - Daily BMET - Heart failure team consulted - EKG for irregular rhythm noted on exam  2.   Diabetic nephropathy with proteinuria:  Urine protein to creatinine ratio was measured at his last hospitalization earlier this month:  it was 2.56. This is complicating his fluid status and management of CHF. We will diurese as noted above.  3.    Hyponatremia:  This is been a problem for some time.   His sodium was 126 at discharge earlier in the month. It was up to 128 a few days ago. It was 119 at admission and now 127.  4.   Stage III CKD:  Back in November and December of 2013 his creatinine was normal. It has been steadily in the 1.5-1.7 range this year. Given the findings from the echocardiogram and the elevated urine protein-to-creatinine ratio, I think progression of diabetic nephropathy is the primary cause; he is not yet in nephrotic range proteinuria but is spilling significant protein into his urine. His creatinine has down treated with diuresis, indicating a small component of cardiorenal syndrome. Today his creatinine increased from 1.48 to 1.56.  5.   Chronic microcytic anemia:  Baseline hemoglobin is around 12. It is currently 10.6. Reticulocyte count previously was 2.4% with an index of 1.82, indicating low but near-normal reticulocyte production. Iron panel was normal except for a slightly elevated ferritin of 323. I think this indicates an anemia of chronic inflammation with superimposed hemodilution.  6.   Type II diabetes mellitus, uncontrolled, with complications:  Patient has persistently refused to allow [place] to treat his diabetes. Despite trying to educate him over and over again, he does not want his sugars to fall below 300.  His last hemoglobin A1c was 11.9 in February.  He refused to be insulin glargine which was ordered for last night. I will add correctional scale insulin aspart today, but I doubt he will let the nurses administer this. - Continue insulin glargine 20 units at bedtime - Continue sensitive scale correctional insulin aspart  7.   Hypertension:  In addition to diuretics, he takes benazepril 20 mg daily. Over the past year his blood pressure has averaged about 150/85. His blood pressures have been running in a similar range here in the hospital.  We will continue the benazepril here. - Continue benazepril  20 mg daily   8.   Myasthenia gravis: Managed at [facility] for this. Stable on his home regimen, which we have continued here.  - Continue mycophenolate 1000 mg twice a day  - Continue prednisone 15 mg daily  - Continue pyridostigmine 60mg  5 times daily  9.   Prophylaxis:  Enoxaparin 40 mg [place] daily  10. Disposition:  Patient's PCP is Bard Herbert. He will require OPC followup. He will not need outpatient services. He has  no transportation limitations.    Length of Stay: 4 days   Signed by:  Dorthula Rue. Earlene Plater, MD PGY-I, Internal Medicine Pager [phone]  09/20/2012, 8:26 AM

## 2012-09-20 NOTE — Consult Note (Addendum)
Advanced Heart Failure Team Consult Note  Referring Physician: Dr Earlene Plater Primary Physician: Dr Tonny Branch Primary Cardiologist:  Dr Herbert Deaner Neurologist: Dr Pearlean Brownie  Reason for Consultation: Acute  Diastolic Heart Failure HPI:    Mr Golphin is 67 year old male with PMH of chronic diastolic heart failure, myasthenia gravis, DM, HTN, HL, RBBB and LAFB, and noncompliacne. He has been followed closelyy by Dr. Freeman Caldron in [facility].  He has also had sinus node modification/ATach ablation 08/2011. Notes from [facility] recently indicate LV dysfunction with reported EF 45%. LHC at [facility] 3/13: pLAD 20%, dLAD 20%, mRCA 20%, PDA 20%. Nuc 12/12: no scar or ischemia, EF 42%. He is not on  beta blockers due to MG. ECHO 09/06/12 EF 65-70%  ECHO 09/06/12 EF 65-70%  He has a long h/o noncompliance with medical advice. On 09/03/12 he presented to [facility] ED with dyspnea and lower extremity edema.  He signed out AMA despite hyponatremia.  Admitted to [facility] 09/05/12 with dyspnea and lower extremity edema. Diuresed with IV lasix. Discharge weight 187 pounds same as admit. In Feburuay was in the 160s but he says this was due to GI problems.   Prior to admit he reported increased dyspnea on exertion and lower extremity edema. Sleeping in a chair. + Orthopnea. He reports that he has been drinking > than 2 liters of fluid and eating high salt foods such as Doritos.   Readmitted 09/17/12 due to increased dyspnea and lower extremity edema despite increased diuretics at home 120 mg lasix every 6 hours. Once admitted he was placed on 60 mg IV lasix every 6 hours as well as Metolazone 5 mg 30 min before lasix. Admit weight 194 pounds. Pertinent admission labs included Pro BNP 2865, K 3.8, Creatinine 1.56, and Na 127. Sluggish diuresis. Lasix increased to 120 mg IV every 6 hours with lasix. Weight only down 2 pounds.   Creatinine 1.5>1.69>1.48>1.56   Denies SOB at rest. + Orthopnea.     Review of Systems: [y] = yes, [ ]  = no   General:  Weight gain [Y ]; Weight loss [ ] ; Anorexia [ ] ; Fatigue [ Y]; Fever [ ] ; Chills [ ] ; Weakness [ ]   Cardiac: Chest pain/pressure [ ] ; Resting SOB [ ] ; Exertional SOB [Y ]; Orthopnea [Y ]; Pedal Edema [Y ]; Palpitations [ ] ; Syncope [ ] ; Presyncope [ ] ; Paroxysmal nocturnal dyspnea[ ]   Pulmonary: Cough [ ] ; Wheezing[ ] ; Hemoptysis[ ] ; Sputum [ ] ; Snoring [ ]   GI: Vomiting[ ] ; Dysphagia[ ] ; Melena[ ] ; Hematochezia [ ] ; Heartburn[ ] ; Abdominal pain [ ] ; Constipation [ ] ; Diarrhea [ ] ; BRBPR [ ]   GU: Hematuria[ ] ; Dysuria [ ] ; Nocturia[ ]   Vascular: Pain in legs with walking [ ] ; Pain in feet with lying flat [ ] ; Non-healing sores [ ] ; Stroke [ ] ; TIA [ ] ; Slurred speech [ ] ;  Neuro: Headaches[ ] ; Vertigo[ ] ; Seizures[ ] ; Paresthesias[ ] ;Blurred vision [ ] ; Diplopia [ ] ; Vision changes [ ]   Ortho/Skin: Arthritis [ ] ; Joint pain [Y ]; Muscle pain [Y; Joint swelling [Y ]; Back Pain [ ] ; Rash [ ]   Psych: Depression[ ] ; Anxiety[ ]   Heme: Bleeding problems [ ] ; Clotting disorders [ ] ; Anemia [ ]   Endocrine: Diabetes [ Y]; Thyroid dysfunction[ ]   Home Medications Prior to Admission medications   Medication Sig Start Date End Date Taking? Authorizing Provider  benazepril (LOTENSIN) 20 MG tablet Take 1 tablet (20 mg total) by mouth daily before breakfast. 09/09/12  Yes  Leodis Sias, MD  furosemide (LASIX) 40 MG tablet Take 40 mg by mouth 4 (four) times daily.   Yes Historical Provider, MD  furosemide (LASIX) 80 MG tablet Take 1 tablet (80 mg total) by mouth 4 (four) times daily. 09/09/12  Yes Leodis Sias, MD  insulin aspart (NOVOLOG) 100 UNIT/ML injection Take 5 units with each meal three times daily. 09/09/12  Yes Leodis Sias, MD  insulin glargine (LANTUS SOLOSTAR) 100 UNIT/ML injection Inject 0.2 mLs (20 Units total) into the skin at bedtime. 09/09/12  Yes Leodis Sias, MD  metolazone (ZAROXOLYN) 5 MG tablet Take 1 tablet (5 mg total) by mouth daily. 09/15/12  Yes Leodis Sias,  MD  mirtazapine (REMERON) 30 MG tablet Take 1 tablet (30 mg total) by mouth at bedtime. 08/08/12  Yes Leodis Sias, MD  mycophenolate (CELLCEPT) 500 MG tablet Take 1,000 mg by mouth 2 (two) times daily.    Yes Historical Provider, MD  oxyCODONE (OXY IR/ROXICODONE) 5 MG immediate release tablet Take 1 tablet (5 mg total) by mouth every 4 (four) hours as needed. For pain 11/11/12 12/11/12 Yes Leodis Sias, MD  pantoprazole (PROTONIX) 40 MG tablet Take 1 tablet (40 mg total) by mouth daily. 07/14/12  Yes Leodis Sias, MD  predniSONE (DELTASONE) 5 MG tablet Take 15 mg by mouth daily.   Yes Historical Provider, MD  pyridostigmine (MESTINON) 60 MG tablet Take 60 mg by mouth 5 (five) times daily. Medication starts to wear off closer to 2.5 hours 12/16/11  Yes Duke Salvia, MD  zolpidem (AMBIEN) 10 MG tablet Take 5 mg by mouth at bedtime. 05/12/12  Yes Leodis Sias, MD  Blood Glucose Monitoring Suppl (FREESTYLE FREEDOM) KIT 1 each by Does not apply route as needed. 09/12/12   Historical Provider, MD  Glucose Blood (FREESTYLE LITE TEST VI) 1 each 4 (four) times daily. 09/09/12   Historical Provider, MD  Insulin Pen Needle (PEN NEEDLES) 31G X 6 MM MISC Inject 1 each into the skin daily. 09/09/12   Leodis Sias, MD    Past Medical History: Past Medical History  Diagnosis Date  . Sinus tachycardia 2006    a. Nonobstructive CAD by cath 2013. b. s/p atrial tachycardia ablation 08/2011 (sinus node modification)  . Orthostatic hypotension   . Unspecified essential hypertension   . Hyperlipidemia   . Cervical spondylosis with radiculopathy   . Syrinx     T5-T6  . Cord compression     Cord compression syndrome C5-C6, C6-C7  . ADD (attention deficit disorder)   . Decreased libido   . Depression   . Diabetes mellitus type 2, uncontrolled, with complications     CAD, orthostatic hypotension, erectile dysfunction  . Pulmonary nodule, left     Evaluated 04/2011 by pulmonology - felt to  be benign granuloma  . Peripheral edema   . Coronary artery disease, non-occlusive     Mild nonobstructive CAD by cath 08/2011  . Systolic congestive heart failure     By echo 2013 at [facility] - reported EF 45% per pt  . Colitis     with surface exudate  . Hemorrhoids     small  . Gastritis   . Clostridium difficile infection 02/15/2012    causing pseudomembranosu colitis  . Diastolic heart failure     grade 2 per echocardiogram (2011)  . Right bundle branch block and left posterior fascicular block   . Sleep apnea     occassionally uses cpap  . Myasthenia gravis  Status post thymectomy April 2012, hx of plasmapheresis    Past Surgical History: Past Surgical History  Procedure Laterality Date  . Thymectomy  april 2012  . Colonoscopy  11/13/2011    Procedure: COLONOSCOPY;  Surgeon: Theda Belfast, MD;  Location: [facility];  Service: Endoscopy;  Laterality: N/A;  . Cardiac catheterization    . Esophagogastroduodenoscopy  05/13/2012    Procedure: ESOPHAGOGASTRODUODENOSCOPY (EGD);  Surgeon: Iva Boop, MD;  Location: [facility];  Service: Endoscopy;  Laterality: N/A;  needs PAT w/ dx of Myastenia gravis and multiple meds. Pt requested lidocaine to numb IV site prior to start.  Very needle phobic.    Family History: Family History  Problem Relation Age of Onset  . Breast cancer Mother   . Other Father     Beck's Sarcoid  . Colon cancer Neg Hx   . Prostate cancer Neg Hx     Social History: History   Social History  . Marital Status: Single    Spouse Name: N/A    Number of Children: 1  . Years of Education: N/A   Occupational History  . Retired     Surveyor, minerals   Social History Main Topics  . Smoking status: Former Smoker -- 2.50 packs/day for 30 years    Quit date: 06/09/1991  . Smokeless tobacco: Never Used  . Alcohol Use: No  . Drug Use: No  . Sexually Active: None   Other Topics Concern  . None   Social History Narrative   Has a single man's lifestyle  and will remind anyone of it when asked.   Prefers to see only male physicians   Retired Surveyor, minerals      Lives at home with mother (71 y.o)    Allergies:  Allergies  Allergen Reactions  . Aminoglycosides     MG patient  . Beta Adrenergic Blockers Other (See Comments)    MG patient  . Calcium Channel Blockers Other (See Comments)    MG patient  . Metoprolol Other (See Comments)    MG patient  . Neuromuscular Blocking Agents Other (See Comments)    MG patient  . Other Other (See Comments)    Myasthenia Gravis patient  . Penicillins Other (See Comments)    MG  . Quinine Derivatives Other (See Comments)    MG patient    Objective:    Vital Signs:   Temp:  [98.7 F (37.1 C)] 98.7 F (37.1 C) (04/15 1423) Pulse Rate:  [76-92] 76 (04/15 1423) Resp:  [18] 18 (04/15 1423) BP: (132-157)/(72-103) 132/72 mmHg (04/15 1423) SpO2:  [94 %-100 %] 100 % (04/15 1423) Last BM Date: 09/18/12  Weight change: Filed Weights   09/17/12 0535 09/18/12 0547 09/19/12 0523  Weight: 195 lb 8.8 oz (88.7 kg) 194 lb 1.6 oz (88.043 kg) 192 lb 1.6 oz (87.136 kg)    Intake/Output:   Intake/Output Summary (Last 24 hours) at 09/20/12 1545 Last data filed at 09/20/12 1422  Gross per 24 hour  Intake   1303 ml  Output   2250 ml  Net   -947 ml     Physical Exam: General:  Elderly Chronically ill appearing.  No resp difficulty HEENT: normal Neck: supple. JVP 8-10 . Carotids 2+ bilat; no bruits. No lymphadenopathy or thryomegaly appreciated. Cor: PMI nondisplaced. Regular rate & rhythm. Frequent ectopy No rubs, gallops or murmurs. Lungs: clear Abdomen: soft, nontender, distended. No hepatosplenomegaly. No bruits or masses. Good bowel sounds. Extremities: no cyanosis, clubbing, rash, R and  LLE 3+ lower edema Neuro: alert & orientedx3, cranial nerves grossly intact. moves all 4 extremities w/o difficulty. Affect pleasant  Telemetry:SR  Labs: Basic Metabolic Panel:  Recent Labs Lab  09/16/12 1638 09/17/12 0600 09/18/12 0530 09/19/12 0535 09/20/12 0450  NA 119* 122* 127* 126* 127*  K 4.5 4.3 4.1 4.0 3.8  CL 82* 84* 86* 86* 88*  CO2 28 29 29 30 28   GLUCOSE 331* 385* 233* 212* 334*  BUN 60* 63* 63* 62* 62*  CREATININE 1.55* 1.50* 1.69* 1.48* 1.56*  CALCIUM 9.4 9.4 9.6 9.4 9.0  MG  --   --   --   --  1.9  PHOS  --   --   --   --  4.8*    Liver Function Tests:  Recent Labs Lab 09/20/12 0450  ALBUMIN 2.7*   No results found for this basename: LIPASE, AMYLASE,  in the last 168 hours No results found for this basename: AMMONIA,  in the last 168 hours  CBC:  Recent Labs Lab 09/16/12 1638  WBC 7.8  NEUTROABS 5.7  HGB 10.6*  HCT 28.8*  MCV 76.2*  PLT 300    Cardiac Enzymes: No results found for this basename: CKTOTAL, CKMB, CKMBINDEX, TROPONINI,  in the last 168 hours  BNP: BNP (last 3 results)  Recent Labs  09/03/12 1656 09/05/12 1920 09/16/12 1639  PROBNP 2552.0* 2519.0* 2865.0*    CBG:  Recent Labs Lab 09/18/12 1133 09/18/12 1616 09/18/12 2100 09/19/12 1129 09/20/12 1138  GLUCAP 261* 338* 278* 274* 273*    Coagulation Studies: No results found for this basename: LABPROT, INR,  in the last 72 hours  Other results: EKG:  Imaging:  No results found.   Medications:     Current Medications: . benazepril  20 mg Oral QAC breakfast  . enoxaparin (LOVENOX) injection  40 mg Subcutaneous Q24H  . furosemide  100 mg Intravenous Q6H  . insulin aspart  0-5 Units Subcutaneous QHS  . insulin aspart  0-9 Units Subcutaneous TID WC  . insulin glargine  20 Units Subcutaneous QHS  . metolazone  5 mg Oral Daily  . mirtazapine  30 mg Oral QHS  . mycophenolate  1,000 mg Oral BID  . pantoprazole  40 mg Oral BID  . predniSONE  15 mg Oral Daily  . pyridostigmine  60 mg Oral 5 X Daily  . sodium chloride  3 mL Intravenous Q12H  . zolpidem  5 mg Oral QHS     Infusions:      Assessment:  1. A/C diastolic heart failure  EF 65%  09/06/2012 2. Hyponatremia 3. Myasthenia Gravis 4. DM II - uncontrolled HGB A1C 11.9 07/2012 5. A/C renal failure (creatinine baseline 1.3) 6. Noncompliance 7. Sleep Apnea- occasional CPAP  8. Cardiorenal Syndrome   Plan/Discussion:    Mr Talarico is a 67 year old with a complicated PMH. He was readmitted with acute diastolic heart failure in the setting dietary and fluid non-complinace. Marked volume overload despite IV lasix 120 mg every 6 hours and Metolazone 5 mg daily. Weight up 20-30 pounds. Renal function trending up from baseline (1.3). Start Lasix drip at 20 mg per hour and Milrinone at 0.25 mcg . Follow renal function closely.   Consult dietitian and cardiac rehab.   Length of Stay: 4  CLEGG,AMY 09/20/2012, 3:45 PM  Patient seen and examined with Tonye Becket, NP. We discussed all aspects of the encounter. I agree with the assessment and plan as stated above.  This is a very difficult situation. Mr Gallo has significant recurrent volume overload in the setting of diastolic HF, diabetic nephropathy and dietary noncompliance. He has been threatening to leave AMA again tomorrow. Despite his arguments otherwise, in discussing his case with the [facility] team he has never followed medical advice for his HF and creates continuuous obstacles for his treating team which leads to fractured care and poor outcomes. I was very frank with him about this and told him I would only participate in his inpatient care as long as he followed directions and I would likely not participate in his outpatient care unless I saw a consistent and significant change in his approach.   I think he has at least 15 pounds of fluid on board which is not mobilizing despite high-dose bolus lasix. I have placed [name] hose on him personally and we will start lasix gtt at 20 and milrinone to reduce his pulmonary pressures and hopefully promote forward flow. We also discussed the possibility of ultrafiltration but we have decided to  pursue milrinone/lasix regimen first.   I will follow for now.  Khanh Cordner,MD 5:06 PM

## 2012-09-20 NOTE — Progress Notes (Signed)
INTERNAL MEDICINE TEACHING SERVICE Attending Note  Date: 09/20/2012  Patient name: [name]  Medical record number: [id number]  Date of birth: [birth year]    This patient has been seen and discussed with the house staff. Please see their note for complete details. I concur with their findings with the following additions/corrections: This morning I observed him lying flat in bed without difficulty. I/O's overnight were not accurate.  UOP was recorded as 1950.   I would follow his BMP, Mg twice daily while diuresing him and correct K, Mg as needed. Obtain EKG due to noted irregular rhythm today, asymptomatic. Consult HF team. May need to change to Bumex. I agree his near nephrotic range proteinuria may be contributing here. He does not allow [place] to treat his DM appropriately, he understands complications of this as well as damage already done (CKD).  The treatment plan was discussed in detail with the patient.  Alternatives to treatment, side effects, risks and benefits, and complications were discussed with the patient. Informed consent was obtained. The patient agrees to proceed with the current treatment plan.  Jonah Blue, DO  09/20/2012, 1:36 PM

## 2012-09-20 NOTE — Progress Notes (Signed)
Pt refusing insulin state"I prefer for my blood sugar to run in the 300s and I will not take any insulin."

## 2012-09-21 LAB — BASIC METABOLIC PANEL
CO2: 28 mEq/L (ref 19–32)
Calcium: 9.4 mg/dL (ref 8.4–10.5)
Creatinine, Ser: 1.67 mg/dL — ABNORMAL HIGH (ref 0.50–1.35)
GFR calc non Af Amer: 40 mL/min — ABNORMAL LOW (ref >90)

## 2012-09-21 LAB — GLUCOSE, CAPILLARY
Glucose-Capillary: 414 mg/dL — ABNORMAL HIGH (ref 70–99)
Glucose-Capillary: 514 mg/dL — ABNORMAL HIGH (ref 70–99)

## 2012-09-21 MED ORDER — PYRIDOSTIGMINE BROMIDE 60 MG PO TABS
60.0000 mg | ORAL_TABLET | Freq: Once | ORAL | Status: AC
  Administered 2012-09-21: 60 mg via ORAL
  Filled 2012-09-21: qty 1

## 2012-09-21 MED ORDER — INSULIN ASPART 100 UNIT/ML ~~LOC~~ SOLN
9.0000 [IU] | Freq: Once | SUBCUTANEOUS | Status: AC
  Administered 2012-09-21: 9 [IU] via SUBCUTANEOUS

## 2012-09-21 MED ORDER — POTASSIUM CHLORIDE CRYS ER 20 MEQ PO TBCR: 40.0000 meq | EXTENDED_RELEASE_TABLET | Freq: Once | ORAL | Status: DC

## 2012-09-21 NOTE — Progress Notes (Signed)
Advanced Heart Failure Rounding Note   Subjective:     Started on milrinone and lasix gtt yesterday. Urine output seems to be picking up. Denies dyspnea or orthopnea. Weight down 2 pounds. Creatinine slightly worse.   Objective:   Weight Range:  Vital Signs:   Temp:  [98 F (36.7 C)-98.7 F (37.1 C)] 98 F (36.7 C) (04/16 0953) Pulse Rate:  [76-92] 92 (04/16 0953) Resp:  [18-20] 20 (04/16 0953) BP: (112-132)/(56-72) 127/63 mmHg (04/16 0953) SpO2:  [97 %-100 %] 98 % (04/16 0953) Weight:  [86.5 kg (190 lb 11.2 oz)] 86.5 kg (190 lb 11.2 oz) (04/16 0439) Last BM Date: 09/20/12  Weight change: Filed Weights   09/18/12 0547 09/19/12 0523 09/21/12 0439  Weight: 88.043 kg (194 lb 1.6 oz) 87.136 kg (192 lb 1.6 oz) 86.5 kg (190 lb 11.2 oz)    Intake/Output:   Intake/Output Summary (Last 24 hours) at 09/21/12 1024 Last data filed at 09/21/12 0700  Gross per 24 hour  Intake   1020 ml  Output   2425 ml  Net  -1405 ml     Physical Exam:  General: Elderly Chronically ill appearing. No resp difficulty  HEENT: normal  Neck: supple. JVP 8-10 . Carotids 2+ bilat; no bruits. No lymphadenopathy or thryomegaly appreciated.  Cor: PMI nondisplaced. Regular rate & rhythm. Frequent ectopy No rubs, gallops or murmurs.  Lungs: clear  Abdomen: soft, nontender, distended. No hepatosplenomegaly. No bruits or masses. Good bowel sounds.  Extremities: no cyanosis, clubbing, rash, R and LLE 2-3+ lower edema +[name] hose Neuro: alert & orientedx3, cranial nerves grossly intact. moves all 4 extremities w/o difficulty. Affect pleasant  Telemetry:SR   Labs: Basic Metabolic Panel:  Recent Labs Lab 09/17/12 0600 09/18/12 0530 09/19/12 0535 09/20/12 0450 09/21/12 0440  NA 122* 127* 126* 127* 127*  K 4.3 4.1 4.0 3.8 3.7  CL 84* 86* 86* 88* 87*  CO2 29 29 30 28 28   GLUCOSE 385* 233* 212* 334* 376*  BUN 63* 63* 62* 62* 60*  CREATININE 1.50* 1.69* 1.48* 1.56* 1.67*  CALCIUM 9.4 9.6 9.4 9.0 9.4   MG  --   --   --  1.9  --   PHOS  --   --   --  4.8*  --     Liver Function Tests:  Recent Labs Lab 09/20/12 0450  ALBUMIN 2.7*   No results found for this basename: LIPASE, AMYLASE,  in the last 168 hours No results found for this basename: AMMONIA,  in the last 168 hours  CBC:  Recent Labs Lab 09/16/12 1638  WBC 7.8  NEUTROABS 5.7  HGB 10.6*  HCT 28.8*  MCV 76.2*  PLT 300    Cardiac Enzymes: No results found for this basename: CKTOTAL, CKMB, CKMBINDEX, TROPONINI,  in the last 168 hours  BNP: BNP (last 3 results)  Recent Labs  09/03/12 1656 09/05/12 1920 09/16/12 1639  PROBNP 2552.0* 2519.0* 2865.0*     Other results:  EKG:   Imaging:  No results found.   Medications:     Scheduled Medications: . benazepril  20 mg Oral QAC breakfast  . enoxaparin (LOVENOX) injection  40 mg Subcutaneous Q24H  . insulin aspart  0-5 Units Subcutaneous QHS  . insulin aspart  0-9 Units Subcutaneous TID WC  . insulin glargine  20 Units Subcutaneous QHS  . metolazone  5 mg Oral Daily  . mirtazapine  30 mg Oral QHS  . mycophenolate  1,000 mg Oral BID  .  pantoprazole  40 mg Oral BID  . predniSONE  15 mg Oral Daily  . pyridostigmine  60 mg Oral 5 X Daily  . sodium chloride  3 mL Intravenous Q12H  . zolpidem  5 mg Oral QHS     Infusions: . furosemide (LASIX) infusion 20 mg/hr (09/21/12 0750)  . milrinone 0.25 mcg/kg/min (09/21/12 0751)     PRN Medications:  sodium chloride, acetaminophen, alum & mag hydroxide-simeth, loperamide, ondansetron (ZOFRAN) IV, oxyCODONE, sodium chloride   Assessment:   1. A/C diastolic heart failure EF 65% 06/13/1094  2. Hyponatremia  3. Myasthenia Gravis  4. DM II - uncontrolled HGB A1C 11.9 07/2012  5. A/C renal failure (creatinine baseline 1.3)  6. Noncompliance  7. Sleep Apnea- occasional CPAP  8. Cardiorenal Syndrome   Plan/Discussion:     His urine output has picked up some but renal function also a bit worse. Will  continue current regimen today and reassess. I feel he has at least 10-15 pounds of fluid on board. If not progressing may need to consider RHC and/or ultrafiltration  Length of Stay: 5 Camela Wich 09/21/2012, 10:24 AM

## 2012-09-21 NOTE — Progress Notes (Signed)
08/21/12 Patient blood sugar 514 this afternoon at 1600. MD notified with no new orders received. Will cover the patient with 9 units per sliding scale. Anastasia Fiedler RN

## 2012-09-21 NOTE — Progress Notes (Signed)
Subjective:    Interval Events:  Patient reports vigorous response to diuretics overnight. Slept well without orthopnea.  Denies dyspnea.    Objective:    Vital Signs:   Filed Vitals:   09/19/12 2032 09/20/12 1423 09/21/12 0439 09/21/12 0953  BP:  132/72 112/56 127/63  Pulse:  76 92 92  Temp:  98.7 F (37.1 C) 98.6 F (37 C) 98 F (36.7 C)  TempSrc: Oral Oral Oral Oral  Resp:  18 18 20   Height:      Weight:   190 lb 11.2 oz (86.5 kg)   SpO2:  100% 97% 98%   Last BM Date: 09/20/12   Weights: 48-hour Weight change:   -0.64 kg  Filed Weights   09/18/12 0547 09/19/12 0523 09/21/12 0439  Weight: 194 lb 1.6 oz (88.043 kg) 192 lb 1.6 oz (87.136 kg) 190 lb 11.2 oz (86.5 kg)   Net since admission:  -2.36 kg   Intake/Output:   Gross per 24 hour  Intake    ~1400 mL  Output   3025 ml  Net  -1625 ml     Net since admission:  -3.7 L   Physical Exam: GENERAL: alert and oriented; resting comfortably, sitting on the side of his bed; no distress EYES: pupils equal, round, and reactive to light; sclera anicteric LUNGS: clear to auscultation bilaterally, normal work of breathing HEART: normal rate; regular rhythm; systolic murmur heard best at the upper sternal borders ABDOMEN: soft, non-tender, normal bowel sounds, no masses palpated EXTREMITIES: 1+ edema SKIN: normal turgor    Labs: Basic Metabolic Panel:Labs Lab 09/17/12 0600 09/18/12 0530 09/19/12 0535 09/20/12 0450 09/21/12 0440  NA 122* 127* 126* 127* 127*  K 4.3 4.1 4.0 3.8 3.7  CL 84* 86* 86* 88* 87*  CO2 29 29 30 28 28   GLUCOSE 385* 233* 212* 334* 376*  BUN 63* 63* 62* 62* 60*  CREATININE 1.50* 1.69* 1.48* 1.56* 1.67*  CALCIUM 9.4 9.6 9.4 9.0 9.4  MG  --   --   --  1.9  --   PHOS  --   --   --  4.8*  --     CBG: Lab 09/18/12 2100 09/19/12 1129 09/20/12 1138 09/20/12 1547 09/21/12 0649  GLUCAP 278* 274* 273* 305* 414*    Other results: Imaging: No results found.    Medications:     Infusions: . furosemide (LASIX) infusion 20 mg/hr (09/21/12 0750)  . milrinone 0.25 mcg/kg/min (09/21/12 0751)     Scheduled Medications: . benazepril  20 mg Oral QAC breakfast  . enoxaparin (LOVENOX) injection  40 mg Subcutaneous Q24H  . insulin aspart  0-5 Units Subcutaneous QHS  . insulin aspart  0-9 Units Subcutaneous TID WC  . insulin glargine  20 Units Subcutaneous QHS  . metolazone  5 mg Oral Daily  . mirtazapine  30 mg Oral QHS  . mycophenolate  1,000 mg Oral BID  . pantoprazole  40 mg Oral BID  . predniSONE  15 mg Oral Daily  . pyridostigmine  60 mg Oral 5 X Daily  . sodium chloride  3 mL Intravenous Q12H  . zolpidem  5 mg Oral QHS     PRN Medications: sodium chloride, acetaminophen, alum & mag hydroxide-simeth, loperamide, ondansetron (ZOFRAN) IV, oxyCODONE, sodium chloride    Assessment/ Plan:    1.   Acute exacerbation of chronic diastolic CHF:  An echocardiogram from 2011 demonstrated normal systolic function with grade 2 diastolic dysfunction. The echocardiogram performed earlier this  month demonstrated vigorous systolic function with an EF of 65-70% but grade 3 diastolic dysfunction.  This is complicated by near nephrotic range proteinuria secondary to diabetic nephropathy. His diuretic regimen was recently increased to furosemide 120 mg by mouth 4 times a day plus metolazone 5 mg daily as an outpatient. Here in the hospital, we were treating him with IV furosemide 100 mg every 6 hours and metolazone 5 mg orally every day with a modest response.  The heart failure team was consulted on 4/15, and they recommended milrinone and furosemide infusions. - Switch IV furosemide to a 20 mg/hr continuous infusion - Continue PO metolazone 5 mg daily - Start milrinone at 0.25 mcg/kg/hr (6.5 mL/hr) - Strict I&Os - Daily weights - Daily BMET - Heart failure team consulted  2.   Diabetic nephropathy with proteinuria:  Urine protein to creatinine ratio was measured at his  last hospitalization earlier this month:  it was 2.56. This is complicating his fluid status and management of CHF. We will diurese as noted above.  3.   Hyponatremia:  This is been a problem for some time.   His sodium was 126 at discharge earlier in the month. It was up to 128 a few days ago. It was 119 at admission and now 127.  4.   Stage III CKD:  Back in November and December of 2013 his creatinine was normal. It has been steadily in the 1.5-1.7 range this year. Given the findings from the echocardiogram and the elevated urine protein-to-creatinine ratio, I think progression of diabetic nephropathy is the primary cause; he is not yet in nephrotic range proteinuria but is spilling significant protein into his urine. While aggressively diuresing him, his creatinine has bounced between 1.42 and 1.69.  Today it is 1.67.  5.   Chronic microcytic anemia:  Baseline hemoglobin is around 12. It is currently 10.6. Reticulocyte count previously was 2.4% with an index of 1.82, indicating low but near-normal reticulocyte production. Iron panel was normal except for a slightly elevated ferritin of 323. I think this indicates an anemia of chronic inflammation with superimposed hemodilution.  6.   Type II diabetes mellitus, uncontrolled, with complications:  Patient has persistently refused to allow [place] to treat his diabetes. Despite trying to educate him over and over again, he does not want his sugars to fall below 300.  His last hemoglobin A1c was 11.9 in February.  He refused to be insulin glargine which was ordered for last night. I will add correctional scale insulin aspart today, but I doubt he will let the nurses administer this. - Continue insulin glargine 20 units at bedtime - Continue sensitive scale correctional insulin aspart  7.   Hypertension:  In addition to diuretics, he takes benazepril 20 mg daily. Over the past year his blood pressure has averaged about 150/85. His blood pressures have been  running in a similar range here in the hospital.  We will continue the benazepril here. - Continue benazepril 20 mg daily   8.   Myasthenia gravis: Managed at [facility] for this. Stable on his home regimen, which we have continued here.  - Continue mycophenolate 1000 mg twice a day  - Continue prednisone 15 mg daily  - Continue pyridostigmine 60mg  5 times daily  9.   Prophylaxis:  Enoxaparin 40 mg [employer] daily  10. Disposition:  Patient's PCP is [facility]. He will require OPC followup. He will not need outpatient services. He has no transportation limitations.    Length  of Stay: 5 days   Signed by:  Dorthula Rue. Earlene Plater, MD PGY-I, Internal Medicine Pager [phone]  09/21/2012, 10:10 AM

## 2012-09-21 NOTE — Progress Notes (Signed)
08/21/12 Patient continues to refuse insulin coverage and refused his morning lantus dose. Anastasia Fiedler RN

## 2012-09-21 NOTE — Progress Notes (Signed)
Inpatient Diabetes Program Recommendations  AACE/ADA: New Consensus Statement on Inpatient Glycemic Control (2013)  Target Ranges:  Prepandial:   less than 140 mg/dL      Peak postprandial:   less than 180 mg/dL (1-2 hours)      Critically ill patients:  140 - 180 mg/dL   Reason for Visit: Diabetes Coordinator RN spoke with patient concerning refusal of insulin.  Patient states that he feels bad if his glucose drops below 300.  He showed me a box of Sugar Babies in his drawer that he is using to keep him from "going low".  Attempted to explain that CBGs in the 300-400 range are abnormal but the patient insists that he will "worry about it when he gets this sodium thing fixed".  Explained that high glucose can also lead to electrolyte imbalances but he still refuses treatment for his hyperglycemia.  Patient does not seem to have good insight about the disease process. Patient may benefit from more extensive OP education.   Thank you  Piedad Climes BSN, RN,CDE Inpatient Diabetes Coordinator [phone] (team pager)

## 2012-09-21 NOTE — Progress Notes (Signed)
Cardiac Rehab [phone] On arrival pt at sink Checked room air sat 98%. Pt started talking and it was hard to get him to stop. His IV Milrinone was out and IV nurse came to restart it. We were able to discuss CHF and diabetes. Pt has a lot of reasons of why he is not taking insulin. It is difficult to change any ideas that he has about how to control his diabetes and the CHF. He has CHF packet and he knows that all of these conditions are working against each other. I was unable to get him in hall way for ambulation. I will try tomorrow.

## 2012-09-21 NOTE — Progress Notes (Signed)
Pt CBG 410 this am pt continues to refuse insulin even with encouragement to take it.

## 2012-09-21 NOTE — Plan of Care (Signed)
Problem: Undesirable Food Choices (NB-1.7) Goal: Nutrition education Formal process to instruct or train a patient/client in a skill or to impart knowledge to help patients/clients voluntarily manage or modify food choices and eating behavior to maintain or improve health. Outcome: Completed/Met Date Met:  09/21/12 Nutrition Education Note  RD consulted for nutrition education regarding CHF.  RD provided "Living Well with Heart Failure" booklet. Reviewed patient's dietary recall. Provided examples on ways to decrease sodium intake in diet. Discouraged intake of processed foods and use of salt shaker. Encouraged fresh fruits and vegetables as well as whole grain sources of carbohydrates to maximize fiber intake.   RD discussed why it is important for patient to adhere to diet recommendations, and emphasized the role of fluids, foods to avoid, and importance of weighing self daily. Teach back method used.  RD has educated this pt in the past. Pt eats out at a resturant daily. RD encouraged pt to cook more meals at home where he can control the sodium content of foods. Pt requested information about several meals here at the hospital. RD encouraged pt to use American Heart Association website as a resource for low sodium cooking information and recipes.   Expect poor compliance.  Body mass index is 29.86 kg/(m^2). Pt meets criteria for overweight based on current BMI.  Current diet order is Heart, patient is consuming approximately 100% of meals at this time. Labs and medications reviewed. No further nutrition interventions warranted at this time. RD contact information provided. If additional nutrition issues arise, please re-consult RD.   Clarene Duke RD, LDN Pager [phone] After Hours pager [phone]

## 2012-09-21 NOTE — Progress Notes (Signed)
INTERNAL MEDICINE TEACHING SERVICE Attending Note  Date: 09/21/2012  Patient name: [name]  Medical record number: [id number]  Date of birth: 08-20-1945    This patient has been seen and discussed with the house staff. Please see their note for complete details. I concur with their findings with the following additions/corrections: Appreciate cardiology input. Doing well on milrinone and lasix infusion.  Weight is down and having good UOP. Feels better today. Watch renal function, he is still within his baseline Cr. If he does worsen, then can consider stopping ACE-i. Otherwise, we may have to sacrifice some renal function in order to improve his cardiac function.   RHF, component of cardiorenal?   The treatment plan was discussed in detail with the patient.  Alternatives to treatment, side effects, risks and benefits, and complications were discussed with the patient. Informed consent was obtained. The patient agrees to proceed with the current treatment plan.  Jonah Blue, DO  09/21/2012, 2:11 PM

## 2012-09-21 NOTE — Progress Notes (Signed)
Patient refused 0800 medications. Will offer again at 0900. Anastasia Fiedler RN

## 2012-09-22 DIAGNOSIS — I10 Essential (primary) hypertension: Secondary | ICD-10-CM

## 2012-09-22 LAB — BASIC METABOLIC PANEL
Calcium: 9.6 mg/dL (ref 8.4–10.5)
Chloride: 90 mEq/L — ABNORMAL LOW (ref 96–112)
Creatinine, Ser: 1.41 mg/dL — ABNORMAL HIGH (ref 0.50–1.35)
GFR calc Af Amer: 58 mL/min — ABNORMAL LOW (ref >90)

## 2012-09-22 LAB — GLUCOSE, CAPILLARY: Glucose-Capillary: 364 mg/dL — ABNORMAL HIGH (ref 70–99)

## 2012-09-22 MED ORDER — LOPERAMIDE HCL 2 MG PO CAPS
2.0000 mg | ORAL_CAPSULE | Freq: Once | ORAL | Status: AC
  Administered 2012-09-22: 2 mg via ORAL
  Filled 2012-09-22: qty 2

## 2012-09-22 MED ORDER — METOLAZONE 5 MG PO TABS
5.0000 mg | ORAL_TABLET | Freq: Two times a day (BID) | ORAL | Status: DC
  Administered 2012-09-23 – 2012-09-24 (×3): 5 mg via ORAL
  Filled 2012-09-22 (×6): qty 1

## 2012-09-22 NOTE — Progress Notes (Signed)
Pt's IV with lasix going in it started leaking badly.  Stopped lasix and IV team has already been notified that pt needs a restart.  Will continue to monitor.

## 2012-09-22 NOTE — Progress Notes (Signed)
INTERNAL MEDICINE TEACHING SERVICE Attending Note  Date: 09/22/2012  Patient name: [name]  Medical record number: [id number]  Date of birth: [birth year]    This patient has been seen and discussed with the house staff. Please see their note for complete details. I concur with their findings with the following additions/corrections: He is walking around the hallway and states he is slowly feeling better. Slowly losing weight. UOP is mildly improved. Appreciate cardiology input. He will receive higher dose lasix gtt at 30 mg/hr and continued on metolazone as well as milrinone. 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 I discussed with him his non-adherence to insulin therapy. He is not letting us treat his diabetes mellitus as is appropriate. He understands the risks of not obtaining adequate control of DM. He has CKD due to DM and he is risking accelerating progression to ESRD, in addition to risk of additional CV events such as CVA and MI.  The treatment plan was discussed in detail with the patient.  Alternatives to treatment, side effects, risks and benefits, and complications were discussed with the patient. Informed consent was obtained. The patient agrees to proceed with the current treatment plan.  Jonah Blue, DO  09/22/2012, 11:43 AM

## 2012-09-22 NOTE — Progress Notes (Signed)
Pt demanding protonix.  Called Dr. Earlene Plater, was notified that it was fine to give another dose of protonix.  Will continue to monitor.

## 2012-09-22 NOTE — Progress Notes (Signed)
Pt CBG 548 this PM, MD notified and order placed for 9 units novolog instead of SSI. Pt also requested 1 time dose of mestinon. MD notified and ordered. Will continue to monitor. Baron Hamper, RN

## 2012-09-22 NOTE — Progress Notes (Signed)
Subjective:    Interval Events:  Slept well.  No events.  No complaints.  Denies dyspnea and orthopnea.    Objective:    Vital Signs:   Filed Vitals:   09/21/12 1100 09/21/12 1300 09/21/12 2023 09/22/12 0630  BP:  143/65 166/73 144/73  Pulse:  88 94 109  Temp:  97.9 F (36.6 C) 98.7 F (37.1 C) 97.3 F (36.3 C)  TempSrc:  Oral Oral Oral  Resp:  20 20 20   Height:      Weight: 191 lb 5.8 oz (86.8 kg)   189 lb (85.73 kg)  SpO2:  97% 97% 97%   Last BM Date: 09/20/12   Weights: 24-hour Weight change: 10.6 oz (0.3 kg)  Filed Weights   09/21/12 0439 09/21/12 1100 09/22/12 0630  Weight: 190 lb 11.2 oz (86.5 kg) 191 lb 5.8 oz (86.8 kg) 189 lb (85.73 kg)   Net since admission:  -3.13 kg  Dry weight:  ~76 kg   Intake/Output:   Gross per 24 hour  Intake 1370  Output 2600  Net -1229     Physical Exam: GENERAL: alert and oriented; standing in room comfortably, no distress EYES: pupils equal, round, and reactive to light; sclera anicteric LUNGS: clear to auscultation bilaterally, normal work of breathing HEART: normal rate; regular rhythm; normal S1 and S2, no S3 or S4 appreciated; no murmurs, rubs, or clicks ABDOMEN: soft, non-tender, normal bowel sounds, no masses palpated EXTREMITIES: 1+ edema SKIN: normal turgor    Labs: Basic Metabolic Panel: Lab 09/18/12 0530 09/19/12 0535 09/20/12 0450 09/21/12 0440 09/22/12 0543  NA 127* 126* 127* 127* 131*  K 4.1 4.0 3.8 3.7 3.7  CL 86* 86* 88* 87* 90*  CO2 29 30 28 28  33*  GLUCOSE 233* 212* 334* 376* 272*  BUN 63* 62* 62* 60* 58*  CREATININE 1.69* 1.48* 1.56* 1.67* 1.41*  CALCIUM 9.6 9.4 9.0 9.4 9.6  MG  --   --  1.9  --   --   PHOS  --   --  4.8*  --   --     CBG: Lab 09/21/12 1346 09/21/12 1703 09/21/12 2027 09/21/12 2225 09/22/12 0614  GLUCAP 483* 514* 548* 485* 262*    Other results: EKG Results:  09/20/2012 Rate:  83 PR:  172 QRS:  140 QTc:  495 Axis: Normal EKG: Sinus rhythm with  premature supraventricular complexes, right bundle branch block, unchanged.  Imaging: No results found.    Medications:    Infusions: . furosemide (LASIX) infusion 20 mg/hr (09/21/12 2157)  . milrinone 0.25 mcg/kg/min (09/21/12 2157)     Scheduled Medications: . benazepril  20 mg Oral QAC breakfast  . enoxaparin (LOVENOX) injection  40 mg Subcutaneous Q24H  . insulin aspart  0-5 Units Subcutaneous QHS  . insulin aspart  0-9 Units Subcutaneous TID WC  . insulin glargine  20 Units Subcutaneous QHS  . metolazone  5 mg Oral Daily  . mirtazapine  30 mg Oral QHS  . mycophenolate  1,000 mg Oral BID  . pantoprazole  40 mg Oral BID  . potassium chloride  40 mEq Oral Once  . predniSONE  15 mg Oral Daily  . pyridostigmine  60 mg Oral 5 X Daily  . sodium chloride  3 mL Intravenous Q12H  . zolpidem  5 mg Oral QHS     PRN Medications: sodium chloride, acetaminophen, alum & mag hydroxide-simeth, loperamide, ondansetron (ZOFRAN) IV, oxyCODONE, sodium chloride    Assessment/ Plan:  1.   Acute exacerbation of chronic diastolic CHF:  An echocardiogram from 2011 demonstrated normal systolic function with grade 2 diastolic dysfunction. The echocardiogram performed earlier this month demonstrated vigorous systolic function with an EF of 65-70% but grade 3 diastolic dysfunction.  This is complicated by near nephrotic range proteinuria secondary to diabetic nephropathy. His diuretic regimen was recently increased to furosemide 120 mg by mouth 4 times a day plus metolazone 5 mg daily as an outpatient. Here in the hospital, we were treating him with IV furosemide 100 mg every 6 hours and metolazone 5 mg orally every day with a modest response.  The heart failure team was consulted on 4/15, and they recommended milrinone and furosemide infusions. - Increase IV furosemide to 30 mg/hr continuous infusion per HF team - Continue PO metolazone 5 mg daily per HF team - Continue milrinone at 0.25 mcg/kg/hr  (6.5 mL/hr) per HF team - Strict I&Os - Daily weights - Daily BMET - Heart failure team consulted  2.   Diabetic nephropathy with proteinuria:  Urine protein to creatinine ratio was measured at his last hospitalization earlier this month:  it was 2.56. This is complicating his fluid status and management of CHF. We will diurese as noted above.  3.   Hyponatremia:  This is been a problem for some time.   His sodium was 126 at discharge earlier in the month. It was up to 128 a few days ago. It was 119 at admission and now 131.  4.   Stage III CKD:  Back in November and December of 2013 his creatinine was normal. It has been steadily in the 1.5-1.7 range this year. Given the findings from the echocardiogram and the elevated urine protein-to-creatinine ratio, I think progression of diabetic nephropathy is the primary cause; he is not yet in nephrotic range proteinuria but is spilling significant protein into his urine. While aggressively diuresing him, his creatinine has bounced between 1.42 and 1.69.  Today it is 1.41; perhaps indicating cardiorenal syndrome.  5.   Chronic microcytic anemia:  Baseline hemoglobin is around 12. It is currently 10.6. Reticulocyte count previously was 2.4% with an index of 1.82, indicating low but near-normal reticulocyte production. Iron panel was normal except for a slightly elevated ferritin of 323. I think this indicates an anemia of chronic inflammation with superimposed hemodilution.  6.   Type II diabetes mellitus, uncontrolled, with complications:  Patient has persistently refused to allow [place] to treat his diabetes. Despite trying to educate him over and over again, he does not want his sugars to fall below 300.  His last hemoglobin A1c was 11.9 in February.  He continues to refuse Lantus but did agree to two 9 unit insulin aspart administrations yesterday afternoon. - Continue insulin glargine 20 units at bedtime - Continue sensitive scale correctional insulin  aspart  7.   Hypertension:  In addition to diuretics, he takes benazepril 20 mg daily. Over the past year his blood pressure has averaged about 150/85. His blood pressures have been running in a similar range here in the hospital.  We will continue the benazepril here. - Continue benazepril 20 mg daily   8.   Myasthenia gravis: Managed at [facility] for this. Stable on his home regimen, which we have continued here.  - Continue mycophenolate 1000 mg twice a day  - Continue prednisone 15 mg daily  - Continue pyridostigmine 60mg  5 times daily  9.   Prophylaxis:  Enoxaparin 40 mg [place] daily  10.  Disposition:  Patient's PCP is [facility]. He will require OPC followup. He will not need outpatient services. He has no transportation limitations.    Length of Stay: 6 days   Signed by:  Dorthula Rue. Earlene Plater, MD PGY-I, Internal Medicine Pager [phone]  09/22/2012, 11:10 AM

## 2012-09-22 NOTE — Progress Notes (Signed)
Utilization Review Completed Gray Maugeri J. Ramsha Lonigro, RN, BSN, NCM [phone]  

## 2012-09-22 NOTE — Progress Notes (Signed)
Pt demanding four packs of sugar for coffee.  Blood sugar at lunch was 364 and yesterday was above 500.  Told pt I was not going to give him any sugar and asked if he would like splenda or sweet and low.  Pt refused splenda and sweet and low and said if he doesn't get his sugar he is leaving, then wanted to speak with the MD and charge nurse.  Charge nurse notified and MD came to bedside to speak with him.  MD notified me that it's ok to give him sugar.  Will continue to monitor.

## 2012-09-22 NOTE — Progress Notes (Signed)
Advanced Heart Failure Rounding Note   Subjective:     Remians on milrinone and lasix. Urine output  picked up a litle. Denies dyspnea or orthopnea. Weight down another pound or two. Creatinine stable.    Objective:   Weight Range:  Vital Signs:   Temp:  [97.3 F (36.3 C)-98.7 F (37.1 C)] 97.3 F (36.3 C) (04/17 0630) Pulse Rate:  [88-109] 109 (04/17 0630) Resp:  [20] 20 (04/17 0630) BP: (143-166)/(65-73) 144/73 mmHg (04/17 0630) SpO2:  [97 %] 97 % (04/17 0630) Weight:  [85.73 kg (189 lb)-86.8 kg (191 lb 5.8 oz)] 85.73 kg (189 lb) (04/17 0630) Last BM Date: 09/20/12  Weight change: Filed Weights   09/21/12 0439 09/21/12 1100 09/22/12 0630  Weight: 86.5 kg (190 lb 11.2 oz) 86.8 kg (191 lb 5.8 oz) 85.73 kg (189 lb)    Intake/Output:   Intake/Output Summary (Last 24 hours) at 09/22/12 1016 Last data filed at 09/22/12 0932  Gross per 24 hour  Intake 1628.31 ml  Output   3750 ml  Net -2121.69 ml     Physical Exam:  General: Elderly Chronically ill appearing. No resp difficulty  HEENT: normal  Neck: supple. JVP 8-10 . Carotids 2+ bilat; no bruits. No lymphadenopathy or thryomegaly appreciated.  Cor: PMI nondisplaced. Regular rate & rhythm. Frequent ectopy No rubs, gallops or murmurs.  Lungs: clear  Abdomen: soft, nontender, distended. No hepatosplenomegaly. No bruits or masses. Good bowel sounds.  Extremities: no cyanosis, clubbing, rash, R and LLE 3+ lower edema +[name] hose Neuro: alert & orientedx3, cranial nerves grossly intact. moves all 4 extremities w/o difficulty. Affect pleasant  Telemetry:SR   Labs: Basic Metabolic Panel:  Recent Labs Lab 09/18/12 0530 09/19/12 0535 09/20/12 0450 09/21/12 0440 09/22/12 0543  NA 127* 126* 127* 127* 131*  K 4.1 4.0 3.8 3.7 3.7  CL 86* 86* 88* 87* 90*  CO2 29 30 28 28  33*  GLUCOSE 233* 212* 334* 376* 272*  BUN 63* 62* 62* 60* 58*  CREATININE 1.69* 1.48* 1.56* 1.67* 1.41*  CALCIUM 9.6 9.4 9.0 9.4 9.6  MG  --   --   1.9  --   --   PHOS  --   --  4.8*  --   --     Liver Function Tests:  Recent Labs Lab 09/20/12 0450  ALBUMIN 2.7*   No results found for this basename: LIPASE, AMYLASE,  in the last 168 hours No results found for this basename: AMMONIA,  in the last 168 hours  CBC:  Recent Labs Lab 09/16/12 1638  WBC 7.8  NEUTROABS 5.7  HGB 10.6*  HCT 28.8*  MCV 76.2*  PLT 300    Cardiac Enzymes: No results found for this basename: CKTOTAL, CKMB, CKMBINDEX, TROPONINI,  in the last 168 hours  BNP: BNP (last 3 results)  Recent Labs  09/03/12 1656 09/05/12 1920 09/16/12 1639  PROBNP 2552.0* 2519.0* 2865.0*     Other results:  EKG:   Imaging: No results found.   Medications:     Scheduled Medications: . benazepril  20 mg Oral QAC breakfast  . enoxaparin (LOVENOX) injection  40 mg Subcutaneous Q24H  . insulin aspart  0-5 Units Subcutaneous QHS  . insulin aspart  0-9 Units Subcutaneous TID WC  . insulin glargine  20 Units Subcutaneous QHS  . metolazone  5 mg Oral Daily  . mirtazapine  30 mg Oral QHS  . mycophenolate  1,000 mg Oral BID  . pantoprazole  40 mg Oral BID  .  potassium chloride  40 mEq Oral Once  . predniSONE  15 mg Oral Daily  . pyridostigmine  60 mg Oral 5 X Daily  . sodium chloride  3 mL Intravenous Q12H  . zolpidem  5 mg Oral QHS    Infusions: . furosemide (LASIX) infusion 20 mg/hr (09/21/12 2157)  . milrinone 0.25 mcg/kg/min (09/21/12 2157)    PRN Medications: sodium chloride, acetaminophen, alum & mag hydroxide-simeth, loperamide, ondansetron (ZOFRAN) IV, oxyCODONE, sodium chloride   Assessment:   1. A/C diastolic heart failure EF 65% 06/13/1094  2. Hyponatremia  3. Myasthenia Gravis  4. DM II - uncontrolled HGB A1C 11.9 07/2012  5. A/C renal failure (creatinine baseline 1.3)  6. Noncompliance  7. Sleep Apnea- occasional CPAP  8. Cardiorenal Syndrome   Plan/Discussion:     His urine output has picked up mildly but still markedly  volume overloaded. I spoke to Dr. Elza Rafter at [facility]. RHC in 01/2012 confirmed significantly elevated L-sided pressures PCWP 35 with secondary PAH and (TRG ~ 10, PVR 2.5). [facility] weight at [facility] had been 176.   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 Still with at least 15 pounds of fluid on board. Refuses ultrafiltration. I think we need to squeeze him harder. Will increase lasix to 30mg /hr. Continue milrinone and metolazone.   Length of Stay: 6 Daniel Bensimhon 09/22/2012, 10:16 AM

## 2012-09-22 NOTE — Progress Notes (Signed)
Pt demanding two imodium capsules.  Pt only has a order for one imodium capsule.  Gave pt one imodium and called Dr. Collier Bullock to get an order for one more imodium.  Was given order, will carry out and continue to monitor.

## 2012-09-22 NOTE — Progress Notes (Signed)
CARDIAC REHAB PHASE I   PRE:  Rate/Rhythm: 110 ST  BP:  Supine:   Sitting:   Standing:    SaO2: 95 RA  MODE:  Ambulation: 150 ft   POST:  Rate/Rhythm: 110 ST  BP:  Supine:   Sitting: 155/81  Standing:    SaO2: 97 RA 1415-1510  On arrival pt up in hall walking . Checked RA sat and walked with him. Pt did not go far and states that he was going back to room, he was tired and he had walked a lot today.Pt is  frustrated that  his IV is not working today it is his Lasix and he is waiting on IV therapy. Did CHF education with pt and discussed zones. He voices understanding. He states that he can not stay in the hospital any longer. I got to get out of here tomorrow. Encouraged him to discuss with MD in am.  Melina Copa RN 09/22/2012 3:33 PM

## 2012-09-23 DIAGNOSIS — E871 Hypo-osmolality and hyponatremia: Secondary | ICD-10-CM

## 2012-09-23 LAB — BASIC METABOLIC PANEL
BUN: 61 mg/dL — ABNORMAL HIGH (ref 6–23)
Calcium: 9.6 mg/dL (ref 8.4–10.5)
GFR calc Af Amer: 57 mL/min — ABNORMAL LOW (ref >90)
GFR calc non Af Amer: 49 mL/min — ABNORMAL LOW (ref >90)
Potassium: 3.7 mEq/L (ref 3.5–5.1)

## 2012-09-23 LAB — GLUCOSE, CAPILLARY: Glucose-Capillary: 443 mg/dL — ABNORMAL HIGH (ref 70–99)

## 2012-09-23 NOTE — Progress Notes (Signed)
Pt had a burst of SVT, HR 154.  Pt stated he did feel funny, Dr. Gala Romney notified.

## 2012-09-23 NOTE — Progress Notes (Signed)
Pt's cbg is 443.  Called Dr. Earlene Plater and order given to give 9 units or whatever he will let me give.  Will carry out MD orders and continue to monitor.

## 2012-09-23 NOTE — Progress Notes (Signed)
Inpatient Diabetes Program Recommendations  AACE/ADA: New Consensus Statement on Inpatient Glycemic Control (2013)  Target Ranges:  Prepandial:   less than 140 mg/dL      Peak postprandial:   less than 180 mg/dL (1-2 hours)      Critically ill patients:  140 - 180 mg/dL   Pt refusal to take insulin regimen due to fear of hypoglycemia  Inpatient Diabetes Program Recommendations Insulin - Basal: Consider decreasing Lantus to 10 units (1/2 home dose)    Pt has CKD and has had experience with scary hypoglycemia.  Although his present hyperglycemia is not helping his present condition, pt is fearful of lower blood sugars and feels that in the short term, he is safer to run higher than lower.  Agree with note above to lower lantus to 10 units in order to reduce fear of hypoglycemia.  Some lantus would be better than none.  Will talk again with pt regarding taking insulin to lower glucose into at least into the 200's.  Thank you, Lenor Coffin, RN, CNS, Diabetes Coordinator [phone])

## 2012-09-23 NOTE — Discharge Summary (Signed)
Patient Name:  [name] MRN: [id number]  PCP: Leodis Sias, MD DOB:  03/16/1946       Date of Admission:  09/16/2012  Date of Discharge:  09/24/2012      Attending Physician: Jonah Blue, DO         DISCHARGE DIAGNOSES: 1. Acute exacerbation of chronic diastolic CHF 2. Diabetic nephropathy with proteinuria 3. Hyponatremia 4. Stage III CKD 5. Chronic microcytic anemia 6. Type 2 diabetes mellitus, uncontrolled, with complications 7. Hypertension 8. Myasthenia gravis    DISPOSITION AND FOLLOW-UP: [name] is to follow-up with the listed providers as detailed below, at which time, the following should be addressed:  1. Follow-up visits: 1. CHF: Discharged on torsemide 80 mg BID and metolazone 5 mg thrice weekly.  2. Labs and images needed: 1. BMET  3. Pending labs and tests needing follow-up:  NONE    Discharge Orders   Future Appointments Provider Department Dept Phone   10/07/2012 3:45 PM Leodis Sias, MD [facility] [phone]   Future Orders Complete By Expires     (HEART FAILURE PATIENTS) Call MD:  Anytime you have any of the following symptoms: 1) 3 pound weight gain in 24 hours or 5 pounds in 1 week 2) shortness of breath, with or without a dry hacking cough 3) swelling in the hands, feet or stomach 4) if you have to sleep on extra pillows at night in order to breathe.  As directed     Diet - low sodium heart healthy  As directed     Discharge instructions  As directed     Comments:      You were hospitalized with an acute exacerbation of Congestive Heart Failure.  You have lost about 6-8 pounds since you first arrived, but your dry weight is probably 176 pounds.  We are STOPPING your Lasix (Furosemide), and CHANGING your Metolazone ("kicker pill") to only every Monday and Friday.  We are STARTING a new diuretic medication called Demadex (Torsemide).  Take 80 mg (4 tablets) twice per day.  Please call our clinic on  Monday, 4/21, to schedule a hospital follow-up appointment within the next week.    Increase activity slowly  As directed         DISCHARGE MEDICATIONS:   Medication List    STOP taking these medications       furosemide 40 MG tablet  Commonly known as:  LASIX      TAKE these medications       benazepril 20 MG tablet  Commonly known as:  LOTENSIN  Take 1 tablet (20 mg total) by mouth daily before breakfast.     FreeStyle Freedom Kit  1 each by Does not apply route as needed.     FREESTYLE LITE TEST VI  1 each 4 (four) times daily.     insulin aspart 100 UNIT/ML injection  Commonly known as:  novoLOG  Take 5 units with each meal three times daily.     insulin glargine 100 UNIT/ML injection  Commonly known as:  LANTUS SOLOSTAR  Inject 0.2 mLs (20 Units total) into the skin at bedtime.     MESTINON 60 MG tablet  Generic drug:  pyridostigmine  Take 60 mg by mouth 5 (five) times daily. Medication starts to wear off closer to 2.5 hours     metolazone 5 MG tablet  Commonly known as:  ZAROXOLYN  Take 1 tablet (5 mg total) by mouth 2 (two)  times a week. On Monday and Friday mornings     mirtazapine 30 MG tablet  Commonly known as:  REMERON  Take 1 tablet (30 mg total) by mouth at bedtime.     mycophenolate 500 MG tablet  Commonly known as:  CELLCEPT  Take 1,000 mg by mouth 2 (two) times daily.     oxyCODONE 5 MG immediate release tablet  Commonly known as:  Oxy IR/ROXICODONE  Take 1 tablet (5 mg total) by mouth every 4 (four) hours as needed. For pain  Start taking on:  11/11/2012     pantoprazole 40 MG tablet  Commonly known as:  PROTONIX  Take 1 tablet (40 mg total) by mouth daily.     Pen Needles 31G X 6 MM Misc  Inject 1 each into the skin daily.     predniSONE 5 MG tablet  Commonly known as:  DELTASONE  Take 15 mg by mouth daily.     torsemide 20 MG tablet  Commonly known as:  DEMADEX  Take 4 tablets (80 mg total) by mouth 2 (two) times daily.      zolpidem 10 MG tablet  Commonly known as:  AMBIEN  Take 5 mg by mouth at bedtime.         CONSULTS: 1.   Heart Failure Team   PROCEDURES PERFORMED:  Dg Chest 2 View 09/16/2012  FINDINGS: Stable to slightly enlarged right pleural effusion and associated right basilar opacity.  There is pulmonary vascular congestion without overt pulmonary edema.  Stable mild cardiomegaly with left atrial enlargement.  Trace atherosclerotic calcification in the thoracic aorta.  No acute osseous abnormality. IMPRESSION:  1.  No acute cardiopulmonary disease. 2.  Stable to incrementally enlarged right pleural effusion and associated basilar atelectasis. 3.  Mild vascular congestion without overt edema. 4.  Left atrial enlargement    ADMISSION DATA: H&P: 67 year old male history of chronic diastolic heart failure (grade 3), history of peripheral edema, uncontrolled diabetes, HTN presents for worsening leg swelling and shortness of breath at rest and with exertion. He also complains of orthopnea not being able to lie flat and he sleeps on the a desk with his head down and is unable to lie down in the bed. He was seen in the [facility] [place] 09/16/12 for leg edema at that time Metolazone was added and Lasix was increased from 80 mg QID to 120 mg QID. He states his dry weight is 188 lbs. He is not having adequate urine output after the increase in Lasix and addition of Metolazone. He mentions unintentionally a couple of days ago he ate 2 pretzels and has been drinking 16 oz of water in a gatorade container. His cardiologist is in [place].   Physical Exam: Vitals: Blood pressure 165/80, pulse 76, temperature 98 F (36.7 C), temperature source Oral, resp. rate 18, weight 194 lb 8 oz (88.225 kg), SpO2 99.00%.  General: sitting up by bed, nad, alert and oriented x 3  HEENT: Bowlegs/at, wet oral mucosa  CV: RRR, no murmurs  Lungs: ctab  Ab/GU: obese, soft, ntnd, nl bowel sounds  Extremities: 2 to 3+ lower extremity pitting edema,  warm no cyanosis or edema  Neuro: alert and oriented x 3, moving all 4 extremities    Labs: Basic Metabolic Panel:   09/15/12 0913  09/16/12 1638   NA  122*  119*   K  4.6  4.5   CL  84*  82*   CO2  28  28   GLUCOSE  365*  331*   BUN  63*  60*   CREATININE  1.42*  1.55*   CALCIUM  9.4  9.4    CBC:   09/16/12 1638   WBC  7.8   NEUTROABS  5.7   HGB  10.6*   HCT  28.8*   MCV  76.2*   PLT  300    BNP:   09/16/12 1639   PROBNP  2865.0*    CBG:   09/15/12 1041   GLUCAP  311*     HOSPITAL COURSE: 1.   Acute exacerbation of chronic diastolic CHF:  An echocardiogram from 2011 demonstrated normal systolic function with grade 2 diastolic dysfunction. The echocardiogram performed earlier this month demonstrated vigorous systolic function with an EF of 65-70% but grade 3 diastolic dysfunction. This is complicated by near nephrotic range proteinuria secondary to diabetic nephropathy. His diuretic regimen was recently increased to furosemide 120 mg by mouth 4 times a day plus metolazone 5 mg daily as an outpatient. Here in the hospital, we were treating him with IV furosemide 100 mg every 6 hours and metolazone 5 mg orally every day with a modest response. The heart failure team was consulted on 4/15, and they recommended milrinone and furosemide infusions. This has produced a better response and the patient diuresed well.  He was discharged on torsemide 80 mg BID plus metolazone 5 mg thrice weekly at the heart failure team's recommendation.  2.   Diabetic nephropathy with proteinuria:  Urine protein to creatinine ratio was measured at his last hospitalization earlier this month: it was 2.56. This is complicating his fluid status and management of CHF.  He is on benazipril 20 mg daily.  3.   Hyponatremia:  This is been a problem for some time. His sodium was 126 at discharge earlier in the month. It was up to 128 a few days ago. It was 119 at admission and now 129.  4.   Stage III CKD:   Back in November and December of 2013 his creatinine was normal. It has been steadily in the 1.5-1.7 range this year. Given the findings from the echocardiogram and the elevated urine protein-to-creatinine ratio, I think progression of diabetic nephropathy is the primary cause; he is not yet in nephrotic range proteinuria but is spilling significant protein into his urine. While aggressively diuresing him, his creatinine has bounced between 1.41 and 1.69. Today it is 1.41.  5.   Chronic microcytic anemia:  Baseline hemoglobin is around 12. It is currently 10.6. Reticulocyte count previously was 2.4% with an index of 1.82, indicating low but near-normal reticulocyte production. Iron panel was normal except for a slightly elevated ferritin of 323. I think this indicates an anemia of chronic inflammation with superimposed hemodilution.  6.   Type II diabetes mellitus, uncontrolled, with complications:  Patient has persistently refused to allow [place] to treat his diabetes. Despite trying to educate him over and over again, he does not want his sugars to fall below 300. His last hemoglobin A1c was 11.9 in February. He continues to refuse Lantus but is beginning to agree with sporadic insulin aspart administrations.   7.   Hypertension:  In addition to diuretics, he takes benazepril 20 mg daily. Over the past year his blood pressure has averaged about 150/85. His blood pressures have been running in a similar range here in the hospital. We will continue the benazepril here.   8.   Myasthenia gravis:  Managed at [facility] for this. Stable  on his home regimen, which we have continued here.    DISCHARGE DATA: Vital Signs: BP 145/79  Pulse 84  Temp(Src) 98.4 F (36.9 C) (Oral)  Resp 19  Ht 5\' 7"  (1.702 m)  Wt 188 lb 9.6 oz (85.548 kg)  BMI 29.53 kg/m2  SpO2 99%  Labs: Basic Metabolic Panel:  Lab  09/19/12 0535  09/20/12 0450  09/21/12 0440  09/22/12 0543  09/23/12 0540   NA  126*  127*  127*  131*  129*    K  4.0  3.8  3.7  3.7  3.7   CL  86*  88*  87*  90*  88*   CO2  30  28  28   33*  33*   GLUCOSE  212*  334*  376*  272*  397*   BUN  62*  62*  60*  58*  61*   CREATININE  1.48*  1.56*  1.67*  1.41*  1.42*   CALCIUM  9.4  9.0  9.4  9.6  9.6   MG  --  1.9  --  --  --   PHOS  --  4.8*  --  --  --     CBG:  Lab  09/21/12 2225  09/22/12 0614  09/22/12 1140  09/22/12 1626  09/22/12 2003   GLUCAP  485*  262*  364*  263*  415*     Time spent on discharge: 38 minutes   Services Ordered on Discharge: 1. PT - no 2. OT - no 3. RN - no 4. Other - no   Signed by:  Dorthula Rue. Earlene Plater, MD PGY-I, Internal Medicine  09/23/2012, 11:10 AM

## 2012-09-23 NOTE — Progress Notes (Signed)
INTERNAL MEDICINE TEACHING SERVICE Attending Note  Date: 09/23/2012  Patient name: [name]  Medical record number: [id number]  Date of birth: 24-Mar-1946    This patient has been seen and discussed with the house staff. Please see their note for complete details. I concur with their findings with the following additions/corrections: Feels well. No CP, no SOB. States no PND or orthopnea. He continues to have UOP and weight slowly trending down. Cardiology managing current Lasix gtt and milrinone infusion.   Renal function stable. If he wants to go home tomorrow, will need demadex 80 mg po bid instead of lasix and metolazone 5 mg on Mon and Fri. I educated him on DM control. He continues to restrict our treatment of his DM. He understands complications if he does not follow our recommendations. HF education given as well.  The treatment plan was discussed in detail with the patient.  Alternatives to treatment, side effects, risks and benefits, and complications were discussed with the patient. Informed consent was obtained. The patient agrees to proceed with the current treatment plan.  Jonah Blue, DO  09/23/2012, 2:31 PM

## 2012-09-23 NOTE — Progress Notes (Addendum)
Advanced Heart Failure Rounding Note   Subjective:    Dr. Gala Romney spoke with Dr. Elza Rafter at [facility]. RHC in 01/2012 confirmed significantly elevated L-sided pressures PCWP 35 with secondary PAH and (TRG ~ 10, PVR 2.5). Average weight at [facility] had been 176.   Remains on milrinone and lasix increased 30 mg/hr yesterday.  UOP increased, net -2100.  Weight down 3 pounds overnight, 8 pounds total.  Cr stable. Says he feels well.  Denies dyspnea, orthopnea or PND.    Objective:    Vital Signs:   Temp:  [98.3 F (36.8 C)-98.6 F (37 C)] 98.5 F (36.9 C) (04/18 0603) Pulse Rate:  [96-109] 109 (04/18 0603) Resp:  [18] 18 (04/18 0603) BP: (106-150)/(52-76) 106/52 mmHg (04/18 0603) SpO2:  [96 %-100 %] 97 % (04/18 0755) Weight:  [186 lb 12.8 oz (84.732 kg)-188 lb 14.4 oz (85.684 kg)] 186 lb 12.8 oz (84.732 kg) (04/18 0603) Last BM Date: 09/22/12  Weight change: Filed Weights   09/22/12 0630 09/22/12 1946 09/23/12 0603  Weight: 189 lb (85.73 kg) 188 lb 14.4 oz (85.684 kg) 186 lb 12.8 oz (84.732 kg)    Intake/Output:   Intake/Output Summary (Last 24 hours) at 09/23/12 0912 Last data filed at 09/23/12 0912  Gross per 24 hour  Intake   1420 ml  Output   3950 ml  Net  -2530 ml     Physical Exam:  General: Elderly Chronically ill appearing. No resp difficulty  HEENT: normal  Neck: supple. JVP 8-9 . Carotids 2+ bilat; no bruits. No lymphadenopathy or thryomegaly appreciated.  Cor: PMI nondisplaced. Regular rate & rhythm. Frequent ectopy No rubs, gallops or murmurs.  Lungs: clear  Abdomen: soft, nontender, distended. No hepatosplenomegaly. No bruits or masses. Good bowel sounds.  Extremities: no cyanosis, clubbing, rash, R and LLE 2+ lower edema +[name] hose Neuro: alert & orientedx3, cranial nerves grossly intact. moves all 4 extremities w/o difficulty. Affect pleasant    Telemetry:SR   Labs: Basic Metabolic Panel:  Recent Labs Lab 09/19/12 0535 09/20/12 0450  09/21/12 0440 09/22/12 0543 09/23/12 0540  NA 126* 127* 127* 131* 129*  K 4.0 3.8 3.7 3.7 3.7  CL 86* 88* 87* 90* 88*  CO2 30 28 28  33* 33*  GLUCOSE 212* 334* 376* 272* 397*  BUN 62* 62* 60* 58* 61*  CREATININE 1.48* 1.56* 1.67* 1.41* 1.42*  CALCIUM 9.4 9.0 9.4 9.6 9.6  MG  --  1.9  --   --   --   PHOS  --  4.8*  --   --   --     Liver Function Tests:  Recent Labs Lab 09/20/12 0450  ALBUMIN 2.7*   No results found for this basename: LIPASE, AMYLASE,  in the last 168 hours No results found for this basename: AMMONIA,  in the last 168 hours  CBC:  Recent Labs Lab 09/16/12 1638  WBC 7.8  NEUTROABS 5.7  HGB 10.6*  HCT 28.8*  MCV 76.2*  PLT 300    Cardiac Enzymes: No results found for this basename: CKTOTAL, CKMB, CKMBINDEX, TROPONINI,  in the last 168 hours  BNP: BNP (last 3 results)  Recent Labs  09/03/12 1656 09/05/12 1920 09/16/12 1639  PROBNP 2552.0* 2519.0* 2865.0*      Imaging: No results found.   Medications:     Scheduled Medications: . benazepril  20 mg Oral QAC breakfast  . enoxaparin (LOVENOX) injection  40 mg Subcutaneous Q24H  . insulin aspart  0-5 Units Subcutaneous  QHS  . insulin aspart  0-9 Units Subcutaneous TID WC  . insulin glargine  20 Units Subcutaneous QHS  . metolazone  5 mg Oral BID  . mirtazapine  30 mg Oral QHS  . mycophenolate  1,000 mg Oral BID  . pantoprazole  40 mg Oral BID  . potassium chloride  40 mEq Oral Once  . predniSONE  15 mg Oral Daily  . pyridostigmine  60 mg Oral 5 X Daily  . sodium chloride  3 mL Intravenous Q12H  . zolpidem  5 mg Oral QHS    Infusions: . furosemide (LASIX) infusion 30 mg/hr (09/23/12 0117)  . milrinone 0.25 mcg/kg/min (09/23/12 0700)    PRN Medications: sodium chloride, acetaminophen, alum & mag hydroxide-simeth, loperamide, ondansetron (ZOFRAN) IV, oxyCODONE, sodium chloride   Assessment:   1. A/C diastolic heart failure EF 65% 06/13/1094  2. Hyponatremia  3. Myasthenia  Gravis  4. DM II - uncontrolled HGB A1C 11.9 07/2012  5. A/C renal failure (creatinine baseline 1.3)  6. Noncompliance  7. Sleep Apnea- occasional CPAP  8. Cardiorenal Syndrome  Plan/Discussion:    Diuresis has picked up some with increase in lasix 30 mg/hr.  Will continue current regimen today of lasix and metolazone.  He is quite adamant that he will be going home tomorrow.  Will determine outpatient regimen.      Have discussed heart failure education and provided him with low sodium diet information.  If need be we can see him for post hospital follow up but he should return to primary cardiologist/HF team at [facility] for further management.    Length of Stay: 7 Robbi Garter, [facility] 09/23/2012, 9:12 AM  Advanced Heart Failure Team Pager [phone] (M-F; 7a - 4p)  Please contact [facility] for night-coverage after hours (4p -7a ) and weekends on [email or web address]   Patient seen and examined with Ulyess Blossom, PA-C. We discussed all aspects of the encounter. I agree with the assessment and plan as stated above. Weight continues to trend down in the face of very high dose diuretics. Renal function is stable. Still about 10 pounds above baseline weight of 176. He was adamant about wanting to go home tomorrow however now says he will stay as long as he needs to. Would continue current regimen. If he leaves tomorrow. Would change lasix to demadex 80 bid and metolazone 5 mg on Monday and Friday. Extensive HF education provided including education on low-salt foods/cooking and need for fluid restriction.   Daniel Bensimhon,MD 2:12 PM

## 2012-09-23 NOTE — Progress Notes (Signed)
Subjective:    Interval Events:  Slept well again last night without dyspnea or orthopnea.  Agreeable to staying one more day at the least.    Objective:    Vital Signs:   Filed Vitals:   09/22/12 1448 09/22/12 1946 09/22/12 2103 09/23/12 0603  BP: 129/76  150/75 106/52  Pulse: 106  96 109  Temp: 98.6 F (37 C)  98.3 F (36.8 C) 98.5 F (36.9 C)  TempSrc: Oral  Oral Oral  Resp: 18  18 18   Height:      Weight:  188 lb 14.4 oz (85.684 kg)  186 lb 12.8 oz (84.732 kg)  SpO2: 96%  100% 96%    Weights: 24-hour Weight change: -2 lb 7.4 oz (-1.116 kg)  Filed Weights   09/22/12 0630 09/22/12 1946 09/23/12 0603  Weight: 189 lb (85.73 kg) 188 lb 14.4 oz (85.684 kg) 186 lb 12.8 oz (84.732 kg)   Net since admission:  -4.1 kg   Intake/Output:   Gross per 24 hour  Intake   1320 ml  Output   3425 ml  Net  -2105 ml     Last BM Date: 09/22/12   Physical Exam: GENERAL: alert and oriented; resting comfortably in bed and in no distress EYES: pupils equal, round, and reactive to light; sclera anicteric ENT: moist mucosa LUNGS: clear to auscultation bilaterally, normal work of breathing HEART: normal rate; regular rhythm; normal S1 and S2, no S3 or S4 appreciated; no murmurs, rubs, or clicks ABDOMEN: soft, non-tender, normal bowel sounds, no masses palpated EXTREMITIES: 1+ edema SKIN: normal turgor    Labs: Basic Metabolic Panel: Lab 09/19/12 0535 09/20/12 0450 09/21/12 0440 09/22/12 0543 09/23/12 0540  NA 126* 127* 127* 131* 129*  K 4.0 3.8 3.7 3.7 3.7  CL 86* 88* 87* 90* 88*  CO2 30 28 28  33* 33*  GLUCOSE 212* 334* 376* 272* 397*  BUN 62* 62* 60* 58* 61*  CREATININE 1.48* 1.56* 1.67* 1.41* 1.42*  CALCIUM 9.4 9.0 9.4 9.6 9.6  MG  --  1.9  --   --   --   PHOS  --  4.8*  --   --   --     CBG: Lab 09/21/12 2225 09/22/12 0614 09/22/12 1140 09/22/12 1626 09/22/12 2003  GLUCAP 485* 262* 364* 263* 415*    Imaging: No results found.    Medications:     Infusions: . furosemide (LASIX) infusion 30 mg/hr (09/23/12 0117)  . milrinone 0.25 mcg/kg/min (09/23/12 0700)     Scheduled Medications: . benazepril  20 mg Oral QAC breakfast  . enoxaparin (LOVENOX) injection  40 mg Subcutaneous Q24H  . insulin aspart  0-5 Units Subcutaneous QHS  . insulin aspart  0-9 Units Subcutaneous TID WC  . insulin glargine  20 Units Subcutaneous QHS  . metolazone  5 mg Oral BID  . mirtazapine  30 mg Oral QHS  . mycophenolate  1,000 mg Oral BID  . pantoprazole  40 mg Oral BID  . potassium chloride  40 mEq Oral Once  . predniSONE  15 mg Oral Daily  . pyridostigmine  60 mg Oral 5 X Daily  . sodium chloride  3 mL Intravenous Q12H  . zolpidem  5 mg Oral QHS     PRN Medications: sodium chloride, acetaminophen, alum & mag hydroxide-simeth, loperamide, ondansetron (ZOFRAN) IV, oxyCODONE, sodium chloride    Assessment/ Plan:    1.   Acute exacerbation of chronic diastolic CHF:  An echocardiogram from 2011  demonstrated normal systolic function with grade 2 diastolic dysfunction. The echocardiogram performed earlier this month demonstrated vigorous systolic function with an EF of 65-70% but grade 3 diastolic dysfunction.  This is complicated by near nephrotic range proteinuria secondary to diabetic nephropathy. His diuretic regimen was recently increased to furosemide 120 mg by mouth 4 times a day plus metolazone 5 mg daily as an outpatient. Here in the hospital, we were treating him with IV furosemide 100 mg every 6 hours and metolazone 5 mg orally every day with a modest response.  The heart failure team was consulted on 4/15, and they recommended milrinone and furosemide infusions.  This has produced a better response and the patient is diuresing well.  His creatinine is stable and maybe slightly improved. - Continue IV furosemide at 30 mg/hr continuous infusion per HF team - Continue PO metolazone 5 mg daily per HF team - Continue milrinone at 0.25 mcg/kg/hr  (6.5 mL/hr) per HF team - Strict I&Os - Daily weights - Daily BMET - Heart failure team consulted, they will help plan an outpatient regimen   2.   Diabetic nephropathy with proteinuria:  Urine protein to creatinine ratio was measured at his last hospitalization earlier this month:  it was 2.56. This is complicating his fluid status and management of CHF. We will diurese as noted above.  3.   Hyponatremia:  This is been a problem for some time.   His sodium was 126 at discharge earlier in the month. It was up to 128 a few days ago. It was 119 at admission and now 129.  4.   Stage III CKD:  Back in November and December of 2013 his creatinine was normal. It has been steadily in the 1.5-1.7 range this year. Given the findings from the echocardiogram and the elevated urine protein-to-creatinine ratio, I think progression of diabetic nephropathy is the primary cause; he is not yet in nephrotic range proteinuria but is spilling significant protein into his urine. While aggressively diuresing him, his creatinine has bounced between 1.41 and 1.69.  Today it is 1.42; perhaps indicating cardiorenal syndrome.  5.   Chronic microcytic anemia:  Baseline hemoglobin is around 12. It is currently 10.6. Reticulocyte count previously was 2.4% with an index of 1.82, indicating low but near-normal reticulocyte production. Iron panel was normal except for a slightly elevated ferritin of 323. I think this indicates an anemia of chronic inflammation with superimposed hemodilution.  6.   Type II diabetes mellitus, uncontrolled, with complications:  Patient has persistently refused to allow [place] to treat his diabetes. Despite trying to educate him over and over again, he does not want his sugars to fall below 300.  His last hemoglobin A1c was 11.9 in February.  He continues to refuse Lantus but is beginning to agree with sporadic insulin aspart administrations. - Continue insulin glargine 20 units at bedtime - Continue  sensitive scale correctional insulin aspart  7.   Hypertension:  In addition to diuretics, he takes benazepril 20 mg daily. Over the past year his blood pressure has averaged about 150/85. His blood pressures have been running in a similar range here in the hospital.  We will continue the benazepril here. - Continue benazepril 20 mg daily   8.   Myasthenia gravis: Managed at [facility] for this. Stable on his home regimen, which we have continued here.  - Continue mycophenolate 1000 mg twice a day  - Continue prednisone 15 mg daily  - Continue pyridostigmine 60mg  5 times  daily  9.   Prophylaxis:  Enoxaparin 40 mg [place] daily  10. Disposition:  Patient's PCP is [facility]. He will require OPC followup. He will not need outpatient services. He has no transportation limitations.    Length of Stay: 7 days   Signed by:  Dorthula Rue. Earlene Plater, MD PGY-I, Internal Medicine Pager [phone]  09/23/2012, 7:12 AM

## 2012-09-24 LAB — BASIC METABOLIC PANEL
BUN: 58 mg/dL — ABNORMAL HIGH (ref 6–23)
CO2: 30 mEq/L (ref 19–32)
Chloride: 87 mEq/L — ABNORMAL LOW (ref 96–112)
Creatinine, Ser: 1.41 mg/dL — ABNORMAL HIGH (ref 0.50–1.35)
GFR calc Af Amer: 58 mL/min — ABNORMAL LOW (ref >90)
Glucose, Bld: 383 mg/dL — ABNORMAL HIGH (ref 70–99)
Potassium: 4 mEq/L (ref 3.5–5.1)

## 2012-09-24 LAB — GLUCOSE, CAPILLARY: Glucose-Capillary: 362 mg/dL — ABNORMAL HIGH (ref 70–99)

## 2012-09-24 MED ORDER — METOLAZONE 5 MG PO TABS: 5.0000 mg | ORAL_TABLET | ORAL | Status: DC

## 2012-09-24 MED ORDER — TORSEMIDE 20 MG PO TABS: 80.0000 mg | ORAL_TABLET | Freq: Two times a day (BID) | ORAL | Status: DC

## 2012-09-24 NOTE — Progress Notes (Signed)
Primary cardiologist: Dr. Arvilla Meres.  Subjective:   Patient states that he feels much better and insists on going home today.   Objective:   Temp:  [98.4 F (36.9 C)-98.7 F (37.1 C)] 98.4 F (36.9 C) (04/18 2108) Pulse Rate:  [84-89] 84 (04/18 2108) Resp:  [19-20] 19 (04/18 2108) BP: (134-145)/(58-79) 145/79 mmHg (04/18 2108) SpO2:  [98 %-99 %] 99 % (04/18 2108) Weight:  [188 lb 9.6 oz (85.548 kg)] 188 lb 9.6 oz (85.548 kg) (04/19 0506) Last BM Date: 09/22/12  Filed Weights   09/22/12 1946 09/23/12 0603 09/24/12 0506  Weight: 188 lb 14.4 oz (85.684 kg) 186 lb 12.8 oz (84.732 kg) 188 lb 9.6 oz (85.548 kg)    Intake/Output Summary (Last 24 hours) at 09/24/12 1035 Last data filed at 09/24/12 0900  Gross per 24 hour  Intake    960 ml  Output   4075 ml  Net  -3115 ml   Telemetry: Sinus with PACs, brief burst SVT.  Exam:  General: No acute distress.  Lungs: Clear, nonlabored.  Cardiac: RRR with occasional ectopy, no gallop.  Abdomen: Nontender, bowel sounds present.  Extremities: 1-2+ edema.  Lab Results:  Basic Metabolic Panel:  Recent Labs Lab 09/20/12 0450  09/22/12 0543 09/23/12 0540 09/24/12 0545  NA 127*  < > 131* 129* 129*  K 3.8  < > 3.7 3.7 4.0  CL 88*  < > 90* 88* 87*  CO2 28  < > 33* 33* 30  GLUCOSE 334*  < > 272* 397* 383*  BUN 62*  < > 58* 61* 58*  CREATININE 1.56*  < > 1.41* 1.42* 1.41*  CALCIUM 9.0  < > 9.6 9.6 9.7  MG 1.9  --   --   --   --   < > = values in this interval not displayed.   BNP:  Recent Labs  09/03/12 1656 09/05/12 1920 09/16/12 1639  PROBNP 2552.0* 2519.0* 2865.0*    Medications:   Scheduled Medications: . benazepril  20 mg Oral QAC breakfast  . enoxaparin (LOVENOX) injection  40 mg Subcutaneous Q24H  . insulin aspart  0-5 Units Subcutaneous QHS  . insulin aspart  0-9 Units Subcutaneous TID WC  . insulin glargine  20 Units Subcutaneous QHS  . metolazone  5 mg Oral BID  . mirtazapine  30 mg Oral  QHS  . mycophenolate  1,000 mg Oral BID  . pantoprazole  40 mg Oral BID  . potassium chloride  40 mEq Oral Once  . predniSONE  15 mg Oral Daily  . pyridostigmine  60 mg Oral 5 X Daily  . sodium chloride  3 mL Intravenous Q12H  . zolpidem  5 mg Oral QHS     Infusions: . furosemide (LASIX) infusion 30 mg/hr (09/24/12 0704)  . milrinone 0.25 mcg/kg/min (09/24/12 0216)     PRN Medications:  sodium chloride, acetaminophen, alum & mag hydroxide-simeth, loperamide, ondansetron (ZOFRAN) IV, oxyCODONE, sodium chloride   Assessment:   1. Acute on chronic diastolic heart failure, LVEF 65%. Has had good diuresis on high-dose Lasix drip as well as milrinone, although still not back in baseline weight.  2. Hyponatremia, stable.  3. Acute on chronic renal insufficiency, creatinine 1.4, relatively stable.  4. Obstructive sleep apnea, occasional use of CPAP.  5. History of noncompliance.  6. Type 2 diabetes mellitus, not well controlled, medication adjustments as per medicine service.  6. History of myasthenia gravis.   Plan/Discussion:    Patient insists on going  home today. I reviewed with Dr. Gala Romney. Will discontinue milrinone and Lasix infusions. Beta blocker not planned as yet. Continue current Lotensin dose, use Zaroxolyn 5 mg on Monday and Friday, and change diuretics to Demadex 80 mg twice daily. He should have a followup in the advanced heart failure clinic within a week. Diet already reviewed with him.  Jonelle Sidle, M.D., F.A.C.C.

## 2012-09-24 NOTE — Progress Notes (Signed)
Subjective:    Interval Events:  Patient [name] demanding to go home today.  Weight up slightly today to 188 lbs from 185 yesterday (though 188 two days ago).  I/O's reveal diuresis of 3825 cc's of urine yesterday.    Objective:    Vital Signs:   Filed Vitals:   09/23/12 0755 09/23/12 1532 09/23/12 2108 09/24/12 0506  BP:  134/58 145/79   Pulse:  89 84   Temp:  98.7 F (37.1 C) 98.4 F (36.9 C)   TempSrc:  Oral Oral   Resp:  20 19   Height:      Weight:    188 lb 9.6 oz (85.548 kg)  SpO2: 97% 98% 99%     Weights: 24-hour Weight change: -4.8 oz (-0.136 kg)  Filed Weights   09/22/12 1946 09/23/12 0603 09/24/12 0506  Weight: 188 lb 14.4 oz (85.684 kg) 186 lb 12.8 oz (84.732 kg) 188 lb 9.6 oz (85.548 kg)   Net since admission:  -6 lbs   Intake/Output:   Gross per 24 hour  Intake   1320 ml  Output   3425 ml  Net  -2105 ml     Last BM Date: 09/22/12   Physical Exam: GENERAL: alert and oriented; resting comfortably in bed and in no distress EYES: pupils equal, round, and reactive to light; sclera anicteric ENT: moist mucosa LUNGS: clear to auscultation bilaterally, normal work of breathing HEART: normal rate; regular rhythm; normal S1 and S2, no S3 or S4 appreciated; no murmurs, rubs, or clicks ABDOMEN: soft, non-tender, normal bowel sounds, no masses palpated EXTREMITIES: 1+ edema SKIN: normal turgor    Labs: Basic Metabolic Panel: Lab 09/19/12 0535 09/20/12 0450 09/21/12 0440 09/22/12 0543 09/23/12 0540  NA 126* 127* 127* 131* 129*  K 4.0 3.8 3.7 3.7 3.7  CL 86* 88* 87* 90* 88*  CO2 30 28 28  33* 33*  GLUCOSE 212* 334* 376* 272* 397*  BUN 62* 62* 60* 58* 61*  CREATININE 1.48* 1.56* 1.67* 1.41* 1.42*  CALCIUM 9.4 9.0 9.4 9.6 9.6  MG  --  1.9  --   --   --   PHOS  --  4.8*  --   --   --     CBG: Lab 09/21/12 2225 09/22/12 0614 09/22/12 1140 09/22/12 1626 09/22/12 2003  GLUCAP 485* 262* 364* 263* 415*    Imaging: No results found.      Medications:    Infusions: . furosemide (LASIX) infusion 30 mg/hr (09/24/12 0704)  . milrinone 0.25 mcg/kg/min (09/24/12 0216)     Scheduled Medications: . benazepril  20 mg Oral QAC breakfast  . enoxaparin (LOVENOX) injection  40 mg Subcutaneous Q24H  . insulin aspart  0-5 Units Subcutaneous QHS  . insulin aspart  0-9 Units Subcutaneous TID WC  . insulin glargine  20 Units Subcutaneous QHS  . metolazone  5 mg Oral BID  . mirtazapine  30 mg Oral QHS  . mycophenolate  1,000 mg Oral BID  . pantoprazole  40 mg Oral BID  . potassium chloride  40 mEq Oral Once  . predniSONE  15 mg Oral Daily  . pyridostigmine  60 mg Oral 5 X Daily  . sodium chloride  3 mL Intravenous Q12H  . zolpidem  5 mg Oral QHS     PRN Medications: sodium chloride, acetaminophen, alum & mag hydroxide-simeth, loperamide, ondansetron (ZOFRAN) IV, oxyCODONE, sodium chloride    Assessment/ Plan:    1.   Acute exacerbation of chronic  diastolic CHF:  An echocardiogram from 2011 demonstrated normal systolic function with grade 2 diastolic dysfunction. The echocardiogram performed earlier this month demonstrated vigorous systolic function with an EF of 65-70% but grade 3 diastolic dysfunction.  This is complicated by near nephrotic range proteinuria secondary to diabetic nephropathy. His diuretic regimen was recently increased to furosemide 120 mg by mouth 4 times a day plus metolazone 5 mg daily as an outpatient. Here in the hospital, we were treating him with IV furosemide drip at 30 mL/hr and metolazone 5 mg orally every day with a modest response.  The heart failure team was consulted on 4/15, and they recommended milrinone as well.  This has produced a better response and the patient is diuresing well.  His creatinine is stable and maybe slightly improved. -d/c IV lasix drip and milrinone drip -discharge on torsemide 80 BID and metolazone 5 mg every Monday and Friday - greatly appreciate assistance from CHF  team  2.   Diabetic nephropathy with proteinuria:  Urine protein to creatinine ratio was measured at his last hospitalization earlier this month:  it was 2.56. This is complicating his fluid status and management of CHF. We will diurese as noted above.  3.   Hyponatremia:  This is been a problem for some time.   His sodium was 126 at discharge earlier in the month. It was up to 128 a few days ago. It was 119 at admission and now 129.  4.   Stage III CKD:  Back in November and December of 2013 his creatinine was normal. It has been steadily in the 1.5-1.7 range this year. Given the findings from the echocardiogram and the elevated urine protein-to-creatinine ratio, I think progression of diabetic nephropathy is the primary cause; he is not yet in nephrotic range proteinuria but is spilling significant protein into his urine. While aggressively diuresing him, his creatinine has bounced between 1.41 and 1.69.  Today it is 1.41  5.   Chronic microcytic anemia:  Baseline hemoglobin is around 12. It is currently 10.6. Reticulocyte count previously was 2.4% with an index of 1.82, indicating low but near-normal reticulocyte production. Iron panel was normal except for a slightly elevated ferritin of 323. I think this indicates an anemia of chronic inflammation with superimposed hemodilution.  6.   Type II diabetes mellitus, uncontrolled, with complications:  Patient has persistently refused to allow [place] to treat his diabetes. Despite trying to educate him over and over again, he does not want his sugars to fall below 300.  His last hemoglobin A1c was 11.9 in February.  He continues to refuse Lantus but is beginning to agree with sporadic insulin aspart administrations. - Continue insulin glargine 20 units at bedtime - Continue sensitive scale correctional insulin aspart  7.   Hypertension:  In addition to diuretics, he takes benazepril 20 mg daily. Over the past year his blood pressure has averaged about  150/85. His blood pressures have been running in a similar range here in the hospital.  We will continue the benazepril here. - Continue benazepril 20 mg daily   8.   Myasthenia gravis: Managed at [facility] for this. Stable on his home regimen, which we have continued here.  - Continue mycophenolate 1000 mg twice a day  - Continue prednisone 15 mg daily  - Continue pyridostigmine 60mg  5 times daily  9.   Prophylaxis:  Enoxaparin 40 mg [place] daily  10. Disposition:  Plan for discharge today, per patient's insistence.  Patient's PCP is  Bard Herbert. He will require OPC followup. He will not need outpatient services. He has no transportation limitations.    Length of Stay: 8 days   Signed by:  Dorthula Rue. Earlene Plater, MD PGY-I, Internal Medicine Pager [phone]  09/24/2012, 10:43 AM

## 2012-09-25 ENCOUNTER — Telehealth: Payer: Self-pay | Admitting: Internal Medicine

## 2012-09-25 NOTE — Telephone Encounter (Signed)
  INTERNAL MEDICINE RESIDENCY PROGRAM After-Hours Telephone Call    Reason for call:   I received a call from [name] at 12:00  PM indicating leg swelling.    Pertinent Data:   The patient was discharged yesterday, after a hospital stay for CHF exacerbation.  His weight at discharge was 188.  The patient is concerned that the Torsemide 80 mg BID medication that we started him on is not "strong enough".  He notes that when weighing himself today, his weight at home was 184.  He notes increased leg swelling, but admits to not elevating his legs, which he was doing in the hospital.  He notes no SOB.  He does note mild lightheadedness upon awakening this morning, not changed by position, which improved after eating breakfast.    Assessment / Plan / Recommendations:   The patient was advised to continue to take Torsemide 80 mg BID as prescribed.  He was advised that the dose of this medication could be increased as needed, but that he should continue the current dose for now since he has only been on this dose for <24 hours, and he should continue to weigh himself daily.  The patient expressed understanding.  As always, pt is advised that if symptoms worsen or new symptoms arise, they should go to an urgent care facility or to to ER for further evaluation.    [name], MD   09/25/2012, 12:09 PM

## 2012-09-25 NOTE — Telephone Encounter (Addendum)
  INTERNAL MEDICINE RESIDENCY PROGRAM After-Hours Telephone Call    Reason for call:   I received a call from [name] at 1:30  PM indicating lightheadedness.    Pertinent Data:   The patient calls back today noting lightheadeness, noted as a generalized feeling of "not right".  He notes that when he sits down, he has to "stand right back up", because he is afraid of getting short of breath, though he does not actually note any SOB when that occurs.  He notes that his lightheadedness is not changed by position.  He does not note symptoms of presyncope, syncope, or dizziness.  He states that he thinks his "sodium is low", and asks that we increase the dose of his Torsemide, stating that he does not think 80 mg PO BID will be sufficient.    Assessment / Plan / Recommendations:   The patient has called several times, and expressed anxiety over his current heath condition.  I believe the patient's current symptoms of lightheadedness stem from anxiety, which can be addressed in the outpatient setting.  I believe the best course of action would be to continue his medications as prescribed, and have him seen in [facility] this week for evaluation.  As always, pt is advised that if symptoms worsen or new symptoms arise, they should go to an urgent care facility or to to ER for further evaluation.    [name], MD   09/25/2012, 1:42 PM

## 2012-09-26 ENCOUNTER — Telehealth (HOSPITAL_COMMUNITY): Payer: Self-pay | Admitting: Cardiology

## 2012-09-26 ENCOUNTER — Telehealth: Payer: Self-pay | Admitting: *Deleted

## 2012-09-26 NOTE — Telephone Encounter (Signed)
Per Dr Gala Romney pt needs to f/u with HF team at [facility], he is scheduled with pcp tomorrow will let them address

## 2012-09-26 NOTE — Telephone Encounter (Signed)
Pt recently seen in hospital for fluid overload.  pts states meds were changed and they are not helping keep the fluid off at all, (torsemide) Pt states he is still very dizzy. Mild SOB. LE edema bilaterally   Denies chest pains 185.6lb- discharged around 184 (he thinks) No follow up scheduled with CHF clinic Ok to leave detailed message

## 2012-09-26 NOTE — Discharge Summary (Signed)
INTERNAL MEDICINE TEACHING SERVICE Attending Note  Date: 09/26/2012  Patient name: [name]  Medical record number: [id number]  Date of birth: 10-26-1945    This patient has been discussed with the house staff. Please see their note for complete details. I concur with their findings and plan.   The treatment plan was discussed in detail with the patient.  Alternatives to treatment, side effects, risks and benefits, and complications were discussed with the patient. Informed consent was obtained. The patient agrees to proceed with the current treatment plan.  Jonah Blue, DO  09/26/2012, 9:11 AM

## 2012-09-26 NOTE — Telephone Encounter (Signed)
Pt has called 3 times today, he doesn't seem to remember calling from one to another??? He speaks without notice of distress, denies to me shortness of breath. Does states the fluid is increasing , he thinks instead of decreasing, states he feels like s_ _ _ states his legs are very edematous. States he is more dizzy than ever. Wants to see dr Tonny Branch. i spoke with dr Tonny Branch via phone and pt is advised to go to ED or urg care, he refuses. He is encouraged several times to seek help in ED or urg care and refuses. He is scheduled w/ dr Lavena Bullion tomorrow 4/22 and will come in for labs early, wants them stat. appt tomorrow w/ dr Lavena Bullion

## 2012-09-27 ENCOUNTER — Other Ambulatory Visit: Payer: Self-pay | Admitting: Internal Medicine

## 2012-09-27 ENCOUNTER — Ambulatory Visit (INDEPENDENT_AMBULATORY_CARE_PROVIDER_SITE_OTHER): Payer: Medicare Other | Admitting: Radiation Oncology

## 2012-09-27 ENCOUNTER — Encounter: Payer: Self-pay | Admitting: Radiation Oncology

## 2012-09-27 VITALS — BP 130/70 | HR 85 | Temp 98.2°F | Ht 67.0 in | Wt 188.2 lb

## 2012-09-27 DIAGNOSIS — G7000 Myasthenia gravis without (acute) exacerbation: Secondary | ICD-10-CM

## 2012-09-27 DIAGNOSIS — I5033 Acute on chronic diastolic (congestive) heart failure: Secondary | ICD-10-CM

## 2012-09-27 DIAGNOSIS — I509 Heart failure, unspecified: Secondary | ICD-10-CM

## 2012-09-27 DIAGNOSIS — E871 Hypo-osmolality and hyponatremia: Secondary | ICD-10-CM

## 2012-09-27 DIAGNOSIS — Z79899 Other long term (current) drug therapy: Secondary | ICD-10-CM

## 2012-09-27 DIAGNOSIS — E1165 Type 2 diabetes mellitus with hyperglycemia: Secondary | ICD-10-CM

## 2012-09-27 DIAGNOSIS — I951 Orthostatic hypotension: Secondary | ICD-10-CM

## 2012-09-27 DIAGNOSIS — I10 Essential (primary) hypertension: Secondary | ICD-10-CM

## 2012-09-27 LAB — CBC
HCT: 27.5 % — ABNORMAL LOW (ref 39.0–52.0)
Hemoglobin: 10 g/dL — ABNORMAL LOW (ref 13.0–17.0)
MCH: 27.6 pg (ref 26.0–34.0)
MCHC: 36.4 g/dL — ABNORMAL HIGH (ref 30.0–36.0)
MCV: 76 fL — ABNORMAL LOW (ref 78.0–100.0)
RBC: 3.62 MIL/uL — ABNORMAL LOW (ref 4.22–5.81)

## 2012-09-27 LAB — BASIC METABOLIC PANEL
CO2: 31 mEq/L (ref 19–32)
Chloride: 79 mEq/L — ABNORMAL LOW (ref 96–112)
Potassium: 3.9 mEq/L (ref 3.5–5.3)
Sodium: 122 mEq/L — ABNORMAL LOW (ref 135–145)

## 2012-09-27 LAB — HEPATIC FUNCTION PANEL
ALT: 6 U/L (ref 0–53)
AST: 14 U/L (ref 0–37)
Albumin: 3.2 g/dL — ABNORMAL LOW (ref 3.5–5.2)
Alkaline Phosphatase: 109 U/L (ref 39–117)
Total Bilirubin: 0.5 mg/dL (ref 0.3–1.2)

## 2012-09-27 LAB — GAMMA GT: GGT: 51 U/L (ref 7–51)

## 2012-09-27 NOTE — Progress Notes (Signed)
Subjective:    Patient ID: [name], male    DOB: 08-17-1945, 67 y.o.   MRN: [id number]  HPI Patient is a 67 year old man with PMH significant for grade 3 diastolic CHF who presents to clinic today for hospital followup after a recent hospital admission for CHF exacerbation. He was discharged on torsemide 80 mg twice a day and metolazone 5 mg 2 times a week. His weight at the time of hospital discharge on 09/24/2012 was 188.6 pounds.  At today's visit to clinic the patient's weight remains stable at 188.2 pounds. He states she's been taking torsemide 80 mg twice daily as prescribed. He states he's taking metolazone 5 mg 2x weekly on a M-F schedule as prescribed. He denies any shortness of breath today. Denies orthopnea/PND. Denies any weight gain home, and states his weight has been stable at 184-186 pounds since discharge on 4/19. Denies any worsened leg swelling, and states he just began wearing compression stockings which he picked up earlier today. He has been continuing sodium restriction to <2g daily and is restricting fluids to 1-1.5L daily. He states he's had limited appetite recently which he attributes to his unappealing low-sodium diet.   The patient's primary complaint today is of lightheadedness which persists since discharge. He believes it may be slightly worse on standing but is not sure. No other alleviating or exacerbating factors. He believes the lightheadedness is secondary to the fact that he has not eaten a full meal since leaving the hospital, per his report. He also admits to decreased urination since discharge, and believes that his diuretics are no longer working. He has not been seen in the [facility] CHF clinic since discharge, and states does not have an appointment scheduled in the near future.   Review of Systems  All other systems reviewed and are negative.     Current Outpatient Medications: Current Outpatient Prescriptions  Medication Sig Dispense Refill  .  benazepril (LOTENSIN) 20 MG tablet Take 1 tablet (20 mg total) by mouth daily before breakfast.  30 tablet  3  . Blood Glucose Monitoring Suppl (FREESTYLE FREEDOM) KIT 1 each by Does not apply route as needed.      . Glucose Blood (FREESTYLE LITE TEST VI) 1 each 4 (four) times daily.      . insulin aspart (NOVOLOG) 100 UNIT/ML injection Take 5 units with each meal three times daily.  15 vial  12  . insulin glargine (LANTUS SOLOSTAR) 100 UNIT/ML injection Inject 0.2 mLs (20 Units total) into the skin at bedtime.  15 mL  12  . Insulin Pen Needle (PEN NEEDLES) 31G X 6 MM MISC Inject 1 each into the skin daily.  100 each  6  . metolazone (ZAROXOLYN) 5 MG tablet Take 1 tablet (5 mg total) by mouth 2 (two) times a week. On Monday and Friday mornings  30 tablet  1  . mirtazapine (REMERON) 30 MG tablet Take 1 tablet (30 mg total) by mouth at bedtime.  30 tablet  1  . mycophenolate (CELLCEPT) 500 MG tablet Take 1,000 mg by mouth 2 (two) times daily.       Melene Muller ON 11/11/2012] oxyCODONE (OXY IR/ROXICODONE) 5 MG immediate release tablet Take 1 tablet (5 mg total) by mouth every 4 (four) hours as needed. For pain  120 tablet  0  . pantoprazole (PROTONIX) 40 MG tablet Take 1 tablet (40 mg total) by mouth daily.  30 tablet  6  . predniSONE (DELTASONE) 5 MG tablet Take 15  mg by mouth daily.      Marland Kitchen pyridostigmine (MESTINON) 60 MG tablet Take 60 mg by mouth 5 (five) times daily. Medication starts to wear off closer to 2.5 hours      . torsemide (DEMADEX) 20 MG tablet Take 4 tablets (80 mg total) by mouth 2 (two) times daily.  240 tablet  1  . zolpidem (AMBIEN) 10 MG tablet Take 5 mg by mouth at bedtime.      . [DISCONTINUED] pantoprazole (PROTONIX) 40 MG tablet Take 1 tablet (40 mg total) by mouth daily.  30 tablet  2   No current facility-administered medications for this visit.    Allergies: Allergies  Allergen Reactions  . Aminoglycosides     MG patient  . Beta Adrenergic Blockers Other (See Comments)     MG patient  . Calcium Channel Blockers Other (See Comments)    MG patient  . Metoprolol Other (See Comments)    MG patient  . Neuromuscular Blocking Agents Other (See Comments)    MG patient  . Other Other (See Comments)    Myasthenia Gravis patient  . Penicillins Other (See Comments)    MG  . Quinine Derivatives Other (See Comments)    MG patient     Past Medical History: Past Medical History  Diagnosis Date  . Sinus tachycardia 2006    a. Nonobstructive CAD by cath 2013. b. s/p atrial tachycardia ablation 08/2011 (sinus node modification)  . Orthostatic hypotension   . Unspecified essential hypertension   . Hyperlipidemia   . Cervical spondylosis with radiculopathy   . Syrinx     T5-T6  . Cord compression     Cord compression syndrome C5-C6, C6-C7  . ADD (attention deficit disorder)   . Decreased libido   . Depression   . Diabetes mellitus type 2, uncontrolled, with complications     CAD, orthostatic hypotension, erectile dysfunction  . Pulmonary nodule, left     Evaluated 04/2011 by pulmonology - felt to be benign granuloma  . Peripheral edema   . Coronary artery disease, non-occlusive     Mild nonobstructive CAD by cath 08/2011  . Systolic congestive heart failure     By echo 2013 at [facility] - reported EF 45% per pt  . Colitis     with surface exudate  . Hemorrhoids     small  . Gastritis   . Clostridium difficile infection 02/15/2012    causing pseudomembranosu colitis  . Diastolic heart failure     grade 2 per echocardiogram (2011)  . Right bundle branch block and left posterior fascicular block   . Sleep apnea     occassionally uses cpap  . 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 Thymectomy  april 2012  . Colonoscopy  11/13/2011    Procedure: COLONOSCOPY;  Surgeon: Theda Belfast, MD;  Location: [facility];  Service: Endoscopy;  Laterality:  N/A;  . Cardiac catheterization    . Esophagogastroduodenoscopy  05/13/2012    Procedure: ESOPHAGOGASTRODUODENOSCOPY (EGD);  Surgeon: Iva Boop, MD;  Location: [facility];  Service: Endoscopy;  Laterality: N/A;  needs PAT w/ dx of Myastenia gravis and multiple meds. Pt requested lidocaine to numb IV site prior to start.  Very needle phobic.    Family History: Family History  Problem Relation Age of Onset  . Breast cancer Mother   . Other Father  Beck's Sarcoid  . Colon cancer Neg Hx   . Prostate cancer Neg Hx     Social History: History   Social History  . Marital Status: Single    Spouse Name: N/A    Number of Children: 1  . Years of Education: N/A   Occupational History  . Retired     Surveyor, minerals   Social History Main Topics  . Smoking status: Former Smoker -- 2.50 packs/day for 30 years    Quit date: 06/09/1991  . Smokeless tobacco: Never Used  . Alcohol Use: No  . Drug Use: No  . Sexually Active: Not on file   Other Topics Concern  . Not on file   Social History Narrative   Has a single man's lifestyle and will remind anyone of it when asked.   Prefers to see only male physicians   Retired Surveyor, minerals      Lives at home with mother (42 y.o)     Vital Signs: There were no vitals taken for this visit.      Objective:   Physical Exam  Constitutional: He is oriented to person, place, and time. He appears well-developed and well-nourished. No distress.  HENT:  Head: Normocephalic and atraumatic.  Eyes: Conjunctivae are normal. Pupils are equal, round, and reactive to light. No scleral icterus.  Neck: Normal range of motion. Neck supple. No tracheal deviation present.  Cardiovascular: Normal rate and regular rhythm.   No murmur heard. Pulmonary/Chest: Effort normal. He has no wheezes. He has no rales.  Abdominal: Soft. Bowel sounds are normal. He exhibits no distension. There is no tenderness.  Musculoskeletal: Normal range of motion.  2+  pitting edema in bilateral lower extremities.  Neurological: He is alert and oriented to person, place, and time. No cranial nerve deficit.  Skin: Skin is warm and dry. No erythema.  Psychiatric: He has a normal mood and affect. His behavior is normal.          Assessment & Plan:

## 2012-09-27 NOTE — Assessment & Plan Note (Signed)
Unclear if pt's lightheadedness is 2/2 to this issue as he would not allow for his BP to be measured while lying down or sitting but only while standing. To note, BP and HR within normal rates while standing on repeat measurements in clinic today.  - will decrease diuretics (see "CHF" in problem list)  BP Readings from Last 3 Encounters:  09/27/12 130/70  09/23/12 145/79  09/15/12 137/76

## 2012-09-27 NOTE — Assessment & Plan Note (Signed)
Sodium decreased to 122 today. Likely secondary to combination of torsemide and metolazone started in 08/2012 as he does not appear to have had the issue previously. Underlying CHF also likely contributing significantly.  - d/c metolazone - cont toresemide  BMET    Component Value Date/Time   NA 122* 09/27/2012 1423   K 3.9 09/27/2012 1423   CL 79* 09/27/2012 1423   CO2 31 09/27/2012 1423   GLUCOSE 322* 09/27/2012 1423   BUN 79* 09/27/2012 1423   CREATININE 2.11* 09/27/2012 1423   CREATININE 1.41* 09/24/2012 0545   CALCIUM 9.5 09/27/2012 1423   GFRNONAA 50* 09/24/2012 0545   GFRAA 58* 09/24/2012 0545

## 2012-09-27 NOTE — Assessment & Plan Note (Signed)
BP Readings from Last 3 Encounters:  09/27/12 130/70  09/23/12 145/79  09/15/12 137/76    Lab Results  Component Value Date   NA 122* 09/27/2012   K 3.9 09/27/2012   CREATININE 2.11* 09/27/2012    Assessment: Blood pressure control: controlled Progress toward BP goal:  at goal Comments:   Plan: Medications:  D/c metolazone. Will have pt hold benazepril until prerenal azotemia resolves. Cont torsemide.  Educational resources provided: Comptroller tools provided:   Other plans:

## 2012-09-27 NOTE — Assessment & Plan Note (Addendum)
No overt signs/symptoms of CHF exacerbation today. Lower extremity edema at baseline. No shortness of breath, orthopnea, or PND. The patient had a metabolic panel performed today, however he stated he did not want to wait in clinic for the results of these stat labs, and would rather have [place] call him with the results and inform him of any necessary changes in his management plan for this issue. - f/u BMET results   UPDATE: BMET results indicate sodium has further deceased to 122. Cr and BUN are elevated. It appears he has prerenal azotemia secondary to diuresis--pt still has hypervolemia on exam, however he obviously also has decreased EAV which is making him increasingly difficult to manage as an outpatient. Will d/c metolazone at this time, however will continue torsemide to help avoid further CHF exacerbations. He requests to establish with a local CHF clinic as he no longer wants to make regular trips to [place]--will attempt to accommodate him on this issue. He definitely needs to see a cardiologist regularly given his tenuous volume status and electrolyte issues with diuresis. It is also difficult to determine how much of the patient's orthopnea symptoms (when he has them) are secondary to CHF vs MG at this dyspneic complaint can be characteristic of both entities. Likely has both processes are contributing to this as well as to his fatigue symptoms, thus continued regular f/u with neurology will be of paramount importance in managing both issues (e.g. to avoid over-diuresis, etc.) - d/c metolazone - cont torsemide 80mg  bid - refer to CHF clinic - will discuss with PCP for additional input - pt instructed to RTC w/in 1 wk for reassessment (or to go to the ED if symptoms worsen prior to being seen)   BMET    Component Value Date/Time   NA 122* 09/27/2012 1423   K 3.9 09/27/2012 1423   CL 79* 09/27/2012 1423   CO2 31 09/27/2012 1423   GLUCOSE 322* 09/27/2012 1423   BUN 79* 09/27/2012 1423   CREATININE 2.11* 09/27/2012 1423   CREATININE 1.41* 09/24/2012 0545   CALCIUM 9.5 09/27/2012 1423   GFRNONAA 50* 09/24/2012 0545   GFRAA 58* 09/24/2012 0545

## 2012-09-27 NOTE — Assessment & Plan Note (Signed)
Lab Results  Component Value Date   HGBA1C 11.9 07/12/2012   HGBA1C 9.9* 03/17/2012   HGBA1C 14.0 10/30/2011     Assessment: Diabetes control: poor control (HgbA1C >9%) Progress toward A1C goal:  unchanged Comments:   Plan: Medications:  continue current medications Home glucose monitoring: Frequency:   Timing:   Instruction/counseling given: reminded to bring medications to each visit and discussed diet Educational resources provided: brochure;handout Self management tools provided:   Other plans: recommended compliance with insulin however pt continues to desire his blood glucose to remain >300 and refuses interventions to lower it.

## 2012-09-27 NOTE — Assessment & Plan Note (Signed)
No evidence of crisis. States he has been compliant with all meds including cellcept and mestinon. - cont management per Dr. Dimas Aguas of [facility] neurology

## 2012-09-27 NOTE — Progress Notes (Signed)
Lab Results faxed to [facility] Dr. Clarisa [facility].  Confirmed by phone that Misty Stanley received results on fax machine for [facility] and will Leave a copy of the results for Dr. Dimas Aguas and Waynetta Sandy.  Levy Sjogren 09/27/12 4:24pm

## 2012-09-27 NOTE — Telephone Encounter (Signed)
review 

## 2012-09-27 NOTE — Patient Instructions (Signed)
General instructions: We will call you with the results of your lab work. If there are any abnormalities we may need to change one or more of your medications.   Heart Failure Heart failure (HF) is a condition in which the heart has trouble pumping blood. This means your heart does not pump blood efficiently for your body to work well. In some cases of HF, fluid may back up into your lungs or you may have swelling (edema) in your lower legs. HF is a long-term (chronic) condition. It is important for you to take good care of yourself and follow your caregiver's treatment plan. CAUSES   Health conditions:  High blood pressure (hypertension) causes the heart muscle to work harder than normal. When pressure in the blood vessels is high, the heart needs to pump (contract) with more force in order to circulate blood throughout the body. High blood pressure eventually causes the heart to become stiff and weak.  Coronary artery disease (CAD) is the buildup of cholesterol and fat (plaques) in the arteries of the heart. The blockage in the arteries deprives the heart muscle of oxygen and blood. This can cause chest pain and may lead to a heart attack. High blood pressure can also contribute to CAD.  Heart attack (myocardial infarction) occurs when 1 or more arteries in the heart become blocked. The loss of oxygen damages the muscle tissue of the heart. When this happens, part of the heart muscle dies. The injured tissue does not contract as well and weakens the heart's ability to pump blood.  Abnormal heart valves can cause HF when the heart valves do not open and close properly. This makes the heart muscle pump harder to keep the blood flowing.  Heart muscle disease (cardiomyopathy or myocarditis) is damage to the heart muscle from a variety of causes. These can include drug or alcohol abuse, infections, or unknown reasons. These can increase the risk of HF.  Lung disease makes the heart work harder  because the lungs do not work properly. This can cause a strain on the heart leading it to fail.  Diabetes increases the risk of HF. High blood sugar contributes to high fat (lipid) levels in the blood. Diabetes can also cause slow damage to tiny blood vessels that carry important nutrients to the heart muscle. When the heart does not get enough oxygen and food, it can cause the heart to become weak and stiff. This leads to a heart that does not contract efficiently.  Other diseases can contribute to HF. These include abnormal heart rhythms, thyroid problems, and low blood counts (anemia).  Unhealthy lifestyle habits:  Obesity.  Smoking.  Eating foods high in fat and cholesterol.  Eating or drinking beverages high in salt.  Drug or alcohol abuse.  Lack of exercise. SYMPTOMS  HF symptoms may vary and can be hard to detect. Symptoms may include:  Shortness of breath with activity, such as climbing stairs.  Persistent cough.  Swelling of the feet, ankles, legs, or abdomen.  Unexplained weight gain.  Difficulty breathing when lying flat.  Waking from sleep because of the need to sit up and get more air.  Rapid heartbeat.  Fatigue and loss of energy.  Feeling lightheaded or close to fainting. DIAGNOSIS  A diagnosis of HF is based on your history, symptoms, physical examination, and diagnostic tests. Diagnostic tests for HF may include:  EKG.  Chest X-ray.  Blood tests.  Exercise stress test.  Blood oxygen test (arterial blood gas).  Evaluation by a heart doctor (cardiologist).  Ultrasound evaluation of the heart (echocardiogram).  Heart artery test to look for blockages (angiogram).  Radioactive imaging to look at the heart (radionuclide test). TREATMENT  Treatment is aimed at managing the symptoms of HF. Medicines, lifestyle changes, or surgical intervention may be necessary to treat HF.  Medicines to help treat HF may include:  Angiotensin-converting  enzyme (ACE) inhibitors. These block the effects of a blood protein called angiotensin-converting enzyme. ACE inhibitors relax (dilate) the blood vessels and help lower blood pressure. This decreases the workload of the heart, slows the progression of HF, and improves symptoms.  Angiotensin receptor blockers (ARBs). These medications work similar to ACE inhibitors. ARBs may be an alternative for people who cannot tolerate an ACE inhibitor.  Aldosterone antagonists. This medication helps get rid of extra fluid from your body. This lowers the volume of blood the heart has to pump.  Water pills (diuretics). Diuretics cause the kidneys to remove salt and water from the blood. The extra fluid is removed by urination. By removing extra fluid from the body, diuretics help lower the workload of the heart and help prevent fluid buildup in the lungs so breathing is easier.  Beta blockers. These prevent the heart from beating too fast and improve heart muscle strength. Beta blockers help maintain a normal heart rate, control blood pressure, and improve HF symptoms.  Digitalis. This increases the force of the heartbeat and may be helpful to people with HF or heart rhythm problems.  Healthy lifestyle changes include:  Stopping smoking.  Eating a healthy diet. Avoid foods high in fat. Avoid foods fried in oil or made with fat. A dietician can help with healthy food choices.  Limiting how much salt you eat.  Limiting alcohol intake to no more than 1 drink per day for women and 2 drinks per day for men. Drinking more than that is harmful to your heart. If your heart has already been damaged by alcohol or you have severe HF, drinking alcohol should be stopped completely.  Exercising as directed by your caregiver.  Surgical treatment for HF may include:  Procedures to open blocked arteries, repair damaged heart valves, or remove damaged heart muscle tissue.  A pacemaker to help heart muscle function and to  control certain abnormal heart rhythms.  A defibrillator to possibly prevent sudden cardiac death. HOME CARE INSTRUCTIONS   Activity level. Your caregiver can help you determine what type of exercise program may be helpful. It is important to maintain your strength. Pace your physical activity to avoid shortness of breath or chest pain. Rest for 1 hour before and after meals. A cardiac rehabilitation program may be helpful to some people with HF.  Diet. Eat a heart healthy diet. Food choices should be low in saturated fat and cholesterol. Talk to a dietician to learn about heart healthy foods.  Salt intake. When you have HF, you need to limit the amount of salt you eat. Eat less than 1500 milligrams (mg) of salt per day or as recommended by your caregiver.  Weight monitoring. Weigh yourself every day. You should weigh yourself in the morning after you urinate and before you eat breakfast. Wear the same amount of clothing each time you weigh yourself. Record your weight daily. Bring your recorded weights to your clinic visits. Tell your caregiver right away if you have gained 3 lb/1.4 kg in 1 day, or 5 lb/2.3 kg in a week or whatever amount you were told to  report.  Blood pressure monitoring. This should be done as directed by your caregiver. A home blood pressure cuff can be purchased at a drugstore. Record your blood pressure numbers and bring them to your clinic visits. Tell your caregiver if you become dizzy or lightheaded upon standing up.  Smoking. If you are currently a smoker, it is time to quit. Nicotine makes your heart work harder by causing your blood vessels to constrict. Do not use nicotine gum or patches before talking to your caregiver.  Follow up. Be sure to schedule a follow-up visit with your caregiver. Keep all your appointments. SEEK MEDICAL CARE IF:   Your weight increases by 3 lb/1.4 kg in 1 day or 5 lb/2.3 kg in a week.  You notice increasing shortness of breath that is  unusual for you. This may happen during rest, sleep, or with activity.  You cough more than normal, especially with physical activity.  You notice more swelling in your hands, feet, ankles, or belly (abdomen).  You are unable to sleep because it is hard to breathe.  You cough up bloody mucus (sputum).  You begin to feel "jumping" or "fluttering" sensations (palpitations) in your chest. SEEK IMMEDIATE MEDICAL CARE IF:   You have severe chest pain or pressure which may include symptoms such as:  Pain or pressure in the arms, neck, jaw, or back.  Feeling sweaty.  Feeling sick to your stomach (nauseous).  Feeling short of breath while at rest.  Having a fast or irregular heartbeat.  You experience stroke symptoms. These symptoms include:  Facial weakness or numbness.  Weakness or numbness in an arm, leg, or on one side of your body.  Blurred vision.  Difficulty talking or thinking.  Dizziness or fainting.  Severe headache. MAKE SURE YOU:   Understand these instructions.  Will watch your condition.  Will get help right away if you are not doing well or get worse. Document Released: 05/25/2005 Document Revised: 11/24/2011 Document Reviewed: 09/06/2009 [facility] Patient Information 2013 [place], [place].

## 2012-09-28 ENCOUNTER — Telehealth: Payer: Self-pay | Admitting: *Deleted

## 2012-09-28 NOTE — Telephone Encounter (Signed)
Pt called with c/o being dizzy, same as yesterday.  He is wondering how long it will take for his sodium to return to normal. He is not taking metolazone, the "kicker pill".    Pt states he fell today, no LOC, no known injuries. Pt has appointment this Friday.  His question is how long will it take to get rid of the dizziness, and is it tied to kidney function,. Pt # [id number]

## 2012-09-29 NOTE — Progress Notes (Signed)
Case discussed with Dr. Lavena Bullion at the time of the visit. We reviewed the resident's history and exam and pertinent patient test results. I agree with the assessment, diagnosis and plan of care documented in the resident's note.  Mr. Fritze' condition remains tenuous as he walks a fine line between compensated and decompensated cardiomyopathy.  At the present time, he may be slightly over diuresed from a hyponatremia and pre-renal azotemia standpoint.  That being said, we may have to tolerate some hyponatremia at baseline.  Will back off on the metolazone and see if we can improve the renal function.  He may eventually require inotropic therapy if we can not balance his symptoms while preserving creatinine.

## 2012-09-29 NOTE — Telephone Encounter (Signed)
Spoke with [name] today and he states that he is feeling better.  He states that the dizziness today is much improved and his weight is stable at 184-185.  He has been able to sleep flat at night.  He picked up the compression stockings and is happy to report that the swelling in his legs is MUCH improved, "I have ankles and knees again."  He has been taking the Torsemide 80 mg BID and holding the metolazone.  He states that he is watching his salt intake and drinking between 32 and 64 oz of water per day.  We discussed fluid restriction again which includes all liquids drank in a day.  We discussed that his goal was to keep around 1.5L or 48 oz of fluid per day.    Leodis Sias, MD Internal Medicine Resident, PGY III Co-Chief Resident, Internal Medicine Pager: [phone] 09/29/2012 1:41 PM

## 2012-09-30 ENCOUNTER — Encounter: Payer: Self-pay | Admitting: Radiation Oncology

## 2012-09-30 ENCOUNTER — Ambulatory Visit (INDEPENDENT_AMBULATORY_CARE_PROVIDER_SITE_OTHER): Payer: Medicare Other | Admitting: Radiation Oncology

## 2012-09-30 VITALS — BP 131/72 | HR 74 | Temp 97.1°F | Ht 67.0 in | Wt 188.9 lb

## 2012-09-30 DIAGNOSIS — I951 Orthostatic hypotension: Secondary | ICD-10-CM

## 2012-09-30 DIAGNOSIS — I509 Heart failure, unspecified: Secondary | ICD-10-CM

## 2012-09-30 DIAGNOSIS — E871 Hypo-osmolality and hyponatremia: Secondary | ICD-10-CM

## 2012-09-30 DIAGNOSIS — I5033 Acute on chronic diastolic (congestive) heart failure: Secondary | ICD-10-CM

## 2012-09-30 LAB — BASIC METABOLIC PANEL
BUN: 83 mg/dL — ABNORMAL HIGH (ref 6–23)
Chloride: 81 mEq/L — ABNORMAL LOW (ref 96–112)
Creat: 1.71 mg/dL — ABNORMAL HIGH (ref 0.50–1.35)
Glucose, Bld: 298 mg/dL — ABNORMAL HIGH (ref 70–99)
Potassium: 3.9 mEq/L (ref 3.5–5.3)

## 2012-09-30 LAB — CBC
MCH: 28 pg (ref 26.0–34.0)
MCV: 76.4 fL — ABNORMAL LOW (ref 78.0–100.0)
Platelets: 311 10*3/uL (ref 150–400)
RBC: 3.68 MIL/uL — ABNORMAL LOW (ref 4.22–5.81)
RDW: 12.4 % (ref 11.5–15.5)
WBC: 5.8 10*3/uL (ref 4.0–10.5)

## 2012-09-30 NOTE — Progress Notes (Signed)
Subjective:    Patient ID: [name], male    DOB: Apr 04, 1946, 67 y.o.   MRN: [id number]  HPI [name] is here today for followup of his recent symptoms of lightheadedness and recent hyponatremia after initially being seen for these issues 3 days ago.   Hyponatremia: Na = 122 on 4/22. Patient was contacted with this result and was instructed to discontinue metolazone. He continues to take torsemide as prescribed.   Lightheadedness: He states his lightheadedness/dizziness persists but is somewhat improved.   CHF: His weight remains stable at home at ~185 pounds at home. He denies any worsening shortness of breath. Denies worsened leg swellling.   Overall he states he feels approximately the same as he did on his previous visit 3 days ago and has no new complaints today. He has continued his recommended fluid and salt restricted diet.    Review of Systems  All other systems reviewed and are negative.   Current Outpatient Medications: Current Outpatient Prescriptions  Medication Sig Dispense Refill  . benazepril (LOTENSIN) 20 MG tablet Take 1 tablet (20 mg total) by mouth daily before breakfast.  30 tablet  3  . Blood Glucose Monitoring Suppl (FREESTYLE FREEDOM) KIT 1 each by Does not apply route as needed.      . Glucose Blood (FREESTYLE LITE TEST VI) 1 each 4 (four) times daily.      . insulin aspart (NOVOLOG) 100 UNIT/ML injection Take 5 units with each meal three times daily.  15 vial  12  . insulin glargine (LANTUS SOLOSTAR) 100 UNIT/ML injection Inject 0.2 mLs (20 Units total) into the skin at bedtime.  15 mL  12  . Insulin Pen Needle (PEN NEEDLES) 31G X 6 MM MISC Inject 1 each into the skin daily.  100 each  6  . mirtazapine (REMERON) 30 MG tablet Take 1 tablet (30 mg total) by mouth at bedtime.  30 tablet  1  . mycophenolate (CELLCEPT) 500 MG tablet Take 1,000 mg by mouth 2 (two) times daily.       Melene Muller ON 11/11/2012] oxyCODONE (OXY IR/ROXICODONE) 5 MG immediate release  tablet Take 1 tablet (5 mg total) by mouth every 4 (four) hours as needed. For pain  120 tablet  0  . pantoprazole (PROTONIX) 40 MG tablet Take 1 tablet (40 mg total) by mouth daily.  30 tablet  6  . predniSONE (DELTASONE) 5 MG tablet Take 15 mg by mouth daily.      Marland Kitchen pyridostigmine (MESTINON) 60 MG tablet Take 60 mg by mouth 5 (five) times daily. Medication starts to wear off closer to 2.5 hours      . torsemide (DEMADEX) 20 MG tablet Take 4 tablets (80 mg total) by mouth 2 (two) times daily.  240 tablet  1  . zolpidem (AMBIEN) 10 MG tablet Take 5 mg by mouth at bedtime.      . [DISCONTINUED] pantoprazole (PROTONIX) 40 MG tablet Take 1 tablet (40 mg total) by mouth daily.  30 tablet  2   No current facility-administered medications for this visit.    Allergies: Allergies  Allergen Reactions  . Aminoglycosides     MG patient  . Beta Adrenergic Blockers Other (See Comments)    MG patient  . Calcium Channel Blockers Other (See Comments)    MG patient  . Metoprolol Other (See Comments)    MG patient  . Neuromuscular Blocking Agents Other (See Comments)    MG patient  . Other Other (See  Comments)    Myasthenia Gravis patient  . Penicillins Other (See Comments)    MG  . Quinine Derivatives Other (See Comments)    MG patient     Past Medical History: Past Medical History  Diagnosis Date  . Sinus tachycardia 2006    a. Nonobstructive CAD by cath 2013. b. s/p atrial tachycardia ablation 08/2011 (sinus node modification)  . Orthostatic hypotension   . Unspecified essential hypertension   . Hyperlipidemia   . Cervical spondylosis with radiculopathy   . Syrinx     T5-T6  . Cord compression     Cord compression syndrome C5-C6, C6-C7  . ADD (attention deficit disorder)   . Decreased libido   . Depression   . Diabetes mellitus type 2, uncontrolled, with complications     CAD, orthostatic hypotension, erectile dysfunction  . Pulmonary nodule, left     Evaluated 04/2011 by  pulmonology - felt to be benign granuloma  . Peripheral edema   . Coronary artery disease, non-occlusive     Mild nonobstructive CAD by cath 08/2011  . Systolic congestive heart failure     By echo 2013 at [facility] - reported EF 45% per pt  . Colitis     with surface exudate  . Hemorrhoids     small  . Gastritis   . Clostridium difficile infection 02/15/2012    causing pseudomembranosu colitis  . Diastolic heart failure     grade 2 per echocardiogram (2011)  . Right bundle branch block and left posterior fascicular block   . Sleep apnea     occassionally uses cpap  . 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 Thymectomy  april 2012  . Colonoscopy  11/13/2011    Procedure: COLONOSCOPY;  Surgeon: Theda Belfast, MD;  Location: [facility] ENDOSCOPY;  Service: Endoscopy;  Laterality: N/A;  . Cardiac catheterization    . Esophagogastroduodenoscopy  05/13/2012    Procedure: ESOPHAGOGASTRODUODENOSCOPY (EGD);  Surgeon: Iva Boop, MD;  Location: [facility];  Service: Endoscopy;  Laterality: N/A;  needs PAT w/ dx of Myastenia gravis and multiple meds. Pt requested lidocaine to numb IV site prior to start.  Very needle phobic.    Family History: Family History  Problem Relation Age of Onset  . Breast cancer Mother   . Other Father     Beck's Sarcoid  . Colon cancer Neg Hx   . Prostate cancer Neg Hx     Social History: History   Social History  . Marital Status: Single    Spouse Name: N/A    Number of Children: 1  . Years of Education: N/A   Occupational History  . Retired     Surveyor, minerals   Social History Main Topics  . Smoking status: Former Smoker -- 2.50 packs/day for 30 years    Quit date: 06/09/1991  . Smokeless tobacco: Never Used  . Alcohol Use: No  . Drug Use: No  . Sexually Active: Not on file   Other Topics Concern  . Not on file   Social History  Narrative   Has a single man's lifestyle and will remind anyone of it when asked.   Prefers to see only male physicians   Retired Surveyor, minerals      Lives at home with mother (68 y.o)     Vital Signs: Blood pressure 131/72, pulse 74, temperature 97.1 F (36.2  C), temperature source Oral, height 5\' 7"  (1.702 m), weight 188 lb 14.4 oz (85.684 kg), SpO2 98.00%.    Past Surgical History: Past Surgical History  Procedure Laterality Date  . Thymectomy  april 2012  . Colonoscopy  11/13/2011    Procedure: COLONOSCOPY;  Surgeon: Theda Belfast, MD;  Location: [facility];  Service: Endoscopy;  Laterality: N/A;  . Cardiac catheterization    . Esophagogastroduodenoscopy  05/13/2012    Procedure: ESOPHAGOGASTRODUODENOSCOPY (EGD);  Surgeon: Iva Boop, MD;  Location: [facility];  Service: Endoscopy;  Laterality: N/A;  needs PAT w/ dx of Myastenia gravis and multiple meds. Pt requested lidocaine to numb IV site prior to start.  Very needle phobic.    Family History: Family History  Problem Relation Age of Onset  . Breast cancer Mother   . Other Father     Beck's Sarcoid  . Colon cancer Neg Hx   . Prostate cancer Neg Hx     Social History: History   Social History  . Marital Status: Single    Spouse Name: N/A    Number of Children: 1  . Years of Education: N/A   Occupational History  . Retired     Surveyor, minerals   Social History Main Topics  . Smoking status: Former Smoker -- 2.50 packs/day for 30 years    Quit date: 06/09/1991  . Smokeless tobacco: Never Used  . Alcohol Use: No  . Drug Use: No  . Sexually Active: Not on file   Other Topics Concern  . Not on file   Social History Narrative   Has a single man's lifestyle and will remind anyone of it when asked.   Prefers to see only male physicians   Retired Surveyor, minerals      Lives at home with mother (1 y.o)     Vital Signs: Blood pressure 131/72, pulse 74, temperature 97.1 F (36.2 C), temperature source Oral,  height 5\' 7"  (1.702 m), weight 188 lb 14.4 oz (85.684 kg), SpO2 98.00%.      Objective:   Physical Exam  Constitutional: He is oriented to person, place, and time. He appears well-developed and well-nourished. No distress.  HENT:  Head: Normocephalic and atraumatic.  Eyes: Conjunctivae are normal. Pupils are equal, round, and reactive to light. No scleral icterus.  Neck: Normal range of motion. Neck supple. No tracheal deviation present.  Cardiovascular: Normal rate and regular rhythm.   No murmur heard. Pulmonary/Chest: Effort normal. He has no wheezes. He has no rales.  Abdominal: Soft. Bowel sounds are normal. He exhibits no distension. There is no tenderness.  Musculoskeletal: Normal range of motion. He exhibits edema (2+ pitting edema in BLE. ).  Neurological: He is alert and oriented to person, place, and time. No cranial nerve deficit.  Skin: Skin is warm and dry. No erythema.  Psychiatric: He has a normal mood and affect. His behavior is normal.          Assessment & Plan:

## 2012-09-30 NOTE — Assessment & Plan Note (Signed)
Lightheadedness seems slightly improved although pt's complaints seem to vary depending on his mood. Likely 2/2 orthostasis from decreased EAV (though pt has refused orthostatic VS to be performed).  - continue to monitor

## 2012-09-30 NOTE — Assessment & Plan Note (Addendum)
Stable/slightly improved after metolazone was discontinued 3 days ago (Na 122=>123; corrected Na ~126=>127). Expect that this issue should continue to improve in the absence of metolazone from his drug regimen. - RTC in 1 week to reassess - cont torsemide   BMET    Component Value Date/Time   NA 123* 09/30/2012 0941   K 3.9 09/30/2012 0941   CL 81* 09/30/2012 0941   CO2 33* 09/30/2012 0941   GLUCOSE 298* 09/30/2012 0941   BUN 83* 09/30/2012 0941   CREATININE 1.71* 09/30/2012 0941   CALCIUM 9.6 09/30/2012 0941

## 2012-09-30 NOTE — Assessment & Plan Note (Addendum)
Clinically stable and symptoms seem overall somewhat improved since previous visit 3 days ago. Weight stable at ~188-189lbs. Creatinine is improved from previous visit after stopping metolazone, suggesting patient's recent elevation in Cr was more likely secondary to prerenal azotemia than cardiorenal syndrome. No changes in medications are indicated today.  - cont torsemide 80mg  bid - pt referred to CHF clinic on previous visit (appt pending) - RTC in 1 wk to f/u with PCP, Dr. Tonny Branch (pt instructed to contact us sooner if he has any weight gain >5 lbs or any worsening of his symptoms and to go to the ED if symptoms become severe)  Lab Results  Component Value Date   CREATININE 1.71* 09/30/2012

## 2012-09-30 NOTE — Patient Instructions (Addendum)
We will contact you with the results of your lab work. We may need to change the dose of your torsemide or make other changes in your medications if your sodium or other labs are more abnormal than on your last visit.   Please attend your appointment on 10/07/2012 with Dr. Tonny Branch.

## 2012-09-30 NOTE — Progress Notes (Signed)
Case discussed with Dr. McTyre at the time of the visit, immediately after the resident saw the patient.  I reviewed the resident's history and exam and pertinent patient test results.  I agree with the assessment, diagnosis and plan of care documented in the resident's note.  

## 2012-10-03 ENCOUNTER — Emergency Department (HOSPITAL_COMMUNITY): Payer: Medicare Other

## 2012-10-03 ENCOUNTER — Other Ambulatory Visit: Payer: Self-pay

## 2012-10-03 ENCOUNTER — Inpatient Hospital Stay (HOSPITAL_COMMUNITY)
Admission: EM | Admit: 2012-10-03 | Discharge: 2012-10-07 | DRG: 292 | Disposition: A | Discharge status: Home Health | Payer: Medicare Other | Attending: Internal Medicine | Admitting: Internal Medicine

## 2012-10-03 ENCOUNTER — Encounter (HOSPITAL_COMMUNITY): Payer: Self-pay | Admitting: Emergency Medicine

## 2012-10-03 DIAGNOSIS — F3289 Other specified depressive episodes: Secondary | ICD-10-CM | POA: Diagnosis present

## 2012-10-03 DIAGNOSIS — R809 Proteinuria, unspecified: Secondary | ICD-10-CM | POA: Diagnosis present

## 2012-10-03 DIAGNOSIS — I129 Hypertensive chronic kidney disease with stage 1 through stage 4 chronic kidney disease, or unspecified chronic kidney disease: Secondary | ICD-10-CM | POA: Diagnosis present

## 2012-10-03 DIAGNOSIS — J449 Chronic obstructive pulmonary disease, unspecified: Secondary | ICD-10-CM | POA: Diagnosis present

## 2012-10-03 DIAGNOSIS — F329 Major depressive disorder, single episode, unspecified: Secondary | ICD-10-CM | POA: Diagnosis present

## 2012-10-03 DIAGNOSIS — F988 Other specified behavioral and emotional disorders with onset usually occurring in childhood and adolescence: Secondary | ICD-10-CM | POA: Diagnosis present

## 2012-10-03 DIAGNOSIS — J4489 Other specified chronic obstructive pulmonary disease: Secondary | ICD-10-CM | POA: Diagnosis present

## 2012-10-03 DIAGNOSIS — D649 Anemia, unspecified: Secondary | ICD-10-CM | POA: Diagnosis present

## 2012-10-03 DIAGNOSIS — I10 Essential (primary) hypertension: Secondary | ICD-10-CM | POA: Diagnosis present

## 2012-10-03 DIAGNOSIS — E871 Hypo-osmolality and hyponatremia: Secondary | ICD-10-CM | POA: Diagnosis present

## 2012-10-03 DIAGNOSIS — I2510 Atherosclerotic heart disease of native coronary artery without angina pectoris: Secondary | ICD-10-CM | POA: Diagnosis present

## 2012-10-03 DIAGNOSIS — Z6829 Body mass index (BMI) 29.0-29.9, adult: Secondary | ICD-10-CM

## 2012-10-03 DIAGNOSIS — G4700 Insomnia, unspecified: Secondary | ICD-10-CM

## 2012-10-03 DIAGNOSIS — E1121 Type 2 diabetes mellitus with diabetic nephropathy: Secondary | ICD-10-CM

## 2012-10-03 DIAGNOSIS — N183 Chronic kidney disease, stage 3 unspecified: Secondary | ICD-10-CM | POA: Diagnosis present

## 2012-10-03 DIAGNOSIS — R609 Edema, unspecified: Secondary | ICD-10-CM

## 2012-10-03 DIAGNOSIS — E1129 Type 2 diabetes mellitus with other diabetic kidney complication: Secondary | ICD-10-CM | POA: Diagnosis present

## 2012-10-03 DIAGNOSIS — N058 Unspecified nephritic syndrome with other morphologic changes: Secondary | ICD-10-CM | POA: Diagnosis present

## 2012-10-03 DIAGNOSIS — E669 Obesity, unspecified: Secondary | ICD-10-CM | POA: Diagnosis present

## 2012-10-03 DIAGNOSIS — I4891 Unspecified atrial fibrillation: Secondary | ICD-10-CM | POA: Diagnosis present

## 2012-10-03 DIAGNOSIS — Z79899 Other long term (current) drug therapy: Secondary | ICD-10-CM

## 2012-10-03 DIAGNOSIS — I5033 Acute on chronic diastolic (congestive) heart failure: Principal | ICD-10-CM | POA: Diagnosis present

## 2012-10-03 DIAGNOSIS — E1165 Type 2 diabetes mellitus with hyperglycemia: Secondary | ICD-10-CM

## 2012-10-03 DIAGNOSIS — Z91199 Patient's noncompliance with other medical treatment and regimen due to unspecified reason: Secondary | ICD-10-CM

## 2012-10-03 DIAGNOSIS — Z9119 Patient's noncompliance with other medical treatment and regimen: Secondary | ICD-10-CM

## 2012-10-03 DIAGNOSIS — G7000 Myasthenia gravis without (acute) exacerbation: Secondary | ICD-10-CM | POA: Diagnosis present

## 2012-10-03 DIAGNOSIS — E878 Other disorders of electrolyte and fluid balance, not elsewhere classified: Secondary | ICD-10-CM | POA: Diagnosis present

## 2012-10-03 DIAGNOSIS — IMO0002 Reserved for concepts with insufficient information to code with codable children: Secondary | ICD-10-CM

## 2012-10-03 DIAGNOSIS — E785 Hyperlipidemia, unspecified: Secondary | ICD-10-CM | POA: Diagnosis present

## 2012-10-03 DIAGNOSIS — M47812 Spondylosis without myelopathy or radiculopathy, cervical region: Secondary | ICD-10-CM | POA: Diagnosis present

## 2012-10-03 DIAGNOSIS — I509 Heart failure, unspecified: Secondary | ICD-10-CM | POA: Diagnosis present

## 2012-10-03 LAB — POCT I-STAT, CHEM 8
Calcium, Ion: 1.11 mmol/L — ABNORMAL LOW (ref 1.13–1.30)
Chloride: 81 mEq/L — ABNORMAL LOW (ref 96–112)
Creatinine, Ser: 1.8 mg/dL — ABNORMAL HIGH (ref 0.50–1.35)
Glucose, Bld: 267 mg/dL — ABNORMAL HIGH (ref 70–99)
HCT: 31 % — ABNORMAL LOW (ref 39.0–52.0)
Potassium: 3.7 mEq/L (ref 3.5–5.1)

## 2012-10-03 LAB — URINALYSIS, ROUTINE W REFLEX MICROSCOPIC
Bilirubin Urine: NEGATIVE
Glucose, UA: NEGATIVE mg/dL
Ketones, ur: NEGATIVE mg/dL
Nitrite: NEGATIVE
Specific Gravity, Urine: 1.011 (ref 1.005–1.030)
pH: 5 (ref 5.0–8.0)

## 2012-10-03 LAB — CBC
HCT: 28.1 % — ABNORMAL LOW (ref 39.0–52.0)
MCH: 27.9 pg (ref 26.0–34.0)
MCHC: 37.4 g/dL — ABNORMAL HIGH (ref 30.0–36.0)
RDW: 12.3 % (ref 11.5–15.5)

## 2012-10-03 LAB — BASIC METABOLIC PANEL
BUN: 88 mg/dL — ABNORMAL HIGH (ref 6–23)
Calcium: 9.8 mg/dL (ref 8.4–10.5)
GFR calc Af Amer: 50 mL/min — ABNORMAL LOW (ref >90)
GFR calc non Af Amer: 43 mL/min — ABNORMAL LOW (ref >90)
Potassium: 3.7 mEq/L (ref 3.5–5.1)
Sodium: 122 mEq/L — ABNORMAL LOW (ref 135–145)

## 2012-10-03 LAB — URINE MICROSCOPIC-ADD ON

## 2012-10-03 LAB — POCT I-STAT TROPONIN I

## 2012-10-03 NOTE — ED Notes (Signed)
Pt up walking around sts that he will not go up on a stretcher.

## 2012-10-03 NOTE — ED Notes (Signed)
Notified Consulting civil engineer and Triage RN of elevated i stat troponin results.  Pt. To be moved to a room as soon as room is cleared.

## 2012-10-03 NOTE — H&P (Signed)
Hospital Admission Note Date: 10/03/2012  Patient name: [name] Medical record number: [id number] Date of birth: January 11, 1946 Age: 67 y.o. Gender: male PCP: PRIBULA,CHRISTOPHER, MD  Medical Service: Internal Medicine  Attending physician: Dr. Kem Kays     1st Contact: Dr. Earlene Plater [phone] 2nd Contact: Dr. Manson Passey Pager:[phone] After 5 pm or weekends: 1st Contact: Pager: [phone] 2nd Contact: Pager: [phone]  Chief Complaint: Lightheadedness,  worsening leg swelling, "too much water on me"  History of Present Illness: 67 year old male history of chronic diastolic heart failure (grade 3), history of peripheral edema, uncontrolled diabetes, HTN presents for worsening lower extremity edema (he has not elevated his feet and has not had his compression stockings for 2 days), decreased urine output with Torsemide alone and lightheadedness. Other associated symptoms orthopnea (sleeps on 3 pillows or on his desk), and increase in labored breathing.  His dry weight is 188 lbs. He is not having adequate urine output with Torsemide alone.  He cut down his fluid intake to 2L or less per day.  His cardiologist is in [place].     Meds: Current Outpatient Rx  Name  Route  Sig  Dispense  Refill  . mirtazapine (REMERON) 30 MG tablet   Oral   Take 1 tablet (30 mg total) by mouth at bedtime.   30 tablet   1   . mycophenolate (CELLCEPT) 500 MG tablet   Oral   Take 1,000 mg by mouth 2 (two) times daily.          . naproxen sodium (ANAPROX) 220 MG tablet   Oral   Take 440 mg by mouth 2 (two) times daily with a meal.         . oxyCODONE (OXY IR/ROXICODONE) 5 MG immediate release tablet   Oral   Take 1 tablet (5 mg total) by mouth every 4 (four) hours as needed. For pain   120 tablet   0   . pantoprazole (PROTONIX) 40 MG tablet   Oral   Take 1 tablet (40 mg total) by mouth daily.   30 tablet   6   . predniSONE (DELTASONE) 5 MG tablet   Oral   Take 15 mg by mouth daily.          Marland Kitchen pyridostigmine (MESTINON) 60 MG tablet   Oral   Take 60 mg by mouth 5 (five) times daily. Medication starts to wear off closer to 2.5 hours         . zolpidem (AMBIEN) 10 MG tablet   Oral   Take 5 mg by mouth at bedtime.           Allergies: Allergies as of 10/03/2012 - Review Complete 10/03/2012  Allergen Reaction Noted  . Aminoglycosides Other (See Comments) 04/14/2011  . Beta adrenergic blockers Other (See Comments)   . Calcium channel blockers Other (See Comments)   . Metoprolol Other (See Comments) 09/12/2011  . Neuromuscular blocking agents Other (See Comments) 04/14/2011  . Other Other (See Comments) 09/12/2011  . Penicillins Other (See Comments) 09/12/2011  . Quinine derivatives Other (See Comments) 09/12/2011   Past Medical History  Diagnosis Date  . Sinus tachycardia 2006    a. Nonobstructive CAD by cath 2013. b. s/p atrial tachycardia ablation 08/2011 (sinus node modification)  . Orthostatic hypotension   . Unspecified essential hypertension   . Hyperlipidemia   . Cervical spondylosis with radiculopathy   . Syrinx     T5-T6  . Cord compression     Cord compression syndrome  C5-C6, C6-C7  . ADD (attention deficit disorder)   . Decreased libido   . Depression   . Diabetes mellitus type 2, uncontrolled, with complications     CAD, orthostatic hypotension, erectile dysfunction  . Pulmonary nodule, left     Evaluated 04/2011 by pulmonology - felt to be benign granuloma  . Peripheral edema   . Coronary artery disease, non-occlusive     Mild nonobstructive CAD by cath 08/2011  . Systolic congestive heart failure     By echo 2013 at [facility] - reported EF 45% per pt  . Colitis     with surface exudate  . Hemorrhoids     small  . Gastritis   . Clostridium difficile infection 02/15/2012    causing pseudomembranosu colitis  . Diastolic heart failure     grade 2 per echocardiogram (2011)  . Right bundle branch block and left posterior fascicular block   . Sleep  apnea     occassionally uses cpap  . 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 Thymectomy  april 2012  . Colonoscopy  11/13/2011    Procedure: COLONOSCOPY;  Surgeon: Theda Belfast, MD;  Location: [facility];  Service: Endoscopy;  Laterality: N/A;  . Cardiac catheterization    . Esophagogastroduodenoscopy  05/13/2012    Procedure: ESOPHAGOGASTRODUODENOSCOPY (EGD);  Surgeon: Iva Boop, MD;  Location: [facility];  Service: Endoscopy;  Laterality: N/A;  needs PAT w/ dx of Myastenia gravis and multiple meds. Pt requested lidocaine to numb IV site prior to start.  Very needle phobic.   Family History  Problem Relation Age of Onset  . Breast cancer Mother   . Other Father     Beck's Sarcoid  . Colon cancer Neg Hx   . Prostate cancer Neg Hx    History   Social History  . Marital Status: Single    Spouse Name: N/A    Number of Children: 1  . Years of Education: N/A   Occupational History  . Retired     Surveyor, minerals   Social History Main Topics  . Smoking status: Former Smoker -- 2.50 packs/day for 30 years    Quit date: 06/09/1991  . Smokeless tobacco: Never Used  . Alcohol Use: No  . Drug Use: No  . Sexually Active: Not on file   Other Topics Concern  . Not on file   Social History Narrative   Has a single man's lifestyle and will remind anyone of it when asked.   Prefers to see only male physicians   Retired Surveyor, minerals      Lives at home with mother (49 y.o)   Has 1 son    Review of Systems: General: denies fever, chills HEENT: not assessed  CV: +orthopnea, denies chest pain Lungs: +labored breathing Ab/GU: denies abdominal pain or constipation or nausea or vomiting or abdominal distension, decreased urine output with Torsemide alone; denies blood in urine or stool  Extremities: +lower extremity edema (worsening)  Neuro: +lightheadedness  Physical Exam: HR  78-80 Blood pressure 170/141, pulse 51, temperature 97.6 F (36.4 C), temperature source Oral, resp. rate 26, SpO2 98.00%. General: sitting up by bed, nad, alert and oriented x 3  HEENT: [place]/at, wet oral mucosa  CV: RRR, no murmurs  Lungs: min. Crackles b/l  Ab/GU: obese, soft, ntnd, nl bowel sounds  Extremities: 2 to 3+ lower extremity pitting edema, warm no cyanosis  or edema  Neuro: alert and oriented x 3, moving all 4 extremities   Lab results: Basic Metabolic Panel:  Recent Labs  16/10/96 1849 10/03/12 1914  NA 122* 121*  K 3.7 3.7  CL 79* 81*  CO2 29  --   GLUCOSE 285* 267*  BUN 88* 96*  CREATININE 1.59* 1.80*  CALCIUM 9.8  --    CBC:  Recent Labs  10/03/12 1849 10/03/12 1914  WBC 8.4  --   HGB 10.5* 10.5*  HCT 28.1* 31.0*  MCV 74.5*  --   PLT 317  --    BNP:  Recent Labs  10/03/12 1849  PROBNP 3056.0*   CBG: No results found for this basename: GLUCAP,  in the last 72 hours Urinalysis:  Recent Labs  10/03/12 2010  COLORURINE YELLOW  LABSPEC 1.011  PHURINE 5.0  GLUCOSEU NEGATIVE  HGBUR SMALL*  BILIRUBINUR NEGATIVE  KETONESUR NEGATIVE  PROTEINUR 100*  UROBILINOGEN 0.2  NITRITE NEGATIVE  LEUKOCYTESUR NEGATIVE    Misc. Labs: Mag  Phos  Trop  Imaging results:  Dg Chest 2 View  10/03/2012  *RADIOLOGY REPORT*  Clinical Data: Short of breath.  Weight gain.  Fluid retention.  CHEST - 2 VIEW  Comparison: 09/16/2012.  Findings: Median sternotomy wires are present.  Cardiopericardial silhouette is within normal limits for projection.  Small right pleural effusion is present.  Interstitial pulmonary edema is present with Charyl Dancer B lines at the bases. No pneumothorax.  IMPRESSION: Mild CHF.   Original Report Authenticated By: Andreas Newport, M.D.     Other results: EKG: reg rate, irregular rhythm, AF, normal axis, nl PR and QRS, T wave inversions in AVR, V1, V3, no ST changes, no LVH  09/06/12 echo  Study Conclusions  - Left ventricle: The cavity  size was normal. Wall thickness was increased in a pattern of mild LVH. There was mild focal basal hypertrophy of the septum, with an appearance suggesting concentric remodeling (increased wall thickness with normal wall mass). Systolic function was vigorous. The estimated ejection fraction was in the range of 65% to 70%. Although no diagnostic regional wall motion abnormality was identified, this possibility cannot be completely excluded on the basis of this study. Doppler parameters are consistent with a reversible restrictive pattern, indicative of decreased left ventricular diastolic compliance and/or increased left atrial pressure (grade 3 diastolic dysfunction). Doppler parameters are consistent with both elevated ventricular end-diastolic filling pressure and elevated left atrial filling pressure. - Procedure narrative: Transthoracic echocardiography. Image quality was adequate, but doppler data was inadequate for assessment of either Aortic or Mitral stenosis. - Aortic valve: Cusp separation was reduced. Valve mobility was mildly restricted. - Mitral valve: Moderately calcified annulus. Mildly calcified leaflets . Mild focal calcification and tethering of the anterior leaflet, with involvement of chords. - Left atrium: The atrium was severely dilated. - Right ventricle: The cavity size was mildly dilated. Wall thickness was normal. - Right atrium: The atrium was mildly dilated. - Pulmonary arteries: PA peak pressure: 59mm Hg (S). Impressions:  - Occaisional ectopy was noted on this study, making this study technically difficult for the assessment of cardiac function. The lack of adequate doppler data makes assessment of Aortic or Mitral stenosis impossible. The right ventricular systolic pressure was increased consistent with moderate to severe pulmonary hypertension.   Assessment & Plan by Problem: 67 year old male history of chronic diastolic heart failure (grade  3), history of peripheral edema, uncontrolled diabetes, HTN presents for worsening lower extremity edema and lightheadedness  1. Acute  on chronic diastolic (grade 3) CHF with history of multiple admissions -proBNP 3056.0. Trop i 0.10 (elevated). CXR with mild CHF.  Weight is 188 pounds and the patient states his dry weight is 188.   -Recently d/c Metolazone 5 mg bid Monday and Friday due to hypoNA. No longer taking Lasix outpatient or Benazepril.  Patient was taking Torsemide 80 mg bid.  -will Rx Lasix 120 bid iv -Strict i/o, daily weights, fluid restrict -pending orthostatics  -will trend troponins  -Consider evaluation by heart failure team again for appropriated medication management in the am. His primary cardiologist is in [facility]   2. DM 2 uncontrolled  -HA1C 11.9 07/2012  -Hyperglycemic this admission. He is not taking his Lantus at home prior to admission -Monitor cbg, SSI qhs and with meals tid   3. HTN -Monitor BP.  BP on admission was 128/70 -Patient was not taking Benazepril or Lotensin outpatient  -Added Aspirin 81 mg qd   4. Chronic kidney disease -Creatinine 1.59 on admission then 1.80.  Creatinine baseline 1.3-1.4  -trend BMET with diuresis   5. Normocytic anemia  -Hbg 10.5 on admission.  Baseline Hbg 11-12.   -trend CBC   6. Myastenia gravis  -Continue home medication Mestinon 60 mg fives times a day q 2.5 hours per patient   7. F/E/N -Asymptomatic Hyponatremia (121) likely due to heart failure.  -Hypochloremia (81)  -will monitor and replace electrolytes prn  -carb modified diet   8.DVT px  -Heparin sq  Dispo: Disposition is deferred at this time, awaiting improvement of current medical problems. Anticipated discharge in approximately 1-2 day(s).   The patient does have a current PCP (PRIBULA,CHRISTOPHER, MD), therefore will be requiring OPC follow-up after discharge.   The patient does not have transportation limitations that hinder transportation to  clinic appointments.  SignedAnnett Gula [phone] 10/03/2012, 11:49 PM

## 2012-10-03 NOTE — ED Notes (Signed)
Internal med mds at bedside for eval.  Pt given [place] sandwich meal.

## 2012-10-03 NOTE — ED Notes (Signed)
Pt up in room walking around.  Pt refusing to sit down wanting to walk around the room.

## 2012-10-03 NOTE — ED Notes (Signed)
Pt alert, NAD, calm, interactive, resps e/u, speaking in clear complete sentences, VS rechecked, sitting in w/c, back to room A3.

## 2012-10-03 NOTE — ED Notes (Signed)
Pt here c/o increased SOB and fluid retention and pt thinks electrolytes could be abnormal; pt sts recently switched fluid pill and not producing as much urine

## 2012-10-03 NOTE — ED Notes (Signed)
Pt up walking.  sts he cannot sit down on the bed. md aware.

## 2012-10-04 ENCOUNTER — Encounter (HOSPITAL_COMMUNITY): Payer: Self-pay | Admitting: Nurse Practitioner

## 2012-10-04 DIAGNOSIS — I129 Hypertensive chronic kidney disease with stage 1 through stage 4 chronic kidney disease, or unspecified chronic kidney disease: Secondary | ICD-10-CM

## 2012-10-04 DIAGNOSIS — I509 Heart failure, unspecified: Secondary | ICD-10-CM | POA: Diagnosis present

## 2012-10-04 DIAGNOSIS — R609 Edema, unspecified: Secondary | ICD-10-CM

## 2012-10-04 LAB — BASIC METABOLIC PANEL
Chloride: 79 mEq/L — ABNORMAL LOW (ref 96–112)
GFR calc Af Amer: 58 mL/min — ABNORMAL LOW (ref >90)
GFR calc non Af Amer: 50 mL/min — ABNORMAL LOW (ref >90)
Glucose, Bld: 260 mg/dL — ABNORMAL HIGH (ref 70–99)
Potassium: 3.9 mEq/L (ref 3.5–5.1)
Sodium: 120 mEq/L — ABNORMAL LOW (ref 135–145)

## 2012-10-04 LAB — TROPONIN I: Troponin I: <0.3 ng/mL (ref <0.30)

## 2012-10-04 LAB — GLUCOSE, CAPILLARY: Glucose-Capillary: 392 mg/dL — ABNORMAL HIGH (ref 70–99)

## 2012-10-04 MED ORDER — BIOTENE DRY MOUTH MT LIQD: 15.0000 mL | OROMUCOSAL | Status: DC | PRN

## 2012-10-04 MED ORDER — METOLAZONE 5 MG PO TABS
5.0000 mg | ORAL_TABLET | Freq: Two times a day (BID) | ORAL | Status: DC
  Administered 2012-10-04 – 2012-10-07 (×7): 5 mg via ORAL
  Filled 2012-10-04 (×8): qty 1

## 2012-10-04 MED ORDER — OXYCODONE HCL 5 MG PO TABS
5.0000 mg | ORAL_TABLET | ORAL | Status: DC | PRN
  Administered 2012-10-04 – 2012-10-06 (×4): 5 mg via ORAL
  Filled 2012-10-04 (×4): qty 1

## 2012-10-04 MED ORDER — ASPIRIN EC 81 MG PO TBEC
81.0000 mg | DELAYED_RELEASE_TABLET | Freq: Every day | ORAL | Status: DC
  Administered 2012-10-04 – 2012-10-07 (×4): 81 mg via ORAL
  Filled 2012-10-04 (×4): qty 1

## 2012-10-04 MED ORDER — INSULIN ASPART 100 UNIT/ML ~~LOC~~ SOLN
0.0000 [IU] | Freq: Three times a day (TID) | SUBCUTANEOUS | Status: DC
  Administered 2012-10-06: 9 [IU] via SUBCUTANEOUS
  Administered 2012-10-06: 5 [IU] via SUBCUTANEOUS
  Administered 2012-10-07: 9 [IU] via SUBCUTANEOUS

## 2012-10-04 MED ORDER — SODIUM CHLORIDE 0.9 % IJ SOLN: 3.0000 mL | INTRAMUSCULAR | Status: DC | PRN

## 2012-10-04 MED ORDER — PREDNISONE 5 MG PO TABS
15.0000 mg | ORAL_TABLET | Freq: Every day | ORAL | Status: DC
  Filled 2012-10-04 (×4): qty 1

## 2012-10-04 MED ORDER — BENAZEPRIL HCL 20 MG PO TABS
20.0000 mg | ORAL_TABLET | Freq: Every day | ORAL | Status: DC
  Administered 2012-10-04: 20 mg via ORAL
  Filled 2012-10-04 (×4): qty 1

## 2012-10-04 MED ORDER — FUROSEMIDE 10 MG/ML IJ SOLN
30.0000 mg/h | INTRAVENOUS | Status: DC
  Administered 2012-10-04: 20 mg/h via INTRAVENOUS
  Administered 2012-10-05 (×3): 30 mg/h via INTRAVENOUS
  Administered 2012-10-05: 20 mg/h via INTRAVENOUS
  Administered 2012-10-06 – 2012-10-07 (×3): 30 mg/h via INTRAVENOUS
  Filled 2012-10-04 (×13): qty 25

## 2012-10-04 MED ORDER — INSULIN ASPART 100 UNIT/ML ~~LOC~~ SOLN: 0.0000 [IU] | Freq: Every day | SUBCUTANEOUS | Status: DC

## 2012-10-04 MED ORDER — MIRTAZAPINE 30 MG PO TABS
30.0000 mg | ORAL_TABLET | Freq: Every day | ORAL | Status: DC
  Administered 2012-10-04 – 2012-10-06 (×4): 30 mg via ORAL
  Filled 2012-10-04 (×5): qty 1

## 2012-10-04 MED ORDER — HEPARIN SODIUM (PORCINE) 5000 UNIT/ML IJ SOLN
5000.0000 [IU] | Freq: Three times a day (TID) | INTRAMUSCULAR | Status: DC
  Administered 2012-10-06: 5000 [IU] via SUBCUTANEOUS
  Filled 2012-10-04 (×13): qty 1

## 2012-10-04 MED ORDER — FUROSEMIDE 10 MG/ML IJ SOLN
120.0000 mg | Freq: Two times a day (BID) | INTRAVENOUS | Status: DC
  Administered 2012-10-04: 120 mg via INTRAVENOUS
  Filled 2012-10-04 (×2): qty 12

## 2012-10-04 MED ORDER — SODIUM CHLORIDE 0.9 % IJ SOLN
3.0000 mL | Freq: Two times a day (BID) | INTRAMUSCULAR | Status: DC
  Administered 2012-10-04 – 2012-10-07 (×4): 3 mL via INTRAVENOUS

## 2012-10-04 MED ORDER — SODIUM CHLORIDE 0.9 % IV SOLN
250.0000 mL | INTRAVENOUS | Status: DC | PRN
  Administered 2012-10-06: 250 mL via INTRAVENOUS

## 2012-10-04 MED ORDER — ZOLPIDEM TARTRATE 5 MG PO TABS
5.0000 mg | ORAL_TABLET | Freq: Every day | ORAL | Status: DC
  Administered 2012-10-04 – 2012-10-06 (×4): 5 mg via ORAL
  Filled 2012-10-04 (×4): qty 1

## 2012-10-04 MED ORDER — PYRIDOSTIGMINE BROMIDE 60 MG PO TABS
60.0000 mg | ORAL_TABLET | Freq: Three times a day (TID) | ORAL | Status: DC | PRN
  Filled 2012-10-04: qty 1

## 2012-10-04 MED ORDER — PYRIDOSTIGMINE BROMIDE 60 MG PO TABS
60.0000 mg | ORAL_TABLET | Freq: Every day | ORAL | Status: DC
Start: 2012-10-04 — End: 2012-10-07
  Administered 2012-10-04 – 2012-10-07 (×18): 60 mg via ORAL
  Filled 2012-10-04 (×22): qty 1

## 2012-10-04 MED ORDER — MILRINONE IN DEXTROSE 20 MG/100ML IV SOLN
0.2500 ug/kg/min | INTRAVENOUS | Status: DC
  Administered 2012-10-04 – 2012-10-07 (×4): 0.25 ug/kg/min via INTRAVENOUS
  Filled 2012-10-04 (×5): qty 100

## 2012-10-04 MED ORDER — MYCOPHENOLATE MOFETIL 250 MG PO CAPS
1000.0000 mg | ORAL_CAPSULE | Freq: Two times a day (BID) | ORAL | Status: DC
  Administered 2012-10-04 – 2012-10-07 (×8): 1000 mg via ORAL
  Filled 2012-10-04 (×10): qty 4

## 2012-10-04 MED ORDER — PANTOPRAZOLE SODIUM 40 MG PO TBEC
40.0000 mg | DELAYED_RELEASE_TABLET | Freq: Every day | ORAL | Status: DC
  Administered 2012-10-04 – 2012-10-06 (×3): 40 mg via ORAL
  Filled 2012-10-04 (×2): qty 1

## 2012-10-04 MED ORDER — SODIUM CHLORIDE 0.9 % IJ SOLN
3.0000 mL | Freq: Two times a day (BID) | INTRAMUSCULAR | Status: DC
  Administered 2012-10-04 – 2012-10-07 (×7): 3 mL via INTRAVENOUS

## 2012-10-04 NOTE — Progress Notes (Signed)
Subjective:    Interval Events:  No events overnight.  Patient feels well.  No dyspnea.    Objective:    Vital Signs:   Filed Vitals:   10/04/12 4098 10/04/12 0635 10/04/12 0636 10/04/12 0942  BP: 157/67 150/68 136/65 171/83  Pulse: 71 65  78  Temp: 98.1 F (36.7 C)     TempSrc: Oral     Resp:    20  Height:      Weight:   191 lb 14.4 oz (87.045 kg)   SpO2:    98%    Weights: [employer]   10/04/12 0005 10/04/12 0636  Weight: 193 lb 5.5 oz (87.7 kg) 191 lb 14.4 oz (87.045 kg)    Intake/Output:   Gross per 24 hour  Intake   1203 ml  Output   1275 ml  Net    -72 ml   Last BM Date: 10/03/12   Physical Exam: GENERAL: alert and oriented; standing in room, in no distress LUNGS: clear to auscultation bilaterally, normal work of breathing HEART: normal rate; regular rhythm ABDOMEN: soft, non-tender, normal bowel sounds, no masses palpated EXTREMITIES: 2+ edema SKIN: normal turgor    Labs: Basic Metabolic Panel: Lab 09/27/12 1423 09/30/12 0941 10/03/12 1849 10/03/12 1914 10/04/12 0004 10/04/12 0600  NA 122* 123* 122* 121*  --  120*  K 3.9 3.9 3.7 3.7  --  3.9  CL 79* 81* 79* 81*  --  79*  CO2 31 33* 29  --   --  30  GLUCOSE 322* 298* 285* 267*  --  260*  BUN 79* 83* 88* 96*  --  87*  CREATININE 2.11* 1.71* 1.59* 1.80*  --  1.41*  CALCIUM 9.5 9.6 9.8  --   --  9.6  MG  --   --   --   --  1.9  --   PHOS  --   --   --   --  3.8  --     CBC: Lab 09/27/12 1423 09/30/12 0947 10/03/12 1849 10/03/12 1914  WBC 7.6 5.8 8.4  --   HGB 10.0* 10.3* 10.5* 10.5*  HCT 27.5* 28.1* 28.1* 31.0*  MCV 76.0* 76.4* 74.5*  --   PLT 311 311 317  --     Cardiac Enzymes: Lab 10/04/12 0004 10/04/12 0600  TROPONINI <0.30 <0.30    BNP (last 3 results):  09/05/12 1920 09/16/12 1639 10/03/12 1849  PROBNP 2519.0* 2865.0* 3056.0*    CBG: Lab 09/27/12 1424 10/04/12 0638  GLUCAP 317* 268*       Medications:    Infusions: . furosemide (LASIX)  infusion    . milrinone       Scheduled Medications: . aspirin EC  81 mg Oral Daily  . heparin  5,000 Units Subcutaneous Q8H  . insulin aspart  0-5 Units Subcutaneous QHS  . insulin aspart  0-9 Units Subcutaneous TID WC  . metolazone  5 mg Oral BID  . mirtazapine  30 mg Oral QHS  . mycophenolate  1,000 mg Oral BID  . pantoprazole  40 mg Oral Daily  . predniSONE  15 mg Oral Daily  . pyridostigmine  60 mg Oral 5 X Daily  . sodium chloride  3 mL Intravenous Q12H  . sodium chloride  3 mL Intravenous Q12H  . zolpidem  5 mg Oral QHS     PRN Medications: sodium chloride, oxyCODONE, sodium chloride    Assessment/ Plan:    1.   Acute exacerbation  of chronic diastolic CHF:  An echocardiogram from 2011 demonstrated normal systolic function with grade 2 diastolic dysfunction. The echocardiogram performed earlier this month demonstrated vigorous systolic function with an EF of 65-70% but grade 3 diastolic dysfunction.  This is complicated by near nephrotic range proteinuria secondary to diabetic nephropathy.  Recently, he was on torsemide 80 mg BID but his metolazone was recently stopped.  We will diurese with IV infusions of milrinone and furosemide.  We are adding back oral metolazone.  Target weight is 180 to 185 lbs. - Start IV furosemide at 20 mg/hr continuous infusion - Restart PO metolazone 5 mg BID - Start milrinone at 0.25 mcg/kg/hr (6.5 mL/hr) - Strict I&Os - Daily weights - Daily BMET  2.   Diabetic nephropathy with proteinuria:  Urine protein to creatinine ratio was measured during a recent hospitalization:  it was 2.56. This is complicating his fluid status and management of CHF. We will diurese as noted above.  3.   Hyponatremia:  This is been a problem for some time.   Sodium is currently 120.  Diurese as above.  4.   Stage III CKD:  Back in November and December of 2013 his creatinine was normal. It has been steadily in the 1.5-1.7 range this year. Given the findings from  the echocardiogram and the elevated urine protein-to-creatinine ratio, I think progression of diabetic nephropathy is the primary cause; he is not yet in nephrotic range proteinuria but is spilling significant protein into his urine. Creatinine currently 1.41.  5.   Type II diabetes mellitus, uncontrolled, with complications:  Patient has persistently refused to allow [place] to treat his diabetes. Despite trying to educate him over and over again, he does not want his sugars to fall below 300.  His last hemoglobin A1c was 11.9 in February. - Continue sensitive scale correctional insulin aspart  6.   Hypertension:  In addition to diuretics, he takes benazepril 20 mg daily. Over the past year his blood pressure has averaged about 150/85. His blood pressures have been running in a similar range here in the hospital.  We will continue the benazepril here. - Continue benazepril 20 mg daily   7.   Myasthenia gravis: Managed at [facility] for this. Stable on his home regimen, which we have continued here.  - Continue mycophenolate 1000 mg twice a day  - Continue prednisone 15 mg daily  - Continue pyridostigmine 60mg  5 times daily  8.   Prophylaxis:  Heparin 5,000 units [place] TID  9.   Disposition:  Patient's PCP is [facility]. He will require OPC followup. He will not need outpatient services. He has no transportation limitations.  Expected length of stay > 2 nights.    Length of Stay: 1 days   Signed by:  Dorthula Rue. Earlene Plater, MD PGY-I, Internal Medicine Pager [phone]  10/04/2012, 11:36 AM

## 2012-10-04 NOTE — ED Provider Notes (Signed)
History     CSN: [id number]  Arrival date & time 10/03/12  1831   First MD Initiated Contact with Patient 10/03/12 1945      Chief Complaint  Patient presents with  . Shortness of Breath  . Weight Gain  level V caveat due to confusion and some uncooperativeness  (Consider location/radiation/quality/duration/timing/severity/associated sxs/prior treatment) Patient is a 67 y.o. male presenting with shortness of breath.  Shortness of Breath Associated symptoms: no chest pain    patient presents with shortness of breath and generalizedweakness. He's been to the ER 6 days ago and was followed at his primary care Dr. 3 days ago. He has hyponatremia and renal insufficiency and his medications been adjusted. Family member states she's been a little more confused also. Patient states he became more fluid on the his legs. He's had increasing difficulty breathing also.  Past Medical History  Diagnosis Date  . Sinus tachycardia 2006    a. Nonobstructive CAD by cath 2013. b. s/p atrial tachycardia ablation 08/2011 (sinus node modification)  . Orthostatic hypotension   . Unspecified essential hypertension   . Hyperlipidemia   . Cervical spondylosis with radiculopathy   . Syrinx     T5-T6  . Cord compression     Cord compression syndrome C5-C6, C6-C7  . ADD (attention deficit disorder)   . Decreased libido   . Depression   . Diabetes mellitus type 2, uncontrolled, with complications     CAD, orthostatic hypotension, erectile dysfunction  . Pulmonary nodule, left     Evaluated 04/2011 by pulmonology - felt to be benign granuloma  . Peripheral edema   . Coronary artery disease, non-occlusive     Mild nonobstructive CAD by cath 08/2011  . Systolic congestive heart failure     By echo 2013 at [facility] - reported EF 45% per pt  . Colitis     with surface exudate  . Hemorrhoids     small  . Gastritis   . Clostridium difficile infection 02/15/2012    causing pseudomembranosu colitis  . Diastolic  heart failure     grade 2 per echocardiogram (2011)  . Right bundle branch block and left posterior fascicular block   . Sleep apnea     occassionally uses cpap  . Myasthenia gravis     Status post thymectomy April 2012, hx of plasmapheresis    Past Surgical History  Procedure Laterality Date  . Thymectomy  april 2012  . Colonoscopy  11/13/2011    Procedure: COLONOSCOPY;  Surgeon: Theda Belfast, MD;  Location: [facility]COPY;  Service: Endoscopy;  Laterality: N/A;  . Cardiac catheterization    . Esophagogastroduodenoscopy  05/13/2012    Procedure: ESOPHAGOGASTRODUODENOSCOPY (EGD);  Surgeon: Iva Boop, MD;  Location: [facility];  Service: Endoscopy;  Laterality: N/A;  needs PAT w/ dx of Myastenia gravis and multiple meds. Pt requested lidocaine to numb IV site prior to start.  Very needle phobic.    Family History  Problem Relation Age of Onset  . Breast cancer Mother   . Other Father     Beck's Sarcoid  . Colon cancer Neg Hx   . Prostate cancer Neg Hx     History  Substance Use Topics  . Smoking status: Former Smoker -- 2.50 packs/day for 30 years    Quit date: 06/09/1991  . Smokeless tobacco: Never Used  . Alcohol Use: No      Review of Systems  Constitutional: Negative for activity change and appetite change.  HENT:  Negative for neck stiffness.   Eyes: Negative for pain.  Respiratory: Positive for shortness of breath. Negative for chest tightness.   Cardiovascular: Negative for chest pain and leg swelling.  Genitourinary: Negative for flank pain.  Neurological: Positive for weakness.  Psychiatric/Behavioral: Positive for confusion.    Allergies  Aminoglycosides; Beta adrenergic blockers; Calcium channel blockers; Metoprolol; Neuromuscular blocking agents; Other; Penicillins; and Quinine derivatives  Home Medications  No current outpatient prescriptions on file.  BP 141/77  Pulse 51  Temp(Src) 97.6 F (36.4 C) (Oral)  Resp 16  SpO2 98%  Physical Exam   Constitutional: He is oriented to person, place, and time. He appears well-developed and well-nourished.  HENT:  Head: Normocephalic and atraumatic.  Neck: Normal range of motion.  Cardiovascular: Normal rate and regular rhythm.   Pulmonary/Chest:  Mildly harsh breath sounds  Abdominal: Soft.  Musculoskeletal: He exhibits edema.  Pitting edema in bilateral lower extremities.  Neurological: He is alert and oriented to person, place, and time.  Awake but possible mild confusion. Somewhat uncooperative  Skin: Skin is warm.    ED Course  Procedures (including critical care time)  Labs Reviewed  CBC - Abnormal; Notable for the following:    RBC 3.77 (*)    Hemoglobin 10.5 (*)    HCT 28.1 (*)    MCV 74.5 (*)    MCHC 37.4 (*)    All other components within normal limits  BASIC METABOLIC PANEL - Abnormal; Notable for the following:    Sodium 122 (*)    Chloride 79 (*)    Glucose, Bld 285 (*)    BUN 88 (*)    Creatinine, Ser 1.59 (*)    GFR calc non Af Amer 43 (*)    GFR calc Af Amer 50 (*)    All other components within normal limits  PRO B NATRIURETIC PEPTIDE - Abnormal; Notable for the following:    Pro B Natriuretic peptide (BNP) 3056.0 (*)    All other components within normal limits  URINALYSIS, ROUTINE W REFLEX MICROSCOPIC - Abnormal; Notable for the following:    Hgb urine dipstick SMALL (*)    Protein, ur 100 (*)    All other components within normal limits  URINE MICROSCOPIC-ADD ON - Abnormal; Notable for the following:    Casts GRANULAR CAST (*)    All other components within normal limits  POCT I-STAT TROPONIN I - Abnormal; Notable for the following:    Troponin i, poc 0.10 (*)    All other components within normal limits  POCT I-STAT, CHEM 8 - Abnormal; Notable for the following:    Sodium 121 (*)    Chloride 81 (*)    BUN 96 (*)    Creatinine, Ser 1.80 (*)    Glucose, Bld 267 (*)    Calcium, Ion 1.11 (*)    Hemoglobin 10.5 (*)    HCT 31.0 (*)    All  other components within normal limits   Dg Chest 2 View  10/03/2012  *RADIOLOGY REPORT*  Clinical Data: Short of breath.  Weight gain.  Fluid retention.  CHEST - 2 VIEW  Comparison: 09/16/2012.  Findings: Median sternotomy wires are present.  Cardiopericardial silhouette is within normal limits for projection.  Small right pleural effusion is present.  Interstitial pulmonary edema is present with Charyl Dancer B lines at the bases. No pneumothorax.  IMPRESSION: Mild CHF.   Original Report Authenticated By: Andreas Newport, M.D.      No diagnosis found.   Date:  10/04/2012  Rate: 89  Rhythm: atrial fibrillation  QRS Axis: normal  Intervals: normal  ST/T Wave abnormalities: nonspecific ST/T changes  Conduction Disutrbances:none  Narrative Interpretation:   Old EKG Reviewed: unchanged    MDM  Patient will elect right abnormalities and some fluid overload. Patient has been seen and attempted management as an outpatient. He'll be admitted to his primary care Dr.        Juliet Rude. Rubin Payor, MD 10/04/12 0010

## 2012-10-04 NOTE — Evaluation (Signed)
Physical Therapy Evaluation Patient Details Name: [name] MRN: [id number] DOB: 1945/08/28 Today's Date: 10/04/2012 Time: 4098-1191 PT Time Calculation (min): 20 min  PT Assessment / Plan / Recommendation Clinical Impression  Patient is a 67 y/o male admitted with CHF with h/o myasthenia gravis and cord compression syndrome C5-6, C6-7.  He presents at functional baseline and although would benefit from home modifications to ease ADL's and increase tolerance to activities, seems resistant to any recommendations at this time including any follow up therapies.  Will sign off at this time as no further skilled PT needs identified.    PT Assessment  Patent does not need any further PT services    Follow Up Recommendations  No PT follow up          Equipment Recommendations  None recommended by PT    Recommendations for Other Services     Frequency      Precautions / Restrictions Precautions Precautions: Fall Precaution Comments: h/o about 3 falls/year with h/o myasthenia gravis; no injuries reported and able to get up pulling up on chair   Pertinent Vitals/Pain No pain complaints      Mobility  Bed Mobility Bed Mobility: Not assessed Details for Bed Mobility Assistance: pt standing throughout visit, refused to sit Transfers Transfers: Not assessed Details for Transfer Assistance: pt standing throughout visit, refused to sit Ambulation/Gait Ambulation/Gait Assistance: 7: Independent Ambulation Distance (Feet): 20 Feet Assistive device: None Ambulation/Gait Assistance Details: wide based with decreased hip/knee flexion, increased lateral sway for clearance Gait Pattern: Step-through pattern;Decreased stride length;Wide base of support;Decreased hip/knee flexion - right;Decreased hip/knee flexion - left    Exercises Other Exercises Other Exercises: Discussed home safety and equipment for assisting with energy conservation       Visit Information  Last PT Received  On: 10/04/12 Assistance Needed: +1    Subjective Data  Subjective: I can breathe better standing up Patient Stated Goal: None stated   Prior Functioning  Home Living Lives With: Family Type of Home: House Home Access: Stairs to enter Secretary/administrator of Steps: 3 Entrance Stairs-Rails: Right (post) Home Layout: One level Bathroom Shower/Tub: Engineer, manufacturing systems: Standard Home Adaptive Equipment: None Additional Comments: Reports using a stool or some cinder blocks to help with getting into truck (F150); Lives with mother 30 y/o who is mostly self sufficient Prior Function Level of Independence: Independent Able to Take Stairs?: Yes Driving: Yes Comments: worked as a Development worker, international aid: No difficulties    Copywriter, advertising Arousal/Alertness: Awake/alert Behavior During Therapy: WFL for tasks assessed/performed Overall Cognitive Status: Within Functional Limits for tasks assessed    Extremity/Trunk Assessment Right Lower Extremity Assessment RLE ROM/Strength/Tone: Deficits RLE ROM/Strength/Tone Deficits: pt did not allow for testing, noted weak hip flexors with short distance ambulation in the room and pt report difficulty getting in truck Left Lower Extremity Assessment LLE ROM/Strength/Tone: Deficits LLE ROM/Strength/Tone Deficits: pt did not allow for testing, noted weak hip flexors with short distance ambulation in the room and pt report difficulty getting in truck   Balance Balance Balance Assessed: Yes Static Standing Balance Static Standing - Balance Support: No upper extremity supported Static Standing - Level of Assistance: 7: Independent Static Standing - Comment/# of Minutes: standing 20 minutes during interview  End of Session PT - End of Session Activity Tolerance: Patient tolerated treatment well Patient left: Other (comment) (as found; standing in room beside sink)  GP     Jacarie Pate,CYNDI 10/04/2012, 3:41 PM Sheran Lawless, PT [phone]  10/04/2012   

## 2012-10-04 NOTE — Progress Notes (Signed)
Utilization Review Completed.   Dee Paden, RN, BSN Nurse Case Manager  [phone]  

## 2012-10-04 NOTE — H&P (Signed)
INTERNAL MEDICINE TEACHING SERVICE Attending Admission Note  Date: 10/04/2012  Patient name: [name]  Medical record number: [id number]  Date of birth: Jan 16, 1946    I have seen and evaluated [name] and discussed their care with the Residency Team.  66 yr. Old WM with pmhx significant for HTN, Chronic diastolic heart failure, uncontrolled Type 2 IDDM due to non-adherence to therapy, non-obstructive CAD, MG, presented due to worsening LE edema, lightheadedness, and decreased urine output.  He states he has also had increased SOB at rest and orthopnea.  He believes he does not get a good enough response from torsemide.  He was recently seen in clinic and his metolazone was stopped due to concerns of hyponatremia.  It must be noted that his dry weight it about ~168 lbs, and he has only significantly diuresed with aggressive diuresis and inotropic agent support.   He is noted to have an admission weight of 193 lbs, compared to a discharge weight of 188 lbs. His Na is noted to be 120, along with a Cl of 79, but noted improvement in Cr to 1.41. His discharge Cr was 1.71. Today he feels slightly better, denies CP or SOB. He is noted to be sitting up in bed. His CXR has findings consistent with interstitial pulmonary edema.  Physical Exam: Blood pressure 171/83, pulse 78, temperature 98.1 F (36.7 C), temperature source Oral, resp. rate 20, height 5\' 7"  (1.702 m), weight 191 lb 14.4 oz (87.045 kg), SpO2 98.00%.  General: Vital signs reviewed and noted. Well-developed, well-nourished, in no acute distress; alert, appropriate and cooperative throughout examination.  Head: Normocephalic, atraumatic.  Eyes: PERRL, EOMI, No signs of anemia or jaundince.  Nose: Mucous membranes moist, not inflammed, nonerythematous.  Throat: Oropharynx nonerythematous, no exudate appreciated.   Neck: No deformities, masses, or tenderness noted.Supple, No carotid Bruits, no JVD.  Lungs:  Normal respiratory effort.  Clear to auscultation BL without crackles or wheezes.  Heart: RRR. S1 and S2 normal without gallop, murmur, or rubs.  Abdomen:  BS normoactive. Soft, Nondistended, non-tender.  No masses or organomegaly.  Extremities: 2+ bilat LE pitting edema.  Neurologic: A&O X3, CN II - XII are grossly intact. Motor strength is 5/5 in the all 4 extremities,   Skin: No visible rashes, scars.    Lab results: Results for orders placed during the hospital encounter of 10/03/12 (from the past 24 hour(s))  CBC     Status: Abnormal   Collection Time    10/03/12  6:49 PM      Result Value Range   WBC 8.4  4.0 - 10.5 K/uL   RBC 3.77 (*) 4.22 - 5.81 MIL/uL   Hemoglobin 10.5 (*) 13.0 - 17.0 g/dL   HCT 40.9 (*) 81.1 - 91.4 %   MCV 74.5 (*) 78.0 - 100.0 fL   MCH 27.9  26.0 - 34.0 pg   MCHC 37.4 (*) 30.0 - 36.0 g/dL   RDW 78.2  95.6 - 21.3 %   Platelets 317  150 - 400 K/uL  BASIC METABOLIC PANEL     Status: Abnormal   Collection Time    10/03/12  6:49 PM      Result Value Range   Sodium 122 (*) 135 - 145 mEq/L   Potassium 3.7  3.5 - 5.1 mEq/L   Chloride 79 (*) 96 - 112 mEq/L   CO2 29  19 - 32 mEq/L   Glucose, Bld 285 (*) 70 - 99 mg/dL   BUN 88 (*)  6 - 23 mg/dL   Creatinine, Ser 0.86 (*) 0.50 - 1.35 mg/dL   Calcium 9.8  8.4 - 57.8 mg/dL   GFR calc non Af Amer 43 (*) >90 mL/min   GFR calc Af Amer 50 (*) >90 mL/min  PRO B NATRIURETIC PEPTIDE     Status: Abnormal   Collection Time    10/03/12  6:49 PM      Result Value Range   Pro B Natriuretic peptide (BNP) 3056.0 (*) 0 - 125 pg/mL  POCT I-STAT, CHEM 8     Status: Abnormal   Collection Time    10/03/12  7:14 PM      Result Value Range   Sodium 121 (*) 135 - 145 mEq/L   Potassium 3.7  3.5 - 5.1 mEq/L   Chloride 81 (*) 96 - 112 mEq/L   BUN 96 (*) 6 - 23 mg/dL   Creatinine, Ser 4.69 (*) 0.50 - 1.35 mg/dL   Glucose, Bld 629 (*) 70 - 99 mg/dL   Calcium, Ion 5.28 (*) 1.13 - 1.30 mmol/L   TCO2 29  0 - 100 mmol/L   Hemoglobin 10.5 (*) 13.0 - 17.0 g/dL    HCT 41.3 (*) 24.4 - 52.0 %  POCT I-STAT TROPONIN I     Status: Abnormal   Collection Time    10/03/12  7:15 PM      Result Value Range   Troponin i, poc 0.10 (*) 0.00 - 0.08 ng/mL   Comment NOTIFIED PHYSICIAN     Comment 3           URINALYSIS, ROUTINE W REFLEX MICROSCOPIC     Status: Abnormal   Collection Time    10/03/12  8:10 PM      Result Value Range   Color, Urine YELLOW  YELLOW   APPearance CLEAR  CLEAR   Specific Gravity, Urine 1.011  1.005 - 1.030   pH 5.0  5.0 - 8.0   Glucose, UA NEGATIVE  NEGATIVE mg/dL   Hgb urine dipstick SMALL (*) NEGATIVE   Bilirubin Urine NEGATIVE  NEGATIVE   Ketones, ur NEGATIVE  NEGATIVE mg/dL   Protein, ur 010 (*) NEGATIVE mg/dL   Urobilinogen, UA 0.2  0.0 - 1.0 mg/dL   Nitrite NEGATIVE  NEGATIVE   Leukocytes, UA NEGATIVE  NEGATIVE  URINE MICROSCOPIC-ADD ON     Status: Abnormal   Collection Time    10/03/12  8:10 PM      Result Value Range   Squamous Epithelial / LPF RARE  RARE   RBC / HPF 0-2  <3 RBC/hpf   Bacteria, UA RARE  RARE   Casts GRANULAR CAST (*) NEGATIVE  TROPONIN I     Status: None   Collection Time    10/04/12 12:04 AM      Result Value Range   Troponin I <0.30  <0.30 ng/mL  MAGNESIUM     Status: None   Collection Time    10/04/12 12:04 AM      Result Value Range   Magnesium 1.9  1.5 - 2.5 mg/dL  PHOSPHORUS     Status: None   Collection Time    10/04/12 12:04 AM      Result Value Range   Phosphorus 3.8  2.3 - 4.6 mg/dL  TROPONIN I     Status: None   Collection Time    10/04/12  6:00 AM      Result Value Range   Troponin I <0.30  <0.30 ng/mL  BASIC  METABOLIC PANEL     Status: Abnormal   Collection Time    10/04/12  6:00 AM      Result Value Range   Sodium 120 (*) 135 - 145 mEq/L   Potassium 3.9  3.5 - 5.1 mEq/L   Chloride 79 (*) 96 - 112 mEq/L   CO2 30  19 - 32 mEq/L   Glucose, Bld 260 (*) 70 - 99 mg/dL   BUN 87 (*) 6 - 23 mg/dL   Creatinine, Ser 1.61 (*) 0.50 - 1.35 mg/dL   Calcium 9.6  8.4 - 09.6 mg/dL    GFR calc non Af Amer 50 (*) >90 mL/min   GFR calc Af Amer 58 (*) >90 mL/min  GLUCOSE, CAPILLARY     Status: Abnormal   Collection Time    10/04/12  6:38 AM      Result Value Range   Glucose-Capillary 268 (*) 70 - 99 mg/dL    Imaging results:  Dg Chest 2 View  10/03/2012  *RADIOLOGY REPORT*  Clinical Data: Short of breath.  Weight gain.  Fluid retention.  CHEST - 2 VIEW  Comparison: 09/16/2012.  Findings: Median sternotomy wires are present.  Cardiopericardial silhouette is within normal limits for projection.  Small right pleural effusion is present.  Interstitial pulmonary edema is present with Charyl Dancer B lines at the bases. No pneumothorax.  IMPRESSION: Mild CHF.   Original Report Authenticated By: Andreas Newport, M.D.      Assessment and Plan: I agree with the formulated Assessment and Plan with the following changes: 66 yr. Old WM with pmhx significant for HTN, Chronic diastolic heart failure, uncontrolled Type 2 IDDM due to non-adherence to therapy, non-obstructive CAD, MG, presented due to worsening LE edema, lightheadedness, and decreased urine output, with acute on chronic diastolic heart failure. -I hope he is taking his medications at home as Rx. Given his non-adherence with his DM this concerns me.  Unfortunately, diuretic resistance in this case is possible. He will need aggressive diuresis with either a Lasix gtt at 10-15 mg/hr to start, or Lasix 120 mg IV q12hrs titrated to response. He will need milrinone therapy as well. Consult Cardiology since they are well aware of this difficult case. His hyponatremia is asymptomatic and unfortunately will just need to be monitored, this is a bad prognostic sign in HF.  If he becomes more hyponatremia, below 120 or symptomatic, use of a vaptan will need to be considered.   The treatment plan was discussed in detail with the patient.  Alternatives to treatment, side effects, risks and benefits, and complications were discussed with the  patient. Informed consent was obtained. The patient agrees to proceed with the current treatment plan.  Jonah Blue, DO 4/29/201410:33 AM

## 2012-10-05 DIAGNOSIS — Z9119 Patient's noncompliance with other medical treatment and regimen: Secondary | ICD-10-CM

## 2012-10-05 DIAGNOSIS — G7000 Myasthenia gravis without (acute) exacerbation: Secondary | ICD-10-CM

## 2012-10-05 DIAGNOSIS — E1129 Type 2 diabetes mellitus with other diabetic kidney complication: Secondary | ICD-10-CM

## 2012-10-05 DIAGNOSIS — N058 Unspecified nephritic syndrome with other morphologic changes: Secondary | ICD-10-CM

## 2012-10-05 DIAGNOSIS — E871 Hypo-osmolality and hyponatremia: Secondary | ICD-10-CM

## 2012-10-05 LAB — BASIC METABOLIC PANEL
Calcium: 9.2 mg/dL (ref 8.4–10.5)
Creatinine, Ser: 1.55 mg/dL — ABNORMAL HIGH (ref 0.50–1.35)
GFR calc non Af Amer: 44 mL/min — ABNORMAL LOW (ref >90)
Sodium: 122 mEq/L — ABNORMAL LOW (ref 135–145)

## 2012-10-05 LAB — GLUCOSE, CAPILLARY
Glucose-Capillary: 327 mg/dL — ABNORMAL HIGH (ref 70–99)
Glucose-Capillary: 346 mg/dL — ABNORMAL HIGH (ref 70–99)

## 2012-10-05 MED ORDER — GLUCOSE-VITAMIN C 4-6 GM-MG PO CHEW
CHEWABLE_TABLET | ORAL | Status: AC
  Filled 2012-10-05: qty 1

## 2012-10-05 NOTE — Progress Notes (Signed)
Subjective:    Interval Events:  No events overnight.  Pt sleeping comfortably when I entered room this AM.  Patient without dyspnea and is lying flat without orthopnea.    Objective:    Vital Signs:   Filed Vitals:   10/04/12 0942 10/04/12 1352 10/04/12 1653 10/05/12 0833  BP: 171/83 133/57 135/72   Pulse: 78 75 74   Temp:  97.7 F (36.5 C) 98.1 F (36.7 C)   TempSrc:  Oral Oral   Resp: 20 20 18    Height:      Weight:    191 lb 14.4 oz (87.045 kg)  SpO2: 98% 100% 100%     Weights: [employer]   10/04/12 0005 10/04/12 0636 10/05/12 0833  Weight: 193 lb 5.5 oz (87.7 kg) 191 lb 14.4 oz (87.045 kg) 191 lb 14.4 oz (87.045 kg)    Intake/Output:   Gross per 24 hour  Intake 2222.24 ml  Output   2400 ml  Net -177.76 ml     Net since admission:  -78 mL  Last BM Date: 10/03/12   Physical Exam: GENERAL: alert and oriented; resting comfortably in bed and in no distress LUNGS: bibasilar rales, normal work of breathing HEART: normal rate; regular rhythm ABDOMEN: soft, non-tender, normal bowel sounds, no masses palpated EXTREMITIES: 2+ edema SKIN: normal turgor    Labs: Basic Metabolic Panel: Lab 09/30/12 0941 10/03/12 1849 10/03/12 1914 10/04/12 0004 10/04/12 0600 10/05/12 0550  NA 123* 122* 121*  --  120* 122*  K 3.9 3.7 3.7  --  3.9 4.0  CL 81* 79* 81*  --  79* 82*  CO2 33* 29  --   --  30 28  GLUCOSE 298* 285* 267*  --  260* 403*  BUN 83* 88* 96*  --  87* 83*  CREATININE 1.71* 1.59* 1.80*  --  1.41* 1.55*  CALCIUM 9.6 9.8  --   --  9.6 9.2  MG  --   --   --  1.9  --   --   PHOS  --   --   --  3.8  --   --     Cardiac Enzymes: Lab 10/04/12 0004 10/04/12 0600 10/04/12 1136  TROPONINI <0.30 <0.30 <0.30    CBG: Lab 10/04/12 0638 10/04/12 1632  GLUCAP 268* 392*      Medications:    Infusions: . furosemide (LASIX) infusion 20 mg/hr (10/05/12 0115)  . milrinone 0.25 mcg/kg/min (10/05/12 0046)     Scheduled Medications: . aspirin EC   81 mg Oral Daily  . benazepril  20 mg Oral Daily  . heparin  5,000 Units Subcutaneous Q8H  . insulin aspart  0-5 Units Subcutaneous QHS  . insulin aspart  0-9 Units Subcutaneous TID WC  . metolazone  5 mg Oral BID  . mirtazapine  30 mg Oral QHS  . mycophenolate  1,000 mg Oral BID  . pantoprazole  40 mg Oral Daily  . predniSONE  15 mg Oral Daily  . pyridostigmine  60 mg Oral 5 X Daily  . sodium chloride  3 mL Intravenous Q12H  . sodium chloride  3 mL Intravenous Q12H  . zolpidem  5 mg Oral QHS     PRN Medications: sodium chloride, antiseptic oral rinse, oxyCODONE, pyridostigmine, sodium chloride    Assessment/ Plan:    1.   Acute exacerbation of chronic diastolic CHF:  An echocardiogram from 2011 demonstrated normal systolic function with grade 2 diastolic dysfunction. The echocardiogram performed earlier  this month demonstrated vigorous systolic function with an EF of 65-70% but grade 3 diastolic dysfunction.  This is complicated by near nephrotic range proteinuria secondary to diabetic nephropathy.  Recently, he was on torsemide 80 mg BID but his metolazone was recently stopped.  We will diurese with IV infusions of milrinone and furosemide.  We are adding back oral metolazone.  Target weight is 180 to 185 lbs. - Increase IV furosemide to 30 mg/hr continuous infusion - Continue PO metolazone 5 mg BID - Continue milrinone at 0.25 mcg/kg/hr (6.5 mL/hr) - Strict I&Os - Daily weights - Daily BMET  2.   Diabetic nephropathy with proteinuria:  Urine protein to creatinine ratio was measured during a recent hospitalization:  it was 2.56. This is complicating his fluid status and management of CHF. We will diurese as noted above.  3.   Hyponatremia:  This is been a problem for some time.   Sodium is currently 122.  Diurese as above.  4.   Stage III CKD:  Back in November and December of 2013 his creatinine was normal. It has been steadily in the 1.5-1.7 range this year. Given the findings  from the echocardiogram and the elevated urine protein-to-creatinine ratio, I think progression of diabetic nephropathy is the primary cause; he is not yet in nephrotic range proteinuria but is spilling significant protein into his urine. Creatinine currently 1.55 up from 1.41 after starting ACEi.  5.   Type II diabetes mellitus, uncontrolled, with complications:  Patient has persistently refused to allow [place] to treat his diabetes. Despite trying to educate him over and over again, he does not want his sugars to fall below 300.  His last hemoglobin A1c was 11.9 in February. - Continue sensitive scale correctional insulin aspart  6.   Hypertension:  In addition to diuretics, he takes benazepril 20 mg daily. Over the past year his blood pressure has averaged about 150/85. His blood pressures have been running in a similar range here in the hospital.  We will continue the benazepril here. - Continue benazepril 20 mg daily   7.   Myasthenia gravis: Managed at [facility] for this. Stable on his home regimen, which we have continued here.  - Continue mycophenolate 1000 mg twice a day  - Continue prednisone 15 mg daily  - Continue pyridostigmine 60mg  5 times daily  8.   Prophylaxis:  Heparin 5,000 units [place] TID  9.   Disposition:  Patient's PCP is [facility]. He will require OPC followup. He will not need outpatient services. He has no transportation limitations.  Expected length of stay > 2 nights.    Length of Stay: 2 days   Signed by:  Dorthula Rue. Earlene Plater, MD PGY-I, Internal Medicine Pager [phone]  10/05/2012, 10:31 AM

## 2012-10-05 NOTE — Progress Notes (Signed)
Pts CBG 327 pt refused to be covered with insulin, pt educated on the effects of elevated glucose levels.

## 2012-10-05 NOTE — Addendum Note (Signed)
Addended by: Dorie Rank E on: 10/05/2012 12:45 PM   Modules accepted: Orders

## 2012-10-05 NOTE — Progress Notes (Signed)
Brief Nutrition note:   RD was on unit when noticed this pt had been re-admitted. This RD has spoken with pt about his diet several times on previous admissions. Pt states that he is following his low sodium diet closely, but also admits to using Worcestershire sauce on food multiple times per day, but that he was "calculating it in" to the amount of total sodium he has been eating. Pt also reports he is following a 2 L fluid restriction. Unsure how truthful pt is. As he told me he is always takes his medications as prescribed, but again at this admission he refuses medications. Though at the time of my visit he was asking for some insulin because he had just eaten a brownie.   No additional nutrition interventions are warranted at this time. Please consult if nutrition can be of assistance.    Clarene Duke RD, LDN Pager [phone] After Hours pager [phone]

## 2012-10-05 NOTE — Progress Notes (Signed)
Pt educated on fluid restriction, pt verbalized understanding.

## 2012-10-05 NOTE — Progress Notes (Signed)
Inpatient Diabetes Program Recommendations  AACE/ADA: New Consensus Statement on Inpatient Glycemic Control (2013)  Target Ranges:  Prepandial:   less than 140 mg/dL      Peak postprandial:   less than 180 mg/dL (1-2 hours)      Critically ill patients:  140 - 180 mg/dL  Results for [name], [name] (MRN [id number]) as of 10/05/2012 09:36  Ref. Range 09/23/2012 21:05 09/24/2012 06:24 09/27/2012 14:24 10/04/2012 06:38 10/04/2012 16:32  Glucose-Capillary Latest Range: 70-99 mg/dL 409 (H) 811 (H) 914 (H) 268 (H) 392 (H)    Inpatient Diabetes Program Recommendations Insulin - Basal: consider adding low dose basal Lantus 10 units  Thank you  Piedad Climes BSN, RN,CDE Inpatient Diabetes Coordinator [phone] (team pager)

## 2012-10-05 NOTE — Progress Notes (Addendum)
INTERNAL MEDICINE TEACHING SERVICE Attending Note  Date: 10/05/2012  Patient name: [name]  Medical record number: [id number]  Date of birth: 1946-04-10    This patient has been seen and discussed with the house staff. Please see their note for complete details. I concur with their findings with the following additions/corrections: Difficult case. The patient continues to not allow [place] to treat his diabetes. I was very clear about risks of not following our recommendations, including death, stroke, cardiovascular events, and progression to HD.  Even after this discussion, and him acknowledging this, he refused to be treated with insulin. I question whether he is being adherent to his home medications and actually following our dietary recommendations. He was asking for a non-carb modified meal even when I clearly told him his BG is out of control. I/O's are being monitored, though I hope they are accurate. If so, he is actually net positive since admission. He must be fluid restricted.  He is drinking as he wishes and not allowing [place] to help him in this manner. We had this discussion with him, he understood. Consult cardiology. Agree with aggressive diuresis, Lasix 30 mg/hr IV and support with milrinone. Watch tele carefully. Watch electrolytes twice a daily while diuresing him.  Daily weights. 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 Hyponatremia is due to hypervolemia. If he drops below 120 or becomes symptomatic, we will need to consider adding a vaptan to his regimen.  The treatment plan was discussed in detail with the patient.  Alternatives to treatment, side effects, risks and benefits, and complications were discussed with the patient. Informed consent was obtained. The patient agrees to proceed with the current treatment plan.  Jonah Blue, DO  10/05/2012, 11:27 AM

## 2012-10-06 DIAGNOSIS — I5033 Acute on chronic diastolic (congestive) heart failure: Principal | ICD-10-CM

## 2012-10-06 LAB — BASIC METABOLIC PANEL
BUN: 79 mg/dL — ABNORMAL HIGH (ref 6–23)
Chloride: 80 mEq/L — ABNORMAL LOW (ref 96–112)
GFR calc Af Amer: 55 mL/min — ABNORMAL LOW (ref >90)
Glucose, Bld: 368 mg/dL — ABNORMAL HIGH (ref 70–99)
Potassium: 3.3 mEq/L — ABNORMAL LOW (ref 3.5–5.1)
Sodium: 121 mEq/L — ABNORMAL LOW (ref 135–145)

## 2012-10-06 LAB — GLUCOSE, CAPILLARY
Glucose-Capillary: 259 mg/dL — ABNORMAL HIGH (ref 70–99)
Glucose-Capillary: 465 mg/dL — ABNORMAL HIGH (ref 70–99)

## 2012-10-06 MED ORDER — TOLVAPTAN 15 MG PO TABS
15.0000 mg | ORAL_TABLET | Freq: Once | ORAL | Status: AC
  Administered 2012-10-06: 15 mg via ORAL
  Filled 2012-10-06: qty 1

## 2012-10-06 MED ORDER — POTASSIUM CHLORIDE CRYS ER 20 MEQ PO TBCR
40.0000 meq | EXTENDED_RELEASE_TABLET | Freq: Once | ORAL | Status: AC
  Administered 2012-10-06: 40 meq via ORAL
  Filled 2012-10-06: qty 2

## 2012-10-06 MED ORDER — PANTOPRAZOLE SODIUM 40 MG PO TBEC: 40.0000 mg | DELAYED_RELEASE_TABLET | Freq: Two times a day (BID) | ORAL | Status: DC | PRN

## 2012-10-06 NOTE — Progress Notes (Signed)
Internal Medicine Teaching Service Attending Note Date: 10/06/2012  Patient name: [name]  Medical record number: [id number]  Date of birth: [birth year]    This patient has been seen and discussed with the house staff. Please see their note for complete details. I concur with their findings with the following additions/corrections:   No overnight events, patient denies any shortness of breath or dyspnea. Patient is net negative about 2.8 L now.   Patient has advanced heart failure, chronic kidney disease from diabetic nephropathy with contributing cardiorenal from multiple admissions for decompensated heart failure in last several months.   Patient continues to not follow medical advice and discharge instructions regarding diet and medication compliance. Primary [facility] at [facility]. Dietary discretions continues while he is inpatient with patient eating brownie yesterday despite being on carb modified diet(he refuses to be on carb modified diet or insulin).  Patient understands his risks as noted by my colleague Dr. Kem Kays yesterday but chooses to be noncompliant. This is a difficult situation as this is his third admission this month in addition to which patient has had 7 ED visits and more than 20 outpatient visits in last 6 months. I have serious doubt in patient's ability to manage his chronic medical condition and it seems that his situation in heading to be worse with the ways things are going for last 6 months. I have not seen any family members with him during this visit. Patient surely needs assistance, redirection and care on a daily basis to keep him out of the hospital. I will consider a phychiatric evaluation while he is inpatient to assess for capacity and presence of co-existing depression. [facility] to manage care may be helpful if patient is deemed competent.   Dr Gala Romney has agreed to see the patient once in outpatient but recommends follow up at [facility] thereafter. We will make sure  that patient is seen at [facility] within 5 days of discharge to have a proper plan of care and followup for his chronic diastolic congestive heart failure. Patient's creatinine is at baseline.   His hyponatremia is also stable at 122 but given the plan to add metolazone and torsemide at discharge, will add tolvaptan today to have free water diuresis.  Rest of the medical management as noted in Dr Alford Highland note.   Lars Mage 10/06/2012, 10:22 AM

## 2012-10-06 NOTE — Progress Notes (Signed)
Subjective: Patient denies chest pain, denies sob.  He wants to maintain his glucose levels 200s-300s. He does not want a carb mod diet but wants Na restriction.  Patient wants Protonix bid prn.  Advised patient he is in Atrial fibrillation which is noted on telemetry but he denies having atrial fibrillation and states 2 previous cardiologist told him he is not in AF.   Objective: Vital signs in last 24 hours: Filed Vitals:   10/05/12 0833 10/05/12 1331 10/06/12 0824 10/06/12 0839  BP:  114/55  167/79  Pulse:  86  80  Temp:  98 F (36.7 C)  97.6 F (36.4 C)  TempSrc:  Oral  Oral  Resp:  20  20  Height:      Weight: 191 lb 14.4 oz (87.045 kg)  189 lb 9.6 oz (86.002 kg)   SpO2:  99%  97%   Weight change:   Intake/Output Summary (Last 24 hours) at 10/06/12 1259 Last data filed at 10/06/12 1059  Gross per 24 hour  Intake    990 ml  Output   4650 ml  Net  -3660 ml   Vitals reviewed. General: standing up, NAD, alert and oriented x 3 HEENT: [place]/at, nl scleral icterus  Cardiac: RRR, no rubs, murmurs or gallops Pulm: clear to auscultation bilaterally, no wheezes, rales, or rhonchi Abd: soft, nontender, nondistended, BS present, obese  Ext: warm and well perfused, 1-2+ pedal edema b/l lower extremity (edema improved since admission) Neuro: alert and oriented X3, neurologically intact, moving all 4 extremities   Lab Results: Basic Metabolic Panel:  Recent Labs Lab 10/04/12 0004  10/05/12 0550 10/06/12 0530  NA  --   < > 122* 121*  K  --   < > 4.0 3.3*  CL  --   < > 82* 80*  CO2  --   < > 28 31  GLUCOSE  --   < > 403* 368*  BUN  --   < > 83* 79*  CREATININE  --   < > 1.55* 1.46*  CALCIUM  --   < > 9.2 9.6  MG 1.9  --   --   --   PHOS 3.8  --   --   --   < > = values in this interval not displayed.  CBC:  Recent Labs Lab 09/30/12 0947 10/03/12 1849 10/03/12 1914  WBC 5.8 8.4  --   HGB 10.3* 10.5* 10.5*  HCT 28.1* 28.1* 31.0*  MCV 76.4* 74.5*  --   PLT 311 317  --     Cardiac Enzymes:  Recent Labs Lab 10/04/12 0004 10/04/12 0600 10/04/12 1136  TROPONINI <0.30 <0.30 <0.30   BNP:  Recent Labs Lab 10/03/12 1849  PROBNP 3056.0*   CBG:  Recent Labs Lab 10/05/12 1121 10/05/12 1416 10/05/12 1538 10/05/12 1620 10/06/12 0636 10/06/12 1124  GLUCAP 327* 359* 330* 346* 349* 259*   Urinalysis:  Recent Labs Lab 10/03/12 2010  COLORURINE YELLOW  LABSPEC 1.011  PHURINE 5.0  GLUCOSEU NEGATIVE  HGBUR SMALL*  BILIRUBINUR NEGATIVE  KETONESUR NEGATIVE  PROTEINUR 100*  UROBILINOGEN 0.2  NITRITE NEGATIVE  LEUKOCYTESUR NEGATIVE   Misc. Labs: none  Micro Results: No results found for this or any previous visit (from the past 240 hour(s)). Studies/Results: No results found. Medications: Scheduled Meds: . aspirin EC  81 mg Oral Daily  . benazepril  20 mg Oral Daily  . heparin  5,000 Units Subcutaneous Q8H  . insulin aspart  0-5 Units Subcutaneous  QHS  . insulin aspart  0-9 Units Subcutaneous TID WC  . metolazone  5 mg Oral BID  . mirtazapine  30 mg Oral QHS  . mycophenolate  1,000 mg Oral BID  . predniSONE  15 mg Oral Daily  . pyridostigmine  60 mg Oral 5 X Daily  . sodium chloride  3 mL Intravenous Q12H  . sodium chloride  3 mL Intravenous Q12H  . zolpidem  5 mg Oral QHS   Continuous Infusions: . furosemide (LASIX) infusion 30 mg/hr (10/06/12 0806)  . milrinone 0.25 mcg/kg/min (10/05/12 0046)   PRN Meds:.sodium chloride, antiseptic oral rinse, oxyCODONE, pantoprazole, pyridostigmine, sodium chloride Assessment/Plan: 67 y.o with multiple admissions for acute on chronic diastolic CHF.   1. Acute exacerbation of chronic diastolic CHF -The echocardiogram performed earlier this month demonstrated vigorous systolic function with an EF of 65-70% but grade 3 diastolic dysfunction. This is complicated by near nephrotic range proteinuria secondary to diabetic nephropathy. Recently, he was on torsemide 80 mg BID but his metolazone 5 mg  bid was recently stopped. We will diurese with IV infusions of milrinone and furosemide and added back oral metolazone.  -Target weight is 180 to 185 lbs by discharge this admission -Increase IV furosemide to 30 mg/hr continuous infusion  -Continue PO metolazone 5 mg BID  -Continue milrinone at 0.25 mcg/kg/hr (6.5 mL/hr)  -Strict I&Os, Daily weights  -Daily BMET  -spoke with Dr. Jones Broom he will follow patient once in clinic outpatient recommends consider Tolvaptan today (he will check with pharmacy on dosing); He recommends Torsemide 80 mg bid and Metalazone 5 mg qd by discharge.  He also recommends PICC line.  -Patients cardiologist is Dr. Freeman Caldron in [place] [phone] will have him f/u 10/13/12 9:15 am  -will continue Lasix and Milronone gtt for now.   2. 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 This is complicating his fluid status and management of CHF. We will diurese as noted above #1  -Monitor cbg. SSI, Novolog with meals but patient refusing treatment and likes to maintain cbg 200s-300s -patient requests not to be on carb mod diet   3. Hyponatremia -This is been a problem for some time. Sodium is currently 121 which indicates poor prognosis -Diurese as above.  -Restrict free water  -Dr. Jones Broom considering Tolvaptan  4. CKD:  -Creatinine stable. Trend BMET -Monitor BMET. Creatinine 1.46 today  5. Concern for paroxysmal AF -AF noted on telemetry.  -Advised patient he is in Atrial fibrillation which is noted on telemetry but he denies having atrial fibrillation and states 2 previous cardiologist told him he is not in AF. -Advised patient he is at increased risk of stroke CHADSVASC score 4-5.  He really should be anticoagulated to decrease risk of stroke if he really is in AF -performed EKG with shows patients rate is controlled but may have  intermittent AF or atrial arrhythmia   6. Hypertension, controlled I-114/55.  Continue benazepril 20 mg daily -monitor VS   7. Myasthenia gravis -Managed at [facility] for this. Stable on his home regimen, which we have continued here.  - Continue mycophenolate 1000 mg twice a day  - Continue prednisone 15 mg daily  - Continue pyridostigmine 60mg  5 times daily   8. Prophylaxis -Heparin 5,000 units [place] TID   9. Disposition: Patient's PCP is [facility]. He has an appointment 10/07/12. He will require OPC followup. He will  not need outpatient services. He has no transportation limitations. Expected length of stay > 2 nights.   Dispo: Disposition is deferred at this time, awaiting improvement of current medical problems.  Anticipated discharge in approximately 1-2 day(s).   The patient does have a current PCP (PRIBULA,CHRISTOPHER, MD), therefore will be requiring OPC follow-up after discharge.   The patient does not have transportation limitations that hinder transportation to clinic appointments.  .Services Needed at time of discharge: Y = Yes, Blank = No PT:   OT:   RN:   Equipment:   Other:     LOS: 3 days   Annett Gula [phone] 10/06/2012, 12:59 PM

## 2012-10-06 NOTE — Progress Notes (Signed)
Half a sandwich

## 2012-10-06 NOTE — Progress Notes (Signed)
Patient refused am Benzepril and Prednisone, patient has requested many changes to medications and refusing insulin.  Patient frequently request drinks,coffee and sandwiches.  Lorretta Harp RN

## 2012-10-07 ENCOUNTER — Encounter: Payer: Medicare Other | Admitting: Internal Medicine

## 2012-10-07 LAB — GLUCOSE, CAPILLARY

## 2012-10-07 LAB — BASIC METABOLIC PANEL
BUN: 75 mg/dL — ABNORMAL HIGH (ref 6–23)
Calcium: 9.4 mg/dL (ref 8.4–10.5)
GFR calc Af Amer: 58 mL/min — ABNORMAL LOW (ref >90)
GFR calc non Af Amer: 50 mL/min — ABNORMAL LOW (ref >90)
Potassium: 3.6 mEq/L (ref 3.5–5.1)
Sodium: 121 mEq/L — ABNORMAL LOW (ref 135–145)

## 2012-10-07 MED ORDER — INSULIN GLARGINE 100 UNIT/ML ~~LOC~~ SOLN: 20.0000 [IU] | Freq: Every day | SUBCUTANEOUS | Status: DC

## 2012-10-07 MED ORDER — TOLVAPTAN 15 MG PO TABS: 15.0000 mg | ORAL_TABLET | ORAL | Status: DC

## 2012-10-07 MED ORDER — BENAZEPRIL HCL 20 MG PO TABS: 20.0000 mg | ORAL_TABLET | Freq: Every day | ORAL | Status: AC

## 2012-10-07 MED ORDER — ASPIRIN 81 MG PO TBEC: 81.0000 mg | DELAYED_RELEASE_TABLET | Freq: Every day | ORAL | Status: AC

## 2012-10-07 MED ORDER — TORSEMIDE 20 MG PO TABS: 80.0000 mg | ORAL_TABLET | Freq: Two times a day (BID) | ORAL | Status: DC

## 2012-10-07 MED ORDER — METOLAZONE 5 MG PO TABS: 5.0000 mg | ORAL_TABLET | Freq: Every day | ORAL | Status: DC

## 2012-10-07 MED ORDER — MIRTAZAPINE 30 MG PO TABS: 30.0000 mg | ORAL_TABLET | Freq: Every day | ORAL | Status: AC

## 2012-10-07 NOTE — Progress Notes (Signed)
Patient's IV and telemetry has been discontinued, patient did not want to review discharge instructions. Patient is being discharged home. Lorretta Harp RN

## 2012-10-07 NOTE — Progress Notes (Signed)
10/07/12 1120 This NCM spoke with pt. about having home health services, and pt. stated "I do not want home health.  I said I would let [facility] call me next week, and then I would decide if I wanted home health then."  Therefore, pt. has refused home health at this time.   Tera Mater, RN, BSN NCM [phone]

## 2012-10-07 NOTE — Discharge Summary (Signed)
Internal Medicine Teaching [facility] Discharge Note  Name: [name] MRN: [id number] DOB: Jan 03, 1946 67 y.o.  Date of Admission: 10/03/2012  7:40 PM Date of Discharge: 10/07/2012 Attending Physician: Dr. Eben Burow   Discharge Diagnosis: 1.  Acute exacerbation of diastolic CHF (congestive heart failure) 2. Diabetes mellitus type II, uncontrolled associated with nephropathy and proteinuria (HA1C 11.9%)  3. Hyponatremia related to congestive heart failrue 4.  CKD (chronic kidney disease) 5. HYPERTENSION 6. Myasthenia gravis    Discharge Medications:   Medication List    STOP taking these medications       naproxen sodium 220 MG tablet  Commonly known as:  ANAPROX      TAKE these medications       aspirin 81 MG EC tablet  Take 1 tablet (81 mg total) by mouth daily.     benazepril 20 MG tablet  Commonly known as:  LOTENSIN  Take 1 tablet (20 mg total) by mouth daily.     insulin glargine 100 UNIT/ML injection  Commonly known as:  LANTUS  Inject 0.2 mLs (20 Units total) into the skin at bedtime.     MESTINON 60 MG tablet  Generic drug:  pyridostigmine  Take 60 mg by mouth 5 (five) times daily. Medication starts to wear off closer to 2.5 hours     metolazone 5 MG tablet  Commonly known as:  ZAROXOLYN  Take 1 tablet (5 mg total) by mouth daily.     mirtazapine 30 MG tablet  Commonly known as:  REMERON  Take 1 tablet (30 mg total) by mouth at bedtime.     mycophenolate 500 MG tablet  Commonly known as:  CELLCEPT  Take 1,000 mg by mouth 2 (two) times daily.     oxyCODONE 5 MG immediate release tablet  Commonly known as:  Oxy IR/ROXICODONE  Take 1 tablet (5 mg total) by mouth every 4 (four) hours as needed. For pain  Start taking on:  11/11/2012     pantoprazole 40 MG tablet  Commonly known as:  PROTONIX  Take 1 tablet (40 mg total) by mouth daily.     predniSONE 5 MG tablet  Commonly known as:  DELTASONE  Take 15 mg by mouth daily.     tolvaptan 15 MG Tabs   Commonly known as:  SAMSCA  Take 1 tablet (15 mg total) by mouth daily.     torsemide 20 MG tablet  Commonly known as:  DEMADEX  Take 4 tablets (80 mg total) by mouth 2 (two) times daily.     zolpidem 10 MG tablet  Commonly known as:  AMBIEN  Take 5 mg by mouth at bedtime.        Disposition and follow-up:   Mr.Mattie G Riggin was discharged from [facility] in stable condition.  At the hospital follow up visit please address  1) CMET at follow up  2) Address compliance with medications and if patient should be on meal time coverage with Novolog  3) If still hyponatremic. He was given 11 days of Tolvaptan.  Can continue for maximum of 30 days  Follow-up Appointments: Follow-up Information   Follow up with Denton Ar, MD On 10/12/2012. (10/12/12 at 8:45 am )    Contact information:   [address] [place] [address] [phone]      Discharge Orders   Future Appointments Provider Department Dept Phone   10/12/2012 8:45 AM Larey Seat, MD [facility]  [phone]   Future Orders Complete By Expires     Diet - low sodium heart healthy  As directed     Discharge instructions  As directed     Comments:      Please follow up with Dr. Collier [facility] [phone] on 10/12/12 at 8:45 am Please follow up with Dr. Elza Rafter on 10/13/12 at 9:15 am in [place] [phone] Please pick up your prescriptions from the [facility]. Please take Tolvaptan (Samsa) for 10 days and then have labs repeated within 5-7 days Please bring all medications and diabetic meter to appointment    Other Restrictions  As directed     Comments:      Fluid restrict to 1.5 L or less per day (all types of fluids) 2 grams of sodium restriction daily       Consultations: none   Procedures Performed:  Dg Chest 2 View  10/03/2012  *RADIOLOGY REPORT*  Clinical Data: Short of breath.  Weight gain.  Fluid retention.  CHEST - 2  VIEW  Comparison: 09/16/2012.  Findings: Median sternotomy wires are present.  Cardiopericardial silhouette is within normal limits for projection.  Small right pleural effusion is present.  Interstitial pulmonary edema is present with Charyl Dancer B lines at the bases. No pneumothorax.  IMPRESSION: Mild CHF.   Original Report Authenticated By: Andreas Newport, M.D.    Dg Chest 2 View  09/16/2012  *RADIOLOGY REPORT*  Clinical Data: Short of breath, lower extremity edema  CHEST - 2 VIEW  Comparison: Prior chest x-ray 09/05/2012  Findings: Stable to slightly enlarged right pleural effusion and associated right basilar opacity.  There is pulmonary vascular congestion without overt pulmonary edema.  Stable mild cardiomegaly with left atrial enlargement.  Trace atherosclerotic calcification in the thoracic aorta.  No acute osseous abnormality.  IMPRESSION:  1.  No acute cardiopulmonary disease. 2.  Stable to incrementally enlarged right pleural effusion and associated basilar atelectasis. 3.  Mild vascular congestion without overt edema. 4.  Left atrial enlargement   Original Report Authenticated By: Malachy Moan, M.D.     2D Echo:  09/06/12  Study Conclusions  - Left ventricle: The cavity size was normal. Wall thickness was increased in a pattern of mild LVH. There was mild focal basal hypertrophy of the septum, with an appearance suggesting concentric remodeling (increased wall thickness with normal wall mass). Systolic function was vigorous. The estimated ejection fraction was in the range of 65% to 70%. Although no diagnostic regional wall motion abnormality was identified, this possibility cannot be completely excluded on the basis of this study. Doppler parameters are consistent with a reversible restrictive pattern, indicative of decreased left ventricular diastolic compliance and/or increased left atrial pressure (grade 3 diastolic dysfunction). Doppler parameters are consistent with both  elevated ventricular end-diastolic filling pressure and elevated left atrial filling pressure. - Procedure narrative: Transthoracic echocardiography. Image quality was adequate, but doppler data was inadequate for assessment of either Aortic or Mitral stenosis. - Aortic valve: Cusp separation was reduced. Valve mobility was mildly restricted. - Mitral valve: Moderately calcified annulus. Mildly calcified leaflets . Mild focal calcification and tethering of the anterior leaflet, with involvement of chords. - Left atrium: The atrium was severely dilated. - Right ventricle: The cavity size was mildly dilated. Wall thickness was normal. - Right atrium: The atrium was mildly dilated. - Pulmonary arteries: PA peak pressure: 59mm Hg (S). Impressions:  - Occaisional ectopy was noted on this study, making this study technically difficult for  the assessment of cardiac function. The lack of adequate doppler data makes assessment of Aortic or Mitral stenosis impossible. The right ventricular systolic pressure was increased consistent with moderate to severe pulmonary hypertension   Cardiac Cath: None   Admission HPI:   Chief Complaint: Lightheadedness, worsening leg swelling, "too much water on me"  History of Present Illness:  67 year old male history of chronic diastolic heart failure (grade 3), history of peripheral edema, uncontrolled diabetes, HTN presents for worsening lower extremity edema (he has not elevated his feet and has not had his compression stockings for 2 days), decreased urine output with Torsemide alone and lightheadedness. Other associated symptoms orthopnea (sleeps on 3 pillows or on his desk), and increase in labored breathing. His dry weight is 188 lbs. He is not having adequate urine output with Torsemide alone. He cut down his fluid intake to 2L or less per day. His cardiologist is in [place].    Hospital Course by problem list: 1.  Acute exacerbation of  diastolic CHF (congestive heart failure) 2. Diabetes mellitus type II, uncontrolled associated with nephropathy and proteinuria (HA1C 11.9%)  3. Hyponatremia related to congestive heart failrue 4.  CKD (chronic kidney disease) 5. HYPERTENSION 6. Myasthenia gravis     67 y.o with multiple admissions for acute on chronic diastolic congestive heart failure.   1. Acute exacerbation of chronic diastolic CHF  Ejection fracion 65-70% but grade 3 diastolic dysfunction on 09/2012 echo.  Patient came in at 194 pounds at last discharge he was 188 pounds and he had 2 admissions for the month of April 2014.  He was discharged on Torsemide 80 mg bid and Metalazone 5 mg bid on the first discharge but in the outpatient clinic he was discontinued off of Metalazone because of hyponatremia but then seemed to have fluid retention as a result.  Pro BNP was 3056 this admission.  Initial troponin point of care was 0.10 (elevated).  Regular troponins negative x 3.  CXR with mild congestive heart failure.  His target weight was 180 to 185 pounds by discharge this admission but he was not able to achieve due to patient noncompliance with fluid intake.  By day of discharge he ws 189 pounds.  We initially started Lasix 120 bid intravenous.  We had IV furosemide initially 10-15 mg/hr then increased to 30 mg/hr continuous infusion, PO metolazone 5 mg BID with a milrinone drip.  We monitored strict intake and output and daily weights.  We trended his basic metabolic panel daily.  We spoke with Dr. Milas Kocher he added Tolvaptan 15 mg x 1.  He recommends Torsemide 80 mg twice a day and Metalazone 5 mg daily by discharge. He offered to see the patient once then Mr. Morissette is to follow with his primary cardiologist Dr. Freeman Caldron in [place] [phone] (f).  He has follow up appointment 10/13/12 9:15 am with Dr. Elza Rafter.  He also agree to [employer] outpatient services which aid in decreasing  multiple readmissions.   2. Diabetes associated with nephropathy and proteinuria (HA1C 11.9) and noncompliance Urine protein to creatinine ratio was measured during a recent hospitalization and was 2.56. This is complicating his fluid status and management of congestive heart failure. We monitored his glucose levels He has been hyperglycemic (300s-400s) and refusing insulin and Novolog and glucose checks.  We ordered sliding scale insulin, Novolog with meals, but patient refused treatment and likes to maintain cbg 200s-300s.  He also requests not to  be on carbohydrate modified diet.  We discussed at length the importance of diabetic control. Patient agrees to resume Lantus outpatient which he has not been taking for a while.  We resumed Lantus 20 units nightly.  It needs to be reassessed outpatient if he needs Novolog with meals three times a day.  Of note, he is noncompliant with diet.    3. Hyponatremia, chronic  He is asymptomatic.  This has been a problem for some time indicates poor prognosis with congestive heart failure.  We restricted free water, 1.5L fluid restriction daily.  He was given a dose of Tolvaptan 15 mg x 1 given 10/06/12. We will given him 10 days of Tolvaptan with repeat BMET outpatient.    4. Chronic Kidney Disease Creatinine stable this admission around 1.4. Baseline 1.4-1.5.  We will follow up a BMET outpatient.   5. Hypertension  Intermittently high this admission.  We continued Benazepril 20 mg daily and added Aspirin 81 mg daily.   6. Myasthenia gravis  He is managed at [facility].  He is stable on his home regimen which we have continued here.  Mycophenolate 1000 mg twice a day, Prednisone 15 mg daily, Pyridostigmine 60mg  5 times daily.   He was on Heparin 5,000 units [place] TID but intermittently refused doses.      Discharge Vitals:  BP 150/58  Pulse 96  Temp(Src) 97.1 F (36.2 C) (Oral)  Resp 20  Ht 5\' 7"  (1.702 m)  Wt 189 lb 14.4 oz (86.138 kg)   BMI 29.74 kg/m2  SpO2 97%  Discharge physical exam:  General: lying in bed, NAD, alert and oriented x 3  HEENT: [place]/at, nl scleral icterus  Cardiac: RRR, no rubs, murmurs or gallops  Pulm: clear to auscultation bilaterally, no wheezes, rales, or rhonchi  Abd: soft, nontender, nondistended, BS present, obese  Ext: warm and well perfused, 1-2+ pedal edema b/l lower extremity (edema improved since admission)  Neuro: alert and oriented X3, neurologically intact, moving all 4 extremities     Discharge Labs:  Results for ARAMIS, WEIL (MRN [id number]) as of 10/07/2012 11:26  Ref. Range 10/03/2012 18:49 10/03/2012 19:14 10/04/2012 00:04 10/04/2012 06:00 10/05/2012 05:50 10/06/2012 05:30 10/07/2012 06:15  Sodium Latest Range: 135-145 mEq/L 122 (L) 121 (L)  120 (L) 122 (L) 121 (L) 121 (L)  Potassium Latest Range: 3.5-5.1 mEq/L 3.7 3.7  3.9 4.0 3.3 (L) 3.6  Chloride Latest Range: 96-112 mEq/L 79 (L) 81 (L)  79 (L) 82 (L) 80 (L) 79 (L)  CO2 Latest Range: 19-32 mEq/L 29   30 28 31 31   BUN Latest Range: 6-23 mg/dL 88 (H) 96 (H)  87 (H) 83 (H) 79 (H) 75 (H)  Creatinine Latest Range: 0.50-1.35 mg/dL 4.09 (H) 8.11 (H)  9.14 (H) 1.55 (H) 1.46 (H) 1.41 (H)  Calcium Latest Range: 8.4-10.5 mg/dL 9.8   9.6 9.2 9.6 9.4  GFR calc non Af Amer Latest Range: >90 mL/min 43 (L)   50 (L) 44 (L) 48 (L) 50 (L)  GFR calc Af Amer Latest Range: >90 mL/min 50 (L)   58 (L) 51 (L) 55 (L) 58 (L)  Glucose Latest Range: 70-99 mg/dL 782 (H) 956 (H)  213 (H) 403 (H) 368 (H) 322 (H)  Calcium Ionized Latest Range: 1.13-1.30 mmol/L  1.11 (L)       Phosphorus Latest Range: 2.3-4.6 mg/dL   3.8      Magnesium Latest Range: 1.5-2.5 mg/dL   1.9  Results for STEPHON, WEATHERS (MRN [id number]) as of 10/07/2012 11:26  Ref. Range 10/03/2012 18:49 10/03/2012 19:15 10/04/2012 00:04 10/04/2012 06:00 10/04/2012 11:36  Troponin I Latest Range: <0.30 ng/mL   <0.30 <0.30 <0.30  Troponin i, poc Latest Range: 0.00-0.08 ng/mL  0.10 (HH)     Pro B Natriuretic  peptide (BNP) Latest Range: 0-125 pg/mL 3056.0 (H)       Results for AULTON, ROUTT (MRN [id number]) as of 10/07/2012 11:26  Ref. Range 09/30/2012 09:47 10/03/2012 18:49 10/03/2012 19:14  WBC Latest Range: 4.0-10.5 K/uL 5.8 8.4   RBC Latest Range: 4.22-5.81 MIL/uL 3.68 (L) 3.77 (L)   Hemoglobin Latest Range: 13.0-17.0 g/dL 44.0 (L) 34.7 (L) 42.5 (L)  HCT Latest Range: 39.0-52.0 % 28.1 (L) 28.1 (L) 31.0 (L)  MCV Latest Range: 78.0-100.0 fL 76.4 (L) 74.5 (L)   MCH Latest Range: 26.0-34.0 pg 28.0 27.9   MCHC Latest Range: 30.0-36.0 g/dL 95.6 (H) 38.7 (H)   RDW Latest Range: 11.5-15.5 % 12.4 12.3   Platelets Latest Range: 150-400 K/uL 311 317    Results for JAVOHN, BASEY (MRN [id number]) as of 10/07/2012 11:26  Ref. Range 10/03/2012 20:10  Color, Urine Latest Range: YELLOW  YELLOW  APPearance Latest Range: CLEAR  CLEAR  Specific Gravity, Urine Latest Range: 1.005-1.030  1.011  pH Latest Range: 5.0-8.0  5.0  Glucose Latest Range: NEGATIVE mg/dL NEGATIVE  Bilirubin Urine Latest Range: NEGATIVE  NEGATIVE  Ketones, ur Latest Range: NEGATIVE mg/dL NEGATIVE  Protein Latest Range: NEGATIVE mg/dL 884 (A)  Urobilinogen, UA Latest Range: 0.0-1.0 mg/dL 0.2  Nitrite Latest Range: NEGATIVE  NEGATIVE  Leukocytes, UA Latest Range: NEGATIVE  NEGATIVE  Hgb urine dipstick Latest Range: NEGATIVE  SMALL (A)  RBC / HPF Latest Range: <3 RBC/hpf 0-2  Squamous Epithelial / LPF Latest Range: RARE  RARE  Bacteria, UA Latest Range: RARE  RARE  Casts Latest Range: NEGATIVE  GRANULAR CAST (A)    Results for orders placed during the hospital encounter of 10/03/12 (from the past 24 hour(s))  GLUCOSE, CAPILLARY     Status: Abnormal   Collection Time    10/06/12  4:29 PM      Result Value Range   Glucose-Capillary 465 (*) 70 - 99 mg/dL   Comment 1 Notify RN    GLUCOSE, CAPILLARY     Status: Abnormal   Collection Time    10/06/12  8:51 PM      Result Value Range   Glucose-Capillary 402 (*) 70 - 99 mg/dL  BASIC  METABOLIC PANEL     Status: Abnormal   Collection Time    10/07/12  6:15 AM      Result Value Range   Sodium 121 (*) 135 - 145 mEq/L   Potassium 3.6  3.5 - 5.1 mEq/L   Chloride 79 (*) 96 - 112 mEq/L   CO2 31  19 - 32 mEq/L   Glucose, Bld 322 (*) 70 - 99 mg/dL   BUN 75 (*) 6 - 23 mg/dL   Creatinine, Ser 1.66 (*) 0.50 - 1.35 mg/dL   Calcium 9.4  8.4 - 06.3 mg/dL   GFR calc non Af Amer 50 (*) >90 mL/min   GFR calc Af Amer 58 (*) >90 mL/min  GLUCOSE, CAPILLARY     Status: Abnormal   Collection Time    10/07/12 10:42 AM      Result Value Range   Glucose-Capillary 372 (*) 70 - 99 mg/dL   Comment 1 Notify RN  Signed: Annett Gula 10/07/2012, 12:29 PM   Time Spent on Discharge: 60 minutes  Services Ordered on Discharge: none Equipment Ordered on Discharge: none

## 2012-10-07 NOTE — Progress Notes (Addendum)
Subjective: Pt denies complaints this am. Lying in bed asleep but arousable.   Patient refusing Heparin and fsbs checks overnight   Objective: Vital signs in last 24 hours: Filed Vitals:   10/06/12 0839 10/06/12 1442 10/06/12 2033 10/07/12 0641  BP: 167/79 148/84 148/76 152/73  Pulse: 80 97 90 81  Temp: 97.6 F (36.4 C) 98.2 F (36.8 C) 98.8 F (37.1 C) 98.7 F (37.1 C)  TempSrc: Oral Oral Oral Oral  Resp: 20 20 20 20   Height:      Weight:    189 lb 14.4 oz (86.138 kg)  SpO2: 97% 100% 100% 99%   Weight change: -2 lb 4.8 oz (-1.043 kg)  Intake/Output Summary (Last 24 hours) at 10/07/12 0827 Last data filed at 10/07/12 0743  Gross per 24 hour  Intake 4113.42 ml  Output   3125 ml  Net 988.42 ml   Vitals reviewed. General: lying in bed, NAD, alert and oriented x 3 HEENT: [place]/at, nl scleral icterus  Cardiac: RRR, no rubs, murmurs or gallops Pulm: clear to auscultation bilaterally, no wheezes, rales, or rhonchi Abd: soft, nontender, nondistended, BS present, obese  Ext: warm and well perfused, 1-2+ pedal edema b/l lower extremity (edema improved since admission) Neuro: alert and oriented X3, neurologically intact, moving all 4 extremities   Lab Results: Basic Metabolic Panel:  Recent Labs Lab 10/04/12 0004  10/06/12 0530 10/07/12 0615  NA  --   < > 121* 121*  K  --   < > 3.3* 3.6  CL  --   < > 80* 79*  CO2  --   < > 31 31  GLUCOSE  --   < > 368* 322*  BUN  --   < > 79* 75*  CREATININE  --   < > 1.46* 1.41*  CALCIUM  --   < > 9.6 9.4  MG 1.9  --   --   --   PHOS 3.8  --   --   --   < > = values in this interval not displayed.  CBC:  Recent Labs Lab 09/30/12 0947 10/03/12 1849 10/03/12 1914  WBC 5.8 8.4  --   HGB 10.3* 10.5* 10.5*  HCT 28.1* 28.1* 31.0*  MCV 76.4* 74.5*  --   PLT 311 317  --    Cardiac Enzymes:  Recent Labs Lab 10/04/12 0004 10/04/12 0600 10/04/12 1136  TROPONINI <0.30 <0.30 <0.30   BNP:  Recent Labs Lab 10/03/12 1849   PROBNP 3056.0*   CBG:  Recent Labs Lab 10/05/12 1538 10/05/12 1620 10/06/12 0636 10/06/12 1124 10/06/12 1629 10/06/12 2051  GLUCAP 330* 346* 349* 259* 465* 402*   Urinalysis:  Recent Labs Lab 10/03/12 2010  COLORURINE YELLOW  LABSPEC 1.011  PHURINE 5.0  GLUCOSEU NEGATIVE  HGBUR SMALL*  BILIRUBINUR NEGATIVE  KETONESUR NEGATIVE  PROTEINUR 100*  UROBILINOGEN 0.2  NITRITE NEGATIVE  LEUKOCYTESUR NEGATIVE   Misc. Labs: none  Micro Results: No results found for this or any previous visit (from the past 240 hour(s)). Studies/Results: No results found. Medications: Scheduled Meds: . aspirin EC  81 mg Oral Daily  . benazepril  20 mg Oral Daily  . heparin  5,000 Units Subcutaneous Q8H  . insulin aspart  0-5 Units Subcutaneous QHS  . insulin aspart  0-9 Units Subcutaneous TID WC  . metolazone  5 mg Oral BID  . mirtazapine  30 mg Oral QHS  . mycophenolate  1,000 mg Oral BID  . predniSONE  15 mg  Oral Daily  . pyridostigmine  60 mg Oral 5 X Daily  . sodium chloride  3 mL Intravenous Q12H  . sodium chloride  3 mL Intravenous Q12H  . zolpidem  5 mg Oral QHS   Continuous Infusions: . furosemide (LASIX) infusion 30 mg/hr (10/07/12 0322)  . milrinone 0.25 mcg/kg/min (10/07/12 0519)   PRN Meds:.sodium chloride, antiseptic oral rinse, oxyCODONE, pantoprazole, pyridostigmine, sodium chloride Assessment/Plan: 67 y.o with multiple admissions for acute on chronic diastolic CHF.   1. Acute exacerbation of chronic diastolic CHF -EF of 65-70% but grade 3 diastolic dysfunction.  -Target weight is 180 to 185 lbs by discharge this admission but not able to achieve due to patient noncompliance with fluid intake  -IV furosemide to 30 mg/hr continuous infusion, PO metolazone 5 mg BID, Continue milrinone gtt -Strict I&Os, Daily weights  -Daily BMET  -Dr. Jones Broom added Tolvaptan 15 mg x 1; He recommends Torsemide 80 mg bid and Metalazone 5 mg qd by discharge. He will see the  patient once then he is to follow with primary cardiologist -Patients cardiologist is Dr. Freeman Caldron in [place] [phone] will have him f/u 10/13/12 9:15 am  -patient agreed to [facility] services outpatient   2. 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 This is complicating his fluid status and management of CHF. We will diurese as noted above #1  -Monitor cbg. He has been hyperglycemic (300s-400s) and refusing insulin and fsbs checks -SSI, Novolog with meals but patient refusing treatment and likes to maintain cbg 200s-300s -patient requests not to be on carb mod diet  -Discussed at length importance of DM control. Patient agrees to resume Lantus which he has not been taking for a while  -will resume Lantus 20 units qhs   3. Hyponatremia -This is been a problem for some time indicates poor prognosis -Diurese as above.  -Restrict free water, 1.5L fluid restriction daily  -Tolvaptan 15 x 1 given 5/1.  Will given him 10 days of Tolvaptan with repeat BMET soon  -Trend BMET. Na stable today 121  4. CKD:  -Creatinine stable. Trend BMET  5. Hypertension -intermittently high.  -Continue benazepril 20 mg daily, Aspirin 81 mg qd  -monitor VS   6. Myasthenia gravis -Managed at [facility] Stable on his home regimen, which we have continued here.  - Continue mycophenolate 1000 mg twice a day  - Continue prednisone 15 mg daily  - Continue pyridostigmine 60mg  5 times daily   7. DVT Prophylaxis -Heparin 5,000 units [place] TID, Pt intermittently refusing doses   8. Disposition: d/c today with [facility] follow, [facility] f/u 10/12/12 at 8:45 am Cardiologist in [place] 10/13/12 at 9:15 am    Dispo: Disposition is deferred at this time, awaiting improvement of current medical problems.  Anticipated discharge in approximately 1-2 day(s).   The patient does have a current PCP  (PRIBULA,CHRISTOPHER, MD), therefore will be requiring OPC follow-up after discharge.   The patient does not have transportation limitations that hinder transportation to clinic appointments.  .Services Needed at time of discharge: Y = Yes, Blank = No PT:   OT:   RN:   Equipment:   Other:     LOS: 4 days   Annett Gula [phone] 10/07/2012, 8:27 AM

## 2012-10-07 NOTE — Progress Notes (Signed)
Pt refused to have his blood sugar checked. Refused heparin injection

## 2012-10-07 NOTE — Progress Notes (Signed)
Thank you to Dr Tonny Branch and his team for assistance with this referral.  Patient has accepted [facility] services.  Written consents have been obtained.  Patient is agreeable to having a telehealth scale per his HH of choice and [facility] CHF management support.  Ivonne Andrew RNCM notified of telehealth request.  Of note, [facility] Care Management services does not replace or interfere with any services that are arranged by inpatient case management or social work.  For additional questions or referrals please contact Anibal Henderson BSN RN [facility] Liaison at [phone].

## 2012-10-07 NOTE — Progress Notes (Signed)
Met pt in the hallway while he was taking a walk. Pt expressed improvement in his health. Chaplain assured pt of his support through prayer. Chaplain also shared words of hope and encouragement with pt, and provided ministry of presence and empathic listening. Pt thanked chaplain for his words of inspiration. Chaplain will follow up as needed. Kelle Darting [phone]  10/07/12 0930  Clinical Encounter Type  Visited With Patient  Visit Type Initial;Spiritual support  Referral From Patient  Spiritual Encounters  Spiritual Needs Emotional  Stress Factors  Patient Stress Factors None identified

## 2012-10-08 ENCOUNTER — Telehealth: Payer: Self-pay | Admitting: Internal Medicine

## 2012-10-08 NOTE — Telephone Encounter (Signed)
  INTERNAL MEDICINE RESIDENCY PROGRAM After-Hours Telephone Call    Reason for call:   I received a call from [name] at 9:45 AM indicating that he was confused about his heart failure diuretic regimen.  I clarified that he should continue Torsemide 80mg  (4 tab) BID and metolazone 5mg  (1 tab) daily (which he refers to as his "kicker" pill).  He also reports that the new medication that he was supposed to start (Tovaptan) was not available at 2 pharmacies, and that he would receive that medication on Monday.    Pertinent Data:   [name] was recently discharged on 10/07/12 after being admitted for an acute exacerbation of dCHF and hyponatremia.  His metolazone was changed to 5mg  daily (had been on BID in the past, then discontinued because thought to be contributing to hyponatremia.  [name] sodium had been stable during hospitalization (121-123 since 09/27/12).    Assessment / Plan / Recommendations:   Informed [name] of his diuretic regimen as above  Instructed that he may start tolvaptan on Monday after receiving medication  Reminded him of his appt with Dr. Collier Bullock on 10/12/12.  He also has an appt with his primary cardiologist at [facility] on 10/13/12.    As always, pt is advised that if symptoms worsen or new symptoms arise, they should go to an urgent care facility or to to ER for further evaluation.    Belia Heman, MD   10/08/2012, 10:05 AM

## 2012-10-11 ENCOUNTER — Encounter (HOSPITAL_COMMUNITY): Payer: Self-pay | Admitting: Emergency Medicine

## 2012-10-11 ENCOUNTER — Telehealth: Payer: Self-pay | Admitting: *Deleted

## 2012-10-11 ENCOUNTER — Emergency Department (HOSPITAL_COMMUNITY)
Admission: EM | Admit: 2012-10-11 | Discharge: 2012-10-11 | Disposition: A | Discharge status: Home / Self Care | Payer: Medicare Other | Attending: Emergency Medicine | Admitting: Emergency Medicine

## 2012-10-11 DIAGNOSIS — Z8639 Personal history of other endocrine, nutritional and metabolic disease: Secondary | ICD-10-CM | POA: Insufficient documentation

## 2012-10-11 DIAGNOSIS — Z7982 Long term (current) use of aspirin: Secondary | ICD-10-CM | POA: Insufficient documentation

## 2012-10-11 DIAGNOSIS — L600 Ingrowing nail: Secondary | ICD-10-CM

## 2012-10-11 DIAGNOSIS — F3289 Other specified depressive episodes: Secondary | ICD-10-CM | POA: Insufficient documentation

## 2012-10-11 DIAGNOSIS — IMO0002 Reserved for concepts with insufficient information to code with codable children: Secondary | ICD-10-CM | POA: Insufficient documentation

## 2012-10-11 DIAGNOSIS — L02619 Cutaneous abscess of unspecified foot: Secondary | ICD-10-CM | POA: Insufficient documentation

## 2012-10-11 DIAGNOSIS — Z87891 Personal history of nicotine dependence: Secondary | ICD-10-CM | POA: Insufficient documentation

## 2012-10-11 DIAGNOSIS — L03032 Cellulitis of left toe: Secondary | ICD-10-CM

## 2012-10-11 DIAGNOSIS — I10 Essential (primary) hypertension: Secondary | ICD-10-CM | POA: Insufficient documentation

## 2012-10-11 DIAGNOSIS — Z8709 Personal history of other diseases of the respiratory system: Secondary | ICD-10-CM | POA: Insufficient documentation

## 2012-10-11 DIAGNOSIS — Z8619 Personal history of other infectious and parasitic diseases: Secondary | ICD-10-CM | POA: Insufficient documentation

## 2012-10-11 DIAGNOSIS — Z9981 Dependence on supplemental oxygen: Secondary | ICD-10-CM | POA: Insufficient documentation

## 2012-10-11 DIAGNOSIS — F329 Major depressive disorder, single episode, unspecified: Secondary | ICD-10-CM | POA: Insufficient documentation

## 2012-10-11 DIAGNOSIS — G4730 Sleep apnea, unspecified: Secondary | ICD-10-CM | POA: Insufficient documentation

## 2012-10-11 DIAGNOSIS — Z9861 Coronary angioplasty status: Secondary | ICD-10-CM | POA: Insufficient documentation

## 2012-10-11 DIAGNOSIS — L03039 Cellulitis of unspecified toe: Secondary | ICD-10-CM | POA: Insufficient documentation

## 2012-10-11 DIAGNOSIS — Z79899 Other long term (current) drug therapy: Secondary | ICD-10-CM | POA: Insufficient documentation

## 2012-10-11 DIAGNOSIS — Z8659 Personal history of other mental and behavioral disorders: Secondary | ICD-10-CM | POA: Insufficient documentation

## 2012-10-11 DIAGNOSIS — I5030 Unspecified diastolic (congestive) heart failure: Secondary | ICD-10-CM | POA: Insufficient documentation

## 2012-10-11 DIAGNOSIS — I2510 Atherosclerotic heart disease of native coronary artery without angina pectoris: Secondary | ICD-10-CM | POA: Insufficient documentation

## 2012-10-11 DIAGNOSIS — I5022 Chronic systolic (congestive) heart failure: Secondary | ICD-10-CM | POA: Insufficient documentation

## 2012-10-11 DIAGNOSIS — Z8739 Personal history of other diseases of the musculoskeletal system and connective tissue: Secondary | ICD-10-CM | POA: Insufficient documentation

## 2012-10-11 DIAGNOSIS — Z8669 Personal history of other diseases of the nervous system and sense organs: Secondary | ICD-10-CM | POA: Insufficient documentation

## 2012-10-11 DIAGNOSIS — Z8719 Personal history of other diseases of the digestive system: Secondary | ICD-10-CM | POA: Insufficient documentation

## 2012-10-11 DIAGNOSIS — E119 Type 2 diabetes mellitus without complications: Secondary | ICD-10-CM | POA: Insufficient documentation

## 2012-10-11 DIAGNOSIS — Z862 Personal history of diseases of the blood and blood-forming organs and certain disorders involving the immune mechanism: Secondary | ICD-10-CM | POA: Insufficient documentation

## 2012-10-11 MED ORDER — SULFAMETHOXAZOLE-TRIMETHOPRIM 800-160 MG PO TABS: 1.0000 | ORAL_TABLET | Freq: Two times a day (BID) | ORAL | Status: DC

## 2012-10-11 NOTE — ED Provider Notes (Signed)
  Medical screening examination/treatment/procedure(s) were performed by non-physician practitioner and as supervising physician I was immediately available for consultation/collaboration.   December Hedtke, MD 10/11/12 2310 

## 2012-10-11 NOTE — ED Notes (Signed)
Pt stated that he has chronic toenail trouble. C/o curled toenail and wants it removed. Pt does not want bandage taken off at present

## 2012-10-11 NOTE — ED Provider Notes (Signed)
History    This chart was scribed for non-physician practitioner Dierdre Forth, PA-C working with Gerhard Munch, MD by Gerlean Ren, ED Scribe. This patient was seen in room WTR7/WTR7 and the patient's care was started at 5:37 PM.    CSN: [id number]  Arrival date & time 10/11/12  1628   First MD Initiated Contact with Patient 10/11/12 1657      Chief Complaint  Patient presents with  . Nail Problem    Pt stated that his 1st toe, l/foot needs to be removed     The history is provided by the patient. No language interpreter was used.  [name] is a 67 y.o. male with h/o MS who presents to the Emergency Department complaining of left foot swelling that he thinks is related to an infection under his left great toenail first noticed several days ago when pt noticed a small amount of clear fluid draining from underneath the toenail. He states the toenail is loose. Pt requests to have the nail removed.  Pt denies any associated pain or tenderness to the foot or toe. Patient denies fever, chills, nausea, vomiting.  He denies purulent drainage from the toe. He also denies known injury to the toe.  He has a history of myasthenia gravis and takes CellCept and Mestinon.  Pt has numerous allergies.     Past Medical History  Diagnosis Date  . Sinus tachycardia 2006    a. Nonobstructive CAD by cath 2013. b. s/p atrial tachycardia ablation 08/2011 (sinus node modification)  . Orthostatic hypotension   . Unspecified essential hypertension   . Hyperlipidemia   . Cervical spondylosis with radiculopathy   . Syrinx     T5-T6  . Cord compression     Cord compression syndrome C5-C6, C6-C7  . ADD (attention deficit disorder)   . Decreased libido   . Depression   . Diabetes mellitus type 2, uncontrolled, with complications     CAD, orthostatic hypotension, erectile dysfunction  . Pulmonary nodule, left     Evaluated 04/2011 by pulmonology - felt to be benign granuloma  . Peripheral edema   .  Coronary artery disease, non-occlusive     Mild nonobstructive CAD by cath 08/2011  . Systolic congestive heart failure     By echo 2013 at [facility] - reported EF 45% per pt  . Colitis     with surface exudate  . Hemorrhoids     small  . Gastritis   . Clostridium difficile infection 02/15/2012    causing pseudomembranosu colitis  . Diastolic heart failure     grade 2 per echocardiogram (2011)  . Right bundle branch block and left posterior fascicular block   . Sleep apnea     occassionally uses cpap  . Myasthenia gravis     Status post thymectomy April 2012, hx of plasmapheresis    Past Surgical History  Procedure Laterality Date  . Thymectomy  april 2012  . Colonoscopy  11/13/2011    Procedure: COLONOSCOPY;  Surgeon: Theda Belfast, MD;  Location: [facility] ENDOSCOPY;  Service: Endoscopy;  Laterality: N/A;  . Cardiac catheterization    . Esophagogastroduodenoscopy  05/13/2012    Procedure: ESOPHAGOGASTRODUODENOSCOPY (EGD);  Surgeon: Iva Boop, MD;  Location: [facility];  Service: Endoscopy;  Laterality: N/A;  needs PAT w/ dx of Myastenia gravis and multiple meds. Pt requested lidocaine to numb IV site prior to start.  Very needle phobic.    Family History  Problem Relation Age of Onset  .  Breast cancer Mother   . Other Father     Beck's Sarcoid  . Colon cancer Neg Hx   . Prostate cancer Neg Hx     History  Substance Use Topics  . Smoking status: Former Smoker -- 2.50 packs/day for 30 years    Quit date: 06/09/1991  . Smokeless tobacco: Never Used  . Alcohol Use: No      Review of Systems  Constitutional: Negative for fever, diaphoresis, appetite change, fatigue and unexpected weight change.  HENT: Negative for mouth sores and neck stiffness.   Eyes: Negative for visual disturbance.  Respiratory: Negative for cough, chest tightness, shortness of breath and wheezing.   Cardiovascular: Negative for chest pain.  Gastrointestinal: Negative for nausea, vomiting, abdominal  pain, diarrhea and constipation.  Endocrine: Negative for polydipsia, polyphagia and polyuria.  Genitourinary: Negative for dysuria, urgency, frequency and hematuria.  Musculoskeletal: Negative for back pain.       Positive left foot pain  Skin: Positive for color change. Negative for rash.  Allergic/Immunologic: Negative for immunocompromised state.  Neurological: Negative for syncope, light-headedness and headaches.  Hematological: Does not bruise/bleed easily.  Psychiatric/Behavioral: Negative for sleep disturbance. The patient is not nervous/anxious.     Allergies  Aminoglycosides; Beta adrenergic blockers; Calcium channel blockers; Metoprolol; Neuromuscular blocking agents; Other; Penicillins; and Quinine derivatives  Home Medications   Current Outpatient Rx  Name  Route  Sig  Dispense  Refill  . aspirin EC 81 MG EC tablet   Oral   Take 1 tablet (81 mg total) by mouth daily.   30 tablet   11   . benazepril (LOTENSIN) 20 MG tablet   Oral   Take 1 tablet (20 mg total) by mouth daily.   30 tablet   2   . metolazone (ZAROXOLYN) 5 MG tablet   Oral   Take 1 tablet (5 mg total) by mouth daily.   30 tablet   1   . mirtazapine (REMERON) 30 MG tablet   Oral   Take 1 tablet (30 mg total) by mouth at bedtime.   30 tablet   1   . mycophenolate (CELLCEPT) 500 MG tablet   Oral   Take 1,000 mg by mouth 2 (two) times daily.          Marland Kitchen oxyCODONE (OXY IR/ROXICODONE) 5 MG immediate release tablet   Oral   Take 1 tablet (5 mg total) by mouth every 4 (four) hours as needed. For pain   120 tablet   0   . pantoprazole (PROTONIX) 40 MG tablet   Oral   Take 1 tablet (40 mg total) by mouth daily.   30 tablet   6   . predniSONE (DELTASONE) 5 MG tablet   Oral   Take 15 mg by mouth daily.         Marland Kitchen pyridostigmine (MESTINON) 60 MG tablet   Oral   Take 60 mg by mouth 5 (five) times daily. Medication starts to wear off closer to 2.5 hours         . tolvaptan (SAMSCA) 15  MG TABS   Oral   Take 1 tablet (15 mg total) by mouth daily.   10 tablet   0     Generic ok   . torsemide (DEMADEX) 20 MG tablet   Oral   Take 4 tablets (80 mg total) by mouth 2 (two) times daily.   240 tablet   1   . zolpidem (AMBIEN) 10 MG tablet  Oral   Take 5 mg by mouth at bedtime.         . sulfamethoxazole-trimethoprim (SEPTRA DS) 800-160 MG per tablet   Oral   Take 1 tablet by mouth every 12 (twelve) hours.   20 tablet   0     BP 157/84  Pulse 91  Temp(Src) 98.2 F (36.8 C) (Oral)  Resp 20  Ht 5\' 3"  (1.6 m)  Wt 185 lb (83.915 kg)  BMI 32.78 kg/m2  SpO2 99%  Physical Exam  Nursing note and vitals reviewed. Constitutional: He appears well-developed and well-nourished. No distress.  HENT:  Head: Normocephalic and atraumatic.  Mouth/Throat: Oropharynx is clear and moist. No oropharyngeal exudate.  Eyes: Conjunctivae are normal. No scleral icterus.  Neck: Normal range of motion. Neck supple.  Cardiovascular: Normal rate, regular rhythm, normal heart sounds and intact distal pulses.   No murmur heard. Capillary refill less than 3 seconds  Pulmonary/Chest: Effort normal and breath sounds normal. No respiratory distress. He has no wheezes.  Musculoskeletal: Normal range of motion. He exhibits tenderness (left great toe). He exhibits no edema.  ROM: Full range of motion of left great toe, left ankle  Lymphadenopathy:    He has no cervical adenopathy.  Neurological: He is alert. Coordination normal.  Speech is clear and goal oriented Moves extremities without ataxia Sensation to left foot including great toe intact Strength 5 out of 5 in the left lower extremity  Skin: Skin is warm and dry. No rash noted. He is not diaphoretic. There is erythema (mild).  No tenting of the skin Mild erythema signs of cellulitis in the left great toe, no purulent drainage, no gross abscess amenable to incision and drainage; left great toenail loose with area of ingrown  toenail on the medial side  Psychiatric: He has a normal mood and affect.    ED Course  Excise ingrown toenail Date/Time: 10/11/2012 6:35 PM Performed by: Dierdre Forth Authorized by: Dierdre Forth Consent: Verbal consent obtained. Risks and benefits: risks, benefits and alternatives were discussed Consent given by: patient Patient understanding: patient states understanding of the procedure being performed Patient consent: the patient's understanding of the procedure matches consent given Procedure consent: procedure consent matches procedure scheduled Relevant documents: relevant documents present and verified Site marked: the operative site was marked Required items: required blood products, implants, devices, and special equipment available Patient identity confirmed: verbally with patient and arm band Time out: Immediately prior to procedure a "time out" was called to verify the correct patient, procedure, equipment, support staff and site/side marked as required. Preparation: Patient was prepped and draped in the usual sterile fashion. Local anesthesia used: yes Anesthesia: digital block Local anesthetic: lidocaine 1% without epinephrine Anesthetic total: 5 ml Patient sedated: no Patient tolerance: Patient tolerated the procedure well with no immediate complications.   (including critical care time) DIAGNOSTIC STUDIES: Oxygen Saturation is 99% on room air, normal by my interpretation.    COORDINATION OF CARE: 5:43 PM- Numbed pt's left great toe and informed him that I will return shortly to remove the nail.  Pt verbalizes understanding and agrees.    Labs Reviewed - No data to display No results found.   1. Ingrowing toenail of left foot   2. Cellulitis of great toe of left foot       MDM  Eugenia Mcalpine Lochridge resents with history of physical consistent with infected ingrown toenail. Patient toenail removed in entirety without complication. Will treat with  Bactrim for mild cellulitis.  Patient expresses significant concern about interaction of antibiotic and his current medications. I discussed this with the pharmacist who has run a medication interaction interrogation and deemed Septra and acceptable antibiotic to treat the patient's cellulitis. Patient has a followup with his primary care physician tomorrow and I requested that he have a physician recheck his toe to ensure improvement.  I have also discussed reasons to return immediately to the ER.  Patient expresses understanding and agrees with plan.  I personally performed the services described in this documentation, which was scribed in my presence. The recorded information has been reviewed and is accurate.   Dahlia Client Tameyah Koch, PA-C 10/11/12 1843

## 2012-10-11 NOTE — Telephone Encounter (Signed)
I agree with the advice given to [name] and the follow-up appointment to assess the toe in clinic tomorrow.

## 2012-10-11 NOTE — Telephone Encounter (Signed)
Pt called stating left great toenail is about to come off.  The nail is loose, attached at base with small amount clear liquid.   Toenail looks dead to pt.  Pt noted this today, nail just popped up.  He has covered with band aid. Pt has scheduled appointment in clinic tomorrow at 8:45 with Dr Collier Bullock.  Will it be okay for him to wait until tomorrow.  He was instructed not to pull the nail off, just keep it covered.

## 2012-10-12 ENCOUNTER — Telehealth: Payer: Self-pay | Admitting: *Deleted

## 2012-10-12 ENCOUNTER — Ambulatory Visit: Payer: Medicare Other | Admitting: Internal Medicine

## 2012-10-12 NOTE — Telephone Encounter (Signed)
Pt called with c/o dizziness, and being lightheaded.  Onset early today after taking Samsca. ( this is day 2 on this med )  He is also taking metolazone one tablet daily.  He is not clear on what meds to take.  Lost d/c instructions. Pt does have appointment tomorrow.  I talked with Dr Shirlee Latch and she will call pt and review meds and advise to keep appointment tomorrow or visit ED.

## 2012-10-12 NOTE — Telephone Encounter (Signed)
He cancelled his appointment today I believe? He should come to be seen in clinic as soon as possible or go to the ED. I agree with having previous discharging team call him to review his meds.

## 2012-10-12 NOTE — Telephone Encounter (Signed)
Spoke with patient educated him to take Metalazone 5 mg qd and Tolvaptan 15 mg x 10 days.  He will follow up in clinic 10/13/12. He had to reschedule his cardiologist appt in 11/2012. He is still inquiring about a follow up appointment with Dr. Jones Broom.  I told him Dr. Jones Broom will only see him once but he should follow up with his regular cardiologist Dr. Elza Rafter in [place] .  Gave the patient the number to call [phone].   Shirlee Latch MD [phone]

## 2012-10-13 ENCOUNTER — Encounter: Payer: Self-pay | Admitting: Licensed Clinical Social Worker

## 2012-10-13 ENCOUNTER — Encounter: Payer: Self-pay | Admitting: Internal Medicine

## 2012-10-13 ENCOUNTER — Ambulatory Visit (INDEPENDENT_AMBULATORY_CARE_PROVIDER_SITE_OTHER): Payer: Medicare Other | Admitting: Internal Medicine

## 2012-10-13 VITALS — BP 129/71 | HR 84 | Temp 98.1°F | Ht 67.0 in | Wt 188.7 lb

## 2012-10-13 DIAGNOSIS — Z79899 Other long term (current) drug therapy: Secondary | ICD-10-CM

## 2012-10-13 DIAGNOSIS — L03032 Cellulitis of left toe: Secondary | ICD-10-CM

## 2012-10-13 DIAGNOSIS — G7000 Myasthenia gravis without (acute) exacerbation: Secondary | ICD-10-CM

## 2012-10-13 DIAGNOSIS — L03039 Cellulitis of unspecified toe: Secondary | ICD-10-CM

## 2012-10-13 DIAGNOSIS — I5032 Chronic diastolic (congestive) heart failure: Secondary | ICD-10-CM

## 2012-10-13 LAB — LIPID PANEL
HDL: 30 mg/dL — ABNORMAL LOW (ref >39)
LDL Cholesterol: 100 mg/dL — ABNORMAL HIGH (ref 0–99)
VLDL: 52 mg/dL — ABNORMAL HIGH (ref 0–40)

## 2012-10-13 LAB — GAMMA GT: GGT: 53 U/L — ABNORMAL HIGH (ref 7–51)

## 2012-10-13 LAB — BASIC METABOLIC PANEL WITH GFR
BUN: 97 mg/dL — ABNORMAL HIGH (ref 6–23)
Calcium: 10 mg/dL (ref 8.4–10.5)
GFR, Est African American: 42 mL/min — ABNORMAL LOW
Glucose, Bld: 209 mg/dL — ABNORMAL HIGH (ref 70–99)

## 2012-10-13 LAB — HEPATIC FUNCTION PANEL
ALT: 8 U/L (ref 0–53)
Total Protein: 6.5 g/dL (ref 6.0–8.3)

## 2012-10-13 LAB — CBC
MCH: 27.8 pg (ref 26.0–34.0)
Platelets: 345 10*3/uL (ref 150–400)
RBC: 3.85 MIL/uL — ABNORMAL LOW (ref 4.22–5.81)
WBC: 8.4 10*3/uL (ref 4.0–10.5)

## 2012-10-13 NOTE — Progress Notes (Signed)
Subjective:   Patient ID: [name] male   DOB: 1945-10-15 67 y.o.   MRN: [id number]  HPI: [name] is a 67 y.o. male with past medical history significant as outlined below who presented to the clinic for a hospital followup. Patient was admitted for acute on chronic CHF and was discharged on 10/07/2012.  Patient furthermore was evaluated in the emergency room on 10/11/12 for ingrown nail with mild cellulitis and was prescribed Septra.   Patient was provided information about Home health nurse but patient refused to get any information. He noted that he does not want anyone in his house  Patient did not bring in his medication.   Past Medical History  Diagnosis Date  . Sinus tachycardia 2006    a. Nonobstructive CAD by cath 2013. b. s/p atrial tachycardia ablation 08/2011 (sinus node modification)  . Orthostatic hypotension   . Unspecified essential hypertension   . Hyperlipidemia   . Cervical spondylosis with radiculopathy   . Syrinx     T5-T6  . Cord compression     Cord compression syndrome C5-C6, C6-C7  . ADD (attention deficit disorder)   . Decreased libido   . Depression   . Diabetes mellitus type 2, uncontrolled, with complications     CAD, orthostatic hypotension, erectile dysfunction  . Pulmonary nodule, left     Evaluated 04/2011 by pulmonology - felt to be benign granuloma  . Peripheral edema   . Coronary artery disease, non-occlusive     Mild nonobstructive CAD by cath 08/2011  . Systolic congestive heart failure     By echo 2013 at [facility] - reported EF 45% per pt  . Colitis     with surface exudate  . Hemorrhoids     small  . Gastritis   . Clostridium difficile infection 02/15/2012    causing pseudomembranosu colitis  . Diastolic heart failure     grade 2 per echocardiogram (2011)  . Right bundle branch block and left posterior fascicular block   . Sleep apnea     occassionally uses cpap  . Myasthenia gravis     Status post thymectomy April 2012, hx of  plasmapheresis   Current Outpatient Prescriptions  Medication Sig Dispense Refill  . aspirin EC 81 MG EC tablet Take 1 tablet (81 mg total) by mouth daily.  30 tablet  11  . benazepril (LOTENSIN) 20 MG tablet Take 1 tablet (20 mg total) by mouth daily.  30 tablet  2  . metolazone (ZAROXOLYN) 5 MG tablet Take 1 tablet (5 mg total) by mouth daily.  30 tablet  1  . mirtazapine (REMERON) 30 MG tablet Take 1 tablet (30 mg total) by mouth at bedtime.  30 tablet  1  . mycophenolate (CELLCEPT) 500 MG tablet Take 1,000 mg by mouth 2 (two) times daily.       Melene Muller ON 11/11/2012] oxyCODONE (OXY IR/ROXICODONE) 5 MG immediate release tablet Take 1 tablet (5 mg total) by mouth every 4 (four) hours as needed. For pain  120 tablet  0  . pantoprazole (PROTONIX) 40 MG tablet Take 1 tablet (40 mg total) by mouth daily.  30 tablet  6  . predniSONE (DELTASONE) 5 MG tablet Take 15 mg by mouth daily.      Marland Kitchen pyridostigmine (MESTINON) 60 MG tablet Take 60 mg by mouth 5 (five) times daily. Medication starts to wear off closer to 2.5 hours      . sulfamethoxazole-trimethoprim (SEPTRA DS) 800-160 MG per tablet  Take 1 tablet by mouth every 12 (twelve) hours.  20 tablet  0  . tolvaptan (SAMSCA) 15 MG TABS Take 1 tablet (15 mg total) by mouth daily.  10 tablet  0  . torsemide (DEMADEX) 20 MG tablet Take 4 tablets (80 mg total) by mouth 2 (two) times daily.  240 tablet  1  . zolpidem (AMBIEN) 10 MG tablet Take 5 mg by mouth at bedtime.      . [DISCONTINUED] pantoprazole (PROTONIX) 40 MG tablet Take 1 tablet (40 mg total) by mouth daily.  30 tablet  2   No current facility-administered medications for this visit.   Family History  Problem Relation Age of Onset  . Breast cancer Mother   . Other Father     Beck's Sarcoid  . Colon cancer Neg Hx   . Prostate cancer Neg Hx    History   Social History  . Marital Status: Single    Spouse Name: N/A    Number of Children: 1  . Years of Education: N/A   Occupational  History  . Retired     Surveyor, minerals   Social History Main Topics  . Smoking status: Former Smoker -- 2.50 packs/day for 30 years    Quit date: 06/09/1991  . Smokeless tobacco: Never Used  . Alcohol Use: No  . Drug Use: No  . Sexually Active: None   Other Topics Concern  . None   Social History Narrative   Has a single man's lifestyle and will remind anyone of it when asked.   Prefers to see only male physicians   Retired Surveyor, minerals      Lives at home with mother (69 y.o)   Has 1 son   Review of Systems: Constitutional: Denies fever, chills, Respiratory: Denies SOB, DOE, cough, chest tightness,  and wheezing.   Cardiovascular: Denies chest pain, palpitations and leg swelling.  Gastrointestinal: Denies nausea, vomiting, abdominal pain, diarrhea, constipation, blood in stool and abdominal distention.  Musculoskeletal: pain of left big toe  Skin: Denies pallor, rash and wound.  Neurological: Denies dizziness,   Objective:  Physical Exam: Filed Vitals:   10/13/12 1408  BP: 129/71  Pulse: 84  Temp: 98.1 F (36.7 C)  TempSrc: Oral  Height: 5\' 7"  (1.702 m)  Weight: 188 lb 11.2 oz (85.594 kg)  SpO2: 97%   Constitutional: Vital signs reviewed.  Patient is a well-developed and well-nourished male in no acute distress and cooperative with exam. Alert and oriented x3. .  Neck: Supple,  Cardiovascular: RRR, S1 normal, S2 normal, no MRG, pulses symmetric and intact bilaterally Pulmonary/Chest: CTAB, no wheezes, rales, or rhonchi Abdominal: Soft. Non-tender, non-distended, bowel sounds are normal,  Neurological: A&O x3,  Skin: left big toe: nail removed, nail bed mildly erythematosus as well the surrounding skin area.tender to palpation. No surrounding puss present  EXT : 2+pitting edema bilaterally present, mildly tender to palpation , mild erythema present

## 2012-10-13 NOTE — Assessment & Plan Note (Signed)
Patient was prescribed Septra on 10/11/12 by emergency room physician. Patient did not refill his medication. I discussed with patient that he needs to take the medication otherwise he will have significant infection considering patient's history of diabetes. Patient is agreeable prescription is at the pharmacy

## 2012-10-13 NOTE — Assessment & Plan Note (Signed)
Questionable 3 lb weight increase from 5/6 but 1 lb decrease from 10/07/12 [facility]). I would not make any changes at this point. I will obtain a basic metabolic panel. Will continue torsemide 80 mg 2 times a day along with Zaroxolyn 5 mg daily and Benazapril 20 mg daily

## 2012-10-14 ENCOUNTER — Telehealth: Payer: Self-pay | Admitting: Internal Medicine

## 2012-10-14 ENCOUNTER — Encounter: Payer: Self-pay | Admitting: Internal Medicine

## 2012-10-14 NOTE — Progress Notes (Signed)
Lab results faxed to Dr. Clarisa Kindred, [facility], [phone], 10-14-2012 at 10:12a. Called Beth at [phone]  to notify that fax had been sent.  Alric Quan, PBT 10-14-2012 [phone]

## 2012-10-14 NOTE — Telephone Encounter (Signed)
Patient called at 8:08 PM, asking for his BMP results done in the day time. He does not have new complains except for baseline dizziness. I updated him with BMP results. He is happy with his Na level of 125.   Recent Labs Lab 10/13/12 1514  NA 125*  K 4.4  CL 82*  CO2 32  GLUCOSE 209*  BUN 97*  CREATININE 1.85*  CALCIUM 10.0   Lorretta Harp, MD PGY2, Internal Medicine Teaching Service Pager: [phone]

## 2012-10-14 NOTE — Progress Notes (Signed)
Case discussed with Dr. Illath immediately after the resident saw the patient. We reviewed the resident's history and exam and pertinent patient test results. I agree with the assessment, diagnosis and plan of care documented in the resident's note. 

## 2012-10-14 NOTE — Telephone Encounter (Signed)
This encounter was wrongly opened.   [name]

## 2012-10-17 ENCOUNTER — Telehealth: Payer: Self-pay | Admitting: *Deleted

## 2012-10-17 NOTE — Telephone Encounter (Signed)
At his next appointment his anxiety issues need to be address as well as his heart failure.  He has refused [facility] for telemetry monitoring of his blood pressure and weight.  This may be an opportunity to readdress this with him if he is so concerned about his condition.

## 2012-10-17 NOTE — Telephone Encounter (Signed)
Pt calls more than 3 times this am, leaving messages stating that his condition is critical and he needs to be seen asap. He states dr Loistine Chance called him and told him he needed to be in for labs today. He has appt in [facility] , dr bensihmon today. i spoke w/ dr Loistine Chance, she states the pt needs to keep appt scheduled for [facility] 5/22 and keep appt in [facility] tomorrow and that dr bensihmon will more than likely have orders for labs and possibly change medication doses or medicines. Pt is reassurred and appt is reviewed with him.

## 2012-10-18 ENCOUNTER — Telehealth (HOSPITAL_COMMUNITY): Payer: Self-pay | Admitting: *Deleted

## 2012-10-18 ENCOUNTER — Ambulatory Visit (HOSPITAL_COMMUNITY)
Admission: RE | Admit: 2012-10-18 | Discharge: 2012-10-18 | Disposition: A | Discharge status: Home / Self Care | Payer: Medicare Other | Source: Ambulatory Visit | Attending: Internal Medicine | Admitting: Internal Medicine

## 2012-10-18 VITALS — BP 112/52 | HR 84 | Wt 185.5 lb

## 2012-10-18 DIAGNOSIS — Z79899 Other long term (current) drug therapy: Secondary | ICD-10-CM | POA: Insufficient documentation

## 2012-10-18 DIAGNOSIS — I4510 Unspecified right bundle-branch block: Secondary | ICD-10-CM | POA: Insufficient documentation

## 2012-10-18 DIAGNOSIS — M47812 Spondylosis without myelopathy or radiculopathy, cervical region: Secondary | ICD-10-CM | POA: Insufficient documentation

## 2012-10-18 DIAGNOSIS — I5032 Chronic diastolic (congestive) heart failure: Secondary | ICD-10-CM | POA: Insufficient documentation

## 2012-10-18 DIAGNOSIS — Z9119 Patient's noncompliance with other medical treatment and regimen: Secondary | ICD-10-CM | POA: Insufficient documentation

## 2012-10-18 DIAGNOSIS — F329 Major depressive disorder, single episode, unspecified: Secondary | ICD-10-CM | POA: Insufficient documentation

## 2012-10-18 DIAGNOSIS — R41840 Attention and concentration deficit: Secondary | ICD-10-CM | POA: Insufficient documentation

## 2012-10-18 DIAGNOSIS — F3289 Other specified depressive episodes: Secondary | ICD-10-CM | POA: Insufficient documentation

## 2012-10-18 DIAGNOSIS — E119 Type 2 diabetes mellitus without complications: Secondary | ICD-10-CM | POA: Insufficient documentation

## 2012-10-18 DIAGNOSIS — I498 Other specified cardiac arrhythmias: Secondary | ICD-10-CM | POA: Insufficient documentation

## 2012-10-18 DIAGNOSIS — Z7982 Long term (current) use of aspirin: Secondary | ICD-10-CM | POA: Insufficient documentation

## 2012-10-18 DIAGNOSIS — I10 Essential (primary) hypertension: Secondary | ICD-10-CM | POA: Insufficient documentation

## 2012-10-18 DIAGNOSIS — Z91199 Patient's noncompliance with other medical treatment and regimen due to unspecified reason: Secondary | ICD-10-CM | POA: Insufficient documentation

## 2012-10-18 DIAGNOSIS — G7000 Myasthenia gravis without (acute) exacerbation: Secondary | ICD-10-CM | POA: Insufficient documentation

## 2012-10-18 DIAGNOSIS — E785 Hyperlipidemia, unspecified: Secondary | ICD-10-CM | POA: Insufficient documentation

## 2012-10-18 MED ORDER — METOLAZONE 5 MG PO TABS: 5.0000 mg | ORAL_TABLET | Freq: Every day | ORAL | Status: DC

## 2012-10-18 MED ORDER — METOLAZONE 5 MG PO TABS: ORAL_TABLET | ORAL | Status: DC

## 2012-10-18 NOTE — Telephone Encounter (Signed)
Pharmacy called to clairify orders for metolazone as it was sent in as, take 1 tab daily take 1 tab every Mon and Fri, gave correct directions of 1 tab every Mon and Fri

## 2012-10-18 NOTE — Patient Instructions (Addendum)
Follow up next week  Take Metolazone 5 mg every Monday and Friday. Also take 5 mg of Metolazone on the other days if your weight is 184 pounds or greater at home   Do the following things EVERYDAY: 1) Weigh yourself in the morning before breakfast. Write it down and keep it in a log. 2) Take your medicines as prescribed 3) Eat low salt foods-Limit salt (sodium) to 2000 mg per day.  4) Stay as active as you can everyday 5) Limit all fluids for the day to less than 2 liters

## 2012-10-18 NOTE — Assessment & Plan Note (Signed)
Volume status mildly elevated. Weight at home 181 pounds. Will continue Torsemide at 80 mg twice a day and add metolazone twice a week. He is only agreeable to taking Metolazone twice a week and not daily. Reinforced medication compliance, low salt food choices, and limiting fluid intake to < 2 liters per day. 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 I have instructed him to keep his follow appointment with Dr Okey Dupre November 13, 2012. Follow up next week to reassess volume and check BMET.

## 2012-10-18 NOTE — Progress Notes (Signed)
Patient ID: [name], male   DOB: [birth year], 67 y.o.   MRN: [id number]  Weight Range    Baseline proBNP     HPI: [name] is 67 year old male with a PMH of chronic diastolic heart failure, myasthenia gravis, DM, HTN, HL, RBBB and LAFB, and noncompliance. He has been followed closely by Dr. Freeman Caldron in [facility].  He has also had sinus node modification/ATach ablation 08/2011. Notes from [facility] recently indicate LV dysfunction with reported EF 45%. LHC at [facility] 3/13: pLAD 20%, dLAD 20%, mRCA 20%, PDA 20%. Nuc 12/12: no scar or ischemia, EF 42%. He is not on beta blockers due to MG.   ECHO 09/06/12 EF 65-70%  ECHO 09/06/12 EF 65-70%   He has a long h/o noncompliance with medical advice. On 09/03/12 he presented to [facility] ED with dyspnea and lower extremity edema. He signed out AMA despite hyponatremia.   Admitted to [facility] 09/05/12 with dyspnea and lower extremity edema. Diuresed with IV lasix. Discharge weight 187 pounds same as admit. In Feburuay was in the 160s but he says this was due to GI problems.   Admitted to [facility] 09/16/12 with volume overload due to dietary noncompliance.  He required Milrinone and lasix drip. Discharge weight 194 pounds. Discharged on Metolazone 5 mg Monday and Friday and Torsemide 80 mg twice a day. He was discharged 09/23/12   Admitted 10/03/12 with volume overload. He received Milrionone and IV lasix . Discharged on Torsemide 80 mg twice a day and Metolazone 5 mg daily. Discharge weight 189 pounds. He was instructed to follow up with his primary cardiologist Dr. Freeman Caldron in [place] [phone] ([phone] (f). He has follow up appointment 10/13/12 9:15 am with Dr. Elza Rafter. He also agree to [employer] outpatient services which aid in decreasing multiple readmissions.   He presents for follow up. Dyspnea with exertion. He continues to use an Engineer, petroleum in the grocery store. Denies PND/Orthopnea. Weight at home 181 pounds. He has been  taking 80 mg of Torsemide 80 mg twice a day and he has stopped taking Metolazone due to dizziness. He continues on Tolvaptan daily and will he will complete this on Thursday. He has started limiting his fluid intake and he following low salt diet. He has refused [employer] again. He did not follow up at [facility] on May 8 but he has rescheduled for June 16.      ROS: All systems negative except as listed in HPI, PMH and Problem List.  Past Medical History  Diagnosis Date  . Sinus tachycardia 2006    a. Nonobstructive CAD by cath 2013. b. s/p atrial tachycardia ablation 08/2011 (sinus node modification)  . Orthostatic hypotension   . Unspecified essential hypertension   . Hyperlipidemia   . Cervical spondylosis with radiculopathy   . Syrinx     T5-T6  . Cord compression     Cord compression syndrome C5-C6, C6-C7  . ADD (attention deficit disorder)   . Decreased libido   . Depression   . Diabetes mellitus type 2, uncontrolled, with complications     CAD, orthostatic hypotension, erectile dysfunction  . Pulmonary nodule, left     Evaluated 04/2011 by pulmonology - felt to be benign granuloma  . Peripheral edema   . Coronary artery disease, non-occlusive     Mild nonobstructive CAD by cath 08/2011  . Systolic congestive heart failure     By echo 2013 at  [facility] - reported EF 45% per pt  . Colitis     with surface exudate  . Hemorrhoids     small  . Gastritis   . Clostridium difficile infection 02/15/2012    causing pseudomembranosu colitis  . Diastolic heart failure     grade 2 per echocardiogram (2011)  . Right bundle branch block and left posterior fascicular block   . Sleep apnea     occassionally uses cpap  . 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 aspirin EC 81 MG EC tablet Take 1 tablet (81 mg total) by mouth daily.  30 tablet  11  . benazepril  (LOTENSIN) 20 MG tablet Take 1 tablet (20 mg total) by mouth daily.  30 tablet  2  . mirtazapine (REMERON) 30 MG tablet Take 1 tablet (30 mg total) by mouth at bedtime.  30 tablet  1  . mycophenolate (CELLCEPT) 500 MG tablet Take 1,000 mg by mouth 2 (two) times daily.       Melene Muller ON 11/11/2012] oxyCODONE (OXY IR/ROXICODONE) 5 MG immediate release tablet Take 1 tablet (5 mg total) by mouth every 4 (four) hours as needed. For pain  120 tablet  0  . pantoprazole (PROTONIX) 40 MG tablet Take 1 tablet (40 mg total) by mouth daily.  30 tablet  6  . predniSONE (DELTASONE) 5 MG tablet Take 15 mg by mouth daily.      Marland Kitchen pyridostigmine (MESTINON) 60 MG tablet Take 60 mg by mouth 5 (five) times daily. Medication starts to wear off closer to 2.5 hours      . sulfamethoxazole-trimethoprim (SEPTRA DS) 800-160 MG per tablet Take 1 tablet by mouth every 12 (twelve) hours.  20 tablet  0  . tolvaptan (SAMSCA) 15 MG TABS Take 1 tablet (15 mg total) by mouth daily.  10 tablet  0  . torsemide (DEMADEX) 20 MG tablet Take 4 tablets (80 mg total) by mouth 2 (two) times daily.  240 tablet  1  . zolpidem (AMBIEN) 10 MG tablet Take 5 mg by mouth at bedtime.      . [DISCONTINUED] pantoprazole (PROTONIX) 40 MG tablet Take 1 tablet (40 mg total) by mouth daily.  30 tablet  2   No current facility-administered medications for this encounter.     PHYSICAL EXAM: Filed Vitals:   10/18/12 1116  BP: 112/52  Pulse: 84  Weight: 185 lb 8 oz (84.142 kg)  SpO2: 97%    Physical Exam:  General: Elderly Chronically ill appearing. No resp difficulty Sitting in wheel chair HEENT: normal  Neck: supple. JVP 8-9 . Carotids 2+ bilat; no bruits. No lymphadenopathy or thryomegaly appreciated.  Cor: PMI nondisplaced. Regular rate & rhythm. Frequent ectopy No rubs, gallops or murmurs.  Lungs: clear  Abdomen: soft, nontender, distended. No hepatosplenomegaly. No bruits or masses. Good bowel sounds.  Extremities: no cyanosis, clubbing,  rash, R and LLE 2+ lower edema  Neuro: alert & orientedx3, cranial nerves grossly intact. moves all 4 extremities w/o difficulty. Affect pleasant        ASSESSMENT & PLAN:

## 2012-10-20 ENCOUNTER — Other Ambulatory Visit: Payer: Self-pay | Admitting: Internal Medicine

## 2012-10-21 ENCOUNTER — Encounter: Payer: Medicare Other | Admitting: Internal Medicine

## 2012-10-24 ENCOUNTER — Emergency Department (HOSPITAL_BASED_OUTPATIENT_CLINIC_OR_DEPARTMENT_OTHER)
Admission: EM | Admit: 2012-10-24 | Discharge: 2012-10-24 | Discharge status: Against Medical Advice | Payer: Medicare Other | Attending: Emergency Medicine | Admitting: Emergency Medicine

## 2012-10-24 ENCOUNTER — Encounter (HOSPITAL_BASED_OUTPATIENT_CLINIC_OR_DEPARTMENT_OTHER): Payer: Self-pay | Admitting: *Deleted

## 2012-10-24 DIAGNOSIS — L97909 Non-pressure chronic ulcer of unspecified part of unspecified lower leg with unspecified severity: Secondary | ICD-10-CM | POA: Insufficient documentation

## 2012-10-24 DIAGNOSIS — I5040 Unspecified combined systolic (congestive) and diastolic (congestive) heart failure: Secondary | ICD-10-CM | POA: Insufficient documentation

## 2012-10-24 DIAGNOSIS — I10 Essential (primary) hypertension: Secondary | ICD-10-CM | POA: Insufficient documentation

## 2012-10-24 DIAGNOSIS — E1159 Type 2 diabetes mellitus with other circulatory complications: Secondary | ICD-10-CM | POA: Insufficient documentation

## 2012-10-24 DIAGNOSIS — I2510 Atherosclerotic heart disease of native coronary artery without angina pectoris: Secondary | ICD-10-CM | POA: Insufficient documentation

## 2012-10-24 NOTE — ED Notes (Signed)
Pt sts that he does not want to stay to be seen.  He didn't know it would take so long and he is hungry and also he has a cardiologist  Appointment in the morning.  He has refused to get in a gown and refuses to have any blood work done.  Sts he came up because he "didn't have anything else to do."  Wanted to have some drainage on his left leg checked to see if it was poison ivy but he sts he can have the dr check that tomorrow. Can not talk pt in to staying.

## 2012-10-24 NOTE — ED Notes (Signed)
Pt has ulcers to left lower leg x 1-2 weeks with redness and swelling noted. Denies fever.

## 2012-10-25 ENCOUNTER — Encounter (HOSPITAL_COMMUNITY): Payer: Self-pay

## 2012-10-25 ENCOUNTER — Emergency Department (INDEPENDENT_AMBULATORY_CARE_PROVIDER_SITE_OTHER)
Admission: EM | Admit: 2012-10-25 | Discharge: 2012-10-25 | Disposition: A | Discharge status: Home / Self Care | Payer: Medicare Other | Source: Home / Self Care | Attending: Emergency Medicine | Admitting: Emergency Medicine

## 2012-10-25 ENCOUNTER — Telehealth: Payer: Self-pay | Admitting: *Deleted

## 2012-10-25 ENCOUNTER — Encounter (HOSPITAL_COMMUNITY): Payer: Self-pay | Admitting: *Deleted

## 2012-10-25 ENCOUNTER — Ambulatory Visit (HOSPITAL_COMMUNITY)
Admission: RE | Admit: 2012-10-25 | Discharge: 2012-10-25 | Disposition: A | Discharge status: Home / Self Care | Payer: Medicare Other | Source: Ambulatory Visit | Attending: Internal Medicine | Admitting: Internal Medicine

## 2012-10-25 VITALS — BP 170/60 | HR 81 | Ht 67.0 in | Wt 184.5 lb

## 2012-10-25 DIAGNOSIS — E871 Hypo-osmolality and hyponatremia: Secondary | ICD-10-CM | POA: Insufficient documentation

## 2012-10-25 DIAGNOSIS — I83009 Varicose veins of unspecified lower extremity with ulcer of unspecified site: Secondary | ICD-10-CM

## 2012-10-25 DIAGNOSIS — I83029 Varicose veins of left lower extremity with ulcer of unspecified site: Secondary | ICD-10-CM

## 2012-10-25 DIAGNOSIS — I5032 Chronic diastolic (congestive) heart failure: Secondary | ICD-10-CM | POA: Insufficient documentation

## 2012-10-25 LAB — BASIC METABOLIC PANEL
CO2: 25 mEq/L (ref 19–32)
Calcium: 9.8 mg/dL (ref 8.4–10.5)
Chloride: 85 mEq/L — ABNORMAL LOW (ref 96–112)
Creatinine, Ser: 2.98 mg/dL — ABNORMAL HIGH (ref 0.50–1.35)
Glucose, Bld: 232 mg/dL — ABNORMAL HIGH (ref 70–99)
Sodium: 125 mEq/L — ABNORMAL LOW (ref 135–145)

## 2012-10-25 MED ORDER — DOXYCYCLINE HYCLATE 100 MG PO TABS: 100.0000 mg | ORAL_TABLET | Freq: Two times a day (BID) | ORAL | Status: DC

## 2012-10-25 MED ORDER — MUPIROCIN 2 % EX OINT: TOPICAL_OINTMENT | Freq: Three times a day (TID) | CUTANEOUS | Status: AC

## 2012-10-25 NOTE — Patient Instructions (Addendum)
Labs today.    Follow up 6 weeks with Dr. Gala Romney

## 2012-10-25 NOTE — ED Notes (Signed)
Pt  Reports  Redness  Swelling  With  Fluid  Leaking  From  Left  L;eg     For  sev  Weeks  -  He  Reports  Was  Seen er  1  Week  Ago  Had   Foot  Worked  On   And  Was  Placed  On  Anti  Biotics   -  He  Has  An     Company secretary  On       He  Was  At the  [employer] last pm  And   [employer]

## 2012-10-25 NOTE — Progress Notes (Signed)
HPI: [name] is 67 year old male with a PMH of chronic diastolic heart failure, myasthenia gravis, DM, HTN, HL, RBBB and LAFB, and noncompliance. He has been followed closely by Dr. Freeman Caldron in [facility].  He has also had sinus node modification/ATach ablation 08/2011. Notes from [facility] recently indicate LV dysfunction with reported EF 45%. LHC at [facility] 3/13: pLAD 20%, dLAD 20%, mRCA 20%, PDA 20%. Nuc 12/12: no scar or ischemia, EF 42%. He is not on beta blockers due to MG.   ECHO 09/06/12 EF 65-70%  ECHO 09/06/12 EF 65-70%   He has a long h/o noncompliance with medical advice. On 09/03/12 he presented to [facility] ED with dyspnea and lower extremity edema. He signed out AMA despite hyponatremia.   Admitted to [facility] 09/05/12 with dyspnea and lower extremity edema. Diuresed with IV lasix. Discharge weight 187 pounds same as admit. In Feburuay was in the 160s but he says this was due to GI problems.   Admitted to [facility] 09/16/12 with volume overload due to dietary noncompliance.  He required Milrinone and lasix drip. Discharge weight 194 pounds. Discharged on Metolazone 5 mg Monday and Friday and Torsemide 80 mg twice a day. He was discharged 09/23/12   Admitted 10/03/12 with volume overload. He received Milrionone and IV lasix . Discharged on Torsemide 80 mg twice a day and Metolazone 5 mg daily. Discharge weight 189 pounds. He was instructed to follow up with his primary cardiologist Dr. Freeman Caldron in [place] [address] [phone] ([phone] (f). He has follow up appointment 10/13/12 9:15 am with Dr. Elza Rafter. He also agree to [employer] outpatient services which aid in decreasing multiple readmissions.    He presents for 1 week follow up today.  Last visit metolazone added twice weekly.  He has also been working hard on fluid limitation and low sodium diet.  He thinks he is doing a much better job.  He has nausea this afternoon but has yet to eat today and feels he is just hungry.  He  denies orthopnea or PND.  Weight is stable 179-181 pounds.  He was just evaluated in the urgent care for lower extremity ulcers.  He was given doxycycline and instructions to follow up with wound center. His legs are currently wrapped.      ROS: All systems negative except as listed in HPI, PMH and Problem List.  Past Medical History  Diagnosis Date  . Sinus tachycardia 2006    a. Nonobstructive CAD by cath 2013. b. s/p atrial tachycardia ablation 08/2011 (sinus node modification)  . Orthostatic hypotension   . Unspecified essential hypertension   . Hyperlipidemia   . Cervical spondylosis with radiculopathy   . Syrinx     T5-T6  . Cord compression     Cord compression syndrome C5-C6, C6-C7  . ADD (attention deficit disorder)   . Decreased libido   . Depression   . Diabetes mellitus type 2, uncontrolled, with complications     CAD, orthostatic hypotension, erectile dysfunction  . Pulmonary nodule, left     Evaluated 04/2011 by pulmonology - felt to be benign granuloma  . Peripheral edema   . Coronary artery disease, non-occlusive     Mild nonobstructive CAD by cath 08/2011  . Systolic congestive heart failure     By echo 2013 at [facility] - reported EF 45% per pt  . Colitis     with surface exudate  . Hemorrhoids     small  .  Gastritis   . Clostridium difficile infection 02/15/2012    causing pseudomembranosu colitis  . Diastolic heart failure     grade 2 per echocardiogram (2011)  . Right bundle branch block and left posterior fascicular block   . Sleep apnea     occassionally uses cpap  . 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 aspirin EC 81 MG EC tablet Take 1 tablet (81 mg total) by mouth daily.  30 tablet  11  . benazepril (LOTENSIN) 20 MG tablet Take 1 tablet (20 mg total) by mouth daily.  30 tablet  2  . doxycycline (VIBRA-TABS) 100 MG tablet Take 1 tablet (100 mg  total) by mouth 2 (two) times daily.  20 tablet  0  . mirtazapine (REMERON) 30 MG tablet Take 1 tablet (30 mg total) by mouth at bedtime.  30 tablet  1  . mupirocin ointment (BACTROBAN) 2 % Apply topically 3 (three) times daily.  22 g  0  . mycophenolate (CELLCEPT) 500 MG tablet Take 1,000 mg by mouth 2 (two) times daily.       Melene Muller ON 11/11/2012] oxyCODONE (OXY IR/ROXICODONE) 5 MG immediate release tablet Take 1 tablet (5 mg total) by mouth every 4 (four) hours as needed. For pain  120 tablet  0  . pantoprazole (PROTONIX) 40 MG tablet Take 1 tablet (40 mg total) by mouth daily.  30 tablet  6  . predniSONE (DELTASONE) 5 MG tablet Take 15 mg by mouth daily.      Marland Kitchen pyridostigmine (MESTINON) 60 MG tablet Take 60 mg by mouth 5 (five) times daily. Medication starts to wear off closer to 2.5 hours      . tolvaptan (SAMSCA) 15 MG TABS Take 1 tablet (15 mg total) by mouth daily.  10 tablet  0  . torsemide (DEMADEX) 20 MG tablet Take 4 tablets (80 mg total) by mouth 2 (two) times daily.  240 tablet  1  . zolpidem (AMBIEN) 10 MG tablet Take 5 mg by mouth at bedtime.      . metolazone (ZAROXOLYN) 5 MG tablet Take 5 mg every Monday and Friday and as needed.  10 tablet  3  . [DISCONTINUED] pantoprazole (PROTONIX) 40 MG tablet Take 1 tablet (40 mg total) by mouth daily.  30 tablet  2   No current facility-administered medications for this encounter.     PHYSICAL EXAM: Filed Vitals:   10/25/12 1410  BP: 170/60  Pulse: 81  Height: 5\' 7"  (1.702 m)  Weight: 184 lb 8 oz (83.689 kg)  SpO2: 99%  Repeat BP 142/58  Physical Exam:  General: Elderly Chronically ill appearing. No resp difficulty Sitting in wheel chair HEENT: normal  Neck: supple. JVP 8-9 . Carotids 2+ bilat; no bruits. No lymphadenopathy or thryomegaly appreciated.  Cor: PMI nondisplaced. Regular rate & rhythm.  No rubs, gallops or murmurs.  Lungs: clear  Abdomen: soft, nontender, distended. No hepatosplenomegaly. No bruits or masses. Good  bowel sounds.  Extremities: no cyanosis, clubbing, rash, R and LLE 2+ lower edema. ACE wraps bilateral lower extremities. Neuro: alert & orientedx3, cranial nerves grossly intact. moves all 4 extremities w/o difficulty. Affect pleasant        ASSESSMENT & PLAN:

## 2012-10-25 NOTE — Telephone Encounter (Signed)
Since patient is seen [name] he does not need to see any of [place] this week. If he would go today to urgent care today for his foot he can follow in our clinic early next week for a follow of for his foot.

## 2012-10-25 NOTE — Assessment & Plan Note (Signed)
He has finished tolvaptan.  Will recheck sodium level today.

## 2012-10-25 NOTE — ED Provider Notes (Signed)
Chief Complaint:   Chief Complaint  Patient presents with  . Leg Swelling    History of Present Illness:   [name] is a 67 year old male who presents with a two-week history of left and right leg swelling and ulcerations of his left leg. The patient has a long-standing history of congestive heart failure and myasthenia gravis. He is also diabetic. He's also had a history of poor medication compliance, and often misses appointments, or signs of AGAINST MEDICAL ADVICE. Both legs have been swollen for months or possibly even for years, and the left leg has been oozing fluid. They're moderately painful. He has worn [name] hose in the past. He's taking torsemide and metolazone for fluid retention and is nothing I can do to improve upon this. He was hospitalized from April 28 through May 2 for congestive heart failure. He was seen on may 648 toenail that had become fungal infected and had been avulsed. It was completely avulsed in the emergency room and he was sent home on Septra. He denies any fever or chills he's had some serous sanguinous drainage. The left ankle is somewhat erythematous but not high and slightly tender to palpation.  Review of Systems:  Other than noted above, the patient denies any of the following symptoms: Systemic:  No fever, chills, sweats, weight gain or loss. Respiratory:  No coughing, wheezing, or shortness of breath. Cardiac:  No chest pain, tightness, pressure or syncope. GI:  No abdominal pain, swelling, distension, nausea, or vomiting. GU:  No dysuria, frequency, or hematuria. Ext:  No joint pain, muscle pain, or weakness. Skin:  No rash or itching. Neuro:  No paresthesias.  PMFSH:  Past medical history, family history, social history, meds, and allergies were reviewed.  He has myasthenia gravis, congestive heart failure, and diabetes. Dr. Milas Kocher is his cardiologist and Dr. Tonny Branch is his primary care physician. He's on a long list of medications and has a long list  of allergies.  Physical Exam:   Vital signs:  BP 128/78  Pulse 60  Temp(Src) 97.9 F (36.6 C) (Oral)  Resp 16  SpO2 99% Gen:  Alert, oriented, in no distress. Neck:  No tenderness, adenopathy, or JVD. Lungs:  Breath sounds clear and equal bilaterally.  No rales, rhonchi or wheezes. Heart:  Regular rhythm, no gallops or murmers. Abdomen:  Soft, nontender, no organomegaly or mass. Ext:  He has pitting edema of both ankles and feet going about two thirds the way up his lower leg. Pedal pulses were not felt. He has multiple areas of ulceration around his left ankle just above the malleolus and. Most of these are small and shallow. Some of them are crusted. Some of them are draining serosanguineous fluid. He has stasis skin changes bilaterally. Neuro:  Alert and oriented times 3.  No muscle weakness.  Sensation intact to light touch. Skin:  Warm and dry.  No rash or skin lesions.  Course in Urgent Care Center:  Antibiotic ointment was applied to the skin ulcers followed by not here in dressing and both feet were wrapped from the foot to the knee with overlapping Ace wraps.  Assessment:  The encounter diagnosis was Stasis ulcer, left.  These appear to be stasis ulcers. They may be infected. He's been on the Septra without much improvement so I told him to stop this was switched to doxycycline instead. He'll need followup at the wound care center.  Plan:   1.  The following meds were prescribed:   Discharge  Medication List as of 10/25/2012  1:21 PM    START taking these medications   Details  doxycycline (VIBRA-TABS) 100 MG tablet Take 1 tablet (100 mg total) by mouth 2 (two) times daily., Starting 10/25/2012, Until Discontinued, Normal    mupirocin ointment (BACTROBAN) 2 % Apply topically 3 (three) times daily., Starting 10/25/2012, Until Discontinued, Normal       2.  The patient was instructed in symptomatic care and handouts were given. 3.  The patient was told to return if becoming  worse in any way, if no better in 3 or 4 days, and given some red flag symptoms such as fever worsening pain that would indicate earlier return. 4.  Follow up at [facility] as soon as possible.    Reuben Likes, MD 10/25/12 2024

## 2012-10-25 NOTE — Telephone Encounter (Signed)
I got a note that he went to the ER but didn't want to stay because it took so long and that he only came "cause he had nothing better to do."    He does have an appointment today with Dr. Clarise Cruz and then has one with Dr. Loistine Chance tomorrow and Dr. Allena Katz on 5/22.  He probably doesn't need both [place] and [place].  As long as he isn't having fever, chills, or low blood pressure he can be evaluated at either of these appointments.

## 2012-10-25 NOTE — ED Notes (Signed)
Double   [employer]ce  [employer]

## 2012-10-25 NOTE — Assessment & Plan Note (Signed)
Volume status is improving slowly on torsemide 80 mg BID and metolazone 2x weekly.  Will continue current regimen in conjunction with fluid restrictions.  He will continue to weigh daily.  Have again educated on low sodium diet/choices and fluid restrictions.  He has refused home health again today.  He has follow up with cardiologist at [facility], Dr. Okey Dupre in 2 weeks and then will follow up with HF clinic in 2-3 weeks after Dr. Serita Kyle appointment.  BMET today.

## 2012-10-25 NOTE — Telephone Encounter (Signed)
Pt calls and states he has developed blisters, some open and draining, yellowish liquid on his L foot, ankle and calf, painful. One appt available today but pt states he has appt w/ [name](?) today at 1430 and the clinic appt is at 1445. Pt is ask to go to urg care and also is made an appt tomorrow in [facility] for eval, states he will go to [place] care now

## 2012-10-26 ENCOUNTER — Ambulatory Visit: Payer: Medicare Other | Admitting: Internal Medicine

## 2012-10-26 ENCOUNTER — Telehealth: Payer: Self-pay | Admitting: *Deleted

## 2012-10-26 NOTE — Telephone Encounter (Signed)
I called Mr. Mcartor at the request of several employees. He was seen in urgent care yesterday because of draining blisters on his left foot and he could not get an appointment in [facility] for which I apologized.After input involving the nurse, Dr. Loistine Chance, the AD, and lab tech, Mr. Sproule agreed that he would not come in for his appointment tomorrow. He will attend (and Venita Sheffield will schedule) an appointment for next week, preferably with his PCP, to get labs done for a) [facility] and b) his office visit. This plan would both keep him on track with his [place] doctor, make sense with Dr. Clarise Cruz, and provide f/u for his urgent care visit and kidney fxn. This was a lenghty phone call and at times I needed to calm him down. He understood when told WHY this made sense in relation to his care/labs/meds.  Dr. Tonny Branch, Mr. Lall is very anxious to find out who he has been reassigned to and stated that you would recommend a new PCP for him, please advise.

## 2012-10-26 NOTE — Telephone Encounter (Signed)
I have recommended to [name] that [name] be placed with an attending physician following my graduation.  Unfortunately I have no more clinic openings until I graduate so whomever he can get a spot with be the best.  I know Dr. Josem Kaufmann has clinic on Friday morning but he likely will need to be seen before Friday given his creatinine of almost 3.  I would suggest Tuesday or Wednesday given that Monday is Memorial Day.

## 2012-10-27 ENCOUNTER — Ambulatory Visit: Payer: Medicare Other | Admitting: Internal Medicine

## 2012-10-27 ENCOUNTER — Other Ambulatory Visit: Payer: Medicare Other

## 2012-10-28 ENCOUNTER — Telehealth: Payer: Self-pay | Admitting: *Deleted

## 2012-10-28 ENCOUNTER — Telehealth (HOSPITAL_COMMUNITY): Payer: Self-pay | Admitting: *Deleted

## 2012-10-28 DIAGNOSIS — I5022 Chronic systolic (congestive) heart failure: Secondary | ICD-10-CM

## 2012-10-28 MED ORDER — TORSEMIDE 20 MG PO TABS: 60.0000 mg | ORAL_TABLET | Freq: Two times a day (BID) | ORAL | Status: DC

## 2012-10-28 NOTE — Telephone Encounter (Signed)
Coordination of labs and visits: Dr. Clarise Cruz has ordered labs for Tuesday. They will be drawn in [facility] Tuesday am normal turnaround. Lab aware and will make Epic adjustments PRN. Lab visit for Thursday am with stat turnaround will be cancelled per Dr. Tonny Branch. [name] will keep his MD visit on Thursday around 1000-1030 with Dr. Heloise Beecham. [name] is aware and positive about plan.

## 2012-10-28 NOTE — Telephone Encounter (Signed)
Pt will have bmet done on Tue to recheck kidney function

## 2012-11-01 ENCOUNTER — Other Ambulatory Visit (INDEPENDENT_AMBULATORY_CARE_PROVIDER_SITE_OTHER): Payer: Medicare Other

## 2012-11-01 DIAGNOSIS — G7000 Myasthenia gravis without (acute) exacerbation: Secondary | ICD-10-CM

## 2012-11-01 DIAGNOSIS — Z79899 Other long term (current) drug therapy: Secondary | ICD-10-CM

## 2012-11-01 DIAGNOSIS — I5022 Chronic systolic (congestive) heart failure: Secondary | ICD-10-CM

## 2012-11-01 LAB — HEPATIC FUNCTION PANEL
ALT: 8 U/L (ref 0–53)
AST: 15 U/L (ref 0–37)
Alkaline Phosphatase: 110 U/L (ref 39–117)
Bilirubin, Direct: 0.1 mg/dL (ref 0.0–0.3)
Indirect Bilirubin: 0.3 mg/dL (ref 0.0–0.9)
Total Protein: 6.1 g/dL (ref 6.0–8.3)

## 2012-11-01 LAB — BASIC METABOLIC PANEL WITH GFR
BUN: 64 mg/dL — ABNORMAL HIGH (ref 6–23)
CO2: 26 mEq/L (ref 19–32)
GFR, Est African American: 64 mL/min
Glucose, Bld: 209 mg/dL — ABNORMAL HIGH (ref 70–99)
Potassium: 4.5 mEq/L (ref 3.5–5.3)
Sodium: 123 mEq/L — ABNORMAL LOW (ref 135–145)

## 2012-11-01 LAB — CBC
MCH: 27.8 pg (ref 26.0–34.0)
MCHC: 36.1 g/dL — ABNORMAL HIGH (ref 30.0–36.0)
MCV: 76.9 fL — ABNORMAL LOW (ref 78.0–100.0)
Platelets: 328 10*3/uL (ref 150–400)
RBC: 3.89 MIL/uL — ABNORMAL LOW (ref 4.22–5.81)
RDW: 12.4 % (ref 11.5–15.5)

## 2012-11-01 LAB — GAMMA GT: GGT: 45 U/L (ref 7–51)

## 2012-11-01 NOTE — Progress Notes (Signed)
Lab results faxed to Dr Clarisa Kindred, [facility] Dept. of Neurology,  11-01-2012 at 14:15pm Spoke to [place] at [phone], Dr. Jeannette How office and she confirmed fax was received.  Alric Quan, [facility]

## 2012-11-03 ENCOUNTER — Encounter: Payer: Self-pay | Admitting: Internal Medicine

## 2012-11-03 ENCOUNTER — Ambulatory Visit (INDEPENDENT_AMBULATORY_CARE_PROVIDER_SITE_OTHER): Payer: Medicare Other | Admitting: Internal Medicine

## 2012-11-03 VITALS — BP 146/67 | HR 79 | Temp 98.0°F | Wt 188.0 lb

## 2012-11-03 DIAGNOSIS — I5032 Chronic diastolic (congestive) heart failure: Secondary | ICD-10-CM

## 2012-11-03 DIAGNOSIS — E1165 Type 2 diabetes mellitus with hyperglycemia: Secondary | ICD-10-CM

## 2012-11-03 NOTE — Patient Instructions (Signed)
1. Keep taking your torsemide and metolazone per the heart failure clinic. 2. Call us if your weight is increasing as you may need higher dose of torsemide. 3. Follow up with wound care clinic

## 2012-11-03 NOTE — Assessment & Plan Note (Signed)
Has been following in Dr. Prescott Gum HF clinic until appt w Dr. Elza Rafter at [facility] 11/21/12. At last visit on 5/20, noted to have AoCRF w Cr of 3. Patient reports recently more compliance with fluid restricted diet in addition to diuretics, which may have led to prerenal ARF due to volume depletion. He was instructed by cardiology to hold diuretics x3 days then restarted on lower dose torsemide80-->60BID and continued metolazone.  His weight here is 188 compared to 184 at heart failure clinic, but pt insists that weight stable on home scale at 183-185, most recently 185 yesterday. Cr now back to baseline 1.3. Also with considerable improvement of LE edema and resolution of blisters. Reports compliance with fluid and sodium restriction. - Continue torsemide 60 BID and metolazone twice weekly per cardiology. Weight is up a little today but overall not too volume overloaded clinically. Instructed him to call us if weight at home increases >2lbs, as he may need to return to higher dose of torsemide that he was on previously (80 BID). Again emphasized importance of compliance with diuretics, fluid restriction, daily weights.

## 2012-11-03 NOTE — Progress Notes (Signed)
Patient ID: [name], male   DOB: 28-May-1946, 67 y.o.   MRN: [id number]  Subjective:   Patient ID: [name] male   DOB: [birth year] 67 y.o.   MRN: [id number]  HPI: [name] is a 67 y.o. male with history of nonobstructive CAD, combined systolic and diastolic CHF, poorly controlled T2DM, myasthenia gravis and poor medication compliance presenting for follow up of lower extremity stasis ulcers. He presented to urgent care 10/25/12 complaining of fluid oozing from his L leg.  He has chronic venous stasis form poorly controlled heart failure. He was prescribed doxycycline, topical antibiotic ointment, and instructed to follow up with wound care center. Today he reports interval improvement in LE edema with no more oozing or pain and improving erythema. He continues taking doxycycline. He has follow up appt w wound care clinic later this week.  He was also seen in HF clinic 10/25/12, and noted to have good volume status with torsemide 80mg  BID and metolazone 5mg  twice weekly. At the time of that visit, labs were drawn which demonstrated acute on chronic renal failure with Cr of 2.98. He was instructed to stop diuretics for 3 days and resume lower dose of torsemide 60mg  BID with regular metolazone 5mg  Monday and Friday on 10/28/12. He [facility] 11/01/12 for repeat labs which show resolution of ARF with Cr of 1.32. He says he is feeling much improved on current diuretic doses. He believes his LE edema has gone down considerably. Reports compliance with fluid restrictuion.  He has chronic hypontremia and recently complelted course of tolvaptan as outpatient. Sodium this week stable at 123. He is scheduled to follow up with his [facility] cardiologist Dr. Elza Ward on June 16th.  In terms of his DM, he says he is not taking insulin and refuses to take it. CBG here w blood sugar 143. He demanded orange juice because he believes 143 is in hypoglycemia range to him. He has maintained the  conviction for some time now that any blood sugar less than 200 is unacceptable for him. This has made it very difficult to control his DM.   Past Medical History  Diagnosis Date  . Sinus tachycardia 2006    a. Nonobstructive CAD by cath 2013. b. s/p atrial tachycardia ablation 08/2011 (sinus node modification)  . Orthostatic hypotension   . Unspecified essential hypertension   . Hyperlipidemia   . Cervical spondylosis with radiculopathy   . Syrinx     T5-T6  . Cord compression     Cord compression syndrome C5-C6, C6-C7  . ADD (attention deficit disorder)   . Decreased libido   . Depression   . Diabetes mellitus type 2, uncontrolled, with complications     CAD, orthostatic hypotension, erectile dysfunction  . Pulmonary nodule, left     Evaluated 04/2011 by pulmonology - felt to be benign granuloma  . Peripheral edema   . Coronary artery disease, non-occlusive     Mild nonobstructive CAD by cath 08/2011  . Systolic congestive heart failure     By echo 2013 at [facility] - reported EF 45% per pt  . Colitis     with surface exudate  . Hemorrhoids     small  . Gastritis   . Clostridium difficile infection 02/15/2012    causing pseudomembranosu colitis  . Diastolic heart failure     grade 2 per echocardiogram (2011)  . Right bundle branch block and left posterior fascicular block   . Sleep apnea  occassionally uses cpap  . 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 aspirin EC 81 MG EC tablet Take 1 tablet (81 mg total) by mouth daily.  30 tablet  11  . benazepril (LOTENSIN) 20 MG tablet Take 1 tablet (20 mg total) by mouth daily.  30 tablet  2  . doxycycline (VIBRA-TABS) 100 MG tablet Take 1 tablet (100 mg total) by mouth 2 (two) times daily.  20 tablet  0  . metolazone (ZAROXOLYN) 5 MG tablet Take 5 mg every Monday and Friday and as needed.  10 tablet  3  . mirtazapine (REMERON)  30 MG tablet Take 1 tablet (30 mg total) by mouth at bedtime.  30 tablet  1  . mupirocin ointment (BACTROBAN) 2 % Apply topically 3 (three) times daily.  22 g  0  . mycophenolate (CELLCEPT) 500 MG tablet Take 1,000 mg by mouth 2 (two) times daily.       Melene Muller ON 11/11/2012] oxyCODONE (OXY IR/ROXICODONE) 5 MG immediate release tablet Take 1 tablet (5 mg total) by mouth every 4 (four) hours as needed. For pain  120 tablet  0  . pantoprazole (PROTONIX) 40 MG tablet Take 1 tablet (40 mg total) by mouth daily.  30 tablet  6  . predniSONE (DELTASONE) 5 MG tablet Take 15 mg by mouth daily.      Marland Kitchen pyridostigmine (MESTINON) 60 MG tablet Take 60 mg by mouth 5 (five) times daily. Medication starts to wear off closer to 2.5 hours      . tolvaptan (SAMSCA) 15 MG TABS Take 1 tablet (15 mg total) by mouth daily.  10 tablet  0  . torsemide (DEMADEX) 20 MG tablet Take 3 tablets (60 mg total) by mouth 2 (two) times daily.  240 tablet  1  . zolpidem (AMBIEN) 10 MG tablet Take 5 mg by mouth at bedtime.      . [DISCONTINUED] pantoprazole (PROTONIX) 40 MG tablet Take 1 tablet (40 mg total) by mouth daily.  30 tablet  2   No current facility-administered medications for this visit.   Family History  Problem Relation Age of Onset  . Breast cancer Mother   . Other Father     Beck's Sarcoid  . Colon cancer Neg Hx   . Prostate cancer Neg Hx    History   Social History  . Marital Status: Single    Spouse Name: N/A    Number of Children: 1  . Years of Education: N/A   Occupational History  . Retired     Surveyor, minerals   Social History Main Topics  . Smoking status: Former Smoker -- 2.50 packs/day for 30 years    Quit date: 06/09/1991  . Smokeless tobacco: Never Used  . Alcohol Use: No  . Drug Use: No  . Sexually Active: None   Other Topics Concern  . None   Social History Narrative   Has a single man's lifestyle and will remind anyone of it when asked.   Prefers to see only male physicians   Retired  Surveyor, minerals      Lives at home with mother (50 y.o)   Has 1 son   Review of Systems: 10 pt ROS performed, pertinent positives and negatives noted in HPI Objective:  Physical Exam: Filed Vitals:   11/03/12 1040  BP: 146/67  Pulse: 79  Weight: 188 lb (85.276 kg)  SpO2: 100%   Vitals reviewed. General: standing and pacing around room Cardiac: RRR, no rubs, murmurs or gallops. 8cm JVD Pulm: clear to auscultation bilaterally, no rales Abd: soft, nontender, nondistended, BS present Ext: chronic venous stasis changes and pitting edema of bilateral LE up to 2/3 to the knee. Crusting,healing erosions present over medial L lower leg. No significant erythema or pain. No active oozing or blisters. No abscess or fluctuance.  Neuro: alert and oriented X3, cranial nerves II-XII grossly intact,    Assessment & Plan:   Please see problem-based charting for assessment and plan.

## 2012-11-03 NOTE — Assessment & Plan Note (Signed)
Lab Results  Component Value Date   HGBA1C 9.1 11/03/2012   HGBA1C 11.9 07/12/2012   HGBA1C 9.9* 03/17/2012     Assessment: Diabetes control: poor control (HgbA1C >9%) Progress toward A1C goal:  improved   Plan: Medications:  Pt refuses ANY therapy for DM, continues w fixed belief that any BG <200 is hypoglycemic range for him.

## 2012-11-04 ENCOUNTER — Telehealth: Payer: Self-pay | Admitting: *Deleted

## 2012-11-04 NOTE — Telephone Encounter (Signed)
  Reason for call:   I placed an outgoing call to [name] at 4:00  PM regarding his numbness in his feet that he states he has had for a month.    Last encounter / Pertinent Data:   He was last seen in Clinic by Dr. Loura Pardon for evaluation of his diabetes and heart failure.  He did not mention this problem then.   Assessment/ Plan:   I left a message for him about this problem stating that it is likely secondary to his diabetes and chronic venous stasis.  I also stated that if this problem becomes troublesome to make a follow up appointment in the clinic to be evaluated for the problem and to discuss treatment options including medication as well as tight control of his blood sugar.    As always, pt is advised that if symptoms worsen or new symptoms arise, they should go to an urgent care facility or to to ER for further evaluation.   Leodis Sias, MD   11/04/2012, 4:06 PM

## 2012-11-04 NOTE — Progress Notes (Signed)
Case discussed with Dr. Ziemer at the time of the visit, immediately after the resident saw the patient.  I reviewed the resident's history and exam and pertinent patient test results.  I agree with the assessment, diagnosis and plan of care documented in the resident's note.      

## 2012-11-04 NOTE — Telephone Encounter (Signed)
Pt called with c/o numbness to bottom of feet.  He is stating he had a toenail removed on left foot and thinks this has made the numbness worse.Marland Kitchen He is having swelling in both feet on and off.  This is not new.  He also  has had numbness on and off for about a month in both feet, more on the leftfoot. He states this is neuropathy and he is asking for a medication to stop the numbness. Pt does have uncontrolled DM and states he has been off him diet the last few days.  Please advise Pt # [id number]

## 2012-11-08 ENCOUNTER — Encounter (HOSPITAL_BASED_OUTPATIENT_CLINIC_OR_DEPARTMENT_OTHER): Payer: Medicare Other | Attending: General Surgery

## 2012-11-08 DIAGNOSIS — Z79899 Other long term (current) drug therapy: Secondary | ICD-10-CM | POA: Insufficient documentation

## 2012-11-08 DIAGNOSIS — G7000 Myasthenia gravis without (acute) exacerbation: Secondary | ICD-10-CM | POA: Insufficient documentation

## 2012-11-08 DIAGNOSIS — I872 Venous insufficiency (chronic) (peripheral): Secondary | ICD-10-CM | POA: Insufficient documentation

## 2012-11-08 DIAGNOSIS — I890 Lymphedema, not elsewhere classified: Secondary | ICD-10-CM | POA: Insufficient documentation

## 2012-11-08 DIAGNOSIS — I509 Heart failure, unspecified: Secondary | ICD-10-CM | POA: Insufficient documentation

## 2012-11-08 DIAGNOSIS — L97809 Non-pressure chronic ulcer of other part of unspecified lower leg with unspecified severity: Secondary | ICD-10-CM | POA: Insufficient documentation

## 2012-11-08 DIAGNOSIS — R609 Edema, unspecified: Secondary | ICD-10-CM | POA: Insufficient documentation

## 2012-11-08 DIAGNOSIS — E119 Type 2 diabetes mellitus without complications: Secondary | ICD-10-CM | POA: Insufficient documentation

## 2012-11-09 ENCOUNTER — Telehealth (HOSPITAL_COMMUNITY): Payer: Self-pay | Admitting: *Deleted

## 2012-11-09 NOTE — Progress Notes (Signed)
[facility]  NAME:  DANFORD, TAT NO.:  [id number]  MEDICAL RECORD NO.:  [id number]      DATE OF BIRTH:  1945/07/02  PHYSICIAN:  Ardath Sax, M.D.           VISIT DATE:                                  OFFICE VISIT   [name] is a 67 year old gentleman, who has myasthenia gravis.  He also has a history of congestive heart failure, and he also has a history of right bundle branch block and he has chronic edema and lymphedema of his legs and now presents with venous ulcer on his left leg.  It is about 2 cm in diameter and today we cleansed it and were putting on a Unna boot and covering the ulcer with silver alginate. This man says that he had a fair amount of edema despite taking Lasix. He also takes Deltasone 15 mg a day.  He is on oxycodone for pain.  He is also on Remeron, Zaroxolyn.  He is on doxycycline for this ulcer and he is also on Lotensin.  I can feel good pulses, but he definitely has 2+ edema of both of his legs.  He says he wears support stockings, but that the only way he can get rid of the swelling is to take Lasix and elevate his legs.  On examination today, I noticed a moderately obese 6- year-old man, somewhat anxious with a blood pressure of 156/73, respirations 16, temperature 97.9.  He weighs 190 pounds.  He is 5 feet 7 inches.  His blood sugar was 200 today.  So we have this diabetic man with some venous stasis, who also has myasthenia gravis and some congestive heart failure that we are going to treat this venous stasis ulcer on his left leg with some collagen and an Radio broadcast assistant.     Ardath Sax, M.D.     PP/MEDQ  D:  11/08/2012  T:  11/09/2012  Job:  [id number]

## 2012-11-09 NOTE — Telephone Encounter (Signed)
Pt called to report he forgot to take his Metolazone on Monday and then did not take it because he did not want to be off schedule, he reports his wt is up about 4 lbs now and he is more SOB than usual, he states he was having a hard time last night due to breathing, he has not yet taken his meds today and wanted to know what he should take prior to taking anything, advised pt to take his usual Torsemide 60 mg now along with a metolazone, if wt not coming down or breathing not improving he will let me know

## 2012-11-11 ENCOUNTER — Telehealth: Payer: Self-pay | Admitting: *Deleted

## 2012-11-11 ENCOUNTER — Telehealth: Payer: Self-pay | Admitting: Nurse Practitioner

## 2012-11-11 NOTE — Telephone Encounter (Signed)
I reviewed his sodium and it has been stable over the last few times he has been seen.  I'll review the paperwork and note when it arrives.  Leodis Sias, MD Internal Medicine Resident, PGY III Co-Chief Resident, Internal Medicine Pager: [phone] 11/11/2012 6:48 PM

## 2012-11-11 NOTE — Telephone Encounter (Signed)
[name] called in this evening b/c he had gotten a call earlier from "Natalia Leatherwood" in CHF clinic recommending that he call back to arrange a f/u appt.  He was concerned that I didn't know who Natalia Leatherwood was or why she had called and played all of his answering machine messages back over the phone for me so that I could hear that Natalia Leatherwood had in fact called him.  I reviewed his recent labs with him, as he has somewhat chronic hyponatremia, and advised that though his Na is low (123), it's within the range that it has been dating back to early April.  He had no acute complaints tonight.  I recommended that he call back on Monday to schedule an appt with CHF clinic as the message had advised.  He verbalized understanding and was grateful for the call back.

## 2012-11-11 NOTE — Telephone Encounter (Signed)
Pt called to report Dr Dimas Aguas from [facility] sent a letter to him stating his sodium was low.  Pt states copy has been sent to Dr Tonny Branch and he wants you to be aware of that. I check the fax machine and labs have not yet arrived.  FYI

## 2012-11-15 ENCOUNTER — Encounter: Payer: Medicare Other | Admitting: Internal Medicine

## 2012-11-15 ENCOUNTER — Telehealth: Payer: Self-pay | Admitting: *Deleted

## 2012-11-15 NOTE — Telephone Encounter (Signed)
Unfortunately I will not be having anymore spots in clinic and with my schedule as the bridge resident I will not be able to make an special appointment to see him.  I attempted to contact the patient to inform him of this and left a message to that effect.    Leodis Sias, MD Internal Medicine Resident, PGY III Co-Chief Resident, Internal Medicine Pager: [phone] 11/15/2012 1:12 PM

## 2012-11-15 NOTE — Telephone Encounter (Signed)
Pt calls and states he cancelled todays appt so possibly " christopher" would come in special and see him, he is speaking of dr Tonny Branch, he is informed that dr Tonny Branch will not be seeing any more pts in clinic and as bad as it may hurt he(pt) will need to establish new professional relationships with the remaining providers and if he feels that he is sick he should keep the appt, he reschedules but states he may not in hopes "christopher" will be able to see him, he is once again told NO and encouraged to keep appt w/ dr ziemer wed 6/11

## 2012-11-16 ENCOUNTER — Ambulatory Visit (INDEPENDENT_AMBULATORY_CARE_PROVIDER_SITE_OTHER): Payer: Medicare Other | Admitting: Internal Medicine

## 2012-11-16 ENCOUNTER — Encounter: Payer: Self-pay | Admitting: Internal Medicine

## 2012-11-16 VITALS — BP 141/73 | HR 86 | Temp 97.5°F | Ht 67.0 in | Wt 196.4 lb

## 2012-11-16 DIAGNOSIS — Z79899 Other long term (current) drug therapy: Secondary | ICD-10-CM

## 2012-11-16 DIAGNOSIS — I509 Heart failure, unspecified: Secondary | ICD-10-CM

## 2012-11-16 DIAGNOSIS — I5042 Chronic combined systolic (congestive) and diastolic (congestive) heart failure: Secondary | ICD-10-CM

## 2012-11-16 DIAGNOSIS — I5032 Chronic diastolic (congestive) heart failure: Secondary | ICD-10-CM

## 2012-11-16 DIAGNOSIS — G7000 Myasthenia gravis without (acute) exacerbation: Secondary | ICD-10-CM

## 2012-11-16 LAB — BASIC METABOLIC PANEL WITH GFR
BUN: 36 mg/dL — ABNORMAL HIGH (ref 6–23)
CO2: 29 mEq/L (ref 19–32)
Calcium: 9.5 mg/dL (ref 8.4–10.5)
Chloride: 81 mEq/L — ABNORMAL LOW (ref 96–112)
Creat: 1.37 mg/dL — ABNORMAL HIGH (ref 0.50–1.35)
Glucose, Bld: 138 mg/dL — ABNORMAL HIGH (ref 70–99)

## 2012-11-16 LAB — CBC WITH DIFFERENTIAL/PLATELET
Basophils Absolute: 0.1 10*3/uL (ref 0.0–0.1)
Eosinophils Absolute: 0.4 10*3/uL (ref 0.0–0.7)
Eosinophils Relative: 6 % — ABNORMAL HIGH (ref 0–5)
HCT: 30.2 % — ABNORMAL LOW (ref 39.0–52.0)
Lymphocytes Relative: 11 % — ABNORMAL LOW (ref 12–46)
MCH: 27.5 pg (ref 26.0–34.0)
MCV: 76.1 fL — ABNORMAL LOW (ref 78.0–100.0)
Monocytes Absolute: 1 10*3/uL (ref 0.1–1.0)
Platelets: 299 10*3/uL (ref 150–400)
RDW: 12.4 % (ref 11.5–15.5)
WBC: 7.9 10*3/uL (ref 4.0–10.5)

## 2012-11-16 LAB — HEPATIC FUNCTION PANEL
ALT: 11 U/L (ref 0–53)
Albumin: 3.5 g/dL (ref 3.5–5.2)
Alkaline Phosphatase: 96 U/L (ref 39–117)
Indirect Bilirubin: 0.3 mg/dL (ref 0.0–0.9)
Total Protein: 6.5 g/dL (ref 6.0–8.3)

## 2012-11-16 LAB — GAMMA GT: GGT: 43 U/L (ref 7–51)

## 2012-11-16 MED ORDER — TORSEMIDE 20 MG PO TABS: 80.0000 mg | ORAL_TABLET | Freq: Two times a day (BID) | ORAL | Status: DC

## 2012-11-16 MED ORDER — METOLAZONE 5 MG PO TABS: 5.0000 mg | ORAL_TABLET | Freq: Every day | ORAL | Status: AC

## 2012-11-16 NOTE — Assessment & Plan Note (Signed)
Complaint w cellcept and mestinon. Labs ordered today (GGT, Liver, CBC) to be faxed to Dr. Dimas Aguas at [facility]

## 2012-11-16 NOTE — Patient Instructions (Signed)
1. Keep taking your torsemide 80mg  TWICE a day. Add back metolazone 5mg  once a day. Keep appointment with Dr. Elza Rafter next week. 2. I will call about your labwork.

## 2012-11-16 NOTE — Assessment & Plan Note (Addendum)
It seems pt not actually taking diuretic therapy as recommended by both our clinic and HF clinic. He reports taking torsemide 80mg  BID and no metolazone, per last HF note recommended torsemide 60mg  BID and metolazone 5mg  twice weekly. At any rate, patient continues to gain weight and have worsening LE edema, dyspnea, and orthopnea. No crackles on lung exam, but extremity edema persists. Questionable compliance w fluid and sodium restriction.  Weight today is 196 lbs, representing a 16 lb weight gain over the past month. - Continue torsemide 80mg  BID. - Start metolazone 5mg  DAILY until appt w [facility] cardiologist Dr. Elza Rafter on 06/16 - Bmet today

## 2012-11-16 NOTE — Progress Notes (Signed)
Patient ID: [name], male   DOB: October 03, 1945, 67 y.o.   MRN: [id number]  Subjective:   Patient ID: [name] male   DOB: [birth year] 67 y.o.   MRN: [id number]  HPI: [name] is a 67 y.o. male with history of nonobstructive CAD, combined systolic and diastolic CHF, poorly controlled T2DM, myasthenia gravis and poor medication compliance presenting for follow up of chronic diastolic HF  Per chart review, patient was supposed to be taking torsemide 60 BID with metolazone 5mg  twice weekly. He reports being told to stop metolazone and take torsemide 80mg  BID by the HF clinic here. There is not documentation of that conversation, and in fact phone note from HF clinic describes telling patient to continue torsemide and metolazone. Weight continues to increase. Weight today is 196 lbs, last weight about 2 weeks ago was 188lbs, up from baseline 175-180. LE edema increasing. Dyspnea and orthopnea increasing.   He has chronic hypontremia and last month completed course of tolvaptan as outpatient. Sodium range 120-128, most recent 123 two weeks prior. He continues to be dizzy which he attributes to his hyponatremia. He is scheduled to follow up with his [facility] cardiologist Dr. Elza Ward on June 16th.     Past Medical History  Diagnosis Date  . Sinus tachycardia 2006    a. Nonobstructive CAD by cath 2013. b. s/p atrial tachycardia ablation 08/2011 (sinus node modification)  . Orthostatic hypotension   . Unspecified essential hypertension   . Hyperlipidemia   . Cervical spondylosis with radiculopathy   . Syrinx     T5-T6  . Cord compression     Cord compression syndrome C5-C6, C6-C7  . ADD (attention deficit disorder)   . Decreased libido   . Depression   . Diabetes mellitus type 2, uncontrolled, with complications     CAD, orthostatic hypotension, erectile dysfunction  . Pulmonary nodule, left     Evaluated 04/2011 by pulmonology - felt to be benign granuloma  . Peripheral edema    . Coronary artery disease, non-occlusive     Mild nonobstructive CAD by cath 08/2011  . Systolic congestive heart failure     By echo 2013 at [facility] - reported EF 45% per pt  . Colitis     with surface exudate  . Hemorrhoids     small  . Gastritis   . Clostridium difficile infection 02/15/2012    causing pseudomembranosu colitis  . Diastolic heart failure     grade 2 per echocardiogram (2011)  . Right bundle branch block and left posterior fascicular block   . Sleep apnea     occassionally uses cpap  . 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 aspirin EC 81 MG EC tablet Take 1 tablet (81 mg total) by mouth daily.  30 tablet  11  . benazepril (LOTENSIN) 20 MG tablet Take 1 tablet (20 mg total) by mouth daily.  30 tablet  2  . doxycycline (VIBRA-TABS) 100 MG tablet Take 1 tablet (100 mg total) by mouth 2 (two) times daily.  20 tablet  0  . metolazone (ZAROXOLYN) 5 MG tablet Take 1 tablet (5 mg total) by mouth daily.  30 tablet  3  . mirtazapine (REMERON) 30 MG tablet Take 1 tablet (30 mg total) by mouth at bedtime.  30 tablet  1  . mupirocin ointment (BACTROBAN) 2 % Apply topically 3 (three)  times daily.  22 g  0  . mycophenolate (CELLCEPT) 500 MG tablet Take 1,000 mg by mouth 2 (two) times daily.       Marland Kitchen oxyCODONE (OXY IR/ROXICODONE) 5 MG immediate release tablet Take 1 tablet (5 mg total) by mouth every 4 (four) hours as needed. For pain  120 tablet  0  . pantoprazole (PROTONIX) 40 MG tablet Take 1 tablet (40 mg total) by mouth daily.  30 tablet  6  . predniSONE (DELTASONE) 5 MG tablet Take 15 mg by mouth daily.      Marland Kitchen pyridostigmine (MESTINON) 60 MG tablet Take 60 mg by mouth 5 (five) times daily. Medication starts to wear off closer to 2.5 hours      . tolvaptan (SAMSCA) 15 MG TABS Take 1 tablet (15 mg total) by mouth daily.  10 tablet  0  . torsemide (DEMADEX) 20 MG tablet Take 4  tablets (80 mg total) by mouth 2 (two) times daily.  240 tablet  1  . zolpidem (AMBIEN) 10 MG tablet Take 5 mg by mouth at bedtime.      . [DISCONTINUED] pantoprazole (PROTONIX) 40 MG tablet Take 1 tablet (40 mg total) by mouth daily.  30 tablet  2   No current facility-administered medications for this visit.   Family History  Problem Relation Age of Onset  . Breast cancer Mother   . Other Father     Beck's Sarcoid  . Colon cancer Neg Hx   . Prostate cancer Neg Hx    History   Social History  . Marital Status: Single    Spouse Name: N/A    Number of Children: 1  . Years of Education: N/A   Occupational History  . Retired     Surveyor, minerals   Social History Main Topics  . Smoking status: Former Smoker -- 2.50 packs/day for 30 years    Quit date: 06/09/1991  . Smokeless tobacco: Never Used  . Alcohol Use: No  . Drug Use: No  . Sexually Active: None   Other Topics Concern  . None   Social History Narrative   Has a single man's lifestyle and will remind anyone of it when asked.   Prefers to see only male physicians   Retired Surveyor, minerals      Lives at home with mother (67 y.o)   Has 1 son   Review of Systems: 10 pt ROS performed, pertinent positives and negatives noted in HPI Objective:  Physical Exam: Filed Vitals:   11/16/12 0850  BP: 141/73  Pulse: 86  Temp: 97.5 F (36.4 C)  TempSrc: Oral  Height: 5\' 7"  (1.702 m)  Weight: 196 lb 6.4 oz (89.086 kg)  SpO2: 97%   Vitals reviewed.  General: standing and leaning on counter  Cardiac: RRR, no rubs, murmurs or gallops. Difficult to assess JVD as pt refuses to lie down. Pulm: clear to auscultation bilaterally, no rales  Abd: soft, nontender, nondistended, BS present  Ext: chronic venous stasis changes and 2-3+ pitting edema of bilateral LE up to the knee. Crusting,healing erosions present over medial L lower leg.  No active oozing or blisters. No abscess or fluctuance.  Neuro: alert and oriented X3, cranial nerves  II-XII grossly intact   Assessment & Plan:   Please see problem-based charting for assessment and plan.

## 2012-11-16 NOTE — Progress Notes (Signed)
Lab results faxed to Dr Clarisa Kindred, [facility]  [phone]  11-16-2012 1330p   Called Misty Stanley and confirmed fax was received.  Alric Quan, PBT

## 2012-11-17 ENCOUNTER — Telehealth (HOSPITAL_COMMUNITY): Payer: Self-pay | Admitting: *Deleted

## 2012-11-17 ENCOUNTER — Telehealth: Payer: Self-pay | Admitting: *Deleted

## 2012-11-17 MED ORDER — TOLVAPTAN 15 MG PO TABS: 15.0000 mg | ORAL_TABLET | ORAL | Status: DC

## 2012-11-17 MED ORDER — TOLVAPTAN 15 MG PO TABS: 15.0000 mg | ORAL_TABLET | ORAL | Status: AC

## 2012-11-17 NOTE — Telephone Encounter (Signed)
Pt called to report that he is feeling dizzy and his NA is low, he saw Internal Med yesterday and wt is up about 15 lb, NA is 120 discussed w/Dr Bensimhon and he has reviewed chart, he would like for pt to take Tolvaptan 15 mg for 3 days, pt is aware and agreeable, he is aware to continue Torsemide and not take Metolazone when he takes the Tolvaptan, rx sent in and pt will pick up today, he will call me back on update with how he is doing.

## 2012-11-17 NOTE — Telephone Encounter (Signed)
[facility] does not have med and can not get it until Monday, they have called about 20 different pharmacies in [place] and no one has medication in stock.  Spoke w/our pharmacist Misty Stanley she has arranged for the hospital pharmacy to sell medication to our outpatient pharmacy and pt can pick up there, pt aware

## 2012-11-17 NOTE — Telephone Encounter (Signed)
Pt calls and states he

## 2012-11-17 NOTE — Addendum Note (Signed)
Addended by: Noralee Space on: 11/17/2012 03:17 PM   Modules accepted: Orders

## 2012-11-21 ENCOUNTER — Telehealth (HOSPITAL_COMMUNITY): Payer: Self-pay | Admitting: *Deleted

## 2012-11-21 NOTE — Telephone Encounter (Signed)
Pt called this AM to report he was still feeling dizzy and wt is up to 195 today, it was 191 on Fri, 5/2 on discharge wt was 189, discussed with Dr Gala Romney he would like pt to take metolazone today and keep his f/u appt with [facility] on 6/19 pt is aware and agreeable

## 2012-11-22 NOTE — Progress Notes (Signed)
TEACHING ATTENDING ADDENDUM: I discussed this case with Dr. Zeimer at the time of the patient visit. I agree with the HPI, exam findings and have read the documentation provided by the resident,  and I concur with the plan of care. Please see the resident note for details of management.  

## 2012-11-23 LAB — GLUCOSE, CAPILLARY

## 2012-11-25 ENCOUNTER — Telehealth: Payer: Self-pay | Admitting: *Deleted

## 2012-11-25 NOTE — Telephone Encounter (Signed)
I agree with the recommendation that patient should be seen today in the ED.

## 2012-11-25 NOTE — Telephone Encounter (Signed)
Finally spoke to pt via ph, he has just called EMS due to his mother's urging for transport to ED, he HAS STATED HE WILL BE BRINGING A SECRET AMOUNT OF MESTINON WITH HIM SO HE MAY DOSE HIMSELF he states this because he says 'THEY WONT LISTEN TO WHAT I SAY"

## 2012-11-25 NOTE — Telephone Encounter (Signed)
Appreciate the heads up.  I will forward this to [facility] since she will likely be the one who admits him if he requires admission.

## 2012-11-25 NOTE — Telephone Encounter (Signed)
Pt calls and states "i took a nasty fall last night and got banged up pretty bad, can i come in there and i need blood work about this sodium and potassium done too..yesterday, i should have gone to the hospital last night but i didn't and i do need to see someone but only if i can get this blood work done" i explained to pt that he first needed to be evaluated for the fall and then what possibly contributed to the fall, that he needs to call 911 or his son and come to ED now. He refuses. States he can barely walk but he is going to get something to eat because his sugar is below 300 and then he will decide what to do, he is cautioned greatly that he needs to come to ED immediately and not to drive self because if he is as weak and dizzy as he says he could injure someone by having a traffic accident. He still refuses and hangs up

## 2012-11-25 NOTE — Telephone Encounter (Signed)
i have tried calling pt twice since returning from lunch and speaking w/ dr Meredith Pel, ph has been busy both times

## 2012-11-26 ENCOUNTER — Telehealth: Payer: Self-pay | Admitting: Internal Medicine

## 2012-11-26 NOTE — Telephone Encounter (Signed)
Patient called in the AM, reporting that he has dizziness. Patient has chronic dizziness. He said that his dizziness has been worsening in the past 2 days. He did not have fall or any injury. No fever or chill. No SOB or chest pain. He asks what is his Na level in the recent BMP. I told him that his Na level was 120 on 11/16/12. He agrees that his Na is near his baseline, therefore he is less concerned. I adviced patient that he should come to ED for further evaluation if his dizziness does not improve or get worse. He asked me for ED telephone number. I provided him with hospital operator number and told him to ask for connection to ED after dial through the number. He verbally understood and agreed to do so.  Lorretta Harp, MD PGY2, Internal Medicine Teaching Service Pager: [phone]

## 2012-11-29 ENCOUNTER — Emergency Department (HOSPITAL_COMMUNITY): Payer: Medicare Other

## 2012-11-29 ENCOUNTER — Encounter (HOSPITAL_COMMUNITY): Payer: Self-pay

## 2012-11-29 ENCOUNTER — Inpatient Hospital Stay (HOSPITAL_COMMUNITY)
Admission: EM | Admit: 2012-11-29 | Discharge: 2012-12-01 | DRG: 640 | Disposition: A | Discharge status: Home / Self Care | Payer: Medicare Other | Attending: Internal Medicine | Admitting: Internal Medicine

## 2012-11-29 DIAGNOSIS — Z87891 Personal history of nicotine dependence: Secondary | ICD-10-CM

## 2012-11-29 DIAGNOSIS — I4891 Unspecified atrial fibrillation: Secondary | ICD-10-CM | POA: Diagnosis present

## 2012-11-29 DIAGNOSIS — J4489 Other specified chronic obstructive pulmonary disease: Secondary | ICD-10-CM | POA: Diagnosis present

## 2012-11-29 DIAGNOSIS — IMO0001 Reserved for inherently not codable concepts without codable children: Secondary | ICD-10-CM | POA: Diagnosis present

## 2012-11-29 DIAGNOSIS — Z8673 Personal history of transient ischemic attack (TIA), and cerebral infarction without residual deficits: Secondary | ICD-10-CM

## 2012-11-29 DIAGNOSIS — G4730 Sleep apnea, unspecified: Secondary | ICD-10-CM | POA: Diagnosis present

## 2012-11-29 DIAGNOSIS — I129 Hypertensive chronic kidney disease with stage 1 through stage 4 chronic kidney disease, or unspecified chronic kidney disease: Secondary | ICD-10-CM | POA: Diagnosis present

## 2012-11-29 DIAGNOSIS — G9340 Encephalopathy, unspecified: Secondary | ICD-10-CM | POA: Diagnosis present

## 2012-11-29 DIAGNOSIS — J449 Chronic obstructive pulmonary disease, unspecified: Secondary | ICD-10-CM | POA: Diagnosis present

## 2012-11-29 DIAGNOSIS — G7000 Myasthenia gravis without (acute) exacerbation: Secondary | ICD-10-CM | POA: Diagnosis present

## 2012-11-29 DIAGNOSIS — Z7982 Long term (current) use of aspirin: Secondary | ICD-10-CM

## 2012-11-29 DIAGNOSIS — N183 Chronic kidney disease, stage 3 unspecified: Secondary | ICD-10-CM | POA: Diagnosis present

## 2012-11-29 DIAGNOSIS — F329 Major depressive disorder, single episode, unspecified: Secondary | ICD-10-CM | POA: Diagnosis present

## 2012-11-29 DIAGNOSIS — Z79899 Other long term (current) drug therapy: Secondary | ICD-10-CM

## 2012-11-29 DIAGNOSIS — I498 Other specified cardiac arrhythmias: Secondary | ICD-10-CM | POA: Diagnosis present

## 2012-11-29 DIAGNOSIS — E1165 Type 2 diabetes mellitus with hyperglycemia: Secondary | ICD-10-CM | POA: Diagnosis present

## 2012-11-29 DIAGNOSIS — IMO0002 Reserved for concepts with insufficient information to code with codable children: Secondary | ICD-10-CM | POA: Diagnosis present

## 2012-11-29 DIAGNOSIS — F3289 Other specified depressive episodes: Secondary | ICD-10-CM | POA: Diagnosis present

## 2012-11-29 DIAGNOSIS — F0390 Unspecified dementia without behavioral disturbance: Secondary | ICD-10-CM | POA: Diagnosis present

## 2012-11-29 DIAGNOSIS — R269 Unspecified abnormalities of gait and mobility: Secondary | ICD-10-CM | POA: Diagnosis present

## 2012-11-29 DIAGNOSIS — I5032 Chronic diastolic (congestive) heart failure: Secondary | ICD-10-CM | POA: Diagnosis present

## 2012-11-29 DIAGNOSIS — E871 Hypo-osmolality and hyponatremia: Principal | ICD-10-CM | POA: Diagnosis present

## 2012-11-29 DIAGNOSIS — E785 Hyperlipidemia, unspecified: Secondary | ICD-10-CM | POA: Diagnosis present

## 2012-11-29 DIAGNOSIS — I509 Heart failure, unspecified: Secondary | ICD-10-CM | POA: Diagnosis present

## 2012-11-29 LAB — PRO B NATRIURETIC PEPTIDE: Pro B Natriuretic peptide (BNP): 6687 pg/mL — ABNORMAL HIGH (ref 0–125)

## 2012-11-29 LAB — OSMOLALITY, URINE: Osmolality, Ur: 136 mosm/kg — ABNORMAL LOW (ref 390–1090)

## 2012-11-29 LAB — URINALYSIS, ROUTINE W REFLEX MICROSCOPIC
Bilirubin Urine: NEGATIVE
Leukocytes, UA: NEGATIVE
Nitrite: NEGATIVE
Specific Gravity, Urine: 1.013 (ref 1.005–1.030)
Urobilinogen, UA: 0.2 mg/dL (ref 0.0–1.0)

## 2012-11-29 LAB — CBC
Hemoglobin: 12.1 g/dL — ABNORMAL LOW (ref 13.0–17.0)
MCH: 27.1 pg (ref 26.0–34.0)
MCHC: 36.2 g/dL — ABNORMAL HIGH (ref 30.0–36.0)
MCV: 74.9 fL — ABNORMAL LOW (ref 78.0–100.0)
RBC: 4.46 MIL/uL (ref 4.22–5.81)

## 2012-11-29 LAB — COMPREHENSIVE METABOLIC PANEL
CO2: 26 mEq/L (ref 19–32)
Calcium: 9.6 mg/dL (ref 8.4–10.5)
Creatinine, Ser: 1.29 mg/dL (ref 0.50–1.35)
GFR calc Af Amer: 64 mL/min — ABNORMAL LOW (ref >90)
GFR calc non Af Amer: 55 mL/min — ABNORMAL LOW (ref >90)
Glucose, Bld: 102 mg/dL — ABNORMAL HIGH (ref 70–99)

## 2012-11-29 LAB — BASIC METABOLIC PANEL
BUN: 30 mg/dL — ABNORMAL HIGH (ref 6–23)
Creatinine, Ser: 1.06 mg/dL (ref 0.50–1.35)
GFR calc Af Amer: 81 mL/min — ABNORMAL LOW (ref >90)
GFR calc non Af Amer: 70 mL/min — ABNORMAL LOW (ref >90)
Glucose, Bld: 132 mg/dL — ABNORMAL HIGH (ref 70–99)

## 2012-11-29 LAB — POCT I-STAT, CHEM 8
Creatinine, Ser: 1.6 mg/dL — ABNORMAL HIGH (ref 0.50–1.35)
HCT: 38 % — ABNORMAL LOW (ref 39.0–52.0)
Hemoglobin: 12.9 g/dL — ABNORMAL LOW (ref 13.0–17.0)
Potassium: 4.2 mEq/L (ref 3.5–5.1)
Sodium: 117 mEq/L — CL (ref 135–145)
TCO2: 26 mmol/L (ref 0–100)

## 2012-11-29 LAB — URINE MICROSCOPIC-ADD ON

## 2012-11-29 LAB — MRSA PCR SCREENING: MRSA by PCR: NEGATIVE

## 2012-11-29 LAB — CG4 I-STAT (LACTIC ACID): Lactic Acid, Venous: 0.84 mmol/L (ref 0.5–2.2)

## 2012-11-29 LAB — ETHANOL: Alcohol, Ethyl (B): <11 mg/dL (ref 0–11)

## 2012-11-29 LAB — CREATININE, URINE, RANDOM: Creatinine, Urine: 29.05 mg/dL

## 2012-11-29 LAB — RAPID URINE DRUG SCREEN, HOSP PERFORMED: Amphetamines: NOT DETECTED

## 2012-11-29 MED ORDER — TORSEMIDE 20 MG PO TABS
80.0000 mg | ORAL_TABLET | Freq: Two times a day (BID) | ORAL | Status: DC
  Administered 2012-11-29 – 2012-12-01 (×4): 80 mg via ORAL
  Filled 2012-11-29 (×6): qty 4

## 2012-11-29 MED ORDER — ACETAMINOPHEN 325 MG PO TABS: 650.0000 mg | ORAL_TABLET | Freq: Four times a day (QID) | ORAL | Status: DC | PRN

## 2012-11-29 MED ORDER — ASPIRIN EC 81 MG PO TBEC
81.0000 mg | DELAYED_RELEASE_TABLET | Freq: Every day | ORAL | Status: DC
  Administered 2012-11-29 – 2012-12-01 (×2): 81 mg via ORAL
  Filled 2012-11-29 (×3): qty 1

## 2012-11-29 MED ORDER — INSULIN ASPART 100 UNIT/ML ~~LOC~~ SOLN
0.0000 [IU] | Freq: Three times a day (TID) | SUBCUTANEOUS | Status: DC
  Administered 2012-11-30 – 2012-12-01 (×2): 1 [IU] via SUBCUTANEOUS

## 2012-11-29 MED ORDER — LORAZEPAM 2 MG/ML IJ SOLN
1.0000 mg | INTRAMUSCULAR | Status: DC | PRN
  Administered 2012-11-29 – 2012-11-30 (×2): 1 mg via INTRAVENOUS
  Filled 2012-11-29 (×2): qty 1

## 2012-11-29 MED ORDER — MYCOPHENOLATE MOFETIL 250 MG PO CAPS
1000.0000 mg | ORAL_CAPSULE | Freq: Two times a day (BID) | ORAL | Status: DC
  Administered 2012-11-29 – 2012-12-01 (×3): 1000 mg via ORAL
  Filled 2012-11-29 (×5): qty 4

## 2012-11-29 MED ORDER — ALBUTEROL SULFATE HFA 108 (90 BASE) MCG/ACT IN AERS: 1.0000 | INHALATION_SPRAY | Freq: Four times a day (QID) | RESPIRATORY_TRACT | Status: DC | PRN

## 2012-11-29 MED ORDER — LORAZEPAM 2 MG/ML IJ SOLN: 2.0000 mg | Freq: Once | INTRAMUSCULAR | Status: AC

## 2012-11-29 MED ORDER — SODIUM CHLORIDE 0.9 % IJ SOLN
3.0000 mL | Freq: Two times a day (BID) | INTRAMUSCULAR | Status: DC
  Administered 2012-11-29: 3 mL via INTRAVENOUS
  Administered 2012-11-30: 22:00:00 via INTRAVENOUS
  Administered 2012-12-01: 3 mL via INTRAVENOUS

## 2012-11-29 MED ORDER — LORAZEPAM 2 MG/ML IJ SOLN
INTRAMUSCULAR | Status: AC
  Administered 2012-11-29: 2 mg via INTRAVENOUS
  Filled 2012-11-29: qty 1

## 2012-11-29 MED ORDER — SODIUM CHLORIDE 0.9 % IV SOLN
INTRAVENOUS | Status: DC
  Administered 2012-11-29: 16:00:00 via INTRAVENOUS

## 2012-11-29 MED ORDER — PYRIDOSTIGMINE BROMIDE 60 MG PO TABS
60.0000 mg | ORAL_TABLET | Freq: Every day | ORAL | Status: DC
  Administered 2012-11-29 – 2012-12-01 (×9): 60 mg via ORAL
  Filled 2012-11-29 (×13): qty 1

## 2012-11-29 MED ORDER — BENAZEPRIL HCL 20 MG PO TABS
20.0000 mg | ORAL_TABLET | Freq: Every day | ORAL | Status: DC
  Administered 2012-11-29 – 2012-12-01 (×2): 20 mg via ORAL
  Filled 2012-11-29 (×3): qty 1

## 2012-11-29 MED ORDER — HEPARIN SODIUM (PORCINE) 5000 UNIT/ML IJ SOLN
5000.0000 [IU] | Freq: Three times a day (TID) | INTRAMUSCULAR | Status: DC
  Administered 2012-11-29 – 2012-11-30 (×2): 5000 [IU] via SUBCUTANEOUS
  Filled 2012-11-29 (×8): qty 1

## 2012-11-29 MED ORDER — PANTOPRAZOLE SODIUM 40 MG PO TBEC
40.0000 mg | DELAYED_RELEASE_TABLET | Freq: Every day | ORAL | Status: DC
  Administered 2012-11-29 – 2012-12-01 (×2): 40 mg via ORAL
  Filled 2012-11-29 (×3): qty 1

## 2012-11-29 MED ORDER — ACETAMINOPHEN 650 MG RE SUPP: 650.0000 mg | Freq: Four times a day (QID) | RECTAL | Status: DC | PRN

## 2012-11-29 MED ORDER — MYCOPHENOLATE MOFETIL 500 MG PO TABS: 1000.0000 mg | ORAL_TABLET | Freq: Two times a day (BID) | ORAL | Status: DC

## 2012-11-29 MED ORDER — TOLVAPTAN 15 MG PO TABS
15.0000 mg | ORAL_TABLET | ORAL | Status: DC
  Administered 2012-11-29 – 2012-11-30 (×2): 15 mg via ORAL
  Filled 2012-11-29 (×3): qty 1

## 2012-11-29 MED ORDER — PREDNISONE 5 MG PO TABS
15.0000 mg | ORAL_TABLET | Freq: Every day | ORAL | Status: DC
  Administered 2012-11-29 – 2012-12-01 (×2): 15 mg via ORAL
  Filled 2012-11-29 (×3): qty 1

## 2012-11-29 NOTE — ED Notes (Signed)
Transported to and from CT with sitter.  Admitting MDs at bedside.

## 2012-11-29 NOTE — ED Notes (Signed)
MD and Safety sitter to bedside.

## 2012-11-29 NOTE — Progress Notes (Signed)
At shift change pt was increasingly agitated, confused, and attempting to get out of bed. Pt refused redirection, and remained non-compliant after 10 minutes of therapeutic communication. Posey belt was applied in addition to bilateral wrist restraints and MD was called to have restraint order modified. Discontinuation criteria discussed with pt and despite his acknowledgement of understanding, learning highly doubted.

## 2012-11-29 NOTE — H&P (Signed)
Date: 11/29/2012               Patient Name:  [name] MRN: [id number]  DOB: 20-Feb-1946 Age / Sex: 67 y.o., male   PCP: Christen Bame, MD         Medical Service: Internal Medicine Teaching Service         Attending Physician: Dr. Aletta Edouard, MD    First Contact: Dr. Garald Braver Pager: [phone]  Second Contact: Dr. Dorise Hiss Pager: [phone]       After Hours (After 5p/  First Contact Pager: [phone]  weekends / holidays): Second Contact Pager: [phone]   Chief Complaint: confusion and bizarre behavior  History of Present Illness:  The patient is a 67 YO male with complex past medical history including poorly controlled diabetes mellitus type II, CHF with last EF 45%, chronic hyponatremia, CKD with baseline creatinine of 1.3, and myasthenia gravis. He is coming in today because he was acting confused and strange at a diner and the employees called 911 for him. He had left the diner by the time the police arrived and therefore they went to his house and brought him into the hospital. He was initially belligerant and not cooperative in the ED however after 2 mg of ativan we were able to talk to him. He states that he has been more confused lately with interruptions in his thought process. He denies any SOB or chest pain. He denies nausea or vomiting. He denies abdominal pain, diarrhea, constipation. He states that he occasionally falls but not that recently. He is also having some weeping from his lower legs but not different than usual. He is not having fevers or chills at home. He does live alone.   Meds:  Current Outpatient Prescriptions  Medication Sig Dispense Refill  . aspirin EC 81 MG EC tablet Take 1 tablet (81 mg total) by mouth daily.  30 tablet  11  . benazepril (LOTENSIN) 20 MG tablet Take 1 tablet (20 mg total) by mouth daily.  30 tablet  2  . doxycycline (VIBRA-TABS) 100 MG tablet Take 1 tablet (100 mg total) by mouth 2 (two) times daily.  20 tablet  0  . metolazone  (ZAROXOLYN) 5 MG tablet Take 1 tablet (5 mg total) by mouth daily.  30 tablet  3  . mirtazapine (REMERON) 30 MG tablet Take 1 tablet (30 mg total) by mouth at bedtime.  30 tablet  1  . mupirocin ointment (BACTROBAN) 2 % Apply topically 3 (three) times daily.  22 g  0  . mycophenolate (CELLCEPT) 500 MG tablet Take 1,000 mg by mouth 2 (two) times daily.       Marland Kitchen oxyCODONE (OXY IR/ROXICODONE) 5 MG immediate release tablet Take 1 tablet (5 mg total) by mouth every 4 (four) hours as needed. For pain  120 tablet  0  . pantoprazole (PROTONIX) 40 MG tablet Take 1 tablet (40 mg total) by mouth daily.  30 tablet  6  . predniSONE (DELTASONE) 5 MG tablet Take 15 mg by mouth daily.      Marland Kitchen pyridostigmine (MESTINON) 60 MG tablet Take 60 mg by mouth 5 (five) times daily. Medication starts to wear off closer to 2.5 hours      . tolvaptan (SAMSCA) 15 MG TABS Take 1 tablet (15 mg total) by mouth daily.  3 tablet  0  . torsemide (DEMADEX) 20 MG tablet Take 4 tablets (80 mg total) by mouth 2 (two) times daily.  240 tablet  1  . zolpidem (AMBIEN) 10 MG tablet Take 5 mg by mouth at bedtime.      . [DISCONTINUED] pantoprazole (PROTONIX) 40 MG tablet Take 1 tablet (40 mg total) by mouth daily.  30 tablet  2    Allergies: Allergies as of 11/29/2012 - Review Complete 11/29/2012  Allergen Reaction Noted  . Aminoglycosides Other (See Comments) 04/14/2011  . Beta adrenergic blockers Other (See Comments)   . Calcium channel blockers Other (See Comments)   . Metoprolol Other (See Comments) 09/12/2011  . Neuromuscular blocking agents Other (See Comments) 04/14/2011  . Other Other (See Comments) 09/12/2011  . Penicillins Other (See Comments) 09/12/2011  . Quinine derivatives Other (See Comments) 09/12/2011   Past Medical History  Diagnosis Date  . Sinus tachycardia 2006    a. Nonobstructive CAD by cath 2013. b. s/p atrial tachycardia ablation 08/2011 (sinus node modification)  . Orthostatic hypotension   . Unspecified  essential hypertension   . Hyperlipidemia   . Cervical spondylosis with radiculopathy   . Syrinx     T5-T6  . Cord compression     Cord compression syndrome C5-C6, C6-C7  . ADD (attention deficit disorder)   . Decreased libido   . Depression   . Diabetes mellitus type 2, uncontrolled, with complications     CAD, orthostatic hypotension, erectile dysfunction  . Pulmonary nodule, left     Evaluated 04/2011 by pulmonology - felt to be benign granuloma  . Peripheral edema   . Coronary artery disease, non-occlusive     Mild nonobstructive CAD by cath 08/2011  . Systolic congestive heart failure     By echo 2013 at [facility] - reported EF 45% per pt  . Colitis     with surface exudate  . Hemorrhoids     small  . Gastritis   . Clostridium difficile infection 02/15/2012    causing pseudomembranosu colitis  . Diastolic heart failure     grade 2 per echocardiogram (2011)  . Right bundle branch block and left posterior fascicular block   . Sleep apnea     occassionally uses cpap  . 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 Thymectomy  april 2012  . Colonoscopy  11/13/2011    Procedure: COLONOSCOPY;  Surgeon: Theda Belfast, MD;  Location: [facility] ENDOSCOPY;  Service: Endoscopy;  Laterality: N/A;  . Cardiac catheterization    . Esophagogastroduodenoscopy  05/13/2012    Procedure: ESOPHAGOGASTRODUODENOSCOPY (EGD);  Surgeon: Iva Boop, MD;  Location: [facility];  Service: Endoscopy;  Laterality: N/A;  needs PAT w/ dx of Myastenia gravis and multiple meds. Pt requested lidocaine to numb IV site prior to start.  Very needle phobic.   Family History  Problem Relation Age of Onset  . Breast cancer Mother   . Other Father     Beck's Sarcoid  . Colon cancer Neg Hx   . Prostate cancer Neg Hx    History   Social History  . Marital Status: Single    Spouse Name: N/A    Number of Children: 1  . Years of  Education: N/A   Occupational History  . Retired     Surveyor, minerals   Social History Main Topics  . Smoking status: Former Smoker -- 2.50 packs/day for 30 years    Quit date: 06/09/1991  . Smokeless tobacco: Never Used  . Alcohol Use: No  . Drug Use: No  .  Sexually Active: Not on file   Other Topics Concern  . Not on file   Social History Narrative   Has a single man's lifestyle and will remind anyone of it when asked.   Prefers to see only male physicians   Retired Surveyor, minerals      Lives at home with mother (82 y.o)   Has 1 son    Review of Systems: 10 point review of systems was performed and pertinent positives and negatives listed in HPI otherwise negative.  Physical Exam: Blood pressure 164/84, pulse 112, temperature 98.9 F (37.2 C), temperature source Rectal, resp. rate 16, SpO2 99.00%. General: resting in bed, not very cooperative, in no acute distress, sleepy HEENT: PERRL, EOMI, no scleral icterus Cardiac: tachycardia, JVP noted but not elevated right above the clavicles, S1 S2 heard, no distinct murmur Pulm: good air flow bilaterally, no overt rales or crackles Abd: soft, nontender, nondistended, BS present Ext: warm and well perfused, 2+ pedal edema to mid shins, weeping with dangling feet over side of bed Neuro: alert and oriented X2 (not to time), no tactile sensation disturbances, no numbness upper or lower extremities bilaterally, 4/5 strength upper and lower extremities bilaterally, CN II-XII grossly intact   Lab results: Basic Metabolic Panel:  Recent Labs  40/98/11 1444 11/29/12 1445  NA 117* 117*  K 4.2 4.2  CL 86* 78*  CO2  --  26  GLUCOSE 101* 102*  BUN 34* 32*  CREATININE 1.60* 1.29  CALCIUM  --  9.6   Liver Function Tests:  Recent Labs  11/29/12 1445  AST 37  ALT 16  ALKPHOS 100  BILITOT 0.9  PROT 7.0  ALBUMIN 3.8   CBC:  Recent Labs  11/29/12 1444 11/29/12 1445  WBC  --  7.5  HGB 12.9* 12.1*  HCT 38.0* 33.4*  MCV  --   74.9*  PLT  --  395   Alcohol Level:  Recent Labs  11/29/12 1445  ETH <11   Urinalysis:  Recent Labs  11/29/12 1359  COLORURINE YELLOW  LABSPEC 1.013  PHURINE 5.5  GLUCOSEU NEGATIVE  HGBUR SMALL*  BILIRUBINUR NEGATIVE  KETONESUR NEGATIVE  PROTEINUR >300*  UROBILINOGEN 0.2  NITRITE NEGATIVE  LEUKOCYTESUR NEGATIVE   Imaging results:  Ct Head Wo Contrast  11/29/2012   *RADIOLOGY REPORT*  Clinical Data: Mental status changes  CT HEAD WITHOUT CONTRAST  Technique:  Contiguous axial images were obtained from the base of the skull through the vertex without contrast.  Comparison: 05/27/2010  Findings: Global atrophy. 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  No mass effect, midline shift, or acute intracranial hemorrhage. Mastoid air cells and visualized paranasal sinuses are clear.  The cranium is intact.  IMPRESSION: No acute intracranial pathology.  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   Original Report Authenticated By: Jolaine Click, M.D.   Dg Chest Port 1 View  11/29/2012   *RADIOLOGY REPORT*  Clinical Data: Altered mental status.  Altered level of consciousness.  PORTABLE CHEST - 1 VIEW  Comparison: 10/03/2012.  Findings: The patient is rotated to the right.  Density with linear margin is present at the left lung base, favored to represent a skin fold.   Median sternotomy.  No focal consolidation.  No pleural effusion.  No convincing evidence of failure. Cardiopericardial silhouette is probably within normal limits allowing for rotation.  IMPRESSION: No gross acute cardiopulmonary disease.  Density at the left lung base favored to represent skin fold and overlapping soft tissue density.  If there is high clinical suspicion of pneumonia/airspace disease, consider PA and lateral chest.   Original Report Authenticated By: Andreas Newport, M.D.    Other results: EKG:  unchanged from previous tracings, sinus tachycardia, LBBB, RBBB, old ST changes and prolonged QT to 500.  Assessment & Plan by Problem:  Confusion - Biggest concern is for hyponatremia as the cause although infectious or toxic etiology remains on the differential. Urine specimen does not appear infected and his CXR was not acutely changed and he is without symptoms. He does not have any other focal infectious source in the abdomen. He does have some redness on his legs s/p fall some time ago and this does not appear to be cellulitic at this time. He denies alcohol or drug use however UDS still pending at time of admission. CT head ruled out hemorrhage in the brain, brain lesion. He does appear to have more atrophy than expected and a chronic infarct. Other causes that were excluded in the ED included hypoglycemia (sugar 101).  -Admit to stepdown -No indication for antibiotics at this time -Fluid restriction -Tolvaptan -Urine drug screen -Hold metalozone but continue torsemide  Hyponatremia - Appears to be acute on chronic and worsening sodium could be the cause of the confusion.He has been on tolvaptan in the past for this problem and we will resume it as well as fluid restrict him. We will check serum osmolality and urine osm and urine sodium and urine urea. We will monitor closely with BMP every 6 hours to monitor the rate of elevation of the serum sodium levels and will not exceed 10 /24 hours.  -See above for full details -BMP q 6 hours  Diabetes mellitus type II, uncontrolled - Sugar not acutely elevated on admission and will use SSI if the patient allows to control his sugars here. Last HgA1c around 9 but he adamantly refuses treatment for sugars below 300 (he feels he will be hypoglycemic below 300).   SINUS TACHYCARDIA - See above but may be due to acute stress from forced hospital visit and agitation. Will monitor closely.   Myasthenia gravis without exacerbation - Continue home  cellcept, mestinon. No concern for acute decompensation at this time. No respiratory distress and no problems managing secretions.   COPD, MILD - Not on home inhalers. No PFT on file.  -Albuterol inhaler q6h PRN  Chronic diastolic congestive heart failure - not in acute exacerbation but does seem to have some extra fluid in his legs. Will hold metalozone but continue torsemide as he has not been taking either of these medicines at home (per the patient).  CKD (chronic kidney disease), stage III - Stable creatinine of 1.29 today and will follow.   Abnormality of gait - The patient does fall quite a bit at home and this recent confusion and chronically low sodium levels likely contribute.  -PT/OT consult  DVT ppx - heparin [place] 5000 TID  Dispo: Disposition is deferred at this time, awaiting improvement of current medical problems. Anticipated discharge in approximately 1-2 day(s).   The patient does have a current PCP Christen Bame, MD) and does need an [facility] hospital follow-up appointment after discharge.  The patient does not have transportation limitations that hinder transportation to clinic appointments.  Signed: Judie Bonus, MD 11/29/2012, 4:21 PM

## 2012-11-29 NOTE — ED Notes (Signed)
Pt was brought in by [facility].  Per EMS, pt was at [facility] and they called police because pt was not acting right.  Police found pt at this home and did not want to go to hospital.  Pt is altered, unknown baseline.  Pt knows his name and that he is at [facility] but pt is unaware of date and why he is here.  Pt agitated with EMS and in treatment room.  Pt was seen four days ago for fall.

## 2012-11-29 NOTE — ED Notes (Signed)
Pt resting in stretcher, sitter at bedside.  Pt continues to refuse to let this RN take off pants.  MD Ray aware.

## 2012-11-29 NOTE — ED Notes (Signed)
Results of lactic acid and chem 8 shown to Dr. Patria Mane

## 2012-11-29 NOTE — ED Provider Notes (Addendum)
LEVEL 5 CAVEAT Patient confused and cannot give history History    CSN: [id number] Arrival date & time 11/29/12  1341  First MD Initiated Contact with Patient 11/29/12 1412     Chief Complaint  Patient presents with  . Altered Mental Status   (Consider location/radiation/quality/duration/timing/severity/associated sxs/prior Treatment) HPI Patient brought in by ems with report that he was at a diner and noted by staff to be confused.  Police called and went to his house.  EMS brought patient to ed but patient required restraints en route.  Patient denies any complaints.   Info from most recent discharge summary below.      Patient Name:   [name]  MRN:  [id number]   PCP:  Leodis Sias, MD  DOB:     [birth year]                   Date of Admission:   09/05/2012   Date of Discharge:   09/07/2012         Attending Physician:  Dr. Kem Kays                                                      DISCHARGE DIAGNOSES: 1. Acute exacerbation of chronic diastolic CHF 2. Hyponatremia 3. Acute kidney injury and stage III CKD 4. Chronic microcytic anemia 5. Type 2 diabetes mellitus, uncontrolled, with complications 6. Hypertension 7. Myasthenia gravis 8. Diabetic nephropathy   DISPOSITION AND FOLLOW-UP: [name] is to follow-up with the listed providers as detailed below, at which time, the following should be addressed:    1. Follow-up visits: 1. CHF - normal systolic function, grade 3 diastolic dysfunction; discharged on furosemide 80mg  TID, given explicit instructions to restrict sodium to 1500 mg daily 2. Diabetes/Nephropathy - urine protein to creatinine ratio is 2.56, discussed starting insulin with patient, he may be amenable now, also holding metformin 3. Hypertension - not on therapy currently, holding ACEi because of renal function    2. Labs and images needed: 1. BMET   3. Pending labs and tests needing follow-up:  NONE     Follow-up Information     Follow up  with PRIBULA,CHRISTOPHER, MD On 09/09/2012. (PRIMARY CARE - Your appointment is on Friday, April 4th at 2:45.)      Contact information:     [address]  [place] [address] [phone]       Past Medical History  Diagnosis Date  . Sinus tachycardia 2006    a. Nonobstructive CAD by cath 2013. b. s/p atrial tachycardia ablation 08/2011 (sinus node modification)  . Orthostatic hypotension   . Unspecified essential hypertension   . Hyperlipidemia   . Cervical spondylosis with radiculopathy   . Syrinx     T5-T6  . Cord compression     Cord compression syndrome C5-C6, C6-C7  . ADD (attention deficit disorder)   . Decreased libido   . Depression   . Diabetes mellitus type 2, uncontrolled, with complications     CAD, orthostatic hypotension, erectile dysfunction  . Pulmonary nodule, left     Evaluated 04/2011 by pulmonology - felt to be benign granuloma  . Peripheral edema   . Coronary artery disease, non-occlusive     Mild nonobstructive CAD by cath 08/2011  . Systolic congestive heart failure  By echo 2013 at [facility] - reported EF 45% per pt  . Colitis     with surface exudate  . Hemorrhoids     small  . Gastritis   . Clostridium difficile infection 02/15/2012    causing pseudomembranosu colitis  . Diastolic heart failure     grade 2 per echocardiogram (2011)  . Right bundle branch block and left posterior fascicular block   . Sleep apnea     occassionally uses cpap  . 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 Thymectomy  april 2012  . Colonoscopy  11/13/2011    Procedure: COLONOSCOPY;  Surgeon: Theda Belfast, MD;  Location: [facility] ENDOSCOPY;  Service: Endoscopy;  Laterality: N/A;  . Cardiac catheterization    . Esophagogastroduodenoscopy  05/13/2012    Procedure: ESOPHAGOGASTRODUODENOSCOPY (EGD);  Surgeon: Iva Boop, MD;  Location: [facility];  Service: Endoscopy;   Laterality: N/A;  needs PAT w/ dx of Myastenia gravis and multiple meds. Pt requested lidocaine to numb IV site prior to start.  Very needle phobic.   Family History  Problem Relation Age of Onset  . Breast cancer Mother   . Other Father     Beck's Sarcoid  . Colon cancer Neg Hx   . Prostate cancer Neg Hx    History  Substance Use Topics  . Smoking status: Former Smoker -- 2.50 packs/day for 30 years    Quit date: 06/09/1991  . Smokeless tobacco: Never Used  . Alcohol Use: No    Review of Systems  Unable to perform ROS   Allergies  Aminoglycosides; Beta adrenergic blockers; Calcium channel blockers; Metoprolol; Neuromuscular blocking agents; Other; Penicillins; and Quinine derivatives  Home Medications   Current Outpatient Rx  Name  Route  Sig  Dispense  Refill  . aspirin EC 81 MG EC tablet   Oral   Take 1 tablet (81 mg total) by mouth daily.   30 tablet   11   . benazepril (LOTENSIN) 20 MG tablet   Oral   Take 1 tablet (20 mg total) by mouth daily.   30 tablet   2   . doxycycline (VIBRA-TABS) 100 MG tablet   Oral   Take 1 tablet (100 mg total) by mouth 2 (two) times daily.   20 tablet   0   . metolazone (ZAROXOLYN) 5 MG tablet   Oral   Take 1 tablet (5 mg total) by mouth daily.   30 tablet   3   . mirtazapine (REMERON) 30 MG tablet   Oral   Take 1 tablet (30 mg total) by mouth at bedtime.   30 tablet   1   . mupirocin ointment (BACTROBAN) 2 %   Topical   Apply topically 3 (three) times daily.   22 g   0   . mycophenolate (CELLCEPT) 500 MG tablet   Oral   Take 1,000 mg by mouth 2 (two) times daily.          Marland Kitchen oxyCODONE (OXY IR/ROXICODONE) 5 MG immediate release tablet   Oral   Take 1 tablet (5 mg total) by mouth every 4 (four) hours as needed. For pain   120 tablet   0   . pantoprazole (PROTONIX) 40 MG tablet   Oral   Take 1 tablet (40 mg total) by mouth daily.   30 tablet   6   . predniSONE (DELTASONE)  5 MG tablet   Oral   Take 15  mg by mouth daily.         Marland Kitchen pyridostigmine (MESTINON) 60 MG tablet   Oral   Take 60 mg by mouth 5 (five) times daily. Medication starts to wear off closer to 2.5 hours         . tolvaptan (SAMSCA) 15 MG TABS   Oral   Take 1 tablet (15 mg total) by mouth daily.   3 tablet   0     Generic ok   . torsemide (DEMADEX) 20 MG tablet   Oral   Take 4 tablets (80 mg total) by mouth 2 (two) times daily.   240 tablet   1   . zolpidem (AMBIEN) 10 MG tablet   Oral   Take 5 mg by mouth at bedtime.          BP 154/88  Temp(Src) 98.3 F (36.8 C) (Oral)  Resp 18  SpO2 100% Physical Exam  Vitals reviewed. Constitutional: He appears well-developed and well-nourished.  HENT:  Head: Normocephalic and atraumatic.  Right Ear: External ear normal.  Left Ear: External ear normal.  Nose: Nose normal.  Mucous membranes appear dry.   Eyes: EOM are normal. Pupils are equal, round, and reactive to light.  Neck: Normal range of motion.  Cardiovascular: Normal rate, regular rhythm and normal heart sounds.   Pulmonary/Chest: Effort normal and breath sounds normal.  Abdominal: Soft. Bowel sounds are normal.  Musculoskeletal:  Patient has weeping wounds bilateral lower extremities but refuses to take his pants or shoes off for further exam.   Neurological: He is alert.  Patient oriented to place but unable to state date or year.  Appears to move all extremities equally swinging legs off bed and taking a few steps- his gate appeared off balance but quickly returned to stretcher and not ambulated more than 2-3 steps.   Skin: Skin is warm and dry.  Psychiatric: His mood appears anxious. His speech is tangential. He is agitated. Cognition and memory are impaired. He expresses inappropriate judgment.    ED Course  Procedures (including critical care time) Labs Reviewed  CBC  COMPREHENSIVE METABOLIC PANEL  URINALYSIS, ROUTINE W REFLEX MICROSCOPIC  URINE RAPID DRUG SCREEN (HOSP PERFORMED)   ETHANOL   No results found. No diagnosis found.  Date: 11/29/2012  Rate: 85  Rhythm: not determined  QRS Axis: normal  Intervals: atrial fib  ST/T Wave abnormalities: nonspecific ST changes  Conduction Disutrbances:right bundle branch block and left posterior fascicular block  Narrative Interpretation:   Old EKG Reviewed: 4/14,    MDM  Patient having iv started and given ativan 2 mg.  He has had multiple contacts noted with the health system through the past several months.  Notably he called and complained of dizziness and on 6/20 was given his sodium results of 120 which were stated to be normal for him, he also told his physician that he was coming to the ed on 6/20 after this phone encounter  "Pt calls and states "i took a nasty fall last night and got banged up pretty bad, can i come in there and i need blood work about this sodium and potassium done too..yesterday, i should have gone to the hospital last night but i didn't and i do need to see someone but only if i can get this blood work done" i explained to pt that he first needed to be evaluated for the fall and then what  possibly contributed to the fall, that he needs to call 911 or his son and come to ED now. He refuses. States he can barely walk but he is going to get something to eat because his sugar is below 300 and then he will decide what to do, he is cautioned greatly that he needs to come to ED immediately and not to drive self because if he is as weak and dizzy as he says he could injure someone by having a traffic accident. He still refuses and hangs up"  There appears to have been no contact from 6/21 until today.  DDX- Metabolic abnormality- electrolytes and glucose pending. Intracranial abnormality- patient had reported fall, ct pending Toxic effect- patient with multiple meds including physostigmine and oxycodone, uds and etoh ordered Infection- we will obtain rectal temp and further assessment fot infection  including lactic acid Environmental- unclear home environment-will attempt rectal temp for hyperthermia assessment Psychiatric- patient without documented psychiatric history.  Prednisone is listed in med list but unclear if this is new and could have precipitated psychiatric event.    Patient care discussed with Dr. Alveria Apley and she will be to see and evaluate.   Hilario Quarry, MD 11/29/12 1618  CRITICAL CARE Performed by: Hilario Quarry Total critical care time: 45 Critical care time was exclusive of separately billable procedures and treating other patients. Critical care was necessary to treat or prevent imminent or life-threatening deterioration. Critical care was time spent personally by me on the following activities: development of treatment plan with patient and/or surrogate as well as nursing, discussions with consultants, evaluation of patient's response to treatment, examination of patient, obtaining history from patient or surrogate, ordering and performing treatments and interventions, ordering and review of laboratory studies, ordering and review of radiographic studies, pulse oximetry and re-evaluation of patient's condition.   Hilario Quarry, MD 11/29/12 [phone]

## 2012-11-29 NOTE — ED Notes (Signed)
Pt continues not stand in room and walking out of door.  Pt repeating his questions about what will be done today.  Pt states wanting to leave but attempting to keep pt in room.

## 2012-11-29 NOTE — ED Notes (Signed)
Called for Recruitment consultant.  Pt will not stay in stretcher or in room.

## 2012-11-30 DIAGNOSIS — G9340 Encephalopathy, unspecified: Secondary | ICD-10-CM | POA: Diagnosis present

## 2012-11-30 LAB — GLUCOSE, CAPILLARY
Glucose-Capillary: 126 mg/dL — ABNORMAL HIGH (ref 70–99)
Glucose-Capillary: 122 mg/dL — ABNORMAL HIGH (ref 70–99)
Glucose-Capillary: 135 mg/dL — ABNORMAL HIGH (ref 70–99)
Glucose-Capillary: 155 mg/dL — ABNORMAL HIGH (ref 70–99)

## 2012-11-30 LAB — BASIC METABOLIC PANEL
BUN: 29 mg/dL — ABNORMAL HIGH (ref 6–23)
BUN: 28 mg/dL — ABNORMAL HIGH (ref 6–23)
BUN: 28 mg/dL — ABNORMAL HIGH (ref 6–23)
CO2: 26 mEq/L (ref 19–32)
Calcium: 8.9 mg/dL (ref 8.4–10.5)
Calcium: 9.3 mg/dL (ref 8.4–10.5)
Chloride: 85 mEq/L — ABNORMAL LOW (ref 96–112)
Chloride: 85 mEq/L — ABNORMAL LOW (ref 96–112)
Chloride: 84 mEq/L — ABNORMAL LOW (ref 96–112)
Creatinine, Ser: 1.02 mg/dL (ref 0.50–1.35)
Creatinine, Ser: 1.2 mg/dL (ref 0.50–1.35)
Creatinine, Ser: 1.16 mg/dL (ref 0.50–1.35)
GFR calc Af Amer: 85 mL/min — ABNORMAL LOW (ref >90)
GFR calc Af Amer: 79 mL/min — ABNORMAL LOW (ref >90)
GFR calc Af Amer: 70 mL/min — ABNORMAL LOW (ref >90)
GFR calc Af Amer: 73 mL/min — ABNORMAL LOW (ref >90)
GFR calc non Af Amer: 60 mL/min — ABNORMAL LOW (ref >90)
GFR calc non Af Amer: 63 mL/min — ABNORMAL LOW (ref >90)
Glucose, Bld: 161 mg/dL — ABNORMAL HIGH (ref 70–99)
Potassium: 3.8 mEq/L (ref 3.5–5.1)

## 2012-11-30 LAB — CBC
HCT: 30.6 % — ABNORMAL LOW (ref 39.0–52.0)
Hemoglobin: 10.8 g/dL — ABNORMAL LOW (ref 13.0–17.0)
MCH: 26.5 pg (ref 26.0–34.0)
MCHC: 35.3 g/dL (ref 30.0–36.0)
MCV: 75.2 fL — ABNORMAL LOW (ref 78.0–100.0)
RDW: 12.2 % (ref 11.5–15.5)

## 2012-11-30 LAB — TROPONIN I: Troponin I: <0.3 ng/mL (ref <0.30)

## 2012-11-30 LAB — UREA NITROGEN, URINE: Urea Nitrogen, Ur: 190 mg/dL

## 2012-11-30 MED ORDER — MUPIROCIN CALCIUM 2 % EX CREA
TOPICAL_CREAM | Freq: Two times a day (BID) | CUTANEOUS | Status: DC
  Administered 2012-11-30: 1 via TOPICAL
  Administered 2012-12-01: 10:00:00 via TOPICAL
  Filled 2012-11-30: qty 15

## 2012-11-30 MED ORDER — MUPIROCIN 2 % EX OINT
TOPICAL_OINTMENT | Freq: Two times a day (BID) | CUTANEOUS | Status: DC
  Administered 2012-11-30 – 2012-12-01 (×2): via NASAL
  Filled 2012-11-30: qty 22

## 2012-11-30 MED ORDER — METOLAZONE 5 MG PO TABS
5.0000 mg | ORAL_TABLET | Freq: Every day | ORAL | Status: DC
  Administered 2012-12-01: 5 mg via ORAL
  Filled 2012-11-30 (×2): qty 1

## 2012-11-30 MED ORDER — OXYCODONE HCL 5 MG PO TABS
10.0000 mg | ORAL_TABLET | Freq: Four times a day (QID) | ORAL | Status: DC | PRN
  Administered 2012-11-30 – 2012-12-01 (×4): 10 mg via ORAL
  Filled 2012-11-30 (×4): qty 2

## 2012-11-30 NOTE — Evaluation (Signed)
Physical Therapy Evaluation Patient Details Name: [name] MRN: [id number] DOB: Jul 18, 1945 Today's Date: 11/30/2012 Time: 4098-1191 PT Time Calculation (min): 32 min  PT Assessment / Plan / Recommendation History of Present Illness  The patient is a 67 YO male with complex past medical history including poorly controlled diabetes mellitus type II, CHF with last EF 45%, chronic hyponatremia, CKD with baseline creatinine of 1.3, and myasthenia gravis. He is coming in today because he was acting confused and strange at a diner. He states that he occasionally falls but not that recently. He is also having some weeping from his lower legs but not different than usual.  He does live alone. He was found to be hyponatremic and had chronic appearing infarct in the periventricular white matter of the right parietal lobe.   Clinical Impression  Patient presents with decreased balance, decreased safety awareness, decreased cognition and will benefit from skilled PT in the acute setting to address deficits and progress towards eventual d/c home after STSNF stay.    PT Assessment  Patient needs continued PT services    Follow Up Recommendations  SNF;Supervision/Assistance - 24 hour    Does the patient have the potential to tolerate intense rehabilitation    N/A  Barriers to Discharge  Decreased caregiver support      Equipment Recommendations  Rolling walker with 5" wheels    Recommendations for Other Services   None  Frequency Min 3X/week    Precautions / Restrictions Precautions Precautions: Fall   Pertinent Vitals/Pain C/o generalized aches, RN aware      Mobility  Bed Mobility Bed Mobility: Supine to Sit Supine to Sit: 7: Independent Transfers Transfers: Sit to Stand;Stand to Sit Sit to Stand: 5: Supervision;From bed Stand to Sit: 5: Supervision;To bed Details for Transfer Assistance: wide BOS, c/o feeling wobbly on feet and braces back of legs against  bed Ambulation/Gait Ambulation/Gait Assistance: 4: Min assist Assistive device: 1 person hand held assist Ambulation/Gait Assistance Details: pt leaning right and using hand held assist with heavy support Gait Pattern: Step-through pattern;Trunk flexed;Lateral trunk lean to right        PT Diagnosis: Abnormality of gait  PT Problem List: Decreased cognition;Decreased activity tolerance;Decreased safety awareness;Decreased balance;Decreased mobility PT Treatment Interventions: DME instruction;Balance training;Gait training;Neuromuscular re-education;Functional mobility training;Patient/family education;Therapeutic activities;Therapeutic exercise     PT Goals(Current goals can be found in the care plan section) Acute Rehab PT Goals PT Goal Formulation: With patient Time For Goal Achievement: 12/10/12 Potential to Achieve Goals: Good  Visit Information  Last PT Received On: 11/30/12 Assistance Needed: +1 PT/OT Co-Evaluation/Treatment: Yes        Prior Functioning  Home Living Family/patient expects to be discharged to:: Private residence Living Arrangements: Parent Type of Home: House Home Layout: One level Home Equipment: None Additional Comments: Reports using a stool or some cinder blocks to help with getting into truck (F150); Lives with mother 11 y/o who is mostly self sufficient Prior Function Level of Independence: Independent Comments: worked as a Development worker, international aid: No difficulties Dominant Hand: Right    Cognition  Cognition Arousal/Alertness: Awake/alert Overall Cognitive Status: Impaired/Different from baseline Area of Impairment: Orientation Orientation Level: Time;Disoriented to General Comments: stated year 1979    Extremity/Trunk Assessment Upper Extremity Assessment Upper Extremity Assessment: Defer to OT evaluation Lower Extremity Assessment Lower Extremity Assessment: Generalized weakness (edema throughout LE's with weeping  signs of vascular insuff) Cervical / Trunk Assessment Cervical / Trunk Assessment: Kyphotic   Balance Balance Balance Assessed: Yes  Static Standing Balance Static Standing - Balance Support: No upper extremity supported Static Standing - Level of Assistance: 5: Stand by assistance Static Standing - Comment/# of Minutes: pt standing prior to sitting on bed about 3 minutes while tech making bed   End of Session PT - End of Session Equipment Utilized During Treatment: Gait belt Activity Tolerance: Patient tolerated treatment well Patient left: in bed;with nursing/sitter in room  GP     [facility] 11/30/2012, 1:42 PM [place], [place] [phone] 11/30/2012

## 2012-11-30 NOTE — Progress Notes (Signed)
UR Completed.  Dusan Lipford Jane [phone] 11/30/2012  

## 2012-11-30 NOTE — H&P (Signed)
Report called to receiving nurse, questions answered, patient without complaints.

## 2012-11-30 NOTE — Progress Notes (Signed)
Tolvaptan signed out from 2nd floor pharmacy and given to [place], [place], for patient's sodium.

## 2012-11-30 NOTE — Progress Notes (Signed)
Pt is sitting on the side of the bed asleep multiple attempts by sitter NT and myself to encourage him to lay in the bed and that the head of the bed can be raised to his preference. He refused stated that he can't breath when he lay down and that he sits in a chair at home. Pillows were placed around him so that he could lean back a little while sitting on the bed. Will continue to monitor. Pt was informed of the risk of falling he stated that he will not fall. Ilean Skill LPN

## 2012-11-30 NOTE — H&P (Addendum)
Date: 11/30/2012 Patient name: [name]  Medical record number: [id number]  Date of birth: 1946-03-19  The patient, [name], is a 67 y.o. year old male who has a past medical history as below, who has been admitted with hyponatremia and confusion. I have read teh HPI by Dr. Garald Braver and I agree with her description of events.   He has a past medical history of Sinus tachycardia (2006); Orthostatic hypotension; Unspecified essential hypertension; Hyperlipidemia; Cervical spondylosis with radiculopathy; Syrinx; Cord compression; ADD (attention deficit disorder); Decreased libido; Depression; Diabetes mellitus type 2, uncontrolled, with complications; Pulmonary nodule, left; Peripheral edema; Coronary artery disease, non-occlusive; Systolic congestive heart failure; Colitis; Hemorrhoids; Gastritis; Clostridium difficile infection (02/15/2012); Diastolic heart failure; Right bundle branch block and left posterior fascicular block; Sleep apnea; and Myasthenia gravis.   Filed Vitals:   11/30/12 1038 11/30/12 1113 11/30/12 1115 11/30/12 1116  BP:   149/84   Pulse: 123  108 104  Temp:  97.6 F (36.4 C)    TempSrc:  Oral    Resp:   16 17  Height:      Weight:      SpO2: 98%  98% 99%    Today when I met the patient, he was not oriented to time (he said the year was 1973) and place (he did not know which hospital he was in). He had no idea what had happened yesterday and why the police brought him here. Otherwise, he could make intelligent conversation, knew about his diseases, and understood the seriousness of the situation. He was adamant that he does not want any insulin for his diabetes because it made his sugars plummet to an uncomfortable level. He is not short of breath, denies chest pain, palpitations and dizziness or lightheadedness.   General: Alert and confused. Resting in bed. No acute distress.  HEENT: PERRL, EOMI, no scleral icterus. Heart: RRR with occasional ectopi, no rubs,  murmurs or gallops. Lungs: Clear to auscultation bilaterally, no wheezes, rales, or rhonchi. Abdomen: Soft, nontender, nondistended, BS present. Extremities: Warm, left leg with weeping stasis dermatitis, right leg better. Both legs have 2+ pitting edema.  Neuro: Alert and confused. Cranial nerves II-XII grossly intact,  strength and sensation to light touch equal in bilateral upper and lower extremities Psychiatric: He is confused. He does not seem depressed, however he started crying when I mentioned the name of Dr. Bard Herbert who was his PCP and will no longer be, because he is leaving the state now. He displayed normal judgement. He was unaware of the name of the hospital but knew that he was in a hospital. He displayed proper affect when told about his ailments.    Lab trends  Recent Labs Lab 11/29/12 1444 11/29/12 1445 11/30/12 0208  HGB 12.9* 12.1* 10.8*  HCT 38.0* 33.4* 30.6*  WBC  --  7.5 6.4  PLT  --  395 320    Recent Labs Lab 11/29/12 1444 11/29/12 1445 11/29/12 2026 11/30/12 0208 11/30/12 0925  NA 117* 117* 120* 123* 124*  K 4.2 4.2 3.9 4.1 3.8  CL 86* 78* 83* 85* 85*  CO2  --  26 28 26 27   GLUCOSE 101* 102* 132* 138* 161*  BUN 34* 32* 30* 29* 28*  CREATININE 1.60* 1.29 1.06 1.02 1.09  CALCIUM  --  9.6 8.9 8.9 9.1    Recent Labs Lab 11/29/12 1445  AST 37  ALT 16  ALKPHOS 100  BILITOT 0.9  PROT 7.0  ALBUMIN  3.8     Recent Labs Lab 11/29/12 2026 11/30/12 0208 11/30/12 0925  TROPONINI <0.30 <0.30 <0.30   Urinalysis    Component Value Date/Time   COLORURINE YELLOW 11/29/2012 1359   APPEARANCEUR CLEAR 11/29/2012 1359   LABSPEC 1.013 11/29/2012 1359   PHURINE 5.5 11/29/2012 1359   GLUCOSEU NEGATIVE 11/29/2012 1359   HGBUR SMALL* 11/29/2012 1359   BILIRUBINUR NEGATIVE 11/29/2012 1359   KETONESUR NEGATIVE 11/29/2012 1359   PROTEINUR >300* 11/29/2012 1359   UROBILINOGEN 0.2 11/29/2012 1359   NITRITE NEGATIVE 11/29/2012 1359   LEUKOCYTESUR NEGATIVE  11/29/2012 1359    EKG reviewed. Not concerning for Acute MI.  Imaging reviewed. His head CT shows global atrophy, and a chronic infarct in the right parietal lobe which is a new finding and was not present in the last CT done on 04/18/2012. There is no acute pathology.  Hospital Medications . aspirin EC  81 mg Oral Daily  . benazepril  20 mg Oral Daily  . heparin  5,000 Units Subcutaneous Q8H  . insulin aspart  0-9 Units Subcutaneous TID WC  . mycophenolate  1,000 mg Oral BID  . pantoprazole  40 mg Oral Daily  . predniSONE  15 mg Oral Daily  . pyridostigmine  60 mg Oral 5 X Daily  . sodium chloride  3 mL Intravenous Q12H  . tolvaptan  15 mg Oral Q24H  . torsemide  80 mg Oral BID     Assessment and Plan   I have read the note by Dr. Garald Braver. I agree with the plan of care, with the following additions:  Altered mental status  - Baseline sodium level is 120-125, and he was at 117 at admission. It could have been the reason for his confusion. Another possible reason could be that his blood sugars were close to 100 on admission, and in a patient who normally stays at 300 this is significant relative hypoglycemia, and could give rise to confusion. Today when his sodium is back to baseline, 123, he still does not seem very oriented to me. I suspect that he has underlying mild dementia and we will do MMSE. For now we will stop treating his sodium and put him back on his home meds. We will observe him for one more day to assess his mental status. The patient is refusing a psych consult. His recent office visits do not document disorientation. He has been ruled out acute MI. He has stable vitals, he can be moved out of step down unit today.   Hypotonic Hyponatremia - Likely in the setting of chronic heart failure. His urine osmolality appropriately is borderline low 136 with specific gravity near 1.013.   Uncontrolled Diabetes - Last A1c 9.1 (11/03/2012). He is keeping better sugars in the  hospital, although he has refused to take his insulin, which is not a new habit (in the setting of this confusion), he has always shown a lot of resistant to take insulin.   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 Patient is very resistant to take new medications. I discussed Statins with him given his LDL of 100 in May 2014, but he is not ready yet. He is not ready to make any alterations in his medication regimen today.       Disposition - PT assessed him and thinks he would benefit from a short term SNF placement. I agree  with that. The patient lives with his 49 year old mother, who will be unable to take care of him. We will ask the patient about this.     Rest of the chronic issues per resident note.   Aletta Edouard 11/30/2012, 2:32 PM.

## 2012-11-30 NOTE — Progress Notes (Signed)
Notified Attending that @0324  pt had approx. 1.5 minute run of SVT in the 180s and then spontaneously converted to ST in 100s. Pt remained asymptomatic throughout episode. Vitals taken and were as charted. No orders received. Will continue to monitor.

## 2012-11-30 NOTE — Evaluation (Signed)
Occupational Therapy Evaluation Patient Details Name: [name] MRN: [id number] DOB: 01-Jul-1945 Today's Date: 11/30/2012 Time: 4098-1191 OT Time Calculation (min): 32 min  OT Assessment / Plan / Recommendation History of present illness The patient is a 67 YO male with complex past medical history including poorly controlled diabetes mellitus type II, CHF with last EF 45%, chronic hyponatremia, CKD with baseline creatinine of 1.3, and myasthenia gravis. He is coming in today because he was acting confused and strange at a diner and the employees called 911 for him. He had left the diner by the time the police arrived and therefore they went to his house and brought him into the hospital. He was initially belligerant and not cooperative in the ED however after 2 mg of ativan we were able to talk to him. He states that he has been more confused lately with interruptions in his thought process. He denies any SOB or chest pain. He denies nausea or vomiting. He denies abdominal pain, diarrhea, constipation. He states that he occasionally falls but not that recently. He is also having some weeping from his lower legs but not different than usual. He is not having fevers or chills at home. He does live alone.    Clinical Impression   Pt admitted with confusion and AMS. Will continue to follow acutely in order to address below problem list. Recommending SNF for d/c planning.    OT Assessment  Patient needs continued OT Services    Follow Up Recommendations  SNF;Supervision/Assistance - 24 hour    Barriers to Discharge      Equipment Recommendations   (TBD)    Recommendations for Other Services    Frequency  Min 2X/week    Precautions / Restrictions Precautions Precautions: Fall   Pertinent Vitals/Pain  see vitals    ADL  Eating/Feeding: Performed;Independent Where Assessed - Eating/Feeding: Edge of bed Upper Body Dressing: Performed;Minimal assistance Where Assessed - Upper Body  Dressing: Unsupported sitting Lower Body Dressing: Performed;Maximal assistance (donning shoes) Where Assessed - Lower Body Dressing: Unsupported sitting Toilet Transfer: Minimal assistance;Simulated Toilet Transfer Method:  (ambulating) Toilet Transfer Equipment:  (bed) Toileting - Clothing Manipulation and Hygiene: Performed;Min guard Where Assessed - Glass blower/designer Manipulation and Hygiene: Standing Equipment Used: Gait belt Transfers/Ambulation Related to ADLs: min assist with 1 HHA (RUE). Pt frequently losing balance to right.  ADL Comments: Pt sat EOB and refused to perform further ADL tasks until he received his meds.  Pt spilled his water in his LUE ( was distracted) and required assist to don dry gown.     OT Diagnosis: Generalized weakness;Cognitive deficits  OT Problem List: Decreased strength;Decreased activity tolerance;Impaired balance (sitting and/or standing);Decreased cognition;Decreased knowledge of use of DME or AE OT Treatment Interventions: Self-care/ADL training;DME and/or AE instruction;Therapeutic activities;Patient/family education;Balance training;Cognitive remediation/compensation   OT Goals(Current goals can be found in the care plan section) Acute Rehab OT Goals OT Goal Formulation: With patient Time For Goal Achievement: 12/14/12 Potential to Achieve Goals: Good  Visit Information  Last OT Received On: 11/30/12 Assistance Needed: +1 PT/OT Co-Evaluation/Treatment: Yes History of Present Illness: The patient is a 67 YO male with complex past medical history including poorly controlled diabetes mellitus type II, CHF with last EF 45%, chronic hyponatremia, CKD with baseline creatinine of 1.3, and myasthenia gravis. He is coming in today because he was acting confused and strange at a diner and the employees called 911 for him. He had left the diner by the time the police arrived and therefore  they went to his house and brought him into the hospital. He was  initially belligerant and not cooperative in the ED however after 2 mg of ativan we were able to talk to him. He states that he has been more confused lately with interruptions in his thought process. He denies any SOB or chest pain. He denies nausea or vomiting. He denies abdominal pain, diarrhea, constipation. He states that he occasionally falls but not that recently. He is also having some weeping from his lower legs but not different than usual. He is not having fevers or chills at home. He does live alone.        Prior Functioning     Home Living Family/patient expects to be discharged to:: Private residence Living Arrangements: Parent Type of Home: House Home Layout: One level Home Equipment: None Additional Comments: Reports using a stool or some cinder blocks to help with getting into truck (F150); Lives with mother 50 y/o who is mostly self sufficient Prior Function Level of Independence: Independent Comments: worked as a Development worker, international aid: No difficulties Dominant Hand: Right         Vision/Perception Vision - History Baseline Vision: Wears glasses only for reading Patient Visual Report: No change from baseline   Cognition  Cognition Arousal/Alertness: Awake/alert Overall Cognitive Status: Impaired/Different from baseline Area of Impairment: Orientation Orientation Level: Time;Disoriented to General Comments: stated year 1979    Extremity/Trunk Assessment Upper Extremity Assessment Upper Extremity Assessment: Overall WFL for tasks assessed Lower Extremity Assessment Lower Extremity Assessment: Generalized weakness (edema throughout LE's with weeping signs of vascular insuff) Cervical / Trunk Assessment Cervical / Trunk Assessment: Kyphotic     Mobility Bed Mobility Bed Mobility: Supine to Sit Supine to Sit: 7: Independent Transfers Transfers: Sit to Stand;Stand to Sit Sit to Stand: 5: Supervision;From bed Stand to Sit: 5:  Supervision;To bed Details for Transfer Assistance: wide BOS, c/o feeling wobbly on feet and braces back of legs against bed     Exercise     Balance Balance Balance Assessed: Yes Static Standing Balance Static Standing - Balance Support: No upper extremity supported Static Standing - Level of Assistance: 5: Stand by assistance;4: Min assist Static Standing - Comment/# of Minutes: pt standing prior to sitting on bed about 3 minutes while tech making bed . close guarding for safety.   End of Session OT - End of Session Equipment Utilized During Treatment: Gait belt Activity Tolerance: Patient tolerated treatment well Patient left: in bed;with call bell/phone within reach;with nursing/sitter in room Nurse Communication: Mobility status;Patient requests pain meds  GO   11/30/2012 Cipriano Mile OTR/L Pager [phone] Office [phone]  Cipriano Mile 11/30/2012, 3:01 PM

## 2012-11-30 NOTE — Progress Notes (Signed)
Notified Attending on-call of pt's SBP in 170s. No orders received, told to call back only when SBP>180. Will continue to monitor.

## 2012-11-30 NOTE — Progress Notes (Signed)
Subjective: He is more interactive today, he states that he feels better. He denies chest pain, shortness of breath, abdominal pain.   Objective: Vital signs in last 24 hours: Filed Vitals:   11/30/12 0717 11/30/12 1113 11/30/12 1115 11/30/12 1116  BP:   149/84   Pulse:   108 104  Temp: 98.1 F (36.7 C) 97.6 F (36.4 C)    TempSrc: Oral Oral    Resp:   16 17  Height:      Weight:      SpO2:   98% 99%   Weight change:   Intake/Output Summary (Last 24 hours) at 11/30/12 1206 Last data filed at 11/30/12 0900  Gross per 24 hour  Intake    680 ml  Output   2775 ml  Net  -2095 ml   General: Sitting up in bed, cooperative to exam, his sitter at his bedside  HEENT:  no scleral icterus  Cardiac: regular rate, irregularly irregular rhythm, S1 S2 heard, no distinct murmur  Pulm: good air flow bilaterally, no overt rales or crackles  Abd: soft, nontender, nondistended, BS present  Ext: warm and well perfused, 2+ pedal edema to mid knees, weeping with dangling feet over side of bed  Neuro: alert and oriented X2 (not to time), no tactile sensation disturbances, no numbness upper or lower extremities bilaterally, 4/5 strength upper and lower extremities bilaterally, CN II-XII grossly intact  Lab Results: Basic Metabolic Panel:  Recent Labs Lab 11/30/12 0208 11/30/12 0925  NA 123* 124*  K 4.1 3.8  CL 85* 85*  CO2 26 27  GLUCOSE 138* 161*  BUN 29* 28*  CREATININE 1.02 1.09  CALCIUM 8.9 9.1   Liver Function Tests:  Recent Labs Lab 11/29/12 1445  AST 37  ALT 16  ALKPHOS 100  BILITOT 0.9  PROT 7.0  ALBUMIN 3.8    CBC:  Recent Labs Lab 11/29/12 1445 11/30/12 0208  WBC 7.5 6.4  HGB 12.1* 10.8*  HCT 33.4* 30.6*  MCV 74.9* 75.2*  PLT 395 320   Cardiac Enzymes:  Recent Labs Lab 11/29/12 2026 11/30/12 0208 11/30/12 0925  TROPONINI <0.30 <0.30 <0.30   BNP:  Recent Labs Lab 11/29/12 2026  PROBNP 6687.0*    CBG:  Recent Labs Lab 11/29/12 2143  11/30/12 0800  GLUCAP 136* 126*   Drugs of Abuse     Component Value Date/Time   LABOPIA NONE DETECTED 11/29/2012 1502   COCAINSCRNUR NONE DETECTED 11/29/2012 1502   LABBENZ NONE DETECTED 11/29/2012 1502   AMPHETMU NONE DETECTED 11/29/2012 1502   THCU NONE DETECTED 11/29/2012 1502   LABBARB NONE DETECTED 11/29/2012 1502    Alcohol Level:  Recent Labs Lab 11/29/12 1445  ETH <11   Urinalysis:  Recent Labs Lab 11/29/12 1359  COLORURINE YELLOW  LABSPEC 1.013  PHURINE 5.5  GLUCOSEU NEGATIVE  HGBUR SMALL*  BILIRUBINUR NEGATIVE  KETONESUR NEGATIVE  PROTEINUR >300*  UROBILINOGEN 0.2  NITRITE NEGATIVE  LEUKOCYTESUR NEGATIVE    Micro Results: Recent Results (from the past 240 hour(s))  MRSA PCR SCREENING     Status: None   Collection Time    11/29/12  5:59 PM      Result Value Range Status   MRSA by PCR NEGATIVE  NEGATIVE Final   Comment:            The GeneXpert MRSA Assay (FDA     approved for NASAL specimens     only), is one component of a  comprehensive MRSA colonization     surveillance program. It is not     intended to diagnose MRSA     infection nor to guide or     monitor treatment for     MRSA infections.   Studies/Results: Ct Head Wo Contrast  11/29/2012   *RADIOLOGY REPORT*  Clinical Data: Mental status changes  CT HEAD WITHOUT CONTRAST  Technique:  Contiguous axial images were obtained from the base of the skull through the vertex without contrast.  Comparison: 05/27/2010  Findings: Global atrophy. 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  No mass effect, midline shift, or acute intracranial hemorrhage. Mastoid air cells and visualized paranasal sinuses are clear.  The cranium is intact.  IMPRESSION: No acute intracranial pathology.  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   Original Report Authenticated By: Jolaine Click, M.D.   Dg Chest  Port 1 View  11/29/2012   *RADIOLOGY REPORT*  Clinical Data: Altered mental status.  Altered level of consciousness.  PORTABLE CHEST - 1 VIEW  Comparison: 10/03/2012.  Findings: The patient is rotated to the right.  Density with linear margin is present at the left lung base, favored to represent a skin fold.   Median sternotomy.  No focal consolidation.  No pleural effusion.  No convincing evidence of failure. Cardiopericardial silhouette is probably within normal limits allowing for rotation.  IMPRESSION: No gross acute cardiopulmonary disease.  Density at the left lung base favored to represent skin fold and overlapping soft tissue density.  If there is high clinical suspicion of pneumonia/airspace disease, consider PA and lateral chest.   Original Report Authenticated By: Andreas Newport, M.D.   Medications: I have reviewed the patient's current medications. Scheduled Meds: . aspirin EC  81 mg Oral Daily  . benazepril  20 mg Oral Daily  . heparin  5,000 Units Subcutaneous Q8H  . insulin aspart  0-9 Units Subcutaneous TID WC  . mycophenolate  1,000 mg Oral BID  . pantoprazole  40 mg Oral Daily  . predniSONE  15 mg Oral Daily  . pyridostigmine  60 mg Oral 5 X Daily  . sodium chloride  3 mL Intravenous Q12H  . tolvaptan  15 mg Oral Q24H  . torsemide  80 mg Oral BID   Continuous Infusions:  PRN Meds:.acetaminophen, acetaminophen, albuterol, LORazepam, oxyCODONE Assessment/Plan: Acute encephalopathy - Improving as compared to his mental status on presentation, pt more engaged today and cooperative to exam. Etiology most likely acute on chronic hyponatremia though hypoglycemia could also explain with AMS as his blood glucose on presentation was 101 and he normally runs in the 300s.  CT head ruled out hemorrhage in the brain, brain lesion. He does appear to have more atrophy than expected and a chronic infarct. He remains afebrile with source of possible infection. His Na is slowly improving.UDS  unremarkable. ACS ruled out with negative troponin x3 and no EKG changes. He is A.fib but not with RVR.   -Transfer to telemetry -No indication for antibiotics at this time  -Fluid restriction  -Tolvaptan  -Resume metolazone, continue torsemide  -MMSE  Hypoosmotic Hyponatremia - Improving with fluid restriction, Na to 120->123->124. Acute on chronic lwith Na of 117 on presentation, baseline Na of 125-128, on tolvaptan at home. Etiology likely CHF with mild hypervolemia. Serum osmolality low, urine osm nl/low, this could be explained by his mild hypervolemia on physical exam. -BMP q  12 hours -Continue fluid restriction  Diabetes mellitus type II, uncontrolled - Last HgA1c 9.1% on 11/03/12. CBGs in the 12-130s range.   A.fib - Not in RVR, no chest pain, troponin x 3 negative. May be due to acute stress from forced hospital visit and agitation. Will monitor closely.  -Continue telemetry  Myasthenia gravis without exacerbation - Continue home cellcept, mestinon. No concern for acute decompensation at this time. No respiratory distress and no problems managing secretions.   COPD, MILD - Not on home inhalers. No PFT on file.  -Albuterol inhaler q6h PRN   Chronic diastolic congestive heart failure - not in acute exacerbation but does seem to have some extra fluid in his legs.  -Continue metolazone, torsemide  CKD (chronic kidney disease), stage III - Creatinine trended down to 1.09 today from 1.29 on admission. Continue monitoring.   Abnormality of gait - The patient does fall quite a bit at home and this recent confusion and chronically low sodium levels likely contribute. Per PT/OT SNF is appropriate for me given that he lives with his 32 year old mother that will not be able to take care of him.  -Will further discuss SNF options with him  DVT ppx - heparin [place] 5000 TID  Dispo: Disposition is deferred at this time, awaiting improvement of current medical problems.  Anticipated discharge in  approximately 1-2 day(s).   The patient does have a current PCP Christen Bame, MD) and does need an [facility] hospital follow-up appointment after discharge.  The patient does not have transportation limitations that hinder transportation to clinic appointments.  .Services Needed at time of discharge: Y = Yes, Blank = No PT:   OT:   RN:   Equipment:   Other:     LOS: 1 day   Ky Barban, MD 11/30/2012, 12:06 PM

## 2012-12-01 DIAGNOSIS — G9340 Encephalopathy, unspecified: Secondary | ICD-10-CM

## 2012-12-01 LAB — BASIC METABOLIC PANEL
GFR calc Af Amer: 76 mL/min — ABNORMAL LOW (ref >90)
GFR calc non Af Amer: 66 mL/min — ABNORMAL LOW (ref >90)
Glucose, Bld: 139 mg/dL — ABNORMAL HIGH (ref 70–99)
Potassium: 3.5 mEq/L (ref 3.5–5.1)
Sodium: 126 mEq/L — ABNORMAL LOW (ref 135–145)

## 2012-12-01 LAB — CBC
Hemoglobin: 11.6 g/dL — ABNORMAL LOW (ref 13.0–17.0)
MCHC: 34.6 g/dL (ref 30.0–36.0)
Platelets: 349 10*3/uL (ref 150–400)
RDW: 12.5 % (ref 11.5–15.5)

## 2012-12-01 NOTE — Progress Notes (Signed)
Subjective: He feels much better today. He denies confusion, dizziness, or weakness. He walked in the hallway with supervision with minimum gate instability.   Objective: Vital signs in last 24 hours: Filed Vitals:   11/30/12 1515 11/30/12 1857 12/01/12 0500 12/01/12 0553  BP: 151/86 158/81 158/77   Pulse: 103 112 114   Temp: 98.1 F (36.7 C) 98 F (36.7 C)    TempSrc: Oral Oral    Resp: 16 17 18    Height:      Weight:    174 lb 13.2 oz (79.3 kg)  SpO2: 97% 96% 98%    Weight change: -6 lb 6.3 oz (-2.9 kg)  Intake/Output Summary (Last 24 hours) at 12/01/12 1159 Last data filed at 12/01/12 0957  Gross per 24 hour  Intake    840 ml  Output      0 ml  Net    840 ml   Vitals reviewed. General: Walking in to his room with nurse tech, cooperative to exam HEENT:  no scleral icterus Cardiac: RRR, no rubs, murmurs or gallops Pulm: clear to auscultation bilaterally, no wheezes, rales, or rhonchi Abd: soft, nontender, nondistended, BS present Ext: warm and well perfused, 2+ pedal edema to mid knees, legs warped with gauze that is c/d/i bilaterally  Neuro: alert and oriented X3 (he does not know the year but knows the month and the season of the year), mini cog with normal clock and 2 points, cranial nerves II-XII grossly intact, strength and sensation to light touch equal in bilateral upper and lower extremities   Lab Results: Basic Metabolic Panel:  Recent Labs Lab 11/30/12 2001 12/01/12 0600  NA 124* 126*  K 3.7 3.5  CL 86* 88*  CO2 29 29  GLUCOSE 180* 139*  BUN 28* 28*  CREATININE 1.16 1.12  CALCIUM 9.3 9.4   Liver Function Tests:  Recent Labs Lab 11/29/12 1445  AST 37  ALT 16  ALKPHOS 100  BILITOT 0.9  PROT 7.0  ALBUMIN 3.8   CBC:  Recent Labs Lab 11/30/12 0208 12/01/12 0600  WBC 6.4 6.1  HGB 10.8* 11.6*  HCT 30.6* 33.5*  MCV 75.2* 77.5*  PLT 320 349   Cardiac Enzymes:  Recent Labs Lab 11/29/12 2026 11/30/12 0208 11/30/12 0925  TROPONINI  <0.30 <0.30 <0.30   BNP:  Recent Labs Lab 11/29/12 2026  PROBNP 6687.0*   CBG:  Recent Labs Lab 11/29/12 2143 11/30/12 0800 11/30/12 1146 11/30/12 1644 11/30/12 2134 12/01/12 0751  GLUCAP 136* 126* 122* 135* 155* 125*   Urine Drug Screen: Drugs of Abuse     Component Value Date/Time   LABOPIA NONE DETECTED 11/29/2012 1502   COCAINSCRNUR NONE DETECTED 11/29/2012 1502   LABBENZ NONE DETECTED 11/29/2012 1502   AMPHETMU NONE DETECTED 11/29/2012 1502   THCU NONE DETECTED 11/29/2012 1502   LABBARB NONE DETECTED 11/29/2012 1502    Alcohol Level:  Recent Labs Lab 11/29/12 1445  ETH <11   Urinalysis:  Recent Labs Lab 11/29/12 1359  COLORURINE YELLOW  LABSPEC 1.013  PHURINE 5.5  GLUCOSEU NEGATIVE  HGBUR SMALL*  BILIRUBINUR NEGATIVE  KETONESUR NEGATIVE  PROTEINUR >300*  UROBILINOGEN 0.2  NITRITE NEGATIVE  LEUKOCYTESUR NEGATIVE     Micro Results: Recent Results (from the past 240 hour(s))  MRSA PCR SCREENING     Status: None   Collection Time    11/29/12  5:59 PM      Result Value Range Status   MRSA by PCR NEGATIVE  NEGATIVE Final  Comment:            The GeneXpert MRSA Assay (FDA     approved for NASAL specimens     only), is one component of a     comprehensive MRSA colonization     surveillance program. It is not     intended to diagnose MRSA     infection nor to guide or     monitor treatment for     MRSA infections.   Studies/Results: Ct Head Wo Contrast  11/29/2012   *RADIOLOGY REPORT*  Clinical Data: Mental status changes  CT HEAD WITHOUT CONTRAST  Technique:  Contiguous axial images were obtained from the base of the skull through the vertex without contrast.  Comparison: 05/27/2010  Findings: Global atrophy. 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  No mass effect, midline shift, or acute intracranial hemorrhage. Mastoid air cells and visualized paranasal sinuses are clear.  The cranium is  intact.  IMPRESSION: No acute intracranial pathology.  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   Original Report Authenticated By: Jolaine Click, M.D.   Dg Chest Port 1 View  11/29/2012   *RADIOLOGY REPORT*  Clinical Data: Altered mental status.  Altered level of consciousness.  PORTABLE CHEST - 1 VIEW  Comparison: 10/03/2012.  Findings: The patient is rotated to the right.  Density with linear margin is present at the left lung base, favored to represent a skin fold.   Median sternotomy.  No focal consolidation.  No pleural effusion.  No convincing evidence of failure. Cardiopericardial silhouette is probably within normal limits allowing for rotation.  IMPRESSION: No gross acute cardiopulmonary disease.  Density at the left lung base favored to represent skin fold and overlapping soft tissue density.  If there is high clinical suspicion of pneumonia/airspace disease, consider PA and lateral chest.   Original Report Authenticated By: Andreas Newport, M.D.   Medications: I have reviewed the patient's current medications. Scheduled Meds: . aspirin EC  81 mg Oral Daily  . benazepril  20 mg Oral Daily  . heparin  5,000 Units Subcutaneous Q8H  . insulin aspart  0-9 Units Subcutaneous TID WC  . metolazone  5 mg Oral Daily  . mupirocin cream   Topical BID  . mupirocin ointment   Nasal BID  . mycophenolate  1,000 mg Oral BID  . pantoprazole  40 mg Oral Daily  . predniSONE  15 mg Oral Daily  . pyridostigmine  60 mg Oral 5 X Daily  . sodium chloride  3 mL Intravenous Q12H  . tolvaptan  15 mg Oral Q24H  . torsemide  80 mg Oral BID   Continuous Infusions:  PRN Meds:.acetaminophen, acetaminophen, albuterol, LORazepam, oxyCODONE Assessment/Plan:  Acute encephalopathy - Resolved, mini cog with normal clock face, 2 points on recall, indicating no cognitive impairment. Etiology most likely acute on chronic hyponatremia. Hypoglycemia unlikely but  could also explain with AMS as his blood glucose on presentation was 101 and he normally runs in the 300s. CT head ruled out hemorrhage in the brain, brain lesion. He does appear to have more atrophy than expected and a chronic infarct. He remains afebrile with no source of possible infection. UDS unremarkable. ACS ruled out with negative troponin x3 and no EKG changes. He had A.fib yesterday but no RVR. His Na has slowly improved to his baseline. -On telemetry  -No indication for antibiotics at this time  -  Fluid restriction  -Tolvaptan (to be discontinued in 30 days) -Resume metolazone, continue torsemide   Hypoosmotic Hyponatremia - Improving with fluid restriction, Na to 120->123->124->126. Acute on chronic with Na of 117 on presentation, baseline Na of 125-128, on tolvaptan at home but he had been nocompliant. Etiology likely CHF with mild hypervolemia. Serum osmolality low, urine osm nl/low, this could be explained by his mild hypervolemia on physical exam.  -BMP q 12 hours  -Continue fluid restriction  -Tolvaptan for a total of 30 days treatment  Diabetes mellitus type II, uncontrolled - Last HgA1c 9.1% on 11/03/12. CBGs in the 120-130s range.   A.fib - Not in RVR, no chest pain, troponin x 3 negative. May be due to acute stress from forced hospital visit and agitation. Will monitor closely.  -Continue telemetry   Myasthenia gravis without exacerbation - Continue home cellcept, mestinon. No concern for acute decompensation at this time. No respiratory distress and no problems managing secretions.   COPD, MILD - Stable, no SOB. Not on home inhalers. No PFT on file.  -Albuterol inhaler q6h PRN   Chronic diastolic congestive heart failure - not in acute exacerbation but does seem to have some extra fluid in his legs. Mild hypervolemia, orthopnea at baseline, he sleeps sitting up. Bilateral LE edema slightly improved. He will continue wrapping his legs at home to improve the swelling and will  apply Bactroban ointment BID for his chronic dermatitis. He understands that he needs to restrict his fluid intake at home to 1200 ml/day.   -Continue metolazone, torsemide   CKD (chronic kidney disease), stage III - Creatinine trended down to 1.12 today from 1.29 on admission. Continue monitoring.   Abnormality of gait - The patient does fall quite a bit at home and this recent confusion and chronically low sodium levels likely contribute. Per PT/OT SNF is appropriate for me given that he lives with his 22 year old mother that will not be able to take care of him. However, he refuses SNF, [facility], Home Health PT/OT, RN, nurse aide, or wound care. He is competent to make his medical decisions and explains that he values his privacy and does not want strangers in his house. He will call the clinic and ask for help if he changes his mind.   DVT ppx - heparin [place] 5000 TID   Dispo: Disposition is deferred at this time, awaiting improvement of current medical problems.  Anticipated discharge in approximately today.   The patient does have a current PCP Christen Bame, MD) and does need an [facility] hospital follow-up appointment after discharge.  The patient does not have transportation limitations that hinder transportation to clinic appointments.  .Services Needed at time of discharge: Y = Yes, Blank = No PT:   OT:   RN:   Equipment:   Other:     LOS: 2 days   Ky Barban, MD 12/01/2012, 11:59 AM

## 2012-12-01 NOTE — Progress Notes (Signed)
Internal Medicine Teaching Attending Note Date: 12/01/2012  Patient name: [name]  Medical record number: [id number]  Date of birth: 02-29-1946  [name] is completely oriented today - he told me the correct date, year, place, his house address and name of the hospital. He is also sharper and making more sense. I suspect that he does have some underlying dementia, however he did the Mini-Cog fine today. Right now, he appears to be competent, knows what is right and what is wrong, knows about the risks of his diseases, and choices, however, continues to be fixated on the same things that he has previously been found to be fixated on (according to his office visits in the clinic).  - He is extremely resistant to change in his medications.  - He does not want any help at home. - He does not want to go to a SNF.  - He is fixated on his old PCP Dr. Tonny Branch and cries many times taking his name.  - He refuses psychiatry consultation.  I had a long discussion of about 45 minutes with him today regarding his discharge services. I tried to reason with him that he needed help, and that if he is refusing SNF, he should, at the very least, accept home health services. However, he does not want any one in his house, for privacy reasons. I did go over his medications, and he knows his medications very well. He has a system for taking his meds, and is compliant. On repeated asking, he said he might consider someone coming to bandage his legs twice a week - but he is considering that, he said, "its not a final answer".   His serum sodium is back to his baseline.    From a cardiac standpoint, he is not more short of breath than his baseline, his lungs are clear on exam, and his legs have chronic swelling and stasis dermatitis and weeping wounds which are unchanged from before. We will discharge him with a follow up with the heart failure clinic and [place] [facility].    Rambo Sarafian 12/01/2012, 11:43  AM.

## 2012-12-01 NOTE — Progress Notes (Signed)
Pt. discharged to floor,verbalized understanding of discharged instruction,medication,restriction,diet and follow up appointment.Baseline Vitals sign stable,Pt comfortable,no sign and symptom of distress. 

## 2012-12-01 NOTE — Discharge Summary (Signed)
Name: [name] MRN: [id number] DOB: August 07, 1945 67 y.o. PCP: Christen Bame, MD  Date of Admission: 11/29/2012  1:41 PM Date of Discharge: 12/01/2012 Attending Physician: Aletta Edouard, MD  Discharge Diagnosis: Principal Problem:   Acute encephalopathy secondary to hyponatremia Active Problems:   Diabetes mellitus type II, uncontrolled   SINUS TACHYCARDIA   Myasthenia gravis without exacerbation   COPD, MILD   Chronic diastolic congestive heart failure   Hyponatremia   CKD (chronic kidney disease), stage III   Abnormality of gait   Atrial fibrillation  Discharge Medications:   Medication List    STOP taking these medications       doxycycline 100 MG tablet  Commonly known as:  VIBRA-TABS      TAKE these medications       aspirin 81 MG EC tablet  Take 1 tablet (81 mg total) by mouth daily.     benazepril 20 MG tablet  Commonly known as:  LOTENSIN  Take 1 tablet (20 mg total) by mouth daily.     MESTINON 60 MG tablet  Generic drug:  pyridostigmine  Take 60 mg by mouth 5 (five) times daily. Medication starts to wear off closer to 2.5 hours     metolazone 5 MG tablet  Commonly known as:  ZAROXOLYN  Take 1 tablet (5 mg total) by mouth daily.     mirtazapine 30 MG tablet  Commonly known as:  REMERON  Take 1 tablet (30 mg total) by mouth at bedtime.     mupirocin ointment 2 %  Commonly known as:  BACTROBAN  Apply topically 3 (three) times daily.     mycophenolate 500 MG tablet  Commonly known as:  CELLCEPT  Take 1,000 mg by mouth 2 (two) times daily.     oxyCODONE 5 MG immediate release tablet  Commonly known as:  Oxy IR/ROXICODONE  Take 1 tablet (5 mg total) by mouth every 4 (four) hours as needed. For pain     pantoprazole 40 MG tablet  Commonly known as:  PROTONIX  Take 1 tablet (40 mg total) by mouth daily.     tolvaptan 15 MG Tabs  Commonly known as:  SAMSCA  Take 1 tablet (15 mg total) by mouth daily.        Disposition and follow-up:    Mr.Kiron G Shiffler was discharged from [facility] in Stable condition.  At the hospital follow up visit please address:  1.  Assure compliance with his medications. Please note that his treatment with Tolvaptan should be limited to 30 days (end date of 12/29/12). Please measure liver enzymes as serious hepatotoxicity can result from this drug. If hepatotoxicity/ liver impairment from any cause is suspected, discontinue use. Discontinue use in patients suspected to have hypovolemia, or unexpected serum sodium rise, hyperkalemia, or significant renal impairment.  Please assure that he follows up with the [facility] Failure Clinic. He has refused Home Health Services including PT/OT, RN, and nurse aide, but he may be agreeable to [facility] wound care or outpatient wound care for his legs. Reassess his willingness to start antihyperglycemic agents. Please consider referral to PFTs.   2.  Labs / imaging needed at time of follow-up: BMET (Na 126 on discharge), CBC (Hg 11.6 on discharge)  3.  Pending labs/ test needing follow-up: None  Follow-up Appointments:     Follow-up Information   Follow up with Darden Palmer, MD On 12/20/12. (at 2:45PM)    Contact information:   [address]  [address] [place] [address] [phone]       Schedule an appointment as soon as possible for a visit with [facility]. (for 1-2 weeks)    Contact information:   [address] [place] [address] [phone]      Discharge Instructions: Discharge Orders   Future Appointments Provider Department Dept Phone   2013-01-02 2:45 PM Darden Palmer, MD [facility] [phone]   12/12/2012 10:40 AM [facility] [phone]   Future Orders Complete By Expires     Contraindication beta blocker-discharge  As directed     Contraindication to ACEI at discharge  As directed      Diet - low sodium heart healthy  As directed     Discharge wound care:  As directed     Comments:      Change dressing in your legs daily, keep it clean and dry    Heart Failure patients record your daily weight using the same scale at the same time of day  As directed     Increase activity slowly  As directed        Consultations:  PT/OT  Procedures Performed:  Ct Head Wo Contrast  11/29/2012   *RADIOLOGY REPORT*  Clinical Data: Mental status changes  CT HEAD WITHOUT CONTRAST  Technique:  Contiguous axial images were obtained from the base of the skull through the vertex without contrast.  Comparison: 05/27/2010  Findings: Global atrophy. 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  No mass effect, midline shift, or acute intracranial hemorrhage. Mastoid air cells and visualized paranasal sinuses are clear.  The cranium is intact.  IMPRESSION: No acute intracranial pathology.  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   Original Report Authenticated By: Jolaine Click, M.D.   Dg Chest Port 1 View  11/29/2012   *RADIOLOGY REPORT*  Clinical Data: Altered mental status.  Altered level of consciousness.  PORTABLE CHEST - 1 VIEW  Comparison: 10/03/2012.  Findings: The patient is rotated to the right.  Density with linear margin is present at the left lung base, favored to represent a skin fold.   Median sternotomy.  No focal consolidation.  No pleural effusion.  No convincing evidence of failure. Cardiopericardial silhouette is probably within normal limits allowing for rotation.  IMPRESSION: No gross acute cardiopulmonary disease.  Density at the left lung base favored to represent skin fold and overlapping soft tissue density.  If there is high clinical suspicion of pneumonia/airspace disease, consider PA and lateral chest.   Original Report Authenticated By: Andreas Newport, M.D.     Admission HPI:  The patient is a 67 YO male with complex past medical history including poorly controlled diabetes mellitus type II, CHF with last EF 45%, chronic hyponatremia, CKD with baseline creatinine of 1.3, and myasthenia gravis. He is coming in today because he was acting confused and strange at a diner and the employees called 911 for him. He had left the diner by the time the police arrived and therefore they went to his house and brought him into the hospital. He was initially belligerant and not cooperative in the ED however after 2 mg of ativan we were able to talk to him. He states that he has been more confused lately with interruptions in his thought process. He denies  any SOB or chest pain. He denies nausea or vomiting. He denies abdominal pain, diarrhea, constipation. He states that he occasionally falls but not that recently. He is also having some weeping from his lower legs but not different than usual. He is not having fevers or chills at home.   Hospital Course by problem list:  Acute encephalopathy secondary to hyponatremia - Resolved. Mini-cog performed prior to his discharge with normal clock face, 2 points on recall, indicating no cognitive impairment. Etiology of his acute encephalopathy most likely acute on chronic hyponatremia in the setting of hypervolemia. Hypoglycemia unlikely but could also explain with AMS as his blood glucose on presentation was 101 and he normally runs in the 300s. CT head ruled out hemorrhage in the brain or brain lesion. On CT head, however, he does appear to have more atrophy than expected and a chronic infarct. He remained afebrile throughout this hospitalization with no source of possible infection. UDS was unremarkable. ACS ruled out with negative troponin x3 and no EKG changes. He had A.fib on 6/25 but no RVR. His Na has slowly improved to his baseline with fluid restriction, tolvaptan, metolazone, and torsemide. Of note, based on current  recommendations, Tolvaptan should be discontinued after 30 days of therapy, on 12/29/12. He will follow up at the HF clinic as well as the [facility] for outpatient monitoring of his hyponatremia.   Hypoosmotic Hyponatremia - Improved with fluid restriction with appropriate gradual increase in sodium: Na to 120->123->124->126. This was likely acute on chronic with Na of 117 on presentation. He has baseline Na of 125-128. He had been prescribed tolvaptan at home but he had been nocompliant. CHF with mild hypervolemia likely worsened his hyponatremia. His serum osmolality was low, and his urine osm was normal/low, this could be explained by his mild hypervolemia on physical exam. As discussed above this improved with fluid restriction, diuretics, and Tolvaptan. See above for further discussion.   Diabetes mellitus type II, uncontrolled - Last HgA1c 9.1% on 11/03/12. CBGs in the 120-130s range during this hospitalization. He is not on any medications for this at home and he is resistant to medication changes. In the past he has refused to take antihyperglycemics stating that he does not do well with CBGs below 300s. He will follow up at the [facility] for possible initiation of antihyperglycemics.   Atrial fibrilation - Paroxysmal, resolved. During this hospitalization not in RVR, no chest pain, troponin x 3 negative. May be due to acute stress from forced hospital visit and agitation.    Myasthenia gravis without exacerbation - We continued his home Cellcept and Mestinon. No concern for acute decompensation at this time. No respiratory distress and no problems managing secretions.   COPD, MILD - Stable, no shortness of breath. He is not on home inhalers with no PFTs on file.   Chronic diastolic congestive heart failure - No acute exacerbation but extra fluid in his legs. Mild hypervolemia, orthopnea at baseline, he sleeps sitting up. Bilateral LE edema slightly improved prior to his discharge after fluid restriction and  diuretics. He will continue wrapping his legs at home to improve the swelling and will apply Bactroban ointment BID for his chronic dermatitis. He understands that he needs to restrict his fluid intake at home to 1200 ml/day. He will continue metolazone, torsemide after his discharge. He was advised to follow up with the HF clinic.    CKD (chronic kidney disease), stage III - Stable. Creatinine trended down to 1.12 from 1.29 on admission.  Continue monitoring in the outpatient setting.    Abnormality of gait - The patient does fall quite a bit at home and this recent confusion and chronically low sodium levels likely contributed to his falls. Per PT/OT SNF is appropriate for him given that he lives with his 90 year old mother that will not be able to take care of him. However, he refuses SNF, [facility], Home Health PT/OT, RN, nurse aide, or [facility] wound care. He is competent to make his medical decisions and explains that he values his privacy and does not want strangers in his house. He states that he will call the clinic and ask for help if he changes his mind.   Discharge Vitals:   BP 158/77  Pulse 114  Temp(Src) 98 F (36.7 C) (Oral)  Resp 18  Ht 5\' 7"  (1.702 m)  Wt 174 lb 13.2 oz (79.3 kg)  BMI 27.37 kg/m2  SpO2 98%  Discharge Labs:  Results for orders placed during the hospital encounter of 11/29/12 (from the past 24 hour(s))  GLUCOSE, CAPILLARY     Status: Abnormal   Collection Time    11/30/12  4:44 PM      Result Value Range   Glucose-Capillary 135 (*) 70 - 99 mg/dL   Comment 1 Notify RN     Comment 2 Documented in Chart    BASIC METABOLIC PANEL     Status: Abnormal   Collection Time    11/30/12  5:46 PM      Result Value Range   Sodium 125 (*) 135 - 145 mEq/L   Potassium 4.3  3.5 - 5.1 mEq/L   Chloride 84 (*) 96 - 112 mEq/L   CO2 28  19 - 32 mEq/L   Glucose, Bld 128 (*) 70 - 99 mg/dL   BUN 29 (*) 6 - 23 mg/dL   Creatinine, Ser 1.61  0.50 - 1.35 mg/dL   Calcium 9.4  8.4 - 09.6  mg/dL   GFR calc non Af Amer 60 (*) >90 mL/min   GFR calc Af Amer 70 (*) >90 mL/min  BASIC METABOLIC PANEL     Status: Abnormal   Collection Time    11/30/12  8:01 PM      Result Value Range   Sodium 124 (*) 135 - 145 mEq/L   Potassium 3.7  3.5 - 5.1 mEq/L   Chloride 86 (*) 96 - 112 mEq/L   CO2 29  19 - 32 mEq/L   Glucose, Bld 180 (*) 70 - 99 mg/dL   BUN 28 (*) 6 - 23 mg/dL   Creatinine, Ser 0.45  0.50 - 1.35 mg/dL   Calcium 9.3  8.4 - 40.9 mg/dL   GFR calc non Af Amer 63 (*) >90 mL/min   GFR calc Af Amer 73 (*) >90 mL/min  GLUCOSE, CAPILLARY     Status: Abnormal   Collection Time    11/30/12  9:34 PM      Result Value Range   Glucose-Capillary 155 (*) 70 - 99 mg/dL  CBC     Status: Abnormal   Collection Time    12/01/12  6:00 AM      Result Value Range   WBC 6.1  4.0 - 10.5 K/uL   RBC 4.32  4.22 - 5.81 MIL/uL   Hemoglobin 11.6 (*) 13.0 - 17.0 g/dL   HCT 81.1 (*) 91.4 - 78.2 %   MCV 77.5 (*) 78.0 - 100.0 fL   MCH 26.9  26.0 - 34.0 pg  MCHC 34.6  30.0 - 36.0 g/dL   RDW 95.6  21.3 - 08.6 %   Platelets 349  150 - 400 K/uL  BASIC METABOLIC PANEL     Status: Abnormal   Collection Time    12/01/12  6:00 AM      Result Value Range   Sodium 126 (*) 135 - 145 mEq/L   Potassium 3.5  3.5 - 5.1 mEq/L   Chloride 88 (*) 96 - 112 mEq/L   CO2 29  19 - 32 mEq/L   Glucose, Bld 139 (*) 70 - 99 mg/dL   BUN 28 (*) 6 - 23 mg/dL   Creatinine, Ser 5.78  0.50 - 1.35 mg/dL   Calcium 9.4  8.4 - 46.9 mg/dL   GFR calc non Af Amer 66 (*) >90 mL/min   GFR calc Af Amer 76 (*) >90 mL/min  GLUCOSE, CAPILLARY     Status: Abnormal   Collection Time    12/01/12  7:51 AM      Result Value Range   Glucose-Capillary 125 (*) 70 - 99 mg/dL  GLUCOSE, CAPILLARY     Status: Abnormal   Collection Time    12/01/12 12:05 PM      Result Value Range   Glucose-Capillary 152 (*) 70 - 99 mg/dL    Signed: Ky Barban, MD 12/01/2012, 1:07 PM   Time Spent on Discharge: 50 minutes Services Ordered  on Discharge: None, the patient refused all Home Health Services Equipment Ordered on Discharge: None

## 2012-12-02 ENCOUNTER — Other Ambulatory Visit: Payer: Self-pay | Admitting: *Deleted

## 2012-12-02 ENCOUNTER — Other Ambulatory Visit: Payer: Self-pay | Admitting: Internal Medicine

## 2012-12-02 ENCOUNTER — Telehealth: Payer: Self-pay | Admitting: *Deleted

## 2012-12-02 DIAGNOSIS — M5412 Radiculopathy, cervical region: Secondary | ICD-10-CM

## 2012-12-02 MED ORDER — ZOLPIDEM TARTRATE 10 MG PO TABS: 5.0000 mg | ORAL_TABLET | Freq: Every day | ORAL | Status: AC

## 2012-12-02 NOTE — Telephone Encounter (Signed)
Cal from nurse @ [facility], Dr [facility] office just checking to see if you were aware of pt's low sodium values. I assured them that Dr Tonny Branch is aware.

## 2012-12-02 NOTE — Telephone Encounter (Signed)
Script called to pharm

## 2012-12-02 NOTE — Telephone Encounter (Signed)
Pt informed

## 2012-12-02 NOTE — Telephone Encounter (Signed)
Was Rx'd ER for the leg wound. He should be F/U with wound care and needs to follow their recs

## 2012-12-03 DIAGNOSIS — I4891 Unspecified atrial fibrillation: Secondary | ICD-10-CM | POA: Diagnosis present

## 2012-12-04 ENCOUNTER — Emergency Department (INDEPENDENT_AMBULATORY_CARE_PROVIDER_SITE_OTHER)
Admission: EM | Admit: 2012-12-04 | Discharge: 2012-12-04 | Disposition: A | Discharge status: Home / Self Care | Payer: Medicare Other | Source: Home / Self Care

## 2012-12-04 DIAGNOSIS — I8310 Varicose veins of unspecified lower extremity with inflammation: Secondary | ICD-10-CM

## 2012-12-04 DIAGNOSIS — I872 Venous insufficiency (chronic) (peripheral): Secondary | ICD-10-CM

## 2012-12-04 DIAGNOSIS — I5032 Chronic diastolic (congestive) heart failure: Secondary | ICD-10-CM

## 2012-12-04 MED ORDER — MUPIROCIN 2 % EX OINT: TOPICAL_OINTMENT | Freq: Three times a day (TID) | CUTANEOUS | Status: AC

## 2012-12-04 MED ORDER — BACITRACIN 500 UNIT/GM EX OINT
1.0000 "application " | TOPICAL_OINTMENT | Freq: Once | CUTANEOUS | Status: AC
  Administered 2012-12-04: 1 via TOPICAL

## 2012-12-04 NOTE — Assessment & Plan Note (Signed)
We had a LONG discussion today, greater then 30 minutes with greater then 50% in direct counseling to th patient, about the need to control his diabetes to help limit the damage on his kidneys and heart.  He continues to insist that his body does not function with a blood sugar below 300.  We discussed that he needs to slowly bring down to a more normal range.  He absolutely refuses insulin at this time but will give metformin a try.  We will titrate slowly knowing that it will not get [place] to goal.  We will start 500 mg BID.  I also insisted that he NOT check his blood sugar and that if he feels "woozy" that he should just have a small snack and not focus on the number.  I'm hoping that if he does this and starts to feel well at a lower blood sugar he will see that his body does in fact "work" at a blood sugar that is more in control.

## 2012-12-04 NOTE — Assessment & Plan Note (Signed)
Sodium (mEq/L)  Date Value  08/24/2012 128*  08/22/2012 125*  08/20/2012 123*  08/18/2012 128*  07/12/2012 134*   His sodium continues to be low but corrects to just under normal with his blood sugar.  We discussed his fluid restriction and that 64 oz of water per day along with everything else he is eating is too much.  I reinforced today that he should consume no more then 1200 ml of fluid per day which 40 oz per day.  He was told that he needs to include everything that he drinks including soups, juice, water, and milk.

## 2012-12-04 NOTE — Assessment & Plan Note (Signed)
He continues to struggle with his depression and anxiety but today is doing better.  He states he slept well last night which he thinks is a big key for him.  We will continue the remeron for now and continue to push SSRI and counseling for his depression and anxiety.

## 2012-12-04 NOTE — Assessment & Plan Note (Signed)
HE is taking his medications as prescribed and is mildly more fluid overloaded today.    BP Readings from Last 3 Encounters:  08/31/12 143/74  08/28/12 127/81  08/24/12 146/73   Blood pressure is up mildly today from his last visit 3 days ago.  We will increase his torsemide to 4 tablets BID and continue to monitor.

## 2012-12-04 NOTE — Assessment & Plan Note (Signed)
HE states that he is taking his blood pressure medication and his diuretic as prescribed.  He continues to complain of dizziness where he feels like he can't sit down or lay down but he is not orthostatic in the clinic today.    BP Readings from Last 3 Encounters:  08/08/12 152/77  07/15/12 145/67  07/12/12 153/72   Blood pressure is elevated over his goal of <140/80 today.  HE appears euvolemic today.  We will have him continue his blood pressure medication and we discussed a low sodium diet today to help limit his swelling and bring his BP down.

## 2012-12-04 NOTE — Assessment & Plan Note (Signed)
He is maintained on cellcept and mestinon.  He is followed by Dr. Dimas Aguas at [facility] and is due for follow up labs today including liver function panel and GGT.  We will draw these today and fax the results to Dr. Dimas Aguas.

## 2012-12-04 NOTE — Assessment & Plan Note (Signed)
He continues to have problems with pain in his hands and abdomen.  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  We will refill his oxycodone today.

## 2012-12-04 NOTE — Assessment & Plan Note (Signed)
Fluid status looks hypervolemic today again.  We will increase his torsemide today to 100 mg BID and continue to monitor.  He may need metolazone but we will see what the increased torsemide does first.

## 2012-12-04 NOTE — ED Provider Notes (Signed)
History    CSN: [id number] Arrival date & time 12/04/12  1100  First MD Initiated Contact with Patient 12/04/12 1113     Chief Complaint  Patient presents with  . Leg Pain   (Consider location/radiation/quality/duration/timing/severity/associated sxs/prior Treatment) HPI Comments: 67 year old male presents with complaints of pain in the left lower extremities. This is a chronic condition involving the venous stasis changes and peripheral neuropathy. His been seen multiple times by various healthcare providers including emergency department. He is requesting ointment to be placed on his legs and a wrap. He denies chest pain or shortness of breath. Today he states he has been relatively stable condition with no new complaints.  Past Medical History  Diagnosis Date  . Sinus tachycardia 2006    a. Nonobstructive CAD by cath 2013. b. s/p atrial tachycardia ablation 08/2011 (sinus node modification)  . Orthostatic hypotension   . Unspecified essential hypertension   . Hyperlipidemia   . Cervical spondylosis with radiculopathy   . Syrinx     T5-T6  . Cord compression     Cord compression syndrome C5-C6, C6-C7  . ADD (attention deficit disorder)   . Decreased libido   . Depression   . Diabetes mellitus type 2, uncontrolled, with complications     CAD, orthostatic hypotension, erectile dysfunction  . Pulmonary nodule, left     Evaluated 04/2011 by pulmonology - felt to be benign granuloma  . Peripheral edema   . Coronary artery disease, non-occlusive     Mild nonobstructive CAD by cath 08/2011  . Systolic congestive heart failure     By echo 2013 at [facility] - reported EF 45% per pt  . Colitis     with surface exudate  . Hemorrhoids     small  . Gastritis   . Clostridium difficile infection 02/15/2012    causing pseudomembranosu colitis  . Diastolic heart failure     grade 2 per echocardiogram (2011)  . Right bundle branch block and left posterior fascicular block   . Sleep apnea      occassionally uses cpap  . 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 Thymectomy  april 2012  . Colonoscopy  11/13/2011    Procedure: COLONOSCOPY;  Surgeon: Theda Belfast, MD;  Location: [facility];  Service: Endoscopy;  Laterality: N/A;  . Cardiac catheterization    . Esophagogastroduodenoscopy  05/13/2012    Procedure: ESOPHAGOGASTRODUODENOSCOPY (EGD);  Surgeon: Iva Boop, MD;  Location: [facility];  Service: Endoscopy;  Laterality: N/A;  needs PAT w/ dx of Myastenia gravis and multiple meds. Pt requested lidocaine to numb IV site prior to start.  Very needle phobic.   Family History  Problem Relation Age of Onset  . Breast cancer Mother   . Other Father     Beck's Sarcoid  . Colon cancer Neg Hx   . Prostate cancer Neg Hx    History  Substance Use Topics  . Smoking status: Former Smoker -- 2.50 packs/day for 30 years    Quit date: 06/09/1991  . Smokeless tobacco: Never Used  . Alcohol Use: No    Review of Systems  Constitutional: Negative for fever, activity change and fatigue.  Respiratory: Negative for cough, shortness of breath and wheezing.   Cardiovascular: Positive for leg swelling. Negative for chest pain.  Gastrointestinal: Negative.   Genitourinary: Negative.   Musculoskeletal: Negative.   Skin: Positive for rash.  Allergies  Aminoglycosides; Beta adrenergic blockers; Calcium channel blockers; Metoprolol; Neuromuscular blocking agents; Other; Penicillins; and Quinine derivatives  Home Medications   Current Outpatient Rx  Name  Route  Sig  Dispense  Refill  . aspirin EC 81 MG EC tablet   Oral   Take 1 tablet (81 mg total) by mouth daily.   30 tablet   11   . benazepril (LOTENSIN) 20 MG tablet   Oral   Take 1 tablet (20 mg total) by mouth daily.   30 tablet   2   . metolazone (ZAROXOLYN) 5 MG tablet   Oral   Take 1 tablet (5 mg total) by mouth  daily.   30 tablet   3   . mirtazapine (REMERON) 30 MG tablet   Oral   Take 1 tablet (30 mg total) by mouth at bedtime.   30 tablet   1   . mupirocin ointment (BACTROBAN) 2 %   Topical   Apply topically 3 (three) times daily.   22 g   0   . mycophenolate (CELLCEPT) 500 MG tablet   Oral   Take 1,000 mg by mouth 2 (two) times daily.          . pantoprazole (PROTONIX) 40 MG tablet   Oral   Take 1 tablet (40 mg total) by mouth daily.   30 tablet   6   . pyridostigmine (MESTINON) 60 MG tablet   Oral   Take 60 mg by mouth 5 (five) times daily. Medication starts to wear off closer to 2.5 hours         . tolvaptan (SAMSCA) 15 MG TABS   Oral   Take 1 tablet (15 mg total) by mouth daily.   3 tablet   0     Generic ok   . zolpidem (AMBIEN) 10 MG tablet   Oral   Take 0.5 tablets (5 mg total) by mouth at bedtime.   30 tablet   5   . mupirocin ointment (BACTROBAN) 2 %   Topical   Apply topically 3 (three) times daily. Apply after warm soak for 10 minutes   30 g   0   . oxyCODONE (OXY IR/ROXICODONE) 5 MG immediate release tablet   Oral   Take 1 tablet (5 mg total) by mouth every 4 (four) hours as needed. For pain   120 tablet   0    BP 164/92  Pulse 76  Temp(Src) 97.1 F (36.2 C) (Oral)  Resp 16 Physical Exam  Nursing note and vitals reviewed. Constitutional: He is oriented to person, place, and time. He appears well-developed and well-nourished. No distress.  Eyes: EOM are normal.  Neck: Neck supple.  Cardiovascular: Normal rate and intact distal pulses.   Irregular cardiac rhythm suspect atrial fibrillation which is chronic.  Pulmonary/Chest: Effort normal. No respiratory distress.  Rare wheeze. Fair to good expansion.  Musculoskeletal: He exhibits edema and tenderness.  Chronic lower extremity edema from approximately midway from the knee to the foot and inferiorly to include the foot.  Lymphadenopathy:    He has no cervical adenopathy.   Neurological: He is alert and oriented to person, place, and time.  Skin: Skin is warm and dry.  There are weeping sores to the lower extremities. He appear to be similar to what was described in previous visits earlier this month. No evidence of infection. No lymphangitis or purulence.  Psychiatric: He has a normal mood and affect.    ED Course  Procedures (including critical care time) Labs Reviewed - No data to display No results found. 1. Venous stasis dermatitis, unspecified laterality   2. CHF (congestive heart failure), chronic, diastolic     MDM  This patient has a long and complex medical history to include chronic diastolic failure in association with systolic failure. He has hypertension, ADD, diabetes mellitus type 2, peripheral edema, chronic peripheral edema and venous stasis dermatitis, coronary artery disease, right bundle branch block, atrial fibrillation, myasthenia gravis and medical noncompliance. He is essentially stable today , he is requesting a new dressing be applied to his lower extremities where he has a clear draining sores from his penis stasis changes. He denies shortness of breath or chest pain.  Hayden Rasmussen, NP 12/04/12 screening examination/treatment/ procedure(s) were performed by non-physician practitioner and as supervising physician I was immediately available for consultations/colaborattion.   [employer] [place],M.D.  Duwayne Heck de [place], MD 12/04/12 [phone]

## 2012-12-04 NOTE — Assessment & Plan Note (Signed)
He continues to struggle with depression and anxiety.  WE will increase his remeron and we discussed SSRI therapy as well as counseling again today but he refuses.  HE denies SI, HI but states that if it is his time to go he is ready.  HE states that he has no access to guns.

## 2012-12-04 NOTE — ED Notes (Signed)
Complains of pain left and right left pain and edema. Sates wants to get new wrapping on it. Wants something for pain.

## 2012-12-04 NOTE — Assessment & Plan Note (Signed)
He is better today from a breathing stand point.  HE continues to have problems with marked fluid overload as evidenced in his leg swelling and JVD to the angle of his jaw.  He likely also has some mesenteric edema which may be affecting the absorption of the lasix.  We will switch him to torsemide 60 mg BID to see if he gets a more effective diuresis.  With his hyperkalemia we will stop his spironolactone and potassium supplement.  We will also hold his metolazone for now as well though we could add it back if he is still volume overloaded.

## 2012-12-04 NOTE — Assessment & Plan Note (Signed)
Creatinine looks good today so we will increase his diuresis to see if we can get more fluidoff.  He will follow up in 1 week to see how he is doing.

## 2012-12-04 NOTE — Assessment & Plan Note (Signed)
He continues to have marked pitting edema in both legs.  We will have him measured for compression stockings and wear them to help see if we can mobilize the fluid out of his legs and into the vascular system so we can diurese it off.

## 2012-12-04 NOTE — Assessment & Plan Note (Addendum)
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  He is finally in agreement to me starting Lantus. We will start at 20 U at night which is likely much less then he needs but will drop his blood sugar less then if we go with the full dose likely needed.  We will also start Novolog 5 U with each meal he eats.  I offered a referral to the diabetes educator but he states that he knows what he has to eat.

## 2012-12-04 NOTE — Assessment & Plan Note (Signed)
He was taken off his ACEi while in the hospital because of AKI and states that he is worried about his blood pressure.  BP Readings from Last 25 Encounters:  09/09/12 158/78  09/07/12 137/86  09/05/12 134/82   Blood pressure today is elevated.  His creatinine is improved and less then 2.5 so will restart his ACEi for his BP as well as to try to make sure he is no good medications for his heart failure.

## 2012-12-04 NOTE — Assessment & Plan Note (Addendum)
He continues to struggle with anxiety, depression, and insomnia.  We again discussed psychiatry referral but he declines.  He was taking entirely too much ambien when we tried that so we will increase the remeron from 15 mg to 30 mg at bedtime.

## 2012-12-04 NOTE — Assessment & Plan Note (Signed)
His creatinine on discharge was back to his baseline and with his proteinuria, diabetes, and heart failure he is a candidate for ACEi therapy.  He is a higher risk for it but the benefits likely out weigh the risks.  We will restart his benazepril today and recheck his creatinine at his next visit.

## 2012-12-04 NOTE — Assessment & Plan Note (Signed)
He states that he is back taking his lasix as of yesterday and feels better today without dizziness on standing.    BP Readings from Last 27 Encounters:  08/24/12 146/73  08/22/12 144/71  08/21/12 158/74   Blood pressure today is similar to his last visit.  We will continue to monitor.  His blood pressure is likely related very closely to his volume status.  Reinforced the low sodium diet and fluid restriction.

## 2012-12-04 NOTE — Assessment & Plan Note (Signed)
His creatinine has been increasing.  Differential includes cardiorenal syndrome vs progressive renal failure from his diabetes.  We discussed taking his medications as prescribed and again broached the topic of insulin though he is still resistant.

## 2012-12-04 NOTE — Assessment & Plan Note (Signed)
HE is still resistant to insulin as the treatment for his diabetes.  We had a long discussion today about controlling his blood sugars and how that my help his heart failure.  He is still hesitant.

## 2012-12-04 NOTE — Assessment & Plan Note (Signed)
Fluid status looks better today but he continues to be non-compliant with his low salt diet and fluid restriction.  I encouraged him to continue taking his medications as well as following his diet and fluid restriction.

## 2012-12-04 NOTE — Assessment & Plan Note (Signed)
Sodium (mEq/L)  Date Value  08/31/2012 133*  08/28/2012 125*  08/24/2012 128*   Sodium today is doing well.  I encouraged him to continue his medication as well as to keep to his low sodium and fluid restriction.

## 2012-12-07 ENCOUNTER — Ambulatory Visit: Payer: Medicare Other | Admitting: Internal Medicine

## 2012-12-12 ENCOUNTER — Ambulatory Visit (HOSPITAL_COMMUNITY): Payer: Medicaid Other

## 2013-01-06 DEATH — deceased

## 2013-05-24 IMAGING — CR DG CHEST 2V
2 series · 2 of 2 positions shown · non-contrast
Comparison: Prior chest x-ray 09/05/2012

CLINICAL DATA: Short of breath, lower extremity edema

CHEST - 2 VIEW

[w chest pa]
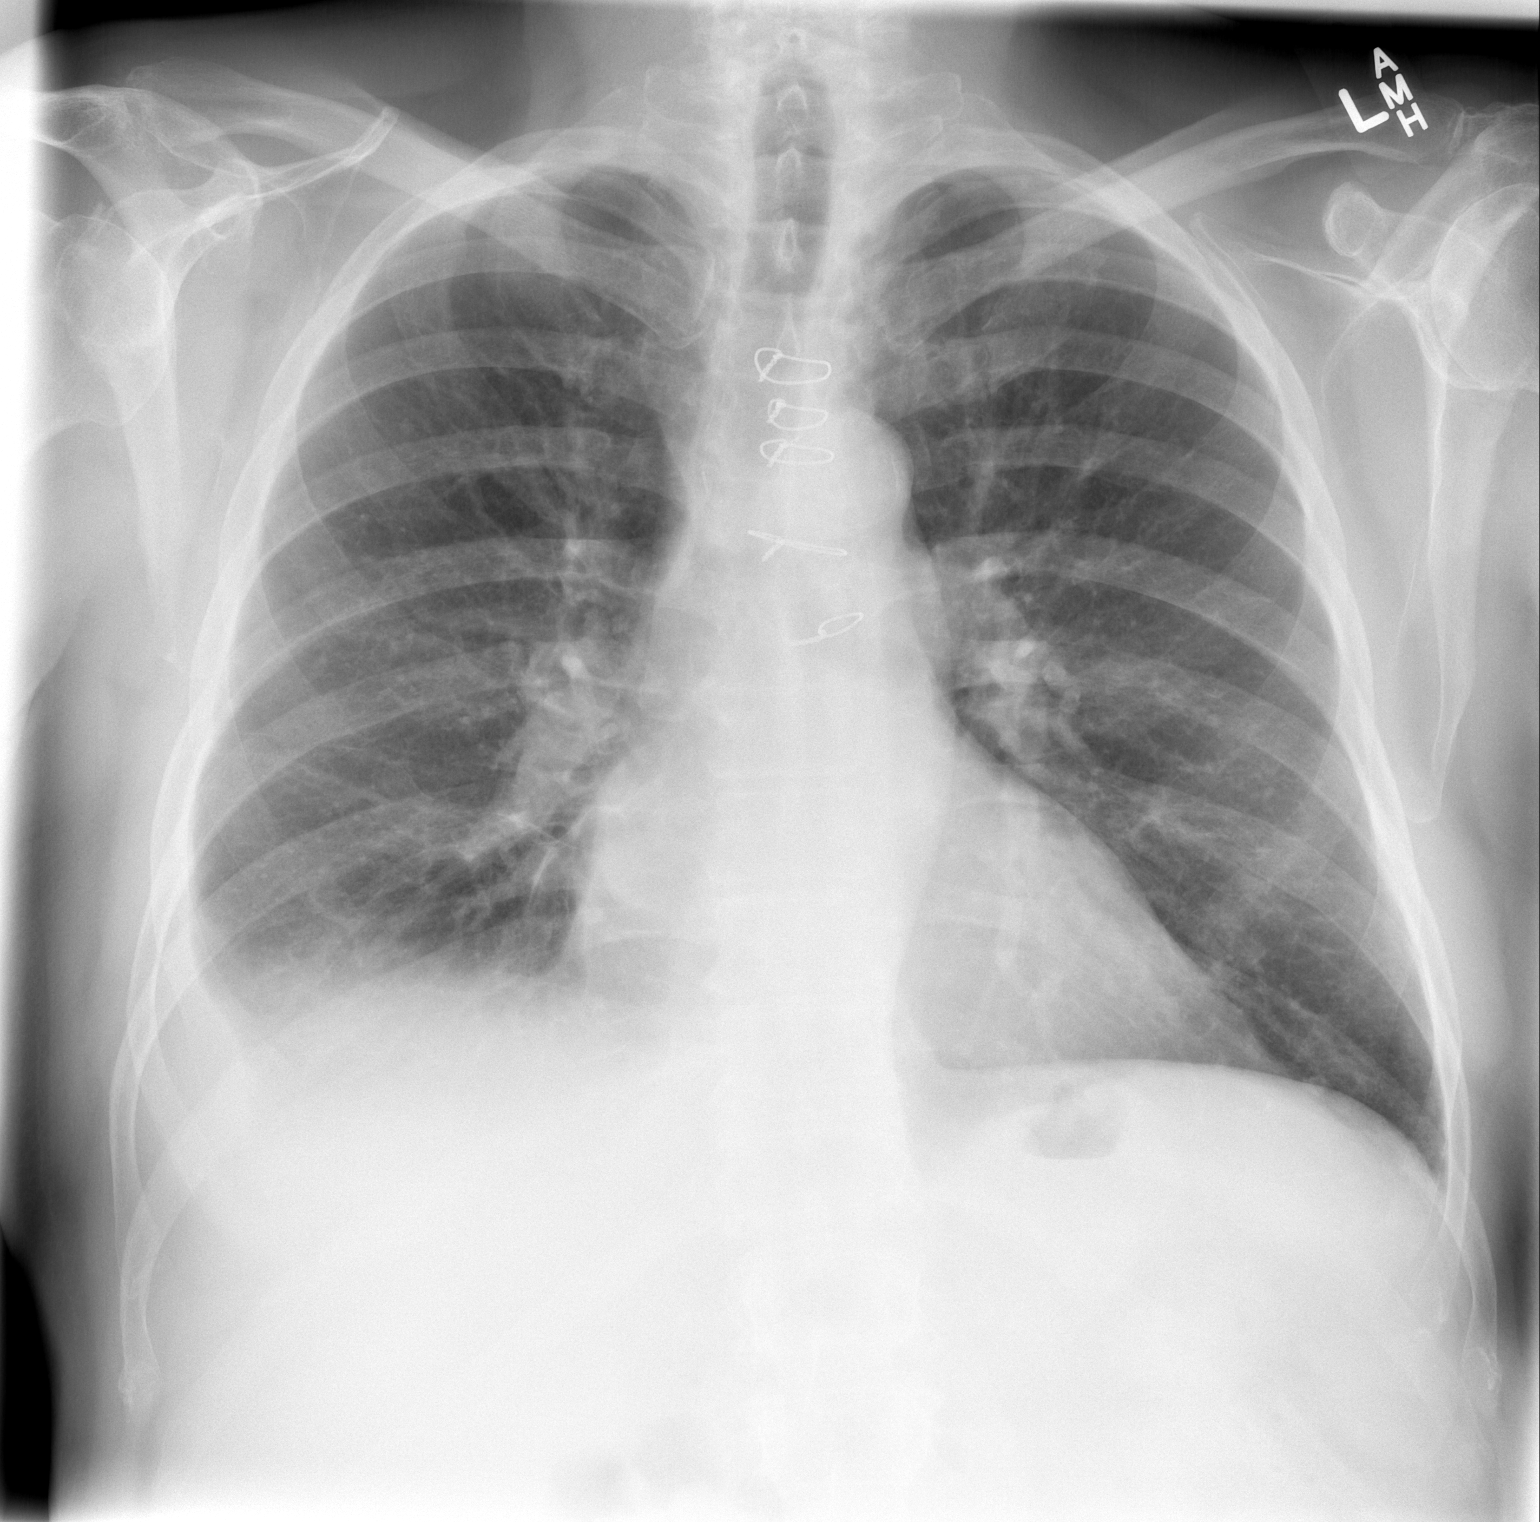

[w chest lat]
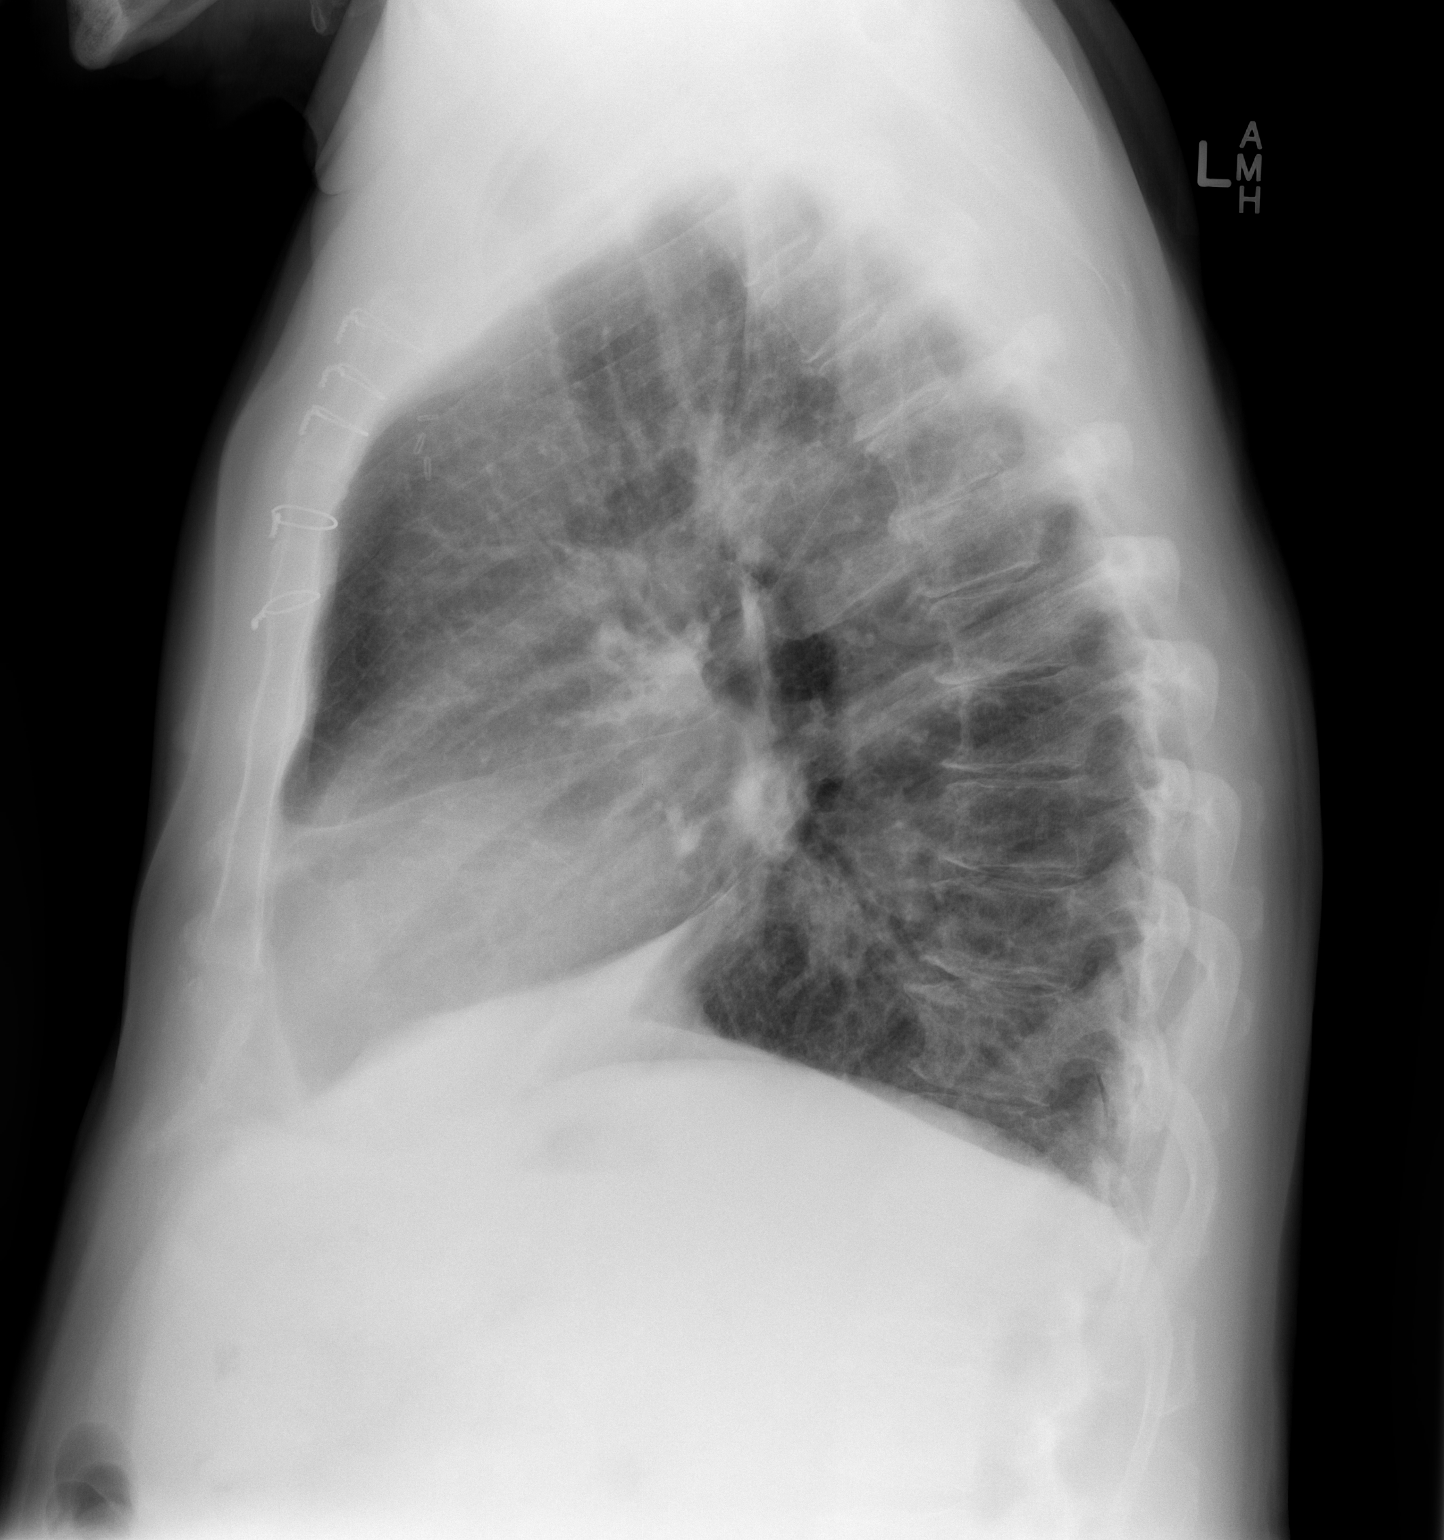

[2 of 2 positions shown; findings below may reference images not displayed]

FINDINGS: Stable to slightly enlarged right pleural effusion and
associated right basilar opacity.  There is pulmonary vascular
congestion without overt pulmonary edema.  Stable mild cardiomegaly
with left atrial enlargement.  Trace atherosclerotic calcification
in the thoracic aorta.  No acute osseous abnormality.
IMPRESSION: 1.  No acute cardiopulmonary disease.
2.  Stable to incrementally enlarged right pleural effusion and
associated basilar atelectasis.
3.  Mild vascular congestion without overt edema.
4.  Left atrial enlargement

## 2013-06-10 IMAGING — CR DG CHEST 2V
2 series · 2 of 2 positions shown · non-contrast
Comparison: 09/16/2012.

CLINICAL DATA: Short of breath.  Weight gain.  Fluid retention.

CHEST - 2 VIEW

[w chest pa]
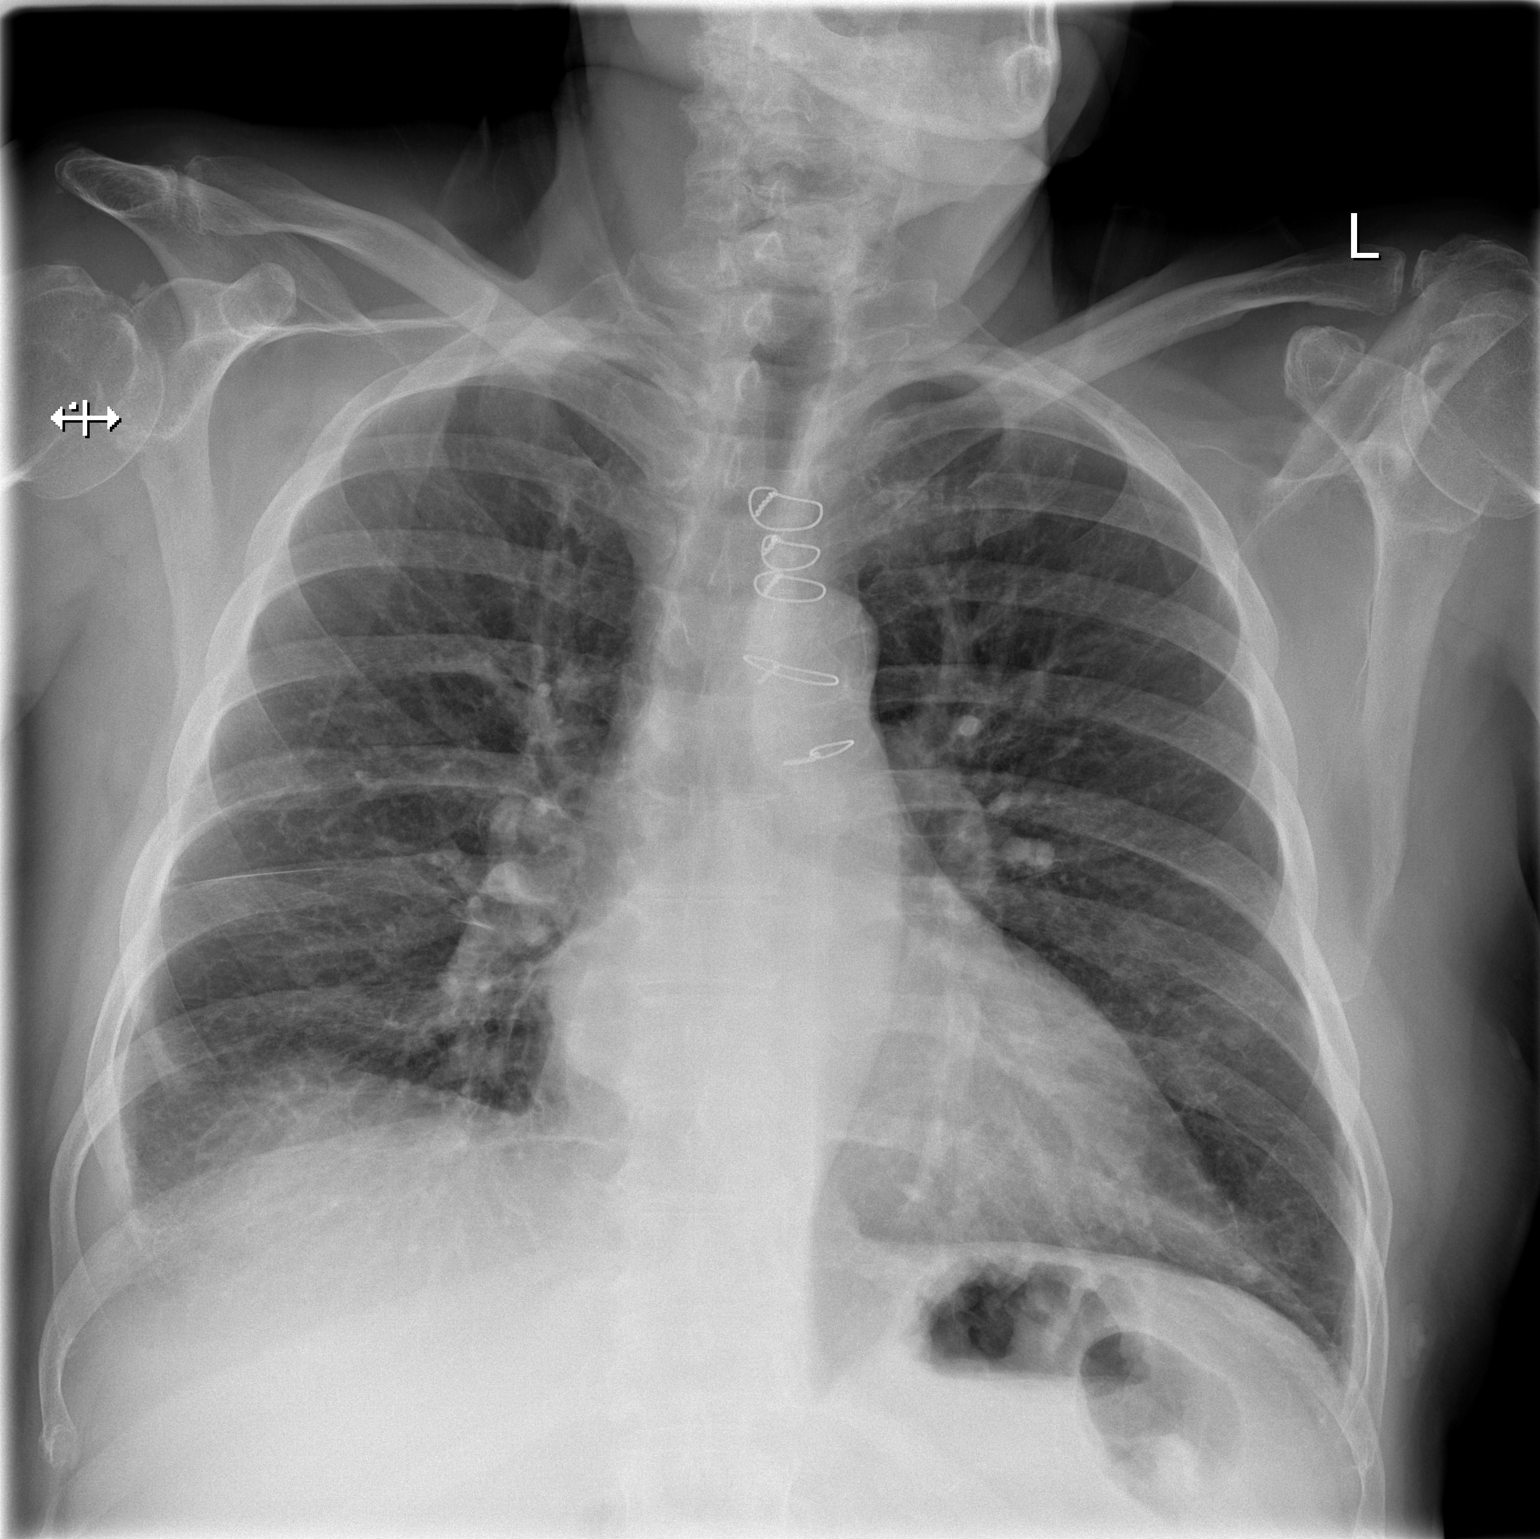

[w chest lat]
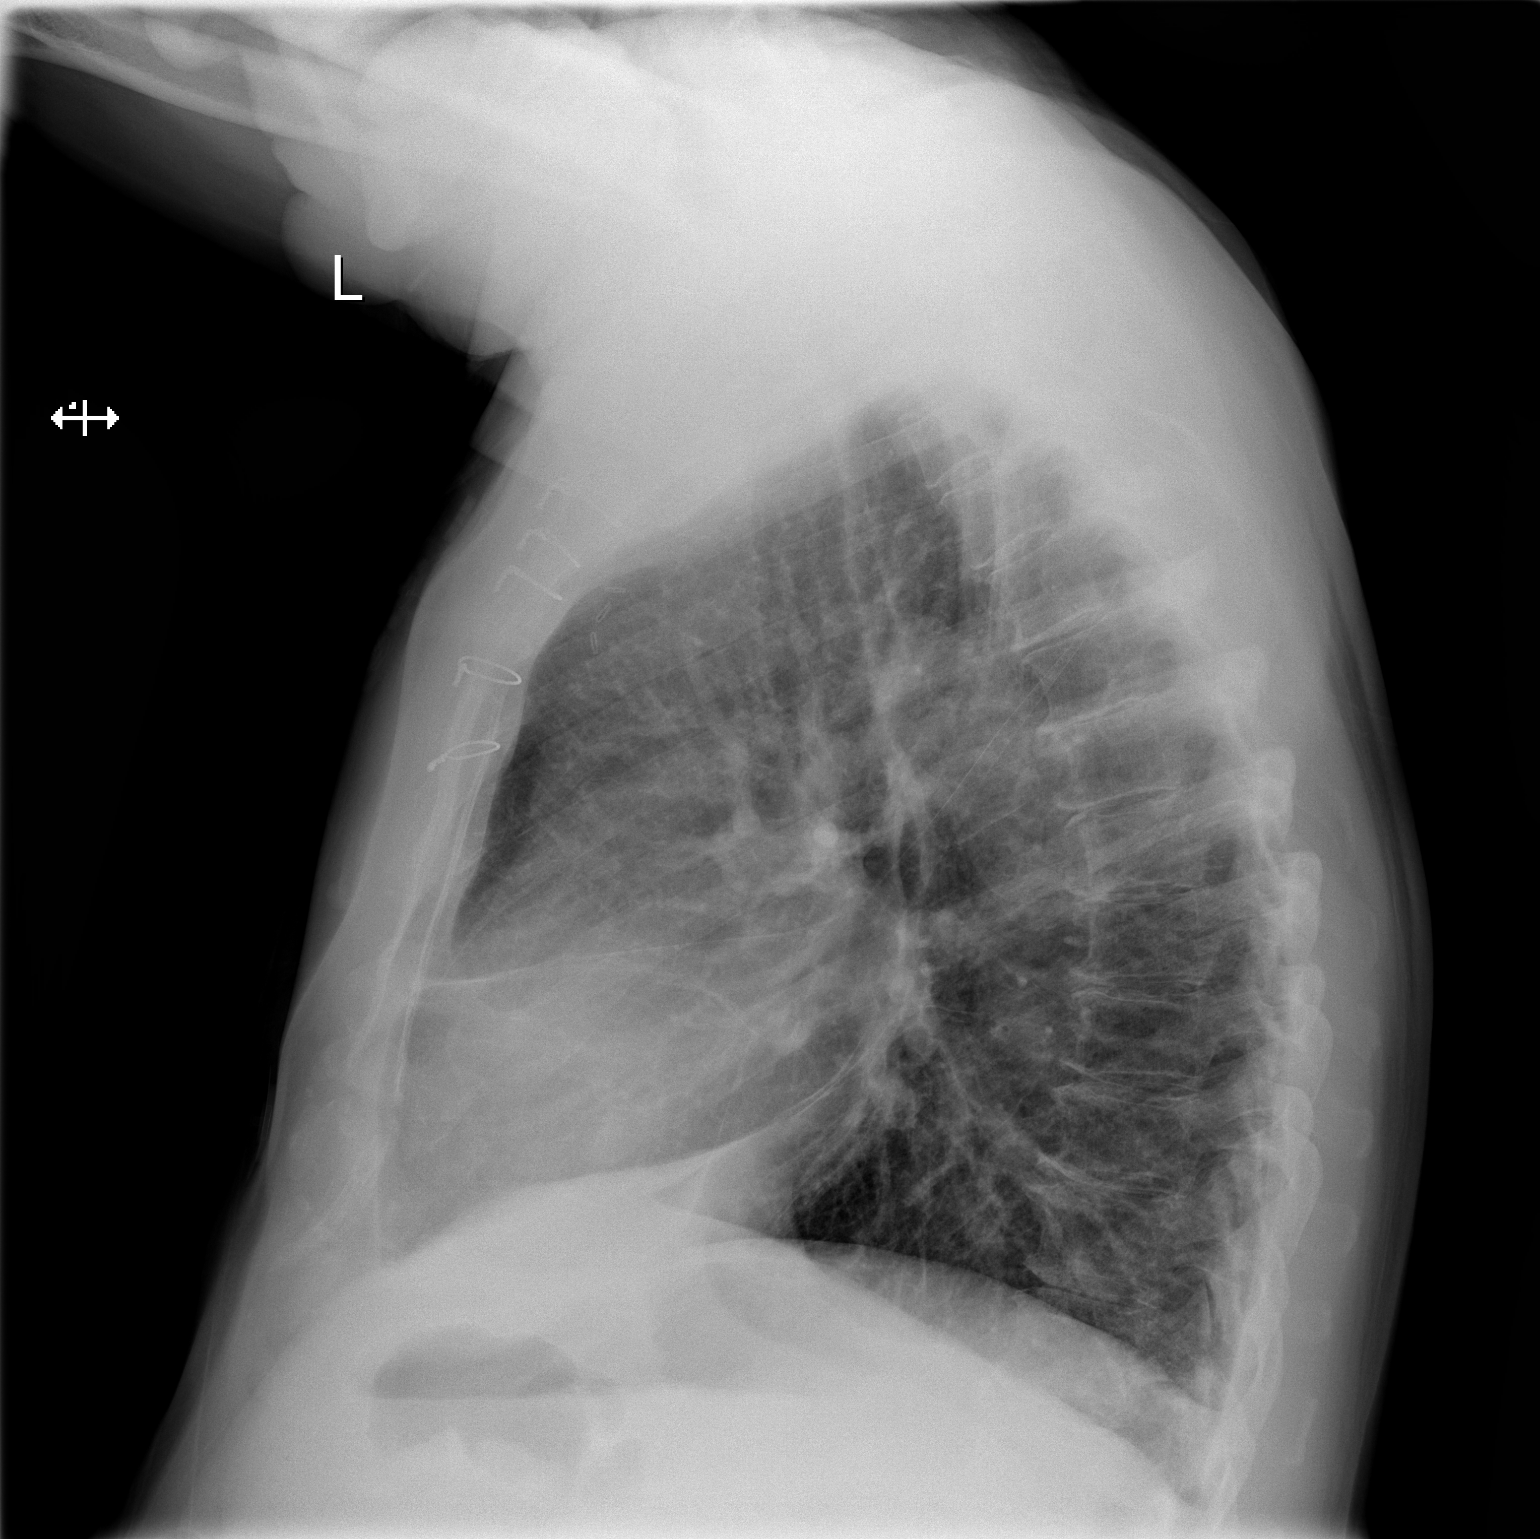

[2 of 2 positions shown; findings below may reference images not displayed]

FINDINGS: Median sternotomy wires are present.  Cardiopericardial
silhouette is within normal limits for projection.  Small right
pleural effusion is present.  Interstitial pulmonary edema is
present with Kerley B lines at the bases. No pneumothorax.
IMPRESSION: Mild CHF.

## 2013-12-21 NOTE — Telephone Encounter (Signed)
Close Ecounter

## 2014-09-17 NOTE — Telephone Encounter (Signed)
Entered in error
